# Patient Record
Sex: Female | Born: 1935
Health system: Southern US, Community
[De-identification: ages and names within clinical notes are randomized; demographics above are authoritative.]

## PROBLEM LIST (undated history)

## (undated) DIAGNOSIS — I5181 Takotsubo syndrome: Secondary | ICD-10-CM

## (undated) DIAGNOSIS — J189 Pneumonia, unspecified organism: Secondary | ICD-10-CM

## (undated) DIAGNOSIS — I2589 Other forms of chronic ischemic heart disease: Secondary | ICD-10-CM

## (undated) DIAGNOSIS — I484 Atypical atrial flutter: Secondary | ICD-10-CM

## (undated) DIAGNOSIS — I639 Cerebral infarction, unspecified: Secondary | ICD-10-CM

## (undated) DIAGNOSIS — I4891 Unspecified atrial fibrillation: Secondary | ICD-10-CM

## (undated) DIAGNOSIS — D638 Anemia in other chronic diseases classified elsewhere: Secondary | ICD-10-CM

## (undated) DIAGNOSIS — J90 Pleural effusion, not elsewhere classified: Secondary | ICD-10-CM

## (undated) DIAGNOSIS — I429 Cardiomyopathy, unspecified: Secondary | ICD-10-CM

## (undated) DIAGNOSIS — I2510 Atherosclerotic heart disease of native coronary artery without angina pectoris: Secondary | ICD-10-CM

## (undated) DIAGNOSIS — G894 Chronic pain syndrome: Secondary | ICD-10-CM

## (undated) DIAGNOSIS — Z9289 Personal history of other medical treatment: Secondary | ICD-10-CM

## (undated) DIAGNOSIS — E119 Type 2 diabetes mellitus without complications: Secondary | ICD-10-CM

## (undated) DIAGNOSIS — I5032 Chronic diastolic (congestive) heart failure: Secondary | ICD-10-CM

## (undated) DIAGNOSIS — I509 Heart failure, unspecified: Secondary | ICD-10-CM

## (undated) DIAGNOSIS — R0902 Hypoxemia: Secondary | ICD-10-CM

## (undated) DIAGNOSIS — N183 Chronic kidney disease, stage 3 unspecified: Secondary | ICD-10-CM

## (undated) DIAGNOSIS — I5021 Acute systolic (congestive) heart failure: Secondary | ICD-10-CM

## (undated) DIAGNOSIS — I2789 Other specified pulmonary heart diseases: Secondary | ICD-10-CM

## (undated) DIAGNOSIS — I129 Hypertensive chronic kidney disease with stage 1 through stage 4 chronic kidney disease, or unspecified chronic kidney disease: Secondary | ICD-10-CM

## (undated) DIAGNOSIS — I428 Other cardiomyopathies: Secondary | ICD-10-CM

## (undated) DIAGNOSIS — N189 Chronic kidney disease, unspecified: Secondary | ICD-10-CM

## (undated) HISTORY — DX: Acute systolic (congestive) heart failure: I50.21

## (undated) HISTORY — DX: Atypical atrial flutter: I48.4

## (undated) HISTORY — DX: Anemia in other chronic diseases classified elsewhere: D63.8

## (undated) HISTORY — PX: CHOLECYSTECTOMY: SHX55

## (undated) HISTORY — DX: Other forms of chronic ischemic heart disease: I25.89

## (undated) HISTORY — PX: ABDOMINAL HYSTERECTOMY: SHX81

## (undated) HISTORY — DX: Hypertensive chronic kidney disease with stage 1 through stage 4 chronic kidney disease, or unspecified chronic kidney disease: I12.9

## (undated) HISTORY — PX: PATELLA RECONSTRUCTION: SHX736

## (undated) HISTORY — DX: Atherosclerotic heart disease of native coronary artery without angina pectoris: I25.10

## (undated) HISTORY — PX: TONSILLECTOMY: SUR1361

## (undated) HISTORY — DX: Other cardiomyopathies: I42.8

## (undated) HISTORY — DX: Cardiomyopathy, unspecified: I42.9

## (undated) HISTORY — DX: Hypoxemia: R09.02

## (undated) HISTORY — DX: Pleural effusion, not elsewhere classified: J90

## (undated) HISTORY — DX: Cerebral infarction, unspecified: I63.9

## (undated) HISTORY — PX: APPENDECTOMY: SHX54

## (undated) HISTORY — PX: DILATION AND CURETTAGE OF UTERUS: SHX78

## (undated) HISTORY — DX: Chronic pain syndrome: G89.4

## (undated) HISTORY — PX: CATARACT EXTRACTION, BILATERAL: SHX1313

## (undated) HISTORY — DX: Heart failure, unspecified: I50.9

## (undated) HISTORY — DX: Other specified pulmonary heart diseases: I27.89

## (undated) HISTORY — DX: Chronic kidney disease, unspecified: N18.9

---
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
## 1959-05-17 DIAGNOSIS — Z9289 Personal history of other medical treatment: Secondary | ICD-10-CM

## 1959-05-17 HISTORY — DX: Personal history of other medical treatment: Z92.89

## 1998-07-31 ENCOUNTER — Ambulatory Visit (HOSPITAL_COMMUNITY): Admission: RE | Admit: 1998-07-31 | Discharge: 1998-07-31 | Payer: Self-pay | Admitting: *Deleted

## 2000-03-05 ENCOUNTER — Ambulatory Visit (HOSPITAL_COMMUNITY): Admission: RE | Admit: 2000-03-05 | Discharge: 2000-03-05 | Payer: Self-pay | Admitting: Gastroenterology

## 2000-12-30 ENCOUNTER — Emergency Department (HOSPITAL_COMMUNITY): Admission: EM | Admit: 2000-12-30 | Discharge: 2000-12-31 | Payer: Self-pay | Admitting: *Deleted

## 2000-12-30 ENCOUNTER — Encounter: Payer: Self-pay | Admitting: *Deleted

## 2001-01-07 ENCOUNTER — Encounter: Payer: Self-pay | Admitting: Family Medicine

## 2001-01-07 ENCOUNTER — Ambulatory Visit (HOSPITAL_COMMUNITY): Admission: RE | Admit: 2001-01-07 | Discharge: 2001-01-07 | Payer: Self-pay | Admitting: Family Medicine

## 2001-06-18 ENCOUNTER — Ambulatory Visit (HOSPITAL_COMMUNITY): Admission: RE | Admit: 2001-06-18 | Discharge: 2001-06-18 | Payer: Self-pay | Admitting: Family Medicine

## 2001-06-18 ENCOUNTER — Encounter: Payer: Self-pay | Admitting: Family Medicine

## 2002-01-31 ENCOUNTER — Ambulatory Visit (HOSPITAL_COMMUNITY): Admission: RE | Admit: 2002-01-31 | Discharge: 2002-01-31 | Payer: Self-pay | Admitting: Cardiology

## 2003-03-15 ENCOUNTER — Inpatient Hospital Stay (HOSPITAL_COMMUNITY): Admission: EM | Admit: 2003-03-15 | Discharge: 2003-03-17 | Payer: Self-pay | Admitting: Cardiology

## 2003-07-17 ENCOUNTER — Inpatient Hospital Stay (HOSPITAL_COMMUNITY): Admission: RE | Admit: 2003-07-17 | Discharge: 2003-07-18 | Payer: Self-pay | Admitting: Neurosurgery

## 2005-02-05 ENCOUNTER — Ambulatory Visit: Payer: Self-pay | Admitting: Cardiology

## 2005-02-06 ENCOUNTER — Inpatient Hospital Stay (HOSPITAL_COMMUNITY): Admission: AD | Admit: 2005-02-06 | Discharge: 2005-02-07 | Payer: Self-pay | Admitting: Cardiology

## 2005-02-13 ENCOUNTER — Ambulatory Visit: Payer: Self-pay | Admitting: *Deleted

## 2005-02-21 ENCOUNTER — Ambulatory Visit: Payer: Self-pay | Admitting: Cardiology

## 2005-03-06 ENCOUNTER — Ambulatory Visit: Payer: Self-pay | Admitting: Cardiology

## 2005-04-04 ENCOUNTER — Ambulatory Visit: Payer: Self-pay | Admitting: Cardiology

## 2006-05-04 ENCOUNTER — Ambulatory Visit: Payer: Self-pay | Admitting: Cardiology

## 2006-06-08 ENCOUNTER — Inpatient Hospital Stay (HOSPITAL_COMMUNITY): Admission: RE | Admit: 2006-06-08 | Discharge: 2006-06-15 | Payer: Self-pay | Admitting: Orthopedic Surgery

## 2006-06-11 ENCOUNTER — Ambulatory Visit: Payer: Self-pay | Admitting: Physical Medicine & Rehabilitation

## 2006-12-08 ENCOUNTER — Encounter: Admission: RE | Admit: 2006-12-08 | Discharge: 2006-12-08 | Payer: Self-pay | Admitting: Orthopedic Surgery

## 2007-03-04 ENCOUNTER — Ambulatory Visit: Payer: Self-pay | Admitting: Physician Assistant

## 2007-03-05 ENCOUNTER — Ambulatory Visit: Payer: Self-pay | Admitting: Cardiology

## 2007-03-05 ENCOUNTER — Inpatient Hospital Stay (HOSPITAL_BASED_OUTPATIENT_CLINIC_OR_DEPARTMENT_OTHER): Admission: RE | Admit: 2007-03-05 | Discharge: 2007-03-05 | Payer: Self-pay | Admitting: Cardiology

## 2008-04-27 ENCOUNTER — Observation Stay (HOSPITAL_COMMUNITY): Admission: RE | Admit: 2008-04-27 | Discharge: 2008-04-29 | Payer: Self-pay | Admitting: Orthopedic Surgery

## 2009-07-09 ENCOUNTER — Ambulatory Visit (HOSPITAL_COMMUNITY): Admission: RE | Admit: 2009-07-09 | Discharge: 2009-07-09 | Payer: Self-pay | Admitting: Pediatrics

## 2009-07-31 ENCOUNTER — Ambulatory Visit (HOSPITAL_COMMUNITY): Admission: RE | Admit: 2009-07-31 | Discharge: 2009-07-31 | Payer: Self-pay | Admitting: Pediatrics

## 2009-08-25 ENCOUNTER — Ambulatory Visit: Payer: Self-pay | Admitting: Cardiology

## 2009-10-05 ENCOUNTER — Ambulatory Visit (HOSPITAL_COMMUNITY): Admission: RE | Admit: 2009-10-05 | Discharge: 2009-10-05 | Payer: Self-pay | Admitting: Pediatrics

## 2009-10-05 ENCOUNTER — Other Ambulatory Visit: Admission: RE | Admit: 2009-10-05 | Discharge: 2009-10-05 | Payer: Self-pay | Admitting: Pediatrics

## 2010-11-18 ENCOUNTER — Ambulatory Visit: Payer: Self-pay | Admitting: Urgent Care

## 2010-11-22 DIAGNOSIS — I5023 Acute on chronic systolic (congestive) heart failure: Secondary | ICD-10-CM

## 2010-11-24 DIAGNOSIS — J90 Pleural effusion, not elsewhere classified: Secondary | ICD-10-CM

## 2010-11-25 DIAGNOSIS — I428 Other cardiomyopathies: Secondary | ICD-10-CM

## 2010-11-27 DIAGNOSIS — I509 Heart failure, unspecified: Secondary | ICD-10-CM

## 2010-11-28 DIAGNOSIS — I5032 Chronic diastolic (congestive) heart failure: Secondary | ICD-10-CM

## 2010-12-02 ENCOUNTER — Ambulatory Visit: Payer: Self-pay | Admitting: Gastroenterology

## 2010-12-02 ENCOUNTER — Encounter: Payer: Self-pay | Admitting: Cardiology

## 2010-12-04 ENCOUNTER — Encounter: Payer: Self-pay | Admitting: *Deleted

## 2010-12-04 ENCOUNTER — Encounter: Payer: Self-pay | Admitting: Cardiology

## 2010-12-04 ENCOUNTER — Ambulatory Visit (INDEPENDENT_AMBULATORY_CARE_PROVIDER_SITE_OTHER): Payer: Medicare Other | Admitting: *Deleted

## 2010-12-04 DIAGNOSIS — I420 Dilated cardiomyopathy: Secondary | ICD-10-CM | POA: Insufficient documentation

## 2010-12-04 DIAGNOSIS — I2589 Other forms of chronic ischemic heart disease: Secondary | ICD-10-CM

## 2010-12-04 DIAGNOSIS — I255 Ischemic cardiomyopathy: Secondary | ICD-10-CM

## 2010-12-04 DIAGNOSIS — I959 Hypotension, unspecified: Secondary | ICD-10-CM | POA: Insufficient documentation

## 2010-12-04 DIAGNOSIS — I272 Pulmonary hypertension, unspecified: Secondary | ICD-10-CM | POA: Insufficient documentation

## 2010-12-04 DIAGNOSIS — I2510 Atherosclerotic heart disease of native coronary artery without angina pectoris: Secondary | ICD-10-CM | POA: Insufficient documentation

## 2010-12-04 DIAGNOSIS — I509 Heart failure, unspecified: Secondary | ICD-10-CM

## 2010-12-04 DIAGNOSIS — I2789 Other specified pulmonary heart diseases: Secondary | ICD-10-CM

## 2010-12-04 DIAGNOSIS — R9431 Abnormal electrocardiogram [ECG] [EKG]: Secondary | ICD-10-CM

## 2010-12-04 MED ORDER — FUROSEMIDE 40 MG PO TABS: 40.0000 mg | ORAL_TABLET | Freq: Every day | ORAL | Status: DC

## 2010-12-04 MED ORDER — DIGOXIN 125 MCG PO TABS: 125.0000 ug | ORAL_TABLET | Freq: Every day | ORAL | Status: DC

## 2010-12-04 MED ORDER — CARVEDILOL 3.125 MG PO TABS: 3.1250 mg | ORAL_TABLET | Freq: Two times a day (BID) | ORAL | Status: DC

## 2010-12-04 NOTE — Assessment & Plan Note (Addendum)
Patient is stable.  She is doing much better after intravenous dobutamine.  However she has an end-stage cardiomyopathy and at this point in time we will continue to follow her optimize her medications.  I do not think that she is a particular good candidate for ICD given the fact that she had a recent heart failure exacerbation in his NYHA class 4.  Her EKG Also does not qualify her for CRT P.

## 2010-12-04 NOTE — Assessment & Plan Note (Addendum)
Will check orthostatics.  Although the patient is not orthostatic she is complaining of dizziness and I have decreased her dose of lisinopril to once a day.

## 2010-12-04 NOTE — Assessment & Plan Note (Addendum)
Volume status stable.  Currently no change in medications.  Continue current dose of Lasix.  The patient will also be set up for home health in case changes in her diuretic regimen need to be obtained.  She also needs daily weights at home.

## 2010-12-04 NOTE — Progress Notes (Signed)
HPI The patient is a 77 year old female who was admitted to [facility] with nausea and vomiting secondary to low output state.  The patient received intravenous dobutamine.  She had a very good response to this.  She presented with acute on chronic renal insufficiency.  Her creatinine improved from 2.54 to 1.54. The patient has a mixed ischemic/nonischemic cardiomyopathy by catheterization in 2008.  Her current ejection fraction is 20 to 25%.  Her Cardiolite study done showed moderately severe decreased activity in the inferior wall with no reversibility consistent with an old inferior wall myocardial infarction.  There was also decreased activity in the mid anterior wall and entire apex with no reversibility.  Overall the study shows old scar affecting the inferior and anterior apical wall with no significant ischemia. The patient has a significant abnormal baseline electrocardiogram T-wave inversions in the inferior and anterolateral leads. Of note is that the patient also had a gastric emptying study done for nausea which was negative.  Ventilation-perfusion scan was low probability of pulmonary embolism. Echocardiogram also showed significant pulmonary hypertension with PA systolic pressure of 70 to 75 mm of mercury. The patient presents now for follow-up.  Plan was for home health to draw a electrolyte panel but this was never done.  Also a number of her medications on discharge were incorrect and she was not taking some cardiac medications be recommended.  Although the patient is still weak she has improved significantly.  Her blood pressure however is the low side likely due to the fact that she is a very high dose of lisinopril which is ultimately recommended that.  The patient will be checked for orthostatics today.  No Known Allergies  No current outpatient prescriptions on file prior to visit.    No past medical history on file.  No past surgical history on file.  No family history  on file.  History   Social History  . Marital Status: Married    Spouse Name: N/A    Number of Children: N/A  . Years of Education: N/A   Occupational History  . Not on file.   Social History Main Topics  . Smoking status: Never Smoker   . Smokeless tobacco: Not on file  . Alcohol Use: No  . Drug Use: No  . Sexually Active: Not on file   Other Topics Concern  . Not on file   Social History Narrative  . No narrative on file   Review of systems The patient still reports some weakness and fatigue.She is less short of breath rashes or gaining her strength.  She has no palpitations or syncope orthopnea or PND.  The remainder of the aching point review of systems is otherwise within normal limits.  PHYSICAL EXAM BP 99/68  Pulse 69  Ht 5\' 2"  (1.575 m)  Wt 137 lb (62.143 kg)  BMI 25.06 kg/m2  SpO2 98% General: Well-developed, well-nourished in no distress Head: Normocephalic and atraumatic Eyes:PERRLA/EOMI intact, conjunctiva and lids normal Ears: No deformity or lesions Mouth:normal dentition, normal posterior pharynx Neck: Supple, no JVD.  No masses, thyromegaly or abnormal cervical nodes Lungs: Normal breath sounds bilaterally without wheezing.  Normal percussion Cardiac: regular rate and rhythm with normal S1 and S2, no S3 or S4.  PMI is normal.  No pathological murmurs Abdomen: Normal bowel sounds, abdomen is soft and nontender without masses, organomegaly or hernias noted.  No hepatosplenomegaly MSK: Back normal, normal gait muscle strength and tone normal Vascular: Pulse is normal in all 4 extremities  Extremities: No peripheral pitting edema Neurologic: Alert and oriented x 3 Skin: Intact without lesions or rashes Lymphatics: No significant adenopathyPsychologic: Normal affect  [id number] sinus rhythm, marked ST-T wave changes in inferior and anterior leads which are old finding.  ASSESSMENT AND PLAN

## 2010-12-04 NOTE — Patient Instructions (Signed)
Home Health - deferred to primary MD Labs:  BMET today Cardiac Medications:     Digoxin 0.125mg  - every other day   Lisinopril 2.5mg  daily   Lasix 40mg  daily   Carvedilol 3.125mg  - twice a day  Follow up in  3-4 weeks.

## 2010-12-04 NOTE — Assessment & Plan Note (Signed)
Will check electrolyte panel.

## 2010-12-06 ENCOUNTER — Other Ambulatory Visit: Payer: Self-pay | Admitting: Cardiology

## 2010-12-06 ENCOUNTER — Other Ambulatory Visit: Payer: Self-pay | Admitting: *Deleted

## 2010-12-06 DIAGNOSIS — E875 Hyperkalemia: Secondary | ICD-10-CM

## 2010-12-12 ENCOUNTER — Telehealth: Payer: Self-pay | Admitting: *Deleted

## 2010-12-12 NOTE — Telephone Encounter (Signed)
Left message for daughter to call back regarding test results.

## 2010-12-12 NOTE — Consult Note (Signed)
Summary: [facility] PROGRESS NOTE  [facility]IOLOGY[facility] PROGRESS NOTE   Imported By: Delfino Lovett 12/02/2010 15:05:23  _____________________________________________________________________  External Attachment:    Type:   Image     Comment:   External Document

## 2010-12-12 NOTE — Telephone Encounter (Signed)
Message copied by Lovina Reach on Thu Dec 12, 2010  3:01 PM ------      Message from: Terald Sleeper      Created: Wed Dec 11, 2010  1:44 PM       Continue to hold lisinopril

## 2010-12-13 ENCOUNTER — Encounter: Payer: Self-pay | Admitting: *Deleted

## 2010-12-13 NOTE — Telephone Encounter (Signed)
Patients daughter notified of results on BMET.  She is aware to continue to hold her Lisinopril.

## 2010-12-17 ENCOUNTER — Ambulatory Visit (INDEPENDENT_AMBULATORY_CARE_PROVIDER_SITE_OTHER): Payer: Self-pay | Admitting: Internal Medicine

## 2011-01-03 ENCOUNTER — Ambulatory Visit (INDEPENDENT_AMBULATORY_CARE_PROVIDER_SITE_OTHER): Payer: BC Managed Care – PPO | Admitting: Cardiology

## 2011-01-03 ENCOUNTER — Encounter: Payer: Self-pay | Admitting: Cardiology

## 2011-01-03 VITALS — BP 89/56 | HR 61

## 2011-01-03 DIAGNOSIS — I420 Dilated cardiomyopathy: Secondary | ICD-10-CM

## 2011-01-03 DIAGNOSIS — I959 Hypotension, unspecified: Secondary | ICD-10-CM

## 2011-01-03 DIAGNOSIS — I272 Pulmonary hypertension, unspecified: Secondary | ICD-10-CM

## 2011-01-03 DIAGNOSIS — I2510 Atherosclerotic heart disease of native coronary artery without angina pectoris: Secondary | ICD-10-CM

## 2011-01-03 DIAGNOSIS — R112 Nausea with vomiting, unspecified: Secondary | ICD-10-CM | POA: Insufficient documentation

## 2011-01-03 DIAGNOSIS — I428 Other cardiomyopathies: Secondary | ICD-10-CM

## 2011-01-03 DIAGNOSIS — I2789 Other specified pulmonary heart diseases: Secondary | ICD-10-CM

## 2011-01-03 NOTE — Patient Instructions (Signed)
Continue all current medications. Your physician wants you to follow up in:  3 months.  You will receive a reminder letter in the mail one-two months in advance.  If you don't receive a letter, please call our office to schedule the follow up appointment

## 2011-01-03 NOTE — Assessment & Plan Note (Signed)
The patient also in the past that had abnormal Cardiolite studies consistent with false positive studies. I think for the most part she does not have ischemic heart disease. She currently denies any chest pain and no further workup is required.

## 2011-01-03 NOTE — Assessment & Plan Note (Signed)
Creatinine has remained stable around 1.6. However we did not restart lisinopril due to the fact that her blood pressure is relatively low.

## 2011-01-03 NOTE — Assessment & Plan Note (Signed)
Lisinopril and Aldactone have been put on hold. Although blood pressures relatively low the patient is asymptomatic. Aldactone should not be restarted due to associated hyperkalemia.

## 2011-01-03 NOTE — Progress Notes (Signed)
HPI The patient is a 77 year old female was recently admitted to [facility] with nausea and vomiting. She was ruled out for gastroparesis. She had a negative GERD. It was felt that the right lower lobe infiltrate consistent with pneumonia. The patient has had multiple cardiac catheterizations in the past her most recent one in 2008 which showed no significant coronary artery disease. In the past she does have normal LV function. However during his hospitalization on admission her heart function was found to be severely depressed. She had new onset LV dysfunction with an ejection fraction of 20-25%. She was also found to have severe pulmonary hypertension with PA systolic pressures of 123XX123 mm of mercury. In addition she had developed acute on chronic renal insufficiency with a creatinine that went as high as 2.54. However upon discharge her creatinine was 1.5. The patient's was transferred to the ICU for intravenous dobutamine. I should mention also that the patient had a recent Cardiolite study done which showed inferior scar but no ischemia. She was ruled out for pulmonary embolism and had a negative ventilation perfusion scan. She also had a pleural effusion during this admission. The patient improved with intravenous dobutamine with improvement in her creatinine and symptoms of low output. It was eventually felt that her nausea and vomiting was secondary to low output symptoms. The patient's BNP level improved from 1597 to 364. Multiple medications changes were made to digoxin was added at 0.125 mg a day, lisinopril 2.5 g by mouth twice a day, Lasix 40 mg a day, spironolactone 25 mg a day and carvedilol 3.125 g by mouth twice a day. Upon discharge we ordered followup labs in particular to followup with her creatinine. Of note is also the patient had an anemia and had fecal occult blood screen that was positive. However iron parameters were more consistent with anemia of chronic disease/inflammatory  anemia. Followup blood work however showed that the patient had a creatinine of 1.64 and a potassium of 5.4 and therefore we made a decision to hold the lisinopril and spironolactone. Followup laboratory work showed a potassium of 4.6 and stable. I did a bedside echocardiogram today in the office and the patient's ejection fraction has dramatically improved to 40/45%. This is a significant change from her recent hospitalization. I'm not sure if the patient could have had aTacko-tsubo syndrome although there is no history to support any risk factors for this condition.  No Known Allergies  Current Outpatient Prescriptions on File Prior to Visit  Medication Sig Dispense Refill  . carvedilol (COREG) 3.125 MG tablet Take 1 tablet (3.125 mg total) by mouth 2 (two) times daily with a meal.  60 tablet  6  . digoxin (LANOXIN) 0.125 MG tablet Take 1 tablet (125 mcg total) by mouth daily.  30 tablet  11  . FLUoxetine (PROZAC) 20 MG capsule Take one by mouth three times daily       . furosemide (LASIX) 40 MG tablet Take 1 tablet (40 mg total) by mouth daily.  30 tablet  11  . glipiZIDE (GLUCOTROL) 5 MG tablet Take one by mouth daily       . insulin detemir (LEVEMIR) 100 UNIT/ML injection Inject 14 Units into the skin at bedtime.       Marland Kitchen oxybutynin (DITROPAN) 5 MG tablet Take one by mouth twice daily       . oxyCODONE (OXYCONTIN) 10 MG 12 hr tablet Take 1-2 by mouth four times daily as needed for severe pain        .  PLAVIX 75 MG tablet Take one by mouth daily       . prochlorperazine (COMPAZINE) 5 MG tablet Take 5 mg by mouth every 6 (six) hours as needed.       . simvastatin (ZOCOR) 20 MG tablet Take one by mouth daily       . DISCONTD: lisinopril (PRINIVIL,ZESTRIL) 2.5 MG tablet Take one by mouth daily        . DISCONTD: spironolactone (ALDACTONE) 25 MG tablet Take one by mouth daily         Past Medical History  Diagnosis Date  . Unspecified pleural effusion   . Acute systolic heart failure   .  Other primary cardiomyopathies   . Congestive heart failure, unspecified   . Other specified forms of chronic ischemic heart disease   . Coronary atherosclerosis of native coronary artery   . Unspecified hypertensive kidney disease with chronic kidney disease stage I through stage IV, or unspecified   . Type II or unspecified type diabetes mellitus with neurological manifestations, uncontrolled   . Chronic kidney disease, unspecified   . Anemia of other chronic disease   . Other chronic pulmonary heart diseases   . Hypoxemia   . Chronic pain syndrome     Past Surgical History  Procedure Date  . Cholecystectomy   . Appendectomy   . Abdominal hysterectomy   . Bilateral knee surgery   . Cataract extraction, bilateral   . Tonsillectomy     Family History  Problem Relation Age of Onset  . Heart attack Father 72    MI    History   Social History  . Marital Status: Married    Spouse Name: N/A    Number of Children: N/A  . Years of Education: N/A   Occupational History  . RETIRED    Social History Main Topics  . Smoking status: Never Smoker   . Smokeless tobacco: Not on file  . Alcohol Use: No  . Drug Use: No  . Sexually Active: Not on file   Other Topics Concern  . Not on file   Social History Narrative  . No narrative on file    review of systems:Pertinent positives as outlined above. The remainder of the 18  point review of systems is negative   PHYSICAL EXAM BP 89/56  Pulse 61  General: Well-developed, well-nourished in no distress Head: Normocephalic and atraumatic Eyes:PERRLA/EOMI intact, conjunctiva and lids normal Ears: No deformity or lesions Mouth:normal dentition, normal posterior pharynx Neck: Supple, no JVD.  No masses, thyromegaly or abnormal cervical nodes Lungs: Normal breath sounds bilaterally without wheezing.  Normal percussion Cardiac: regular rate and rhythm with normal S1 and S2, no S3 or S4.  PMI is normal.  No pathological  murmurs Abdomen: Normal bowel sounds, abdomen is soft and nontender without masses, organomegaly or hernias noted.  No hepatosplenomegaly MSK: Back normal, normal gait muscle strength and tone normal Vascular: Pulse is normal in all 4 extremities Extremities: No peripheral pitting edema Neurologic: Alert and oriented x 3, generalized weakness Skin: Intact without lesions or rashes Lymphatics: No significant adenopathy Psychologic: Normal affect   ECG: Not available  ASSESSMENT AND PLAN

## 2011-01-03 NOTE — Assessment & Plan Note (Signed)
Possibly Tacko-tsubo cardiomyopathy. During her last admission the patient was given intravenous dobutamine. I did a bedside echocardiogram today and it demonstrates that the patient's LV function has dramatically improved to 40-45%. She can continue on her current medical regimen.

## 2011-01-28 NOTE — Assessment & Plan Note (Signed)
[place] OFFICE NOTE   NAME:[name], Norma MALONY                      MRN:          [id number]  DATE:03/04/2007                            DOB:          08-10-1934    CARDIOLOGIST:  Dr. Dannielle Burn.   PRIMARY CARE PHYSICIAN:  Dr. Jerene Bears.   HISTORY OF PRESENT ILLNESS:  Norma [name] is a 77 year old female patient  with a history of non obstructive coronary artery disease by cardiac  catheterization. She has actually had multiple cardiac catheterization  dating back to 1999. She has had chronic chest pain since that time as  well. In total she has had 4 cardiac catheterizations all revealing non  obstructive coronary artery disease. She had been treated with Imdur in  the past but that was discontinued in 2006. She last saw Norma Serpe, PA-  C in the office on May 04, 2006. She noted chronic stable angina  pectoris at that time and she was cleared for upcoming knee surgery. The  knee surgery apparently went well. She was scheduled for lumbar spine  surgery recently, however she noted increasing chest pain and her  surgery was postponed. She presents to the office today for cardiac  clearance. She notes that she gets left-sided chest pressure underneath  her breast, mainly with exertion. This happens with any type of activity  she does. She sometimes has to lie down for the entire day and not do  anything to remain pain free. She has had on occasion chest pain at  rest. However, as noted previously this has mainly been with exertion.  She notes associated shortness of breath. She also notes associated  nausea. There is no diaphoresis or syncope. She denies any radiation to  her neck or arm, but does feel some radiation to her back from time to  time. She denies orthopnea, paroxysmal nocturnal dyspnea, pedal edema,  or palpitations.   MEDICATIONS:  1. Toprol XL 50 mg daily.  2. Aspirin 81 mg daily.  3. NovoLog  insulin 70/30 as directed.  4. Metformin 1 gram b.i.d.  5. Xanax 0.5 mg at bedtime.  6. Hydrocodone p.r.n.   ALLERGIES:  No known drug allergies.   SOCIAL HISTORY:  She denies any tobacco abuse.   FAMILY HISTORY:  Significant for coronary artery disease. Her father  died of myocardial infarction at age 28.   REVIEW OF SYSTEMS:  Please see HPI. Denies any fevers, chills, cough. No  melena, hematochezia, hematuria, dysuria. The rest of review of systems  is negative.   PHYSICAL EXAMINATION:  She is a well-nourished, well-developed female in  no acute distress. Blood pressure 154/84, pulse 80, weight 195.6 pounds.  HEENT: Normal.  NECK:  Without JVD.  ENDOCRINE: Without thyromegaly. Carotids without bruits bilaterally.  CARDIAC: Normal S1, S2. Regular rate and rhythm without murmurs.  LUNGS: Clear to auscultation bilaterally without wheezing, rhonchi, or  rales.  ABDOMEN: Soft, nontender, with normal bowel sounds. No organomegaly.  EXTREMITIES: Without edema. Calves soft, nontender.  SKIN: Warm and dry.  NEUROLOGIC: She is alert and oriented x3. Cranial nerves II-XII  grossly  intact. Femoral artery pulses are 2+ bilaterally without bruits.   Electrocardiogram reveals sinus rhythm with a heart rate of 61. Normal  axis. No acute changes.   IMPRESSION:  1. Chest discomfort, consistent with exertional angina pectoris.  2. Non obstructive coronary artery disease by cardiac catheterization.      a.     Multiple cardiac catheterizations in the past.      b.     Last cardiac catheterization May of 2006: Mid LAD 40% to       50%, first diagonal 30% to 40% discrete lesions,circumflex 20%,       multiple discrete lesions proximally, AV grooves circumflex 60%       ostial stenosis, RCA 20% to 30% multiple discrete lesions of the       proximal mid vessel, PDA 30% multiple discrete lesions.  3. Preserved left ventricular function EF 60% at cardiac      catheterization 2006.  4. Diabetes  mellitus.  5. Hypertension.  6. Hyperlipidemia, untreated.  7. Lumbar spine disease.      a.     Needs surgery.  8. Prior history of TIA.  9. Prior history of abnormal chest CT.      a.     Right base air cavity with 2 small nodular densities by CT       scan July 2004.  10.Osteoarthritis.      a.     Status post bilateral knee replacements in the past.   PLAN:  Patient presents to the office today for preoperative clearance  for upcoming back surgery. She actually describes symptoms of her  exertional angina pectoris. This seems to have gotten worse over the  last several weeks. She has no changes on her EKG today. She has had non  obstructive coronary disease in the past. There has been a concern in  the past that she may have microvascular disease contributing to some of  her symptoms. Unfortunately, she is not on a calcium channel blocker or  nitrate at this point in time. I have recommended we go ahead get her on  Norvasc 5 mg a day.  I have also given her a prescription for p.r.n.  nitroglycerine. She is to remain on her aspirin. We will get her set up  for cardiac catheterization in the outpatient lab in [place].  Hopefully this can be done tomorrow or Monday of next week. She knows to  go to the emergency room should she have any worsening of symptoms, or  change in her symptoms. I discussed the above assessment and plan today  with Dr. Dannielle Burn  who agreed. We will see her back after her cardiac catheterization and  make final recommendations regarding surgery.      Richardson Dopp, PA-C  Electronically Signed      Ernestine Mcmurray, MD,FACC  Electronically Signed   SW/MedQ  DD: 03/04/2007  DT: 03/04/2007  Job #: [id number]   cc:   Jerene Bears

## 2011-01-28 NOTE — Cardiovascular Report (Signed)
NAMELAENA, [name] NO.:  [id number]   MEDICAL RECORD NO.:  [id number]          PATIENT TYPE:  OIB   LOCATION:  1963                         FACILITY:  [facility]   PHYSICIAN:  Ethelle Lyon, MD  DATE OF BIRTH:  [birth year]   DATE OF PROCEDURE:  03/05/2007  DATE OF DISCHARGE:  03/05/2007                            CARDIAC CATHETERIZATION   PROCEDURES:  1. Left heart catheterization.  2. Left ventriculography.  3. Coronary angiography.  4. Abdominal aortography.   INDICATIONS:  [name] is a 77 year old woman who has a long history  of noncardiac chest pain.  She has had multiple cardiac catheterizations  dating back to 1999.  A question has been raised of coronary spasm in  the past.  She is now contemplating lumbar spinal surgery and has had  more chest pain than usual.  Based on this, she was referred for a  diagnostic angiography.   PROCEDURAL TECHNIQUE:  Informed consent was obtained.  Under 1%  lidocaine local anesthesia, a 4-French sheath was placed in the right  common femoral artery using the modified Seldinger technique.  Diagnostic angiography and ventriculography were performed using JL-4,  JR-4, and pigtail catheters.  The pigtail catheter was then pulled back  to the suprarenal abdominal aorta.  Abdominal aortography was performed  by power injection.  The sheath was then removed and the patient  transferred to the holding room in stable condition, having tolerated  the procedure well.   COMPLICATIONS:  None.   FINDINGS:  1. LV:  183/8/15.  EF 65% without regional wall motion abnormality.  2. No aortic stenosis or mitral regurgitation.  3. Renal arteries:  Single vessels bilaterally, both of which are      normal.  4. Left main:  Angiographically normal.  5. LAD:  Moderate-sized vessel giving rise to a small first and      moderate-sized second diagonal.  There are minor luminal      irregularities along the course of the vessel.  6.  Circumflex:  Moderate size nondominant vessel.  There is a 30%      stenosis proximally.  The distal circumflex has a 70% stenosis in a      segment that is well under 2-mm in diameter.  7. RCA:  Moderate-sized dominant vessel.  The proximal vessel is      calcified.  There are only minor luminal irregularities.   IMPRESSION/RECOMMENDATIONS:  The patient has moderate nonobstructive  coronary disease with the most severe lesion being a 70% stenosis of the  distal circumflex.  The vessel in this region is well under 2-mm in  diameter.   I suggest medical therapy for this.  Based on these findings, she should  be a low risk of major cardiac complication with her upcoming surgery.  She will resume her metformin in 2 days' time.  All other medications  were resumed today.      Ethelle Lyon, MD  Electronically Signed     WED/MEDQ  D:  03/05/2007  T:  03/05/2007  Job:  [id number]

## 2011-01-28 NOTE — Op Note (Signed)
Norma [name], Norma [name]               ACCOUNT NO.:  [id number]   MEDICAL RECORD NO.:  [id number]          PATIENT TYPE:  AMB   LOCATION:  DAY                          FACILITY:  [facility]   PHYSICIAN:  Kipp Brood. Gioffre, M.D.DATE OF BIRTH:  [birth year]   DATE OF PROCEDURE:  04/27/2008  DATE OF DISCHARGE:                               OPERATIVE REPORT   SURGEON:  Kipp Brood. Gladstone Lighter, M.D.   ASSISTANT:  Susa Day, M.D.   PREOPERATIVE DIAGNOSES:  1. Moderate spinal stenosis at L3-4.  2. Severe spinal stenosis at L4-5.  3. Moderate spinal stenosis at L5-S1.  4. Questionable herniated disc versus severe recess overgrowth at L4-5      on the left.   POSTOPERATIVE DIAGNOSES:  1. Moderate spinal stenosis at L3-4.  2. Severe spinal stenosis at L4-5.  3. Moderate spinal stenosis at L5-S1.  4. Recess stenosis on the left at L4-5, and there was no herniated      disc.   PROCEDURE:  Under general anesthesia, routine orthopedic prep and  draping of the back was carried out.  She had 2 g of IV Ancef preop.  At  this time, two needles placed in the back for localization purposes.  X-  ray was taken.  An incision then was made over the L3-4, L4-5, and L5-S1  space.  Bleeders were identified and cauterized.  Muscle was stripped  from the lamina and spinous process bilaterally.  Self-retaining  retractors were inserted.  Another x-ray was taken.  We took several x-  rays to verify the exact position of the L4-5 space.  Once we localized  the appropriate spaces, we removed the spinous process totally of L4.  We removed a portion of L4-3 and another portion of L5.  We started out  at L4-5, where the most severe stenosis was and did a complete central  decompression.  We went far out laterally, decompressed the lateral  recesses, and looked that the disc.  An instrument was placed at the  disc space level.  There was no soft herniated disc.  This was more of  an overgrowth of the that caused the  indentation.  We did decompress  this laterally and went out and did foraminotomies at that level as  well.  Then, we proceeded proximally until we had a good solid opening  in the canal, and that took [place] up to about L3-4.  We then went down  distally, did a hemilaminectomy of L5, and went down distally until we  had good freedom of the dura distally.  We were able to easily pass a  hockey-stick out now distally and proximally without any compression of  the back, and were able to go out into the foramina as well.  So,  basically this covered like a total of two levels.  We thoroughly  irrigated out the area, then inserted 10 mL of FloSeal, and then loosely  applied some thrombin-soaked Gelfoam laterally, and then  closed the wound in layers in the usual fashion, except I left the deep  distal portion part of that wound open  for drainage purposes.  The  remaining part of the wound was closed in the usual fashion, the skin  with metal staples, and a sterile Neosporin dressing applied.           ______________________________  Kipp Brood Gladstone Lighter, M.D.     RAG/MEDQ  D:  04/27/2008  T:  04/27/2008  Job:  [id number]   cc:   Cora Daniels, FNP

## 2011-01-28 NOTE — H&P (Signed)
Norma [name], Norma [name]               ACCOUNT NO.:  [id number]   MEDICAL RECORD NO.:  [id number]          PATIENT TYPE:   LOCATION:                                 FACILITY:   PHYSICIAN:  Kipp Brood. Gioffre, M.D.DATE OF BIRTH:  [birth year]   DATE OF ADMISSION:  02/18/2007  DATE OF DISCHARGE:                              HISTORY & PHYSICAL   CHIEF COMPLAINT:  Lower back and bilateral leg pain.   HISTORY OF PRESENT ILLNESS:  The patient is a 77 year old female here  today for preadmission history and physical.  She has been having some  significant lower back pain that goes down into both legs with activity,  limiting her activities, it is quite painful and she would like to  proceed with further correction of this issue.  Evaluation found that  she had significant spinal stenosis at L4-L5 and L5-S1 and will require  a decompressive lumbar laminectomy L4-L5 and L5-S1 with foraminotomies  bilaterally and evaluation of the disk at L4-5 on the left.   ALLERGIES:  NO KNOWN DRUG ALLERGIES.   CURRENT MEDICATIONS:  1. Insulin 70/30.  2. Xanax.  3. Vicodin.  4. Glucophage.  5. Toprol.   PAST MEDICAL HISTORY:  1. Hypertension.  2. Diabetes.  3. History of hiatal hernia.  4. History of urinary incontinence with a recent urinary tract      infection.  5. History of a TIA 15 years previous completely resolved.   PAST SURGICAL HISTORY:  1. Cholecystectomy.  2. Hysterectomy.  3. Bladder tack.  4. Kidney procedure.  5. Cervical surgery.  6. Patellar repairs bilaterally.  The patient denies any complications above-mentioned surgical  procedures.   FAMILY MEDICAL HISTORY:  Mother is deceased from old age.  Father is  deceased from complications of MI.   SOCIAL HISTORY:  The patient is married, retired, lives with her husband  in a two-story house.   PRIMARY CARE PHYSICIAN:  Dr. Laurena Spies in [place], [place].   PHYSICAL EXAM:  GENERAL:  The patient is a healthy-appearing short-  statured, obese female conscious, alert and appropriate.  She does  appear to be uncomfortable.  She does require some assistance getting  out of the chair standing up.  VITAL SIGNS: Height is 5 feet 2 inches, weight is 190, blood pressure  initially was 180/100, rechecked 5 minutes was 170/90, pulse of 72 and  regular, respirations 12, patient is afebrile.  HEENT: Head was normocephalic.  Pupils equal, reactive.  Extraocular  motions intact.  Gross hearing is intact.  NECK:  Supple.  No palpable lymphadenopathy.  Good range of motion.  CHEST:  Lung sounds were clear and equal bilaterally.  No wheezes,  rales, rhonchi.  HEART: Regular rate and rhythm murmurs, rubs or gallops.  ABDOMEN:  Round, obese, soft, nontender.  Bowel sounds present.  EXTREMITIES:  Upper extremities.  The patient had fairly good range of  motion of shoulders, elbows and wrists.  Motor strength is 5/5.  LOWER EXTREMITIES:  Right left hip had full extension, flexion up to 120  with 20 degrees internal-external station without any difficulty.  She  was able straight leg raise right and left, flex it back to 110 degrees.  Ankles were symmetrical with good dorsi plantar flexion.  LUMBAR EXAMINATION:  She does have significant painful motion of her  lower back, no spasms across back. Straight leg raises right left is  negative.  She had intact light touch sensation of both extremities. She  has equal motor strength in lower extremities right and left.  BREASTS:  RECTAL:  GENITOURINARY:  Deferred at this time.   IMPRESSION:  1. Severe spinal stenosis L4-5 and L5-S1 with small disk at L4-5 on      the left.  2. Obesity.  3. Hypertension.  4. Diabetes.  5. History of hiatal hernia.  6. History of transient ischemic attack 15 years previous fully      resolved.  7. History of urinary incontinence with recent urinary tract      infection.   PLAN:  Patient has been to her primary care physician and was advised  that  she had an increased risk due to her multiple medical issues.  The  patient would like to proceed with a surgical decompressive lumbar  laminectomy as dictated by Dr. Gladstone Lighter.  The patient will undergo all  routine labs and tests prior to this surgical procedure.      Evert Kohl, P.A.    ______________________________  Kipp Brood Gladstone Lighter, M.D.    RWK/MEDQ  D:  02/02/2007  T:  02/02/2007  Job:  [id number]

## 2011-01-31 NOTE — Cardiovascular Report (Signed)
NAME:  Norma [name], Norma [name]                         ACCOUNT NO.:  [id number]   MEDICAL RECORD NO.:  [id number]                   PATIENT TYPE:  INP   LOCATION:  6525                                 FACILITY:  [facility]   PHYSICIAN:  Loretha Brasil. Lia Foyer, M.D.             DATE OF BIRTH:  [birth year]   DATE OF PROCEDURE:  DATE OF DISCHARGE:                              CARDIAC CATHETERIZATION   INDICATIONS:  The patient is a delightful 77 year old lady who has had  recurrent episodes of chest pain in the past.  She has been studied multiple  times previously.  She actually presented with some chest pain.  Her  electrocardiograms revealed no EKG changes and the enzymes were negative.  However, she underwent catheterization by Scarlett Presto, M.D.  This  revealed some haziness in the mid portion of the left anterior descending  artery.  As a result, she was brought back for possible intravascular  ultrasound and possible percutaneous stenting.  She had been on the ACUITY  protocol but we took her off because of need to access the left groin which  is difficult for her to lie still very long, some bleeding into the right  groin with a moderate hematoma, and also a nose bleed earlier in the day.  She was brought back to the laboratory for reevaluation for possible  intervention.   PROCEDURE:  1. Selective coronary arteriography.   DESCRIPTION OF PROCEDURE:  The patient was brought to the catheterization  laboratory and prepped and draped in the usual fashion.  Through an anterior  puncture the left femoral artery was entered.  The 6-French sheath was then  placed.  6-French guiding catheter 3.5 JL was then taken to the central  aorta and placed in the left main.  Views of the left coronary artery then  obtained in multiple angiographic projections.  In review of the films,  there was a residual 50% stenosis in the mid LAD just after the takeoff of  the major diagonal.  This appeared to be less hazy  and really quite smooth  compared to the previous study.  As a result, we elected not to do  ultrasound nor percutaneous intervention at the present time.  The patient  will be treated with continuous Plavix.   HEMODYNAMIC DATA:  Central aorta 130/67, mean 93.   ANGIOGRAPHIC DATA:  1. The left main coronary artery was free of critical disease.  2. The left anterior descending coursed to the apex.  There is about a 50%     area of segmental plaquing just beyond the major diagonal.  The major     diagonal itself has about 40% narrowing.  In the area of 50% narrowing     this appears to be relatively smooth and not high grade.  As a result,     there is much less haziness compared to the previous study.  I reviewed  these studies with the patient's family.  The distal LAD wrapped the     apex.  3. The circumflex had a 30-40% area of segmental plaquing in the proximal     vessel.  More distally beyond the takeoff of the major marginal was an     80% area of narrowing and a 50-60% area of narrowing.   IMPRESSION:  1. Moderate stenosis of the mid left anterior descending artery with a     substantial improvement in appearance from study one day earlier.  2. Abnormal chest CT.  Please see chart.   PLAN:  1. Continue Plavix 75 mg daily with other medications.  2. Consider pulmonary consultation.  3. Probable Cardiolite on Saturday to assess anterior ischemia.                                               Loretha Brasil. Lia Foyer, M.D.    TDS/MEDQ  D:  03/16/2003  T:  03/17/2003  Job:  [place]  [place] [id number]  Fax: [phone]   Ernestine Mcmurray, M.D.  [address]  [place] [address]   CV Lab   Scarlett Presto, M.D.  Fax: [phone]   cc:   Jerene Bears  [address]  [place]  [place] [address]  Fax: [phone]   Ernestine Mcmurray, M.D.  [phone]  [place] [address]   CV Lab   Scarlett Presto, M.D.  Fax: [phone]

## 2011-01-31 NOTE — Discharge Summary (Signed)
Norma [name], Norma [name]               ACCOUNT NO.:  [id number]   MEDICAL RECORD NO.:  [id number]          PATIENT TYPE:  INP   LOCATION:  [id number]                         FACILITY:  [facility]   PHYSICIAN:  Satira Sark, M.D. LHCDATE OF BIRTH:  21-Jul-1934   DATE OF ADMISSION:  02/06/2005  DATE OF DISCHARGE:                                 DISCHARGE SUMMARY   PROCEDURES:  1.  Cardiac catheterization.  2.  Coronary arteriogram.  3.  Left ventriculogram.   DISCHARGE DIAGNOSES:  1.  Chest pain, status post catheterization this admission with      nonobstructive disease including a 60% circumflex in the AV groove, 40-      50% mid-LAD, and 20-30% RCA.  2.  Preserved left ventricular function with an EF of 60% no wall motion      abnormality to __________ .  3.  Diabetes.  4.  Hypertension.  5.  Hyperlipidemia.  6.  Family history of coronary artery disease.  7.  Obesity.  8.  Chronic low back pain.  9.  History of cervical disk disease.  10. Status post total abdominal hysterectomy.  11. Previous remote transient ischemic attack.   HOSPITAL COURSE:  Norma [name] is a 77 year old female with a history of  cardiac catheterization in 2003 showing less than 30%  nonobstructive  disease. She was having exertional chest tightness and evaluated at [facility] by Dr. Ron Parker. It was felt that she needed catheterization. She was  transferred to [facility]   The cardiac catheterization showed nonobstructive disease, as described  above, and medical therapy was recommended. Dr. Johnsie Cancel recommended  consideration of starting an ACE inhibitor and a statin, but deferred this  to outpatient management.   Post catheterization Norma [name] is without chest pain or shortness of  breath. Her Glucophage as on hold and she is to continue her other  medications as prescribed. If her groin remains stable with ambulation, she  is tentatively considered stable for discharge on 02/08/2004.   DISCHARGE INSTRUCTIONS:  1.  Activity level is to include no driving or strenuous activity for 2      days.  2.  She is to stick to a low-fat diabetic diet.  3.  She is to call our office for problems with catheterization site. She is      to see __________ , PA-C on June 9 at 1:45.  4.  She is to follow up with her family physician, as needed, or as      scheduled.   DISCHARGE MEDICATIONS:  1.  Toprol XL 50 milligrams q.h.s.  2.  Lortab 5/500 as prior to admission.  3.  Xanax as prior to admission.  4.  Glucophage 1000 mg b.i.d., restart 02/10/2005.  5.  Novolin insulin 70/30, 28 units b.i.d.  6.  Aspirin 81 mg every day.      RB/MEDQ  D:  02/07/2005  T:  02/07/2005  Job:  [id number]   cc:   [facility] [place]  [address]  [place]  [place] [address]  Fax: [phone]

## 2011-01-31 NOTE — Discharge Summary (Signed)
NAME:  Norma [name], Norma [name] NO.:  [id number]   MEDICAL RECORD NO.:  [id number]                   PATIENT TYPE:  INP   LOCATION:  6525                                 FACILITY:  [facility]   PHYSICIAN:  Sueanne Margarita, P.A.              DATE OF BIRTH:  [birth year]   DATE OF ADMISSION:  DATE OF DISCHARGE:  03/17/2003                           DISCHARGE SUMMARY - REFERRING   DISCHARGE DIAGNOSES:  Unstable angina with the finding of 3-vessel coronary  artery disease, enrolled in the ACUITY study on admission. Received  Integrilin/Lovenox.   PROCEDURES:  1. Left heart catheterization March 15, 2003. The left main had luminal     irregularities. The left anterior descending had a 50% stenosis after the     2nd diagonal which appeared hazy with TIMI 2 flow, worrisome for     occlusive lesion. In the left circumflex the posterolateral branch had     serial 99% stenoses. The right coronary artery had a 25% proximal     stenosis, 25% midpoint stenosis, 25% stenosis in a large posterior     descending artery. The ejection fraction was 65%, no wall motion     abnormalities, no mitral regurgitation.  2. March 16, 2003, relook left heart catheterization. Left anterior descending     coronary artery improved aspect without evident thrombus. The patient is     to continue on Plavix. Cardiolite study scheduled in [place] with follow up     with Dr. Dannielle Burn.  3. Air cavities in the right lung base. Consulted Dr. Joya Gaskins, pulmonology,     most probably chronic pneumatocele. Dr. Joya Gaskins will follow up with the     patient.   SECONDARY DIAGNOSES:  1. History of lacunar infarct by old CT scan.  2. Type 2 diabetes mellitus.  3. Hypertension.  4. Cholesterol status unknown, fasting lipid profile drawn March 17, 2003,     pending.  5. History of knee pain secondary to arthritis.  6. History of chest wall pain in the past.  7. Admission to the hospital  on Jan 16, 2002, for chest pain,  possibly     angina. Cardiolite study negative.  8. The patient has had left heart catheterization in the past, notably     July 31, 1998, by Dr. Vicenta Aly. The study showed a normal left main     with a 25% proximal  LAD, 25% and 50% stenosis in the circumflex, right     coronary artery had a 25% stenosis, normal wall function with the finding     of nonobstructive coronary artery disease.  9. Admission to the hospital in February 2004 with angina secondary to     coronary artery disease. A stress test showed a small anteroseptal apical     defect with a normal ejection fraction of 53%.  10.      Status post cholecystectomy.  11.  Status post  appendectomy.  12.      Status post  tonsillectomy.  13.      Status post  hysterectomy.  14.      Status post bladder tacking surgery.  15.      The patient ruled out for myocardial infarction at [facility]. Electrocardiogram done at [facility] showed normal     sinus rhythm, normal QRS transitions in the anterior  leads, no ST     depression or elevation.   BRIEF HISTORY:  Norma [name] is a 77 year old female with multiple medical  problems. She presented to [facility] on March 14, 2003, with an  episode of chest pain. The patient states that she has had recurrent chest  pain. She was admitted to the hospital  earlier this year with a stress test  which was negative. The patient has had chest pain off and on since the last  1 to 2 weeks, as she feels a heaviness and tightness in the chest about mid  portion in the retrosternal area. She is having chest tightness lasting a  few minutes. There is no radiation to the neck or jaw. The pain is about a  5/10 on admission.   She has no fevers or chills, no cough, cold or congestion. She denies  any  ear, nose or throat symptoms, no gastrointestinal or gastrourinary symptoms.  The patient is currently getting epidural steroid injection which cannot be  given because  of high serum glucose.   The patient will be admitted to the [facility], given IV fluids with  serial cardiac enzymes, also a spiral CT scan of the chest with pleural  effusion protocol. A D-dimer will also be added to the blood studies.   On March 15, 2003, she was seen in consultation by Dr. Dannielle Burn. She was  transferred to [facility] and enrolled in the ACUITY study,  receiving Integrilin and Lovenox and scheduled for left heart  catheterization the same day, March 15, 2003, for ongoing discomfort  in the  chest.   DISPOSITION:  On the day of discharge, March 17, 2003, she had just been seen  by Dr. Joya Gaskins in consultation after his review of radiology studies, it was  his feeling that she had a chronic  pneumatocele relating to past pneumonia.  Once again he would review the CT studies  and get in contact with Ms.  [name]. At the time of discharge she is not having chest pain, not even  with exertion. She is alert and oriented. She is ambulating independently.  Her catheterization site in the right groin has no evidence of swelling or  ecchymosis, no draining. She is not having any pain there.   DISCHARGE MEDICATIONS:  She goes home on the following  medications.  1. Enteric coated aspirin 325 mg daily.  2. Plavix 75 mg daily.  3. Isosorbide mononitrate 60 mg daily.  4. Lopressor 50 mg 1/2 tablet q. a.m., 1/2 tablet q. p.m.  These are all new medications.  1. Glyburide 5 mg 2 tablets q. a.m., 2 tablets q. p.m.  2. Prilosec 20 mg daily.  3. Valium 5 mg 1/2 tablet  b.i.d. p.r.n.  4. Insulin 70/30, 18 units q. a.m., 14 units q. p.m.  5. Novolog flex pen p.r.n.  6. Take Mobic only if you need it.  7. Phenergan 25 mg p.r.n.  8. Vicodin  p.r.n.  9. The patient is to hold Glucophage until March 20, 2003, and then start     taking it again. She takes 500 mg 2 pills b.i.d. 10.      She is also at this time to hold  on her Maxzide, Cardura 4 mg 1/2     tablet daily, her  Toprol XL 100 mg daily, Avapro 300 mg daily until she     sees Dr. Dannielle Burn in the office.  11.      She will also be given a prescription for nitroglycerin 0.4 mg     tablets 1 sublingually every 5 minutes p.r.n. chest pain.   DISCHARGE INSTRUCTIONS:  Activity, she is to walk daily to keep up her  strength. She was asked not to lift any heavy weights, nor to drive, nor to  engage in any sexual activity for 2 days. Discharge diet, low sodium, low  cholesterol, ADA diet. She may shower. She is to call [facility] in  [place], [phone] if she experiences swelling or increased  pain at the  catheterization site.  She will also be scheduled for a Cardiolite study at [facility] and  after the Cardiolite study, she [facility]. Once again Dr. Joya Gaskins will call when he reviews the CT  scan.   LABORATORY DATA:  March 17, 2003, CBC:  Hemoglobin 10.8, hematocrit 31.5,  white cells 7.1, platelets 299. The BMET on March 17, 2003, sodium 140,  potassium 3.6, chloride 108, bicarbonate 26, BUN 6, creatinine 0.4, glucose  101. A lipid profile  was taken March 17, 2003, and is pending at the time of  discharge. No cardiac enzymes or D-dimer were taken here; these were all  taken at [facility] I will dictate an addendum when the visits for  Cardiolite study and [place] have been finalized.                                                 Sueanne Margarita, P.A.    GM/MEDQ  D:  03/17/2003  T:  03/17/2003  Job:  [id number]   cc:   Ernestine Mcmurray, M.D.  [address]  [address] [place] [address]   Dhruv Vyas  [address]  [place] [address]  Fax: [phone]    cc:   Ernestine Mcmurray, M.D.  [address]  [address] [place] [address]   Dhruv Vyas  [address]  [place]  [place] [address]  Fax: [phone]

## 2011-01-31 NOTE — Op Note (Signed)
NAME:  Norma [name], Norma [name] NO.:  [id number]   MEDICAL RECORD NO.:  [id number]                   PATIENT TYPE:  INP   LOCATION:  2873                                 FACILITY:  [facility]   PHYSICIAN:  Otilio Connors, M.D.               DATE OF BIRTH:  Dec 20, 1933   DATE OF PROCEDURE:  07/17/2003  DATE OF DISCHARGE:                                 OPERATIVE REPORT   PREOPERATIVE DIAGNOSIS:  Herniated nucleus pulposus, spondylosis,  myelopathy, C5-6, C6-7.   POSTOPERATIVE DIAGNOSIS:  Herniated nucleus pulposus, spondylosis,  myelopathy, C5-6, C6-7.   OPERATION PERFORMED:  Anterior cervical decompression, diskectomy and  fusion, C5-6 and C6-7 with LifeNet allograft bone and tether anterior  cervical plate.   SURGEON:  Otilio Connors, M.D.   ASSISTANT:  Elizabeth Sauer, M.D.   ANESTHESIA:  General endotracheal.   ESTIMATED BLOOD LOSS:  213mL.   BLOOD REPLACED:  None.   DRAINS:  None.   COMPLICATIONS:  None.   INDICATIONS FOR PROCEDURE:  The patient is a 77 year old woman who has had  bilateral arm pain, worse on the left arm, weakness, and intermittent  numbness,  and has had some chronic walking problems due to knee problems,  but there could be some mild myelopathic symptoms.  MRI was done showing  severe spondylosis at the cervical spine, especially at C5-6 and C6-7 with  canal stenosis and biforaminal narrowing worse on the left at 6-7.  Patient  brought for two-level decompression at C5-6 and C6-7 with fusion.   DESCRIPTION OF PROCEDURE:  The patient was brought to the operating room and  general anesthesia induced.  The patient was placed in halter traction with  10 pounds, prepped and draped in sterile fashion.  Site of incision was  injected with 10 mL 1% lidocaine with epinephrine.  Incision was then made  from the midline to the anterior border of the sternocleidomastoid muscle on  the left side of the neck.  Incision taken down to the  platysma.  Hemostasis  obtained with Bovie cauterization.  Platysma was incised with a Bovie and  blunt dissection was taken to the anterior cervical fascia.  In the anterior  cervical spine, a needle was placed in the interspace.  There were large  osteophytes.  Utilized x-ray, for confirming we were at the C6 vertebral  body.  We placed two needles, one at the 5-6 space, one at the 6-7 and took  another x-ray confirming our positioning, then removed the needles.  The  osteophytes were excised from the disk space.  We did partial diskectomy  with  pituitary rongeurs.  We then mobilized the longus colli muscle on each  side using the Bovie and self-retaining retractor system.  Put distraction  pins in C5 and C7, distracted the interspaces, performed diskectomy with  pituitary rongeurs, curets and the high speed drill.  Removed the  cartilaginous  end plates.  Performed bilateral foraminotomies drilling down  to the intervertebral joints bilaterally.  The first joint 6-7 then at 5-6.  1 and 2 mm Kerrison punches were then used to continue diskectomy removing  the posterior longitudinal ligament, posterior osteophytes and perform  bilateral foraminotomies.  When we were finished, we had good decompression  of the central canal, the cord and bilateral nerve roots at both 5-6 and 6-  7.  Hemostasis obtained with Gelfoam soaked in thrombin.  Measured the  interspace height using the LifeNet trials; they measured 5 mm each.  Roughed up the end plates using the 5 mm broach and then tapped 5 mm LifeNet  allograft bone into 5-6 and another one at 6-7 countersinking a 1 mm shelf  in each plane between the allograft and the bone posteriorly when we checked  with a probe.  Wound was irrigated with antibiotic solution.  Distraction  was removed along with the distraction pins and weight on the traction was  also removed.  Hemostasis of the bone was obtained with Gelfoam soaked in  thrombin.  A tether  anterior cervical plate was placed over the anterior  cervical spine, two screws were placed at C5, one at C6, two in C7.  They  were final tightened.  X-ray was obtained showing good position of plate and  screws, interbody bone plugs at C5-6 and C6-7.  Retractors were removed and  hemostasis was obtained with bipolar cauterization and Gelfoam soaked in  thrombin.  Good hemostasis was obtained.  The platysma was closed with 3-0  Vicryl interrupted sutures.  The subcutaneous tissue was closed with the  same.  Skin was closed with benzoin and Steri-Strips. A dressing was placed,  patient was placed in a cervical collar, awakened from anesthesia and  transferred to recovery in stable condition.                                               Otilio Connors, M.D.    JRH/MEDQ  D:  07/17/2003  T:  07/17/2003  Job:  [id number]

## 2011-01-31 NOTE — Assessment & Plan Note (Signed)
Norma [name] OFFICE NOTE   NAME:Norma [name], Norma [name]                      MRN:          [id number]  DATE:05/04/2006                            DOB:          April 14, 1934    PRIMARY CARDIOLOGIST:  Ernestine Mcmurray, MD.   REASON FOR CONSULTATION:  Norma [name] is a 77 year old female with history  of nonobstructive coronary artery disease by multiple prior cardiac  catheterizations, now referred for preoperative cardiac clearance.  Patient  is awaiting clearance to undergo left knee replacement by Dr. Wynelle Link, in  approximately one month.   Since last seen in July of last year, patient reports no significant change  from her pattern of mild, exertional chest tightness relieved with rest.  She denies any symptoms at rest.  She has known nonobstructive coronary  artery disease with her most recent catheterization in May 2006.  Patient  denies any symptoms that would suggest any significant change, nor  exacerbation in this pattern which she has had for many years.  Of note, she  has undergone multiple prior cardiac catheterizations beginning in November  1999, then in May 2003, July 2004 and, most recently, in May of last year.   Electrocardiogram today reveals normal sinus rhythm at 72 beats per minute  with normal axis; LVH by voltage criteria; nonspecific ST abnormalities.   ALLERGIES:  NO KNOWN DRUG ALLERGIES.   HOME MEDICATIONS:  1. Glucophage 1000 mg b.i.d.  2. Toprol XL 50 mg daily.  3. Aspirin 81 mg daily.  4. NovoLog 70/30 units as directed.  5. Hydrocodone as directed.   PAST MEDICAL HISTORY:  1. Nonobstructive coronary artery disease.      a.     Status post multiple prior cardiac catheterizations (first       November 1999; most recently May 2006).  2. Normal left ventricular function.  3. Insulin-requiring diabetes mellitus.  4. History of bradycardia.  5. Hypertension.  6. Hyperlipidemia.  7.  Chronic back pain.  8. History of TIA.  9. Abnormal chest CT.      a.     Right base air cavity with two small nodular densities by CT       scan July 2004.  10.Status post right knee replacement.  11.Status post total abdominal hysterectomy.   REVIEW OF SYSTEMS:  As noted per HPI.  Denies orthopnea, PND, or lower  extremity edema.  She has limited mobility secondary to severe left knee  pain.  Nevertheless, is able to climb a flight of stairs with some  associated dyspnea, but no associated chest discomfort.  Remaining systems  negative.   SOCIAL HISTORY:  Denies alcohol or tobacco use.   FAMILY HISTORY:  Mother deceased age 4, history of coronary disease.  Father deceased age 70, history of myocardial infarction.  Patient does have  a brother who has history of MI/subsequent bypass surgery at age 76.   PHYSICAL EXAMINATION:  GENERAL APPEARANCE:  A 76 year old female, sitting  upright in no apparent distress.  VITAL SIGNS:  Blood pressure 144/80, pulse 72, regular.  HEENT:  Normocephalic and atraumatic.  NECK:  Comparable bilateral carotid pulses with no bruits.  LUNGS:  Clear to auscultation in all fields.  CARDIOVASCULAR:  Regular rate and rhythm (S1 and S2), no significant  murmurs.  ABDOMEN:  Soft, nontender with intact bowel sounds.  EXTREMITIES:  Palpable distal pulses with no significant pedal edema.  NEUROLOGIC:  No focal deficit.   IMPRESSION:  Norma [name] is a 77 year old female, with longstanding history  of exertional angina pectoris, but with known nonobstructive coronary artery  disease by multiple prior cardiac catheterizations, the last in May 2006.  Patient presents with no signs/symptoms suggestive of crescendo angina  pectoris.   Patient is now referred for a preoperative cardiac catheterization, and is  scheduled to undergo total replacement of the left knee.   PLAN:  Patient has a stable, mild chronic angina pectoris which may be  secondary to  microvascular disease.  Given a recent coronary angiogram,  however, no further work-up is indicated at this time.  She is therefore  allowed to proceed with left knee surgery, as planned, and will be at a low  risk for perioperative events from a cardiovascular standpoint.  Recommendation is to continue beta-blocker throughout the perioperative  period, and to resume low dose aspirin when cleared from a surgical  standpoint.   We will otherwise plan on having patient return to the clinic to resume  follow-up with Dr. Terald Sleeper in one year.   Of note, does need to be on a Statin for aggressive lipid management, for  treatment of nonobstructive coronary artery disease in the context of  diabetes mellitus.  However, we will defer this to Dr. Woody Seller regarding  initiation of a Statin.  Additionally, recommendation is for patient to have  a repeat CT scan of the chest for follow-up of a previously noted right base  air cavity as  well as two small nodular densities of the right lung, we will also defer  scheduling of this to Dr. Woody Seller, as well.                                   Gene Serpe, PA-C                                Satira Sark, MD   GS/MedQ  DD:  05/04/2006  DT:  05/04/2006  Job #:  [id number]   cc:   Gaynelle Arabian, MD

## 2011-01-31 NOTE — H&P (Signed)
Norma [name], Norma [name]               ACCOUNT NO.:  [id number]   MEDICAL RECORD NO.:  [id number]          PATIENT TYPE:  INP   LOCATION:  [place]                         FACILITY:  [facility]   PHYSICIAN:  Kipp Brood. Gioffre, M.D.DATE OF BIRTH:  1934-09-10   DATE OF ADMISSION:  04/27/2008  DATE OF DISCHARGE:  04/29/2008                              HISTORY & PHYSICAL   CHIEF COMPLAINT ON ADMISSION:  Lower back and bilateral leg pain.   HISTORY OF PRESENT ILLNESS:  The patient was a 77 year old female who is  a well-known patient to Dr. Gladstone Lighter, who has issues related to lower  back pain and bilateral leg pain.  Back in 2008 the patient was supposed  to have a decompressive lumbar laminectomy at L4-5, L5-S1 but due to  medical reasons did not have the procedure done and failed to follow up  with Dr. Gladstone Lighter.  The patient represented to Dr. Gladstone Lighter in July of  this year with new pains in her lower back.  Further evaluation with  __________ indicates she had significant spinal stenosis at L4-5, L5-S1  in the lumbar spine.  The patient elected to proceed with her surgical  procedure and she had __________.  Once again, this history and physical  was done from admission notes, previous history and physicals due to the  fact that I did not see her on this admission.   ALLERGIES ON ADMISSION:  No known drug allergies.   MEDICATIONS ON ADMISSION:  1. Xanax 1 mg twice a day as needed.  2. Naproxen 375 twice a day.  3. Novolin insulin 70/30 34 units in the morning, 28 units in the      evening.  4. Toprol XL 100 mg.  5. Vesicare 5 mg in the morning.  6. Lisinopril 20 mg a day.  7. Hydrocodone p.r.n.  8. Percocet 5 mg p.r.n.  9. Glyburide/metformin 5/500 two tablets twice a day   PAST MEDICAL HISTORY:  1. Hypertension.  2. Diabetes.  3. History of hiatal hernia.  4. Urinary continence.  5. History of TIAs.   PAST SURGICAL HISTORY:  1. Cholecystectomy.  2. Hysterectomy.  3. Bladder tack.  4. Kidney surgery.   No complications.   FAMILY MEDICAL HISTORY:  Mother is deceased from old age.  Father is  deceased from complications of MI.   SOCIAL HISTORY:  The patient was married, retired, lives with her  husband.   PHYSICAL EXAMINATION:  The patient was a healthy-appearing female,  obese.  HEENT:  Head was normocephalic.  Pupils equal, round and reactive.  Extraocular motions intact.  Oropharynx intact.  NECK:  Supple.  Good range of motion.  CHEST:  Lung sounds were clear and equal bilaterally.  HEART:  Regular rate and rhythm.  ABDOMEN:  Round, obese, soft, nontender.  Bowel sounds present.  EXTREMITIES:  Upper extremities had good range of motion without any  difficulty.  Lower extremities:  She had good range of motion of both  hips in all directions without any difficulty.  Straight leg raise right  and left without any discomfort.  She was able to  fully extend both  knees and flex them back to 110 degrees.  Good motion at the ankles.  LUMBAR EXAM:  She had significant pain with motion of her lower back.  No spasms across the back.  She was intact light touch sensation of both lower extremities.  BREAST, RECTAL AND GU EXAMS:  Deferred.   IMPRESSION:  1. Severe spinal stenosis at L4-5, L5-S1, with a small disk at L4-5 on      the left.  2. Obesity.  3. Hypertension.  4. Diabetes.  5. Hiatal hernia.  6. History of transient ischemic attacks.  7. History urinary incontinence.   PLAN:  The patient was admitted to [facility] under the care  of Dr. Latanya Maudlin for a decompressive lumbar laminectomy at L4-5, L5-  S1, and evaluation of a small disk at L4-5 on the left.      Evert Kohl, P.A.    ______________________________  Kipp Brood Gladstone Lighter, M.D.    RWK/MEDQ  D:  05/24/2008  T:  05/24/2008  Job:  [id number]

## 2011-01-31 NOTE — Consult Note (Signed)
NAME:  Norma [name], Norma [name] NO.:  [id number]   MEDICAL RECORD NO.:  [id number]                   PATIENT TYPE:  INP   LOCATION:  6525                                 FACILITY:  [facility]   PHYSICIAN:  Asencion Noble, M.D. [facility]            DATE OF BIRTH:  Jun 20, 1934   DATE OF CONSULTATION:  DATE OF DISCHARGE:  03/17/2003                                   CONSULTATION   CHIEF COMPLAINT:  Abnormal cyst in right lower lobe.   HISTORY OF PRESENT ILLNESS:  This is a 77 year old white female with  multiple problems admitted to [facility] on March 14, 2003, with chest  pain, transferred to [facility] where she underwent cardiac catheterization with  angiography of her coronaries, and found to have stable lesions to be  treated medically.  Found also to have cysts in the right lower lobe.  We  were asked to see the patient for this.   PAST MEDICAL HISTORY:  1. Pneumonia as a child that was severe.  The patient is a nonsmoker.  2. History of lacunar infarct on old CT scan.  3. History of type 2 diabetes.  4. Hypertension.  5. History of degenerative joint disease of the knees.  6. History of chest wall pain in the past.  7. History of coronary artery disease in the past.   PAST SURGICAL HISTORY:  1. Cholecystectomy.  2. Appendectomy.  3. Tonsillectomy.  4. Hysterectomy.  5. Bladder tuck.   CURRENT MEDICATIONS:  1. Premarin 1.25 mg daily.  2. Insulin 70/30 18 units daily a.c.  3. Protonix 40 mg daily.  4. Imdur 60 mg daily.  5. Lopressor 25 mg b.i.d.  6. Plavix 75 mg daily.  7. Aspirin 325 mg daily.  8. 70/30 14 units a.c. h.s.  9. Potassium 40 mEq x1 dose.  10.      Multiple p.r.n. medications listed.   SOCIAL HISTORY:  Nonsmoker, does not drink.  Used to work for [employer], now retired.   FAMILY HISTORY:  Positive for coronary artery disease and diabetes.   REVIEW OF SYSTEMS:  Noncontributory.   PHYSICAL EXAMINATION:  GENERAL:  This is a  well-developed, well-nourished  white female in no acute distress.  VITAL SIGNS:  Temperature 98, blood pressure 140/65, pulse 85, respirations  20, saturation 96% on room air.  CHEST:  Clear with distant breath sounds.  No evidence of wheeze or rhonchi.  CARDIAC:  Regular rate and rhythm, S3, normal S1 and S2.  ABDOMEN:  Soft, nontender.  EXTREMITIES:  No edema or clubbing.  NEUROLOGIC:  Intact.  SKIN:  Clear.   LABORATORY DATA:  CT scan report is reviewed and shows cystic areas in the  right lower lobe, multiple air cavities seen, likely compatible with  previous infection with evolving pneumatoceles, this would be consistent  with the patient's previous history of severe pneumonia as a child.  No  other nodules or abnormalities are noted on the report.  Sodium 141,  potassium 2.1, chloride 107, CO2 29, BUN 7, creatinine 0.5, blood sugar 176,  hemoglobin 10, white count 6.6.  INR 1.   IMPRESSION:  Likely postpneumonic pneumatoceles, right lower lobe.  The  patient had severe pneumonia as a child, likely had damage to the lung from  this.  No recent x-rays for comparison.  Current CT scans are not directly  available for review, but need to be reviewed personally.   RECOMMENDATIONS:  I will have the CT scans sent to my office for personal  review as an outpatient.  I will telephone the patient as an outpatient for  followup, but doubt that further pulmonary workup is needed.  From a  pulmonary standpoint, the patient can be discharged home at any time.                                                Asencion Noble, M.D. [facility]    PW/MEDQ  D:  03/17/2003  T:  03/18/2003  Job:  [id number]   cc:   Loretha Brasil. Lia Foyer, M.D.   Ernestine Mcmurray, M.D.  [address]  [place] [address]   Dhruv Vyas  [address]  [place]  [place] [address]  Fax: [phone]

## 2011-01-31 NOTE — Op Note (Signed)
NAMESHEBA, SEFTON               ACCOUNT NO.:  [id number]   MEDICAL RECORD NO.:  [id number]          PATIENT TYPE:  INP   LOCATION:  0005                         FACILITY:  [facility]   PHYSICIAN:  Gaynelle Arabian, M.D.    DATE OF BIRTH:  1933-10-20   DATE OF PROCEDURE:  06/08/2006  DATE OF DISCHARGE:                                 OPERATIVE REPORT   PREOPERATIVE DIAGNOSIS:  Osteoarthritis left knee.   POSTOPERATIVE DIAGNOSIS:  Osteoarthritis left knee.   PROCEDURE:  Left total knee arthroplasty.   SURGEON:  Gaynelle Arabian, M.D.   ASSISTANT:  Alexzandrew L. Perkins, P.A.-C.   ANESTHESIA:  General, with postop Marcaine pain pump.   ESTIMATED BLOOD LOSS:  Minimal.   DRAIN:  Hemovac x1.   TOURNIQUET TIME:  39 minutes at 300 mmHg.   COMPLICATIONS:  None.   CONDITION:  Stable to recovery.   BRIEF CLINICAL NOTE:  Ms. Wiley is a 77 year old female with end-stage  arthritis of her left knee.  She has had a previous right total knee  arthroplasty and presents now for left total knee arthroplasty.   PROCEDURE IN DETAIL:  After successful administration of general anesthetic,  a tourniquet was placed high on the left thigh, and the left lower extremity  prepped and draped in the usual sterile fashion.  Extremity was wrapped in  an Esmarch, knee flexed, tourniquet inflated to 300 mmHg.  Midline incision  was made with a 10 blade through subcutaneous tissue to the level of the  extensor mechanism.  A fresh blade was used make a medial parapatellar  arthrotomy.  Soft tissue of the proximal medial tibia is subperiosteally  elevated to the joint line with the knife and into the semimembranosus bursa  with a Cobb elevator.  Soft tissue laterally is elevated, with attention  being paid to avoiding  the patellar tendon on tibial tubercle.  Patella was  everted, knee flexed to 90 degrees, ACL and PCL removed.  Drill was used to  create a starting hole in the distal femur, and the canal was  thoroughly  irrigated.  A 5-degree left valgus alignment guide was placed referencing  off the posterior condyles.  Rotation was marked, and the block pins were  moved 11 mm off the distal femur to allow for correction of her flexion  contracture.  Distal femoral resection was made with an oscillating saw.  Sizing blocks were placed.  A size 3 was most appropriate.  Rotation was  marked at the epicondylar axis.  A size 3 cutting blocks placed. Anterior,  posterior, and chamfer cuts were made.   Tibia subluxed forward and the menisci removed.  Extramedullary tibial  alignment guide was placed, referencing proximally at the medial aspect of  the tibial tubercle and distally along the second metatarsal axis and tibial  crest.  Blocks pinned to remove 10 mm off the non-deficient lateral side.  Tibial resection was made with an oscillating saw.  Size 3 was the most  appropriate tibial component, and the proximal tibia was prepared with the  modular drill and keel punch for a size  3.  Femoral preparation was  completed with the intercondylar cut.   Size 3 mobile bearing tibial trial, size 3 posterior stabilized femoral  trial, 10 mm posterior stabilized rotating platform insert trial were  placed.  With the 10, full extension was achieved, with excellent varus and  valgus balance throughout full range of motion.  Patella was then everted.  Thickness measured to be 22 mm.  Freehand resection was taken to 13 mm, a 38  template was placed, lug holes were drilled, trial patella was placed and  tracked normally.  Osteophytes were then removed off the posterior femur  with the trials in place.  All trials were removed and the cut bone surfaces  prepared with pulsatile lavage.  Cement was mixed and, once ready for  implantation, the size 3 mobile bearing tibial tray, size 3 posterior  stabilized femur, and 38 patella were cemented into place.  A trial 10 mm  inserts was placed with the knee held in  full extension, and all extruded  cement removed.  Once the cement was fully hardened, a permanent 10 mm  posterior stabilized rotating platform insert was placed into the tibial  tray.  Wound was copiously irrigated with saline solution, and the extensor  mechanism closed over Hemovac drain with interrupted #1 PDS.  Flexion  against gravity was 135 degrees.  Tourniquet was released, for a total time  of 39 minutes.  Subcutaneous was closed with interrupted 2-0 Vicryl,  subcuticular with a running 4-0 Monocryl.  Drains hooked to suction.  Catheter for Marcaine pain pump was placed. and the pump was initiated.  Steri-Strips and bulky sterile dressing were applied, and she was then  placed into a knee immobilizer, awakened, and transported to recovery in  stable condition.      Gaynelle Arabian, M.D.  Electronically Signed     FA/MEDQ  D:  06/08/2006  T:  06/10/2006  Job:  [id number]

## 2011-01-31 NOTE — Cardiovascular Report (Signed)
[name], [name]               ACCOUNT NO.:  [id number]   MEDICAL RECORD NO.:  [id number]          PATIENT TYPE:  INP   LOCATION:  6533                         FACILITY:  [facility]   PHYSICIAN:  Jenkins Rouge, M.D.     DATE OF BIRTH:  [birth year]   DATE OF PROCEDURE:  DATE OF DISCHARGE:                              CARDIAC CATHETERIZATION   INDICATIONS FOR PROCEDURE:  Diabetes mellitus with coronary risk factors,  chest pain.   A catheterization is done with 6 French catheters from the right femoral  artery.   Left main coronary artery had a 20% stenosis.   Left anterior descending coronary artery was calcified in the proximal  section, there was a 40 to 50% lesion in the mid vessel.  Distal vessel was  normal and reached the apex.  First diagonal branch had 30 to 40% multiple  discrete lesions.   The circumflex coronary artery was nondominant.  There were 20% multiple  discrete lesions proximally.  The AV groove branch took off in an acute  angle and had a 60% ostial stenosis which was unchanged from previous  catheterization.   Right coronary artery was dominant.  There was 20 to 30% multiple discrete  lesions in the proximal and mid vessel.  The PDA had 30% multiple discrete  lesions.   RAO ventriculography was normal.  Ejection fraction was in excess of 60%.  There was no gradient across the aortic valve and no MR.  Aortic pressure is  151/70, LV pressure is 151/15.   IMPRESSION:  The patient does not have critical coronary disease.  Medical  therapy is indicated.  I noted on her chart that even though she is an  elderly diabetic, she is not on a Statin or an ACE inhibitor.  These can be  started as an outpatient.   Although she does not have luminal stenosis, she does have significant  calcification of her coronary arteries and I think that a Statin drug in  combination with an ACE inhibitor would be good for her.   She did have to receive 1.25 mg of enalapril for her  blood pressure in the  room.   She will be discharged later today as long as her groin heals well.      PN/MEDQ  D:  02/07/2005  T:  02/07/2005  Job:  [id number]

## 2011-01-31 NOTE — Discharge Summary (Signed)
Norma [name], Norma [name]               ACCOUNT NO.:  [id number]   MEDICAL RECORD NO.:  [id number]          PATIENT TYPE:  INP   LOCATION:  [place]                         FACILITY:  [facility]   PHYSICIAN:  Kipp Brood. Gioffre, M.D.DATE OF BIRTH:  04/16/1934   DATE OF ADMISSION:  04/27/2008  DATE OF DISCHARGE:  04/29/2008                               DISCHARGE SUMMARY   ADMISSION DIAGNOSES:  1. Severe spinal stenosis at L4-L5, L5-S1 with a small herniated disk      at L4-L5 on the left.  2. Obesity.  3. Hypertension.  4. Diabetes.  5. History of hiatal hernia.  6. History of transient ischemic attacks.  7. History of urinary incontinence.   DISCHARGE DIAGNOSES:  1. Decompressive lumbar laminectomy at L3-L4, L4-L5, L5-S1 due to      severe spinal stenosis.  2. Obesity.  3. Hypertension.  4. Diabetes.  5. Hiatal hernia.  6. History of transient ischemic attacks.  7. History of urinary incontinence.   HISTORY OF PRESENT ILLNESS:  The patient is 77 year old female who was  planned to have a decompressive lumbar laminectomy at L4-L5, L5-S1 with  Dr. Gladstone Lighter 1 year previous.  The patient had medical issues.  She had  to cancel the procedure.  She was lost to followup.  The patient  presented back to Dr. Gladstone Lighter back in July of this year with continued  lower back pain  and then pain into both legs.  The patient's evaluation  found she had significant spinal stenosis of the lumbar spine.  The  patient has elected to proceed with the surgical procedure.  The patient  was readmitted.   SURGICAL PROCEDURES:  On April 27, 2008, the patient was taken to the  OR by Dr. Lindwood Qua assisted by Dr. Susa Day.  Under general  anesthesia, the patient underwent a decompressive lumbar laminectomy at  L3-L4, L4-L5, L5-S1 without any complications.  Estimated blood loss was  250 mL.  The patient was transferred to the recovery room and then to  the orthopedic floor in good condition.   CONSULTANT:   Following routine consults were requested:  1. Physical therapy.  2. Case management.   HOSPITAL COURSE:  On April 27, 2008, the patient was admitted to [facility] under the care of Dr. Lindwood Qua.  The patient was taken  to the OR where under general anesthesia, the patient underwent a  complete lumbar decompressive lumbar laminectomy at L3-L4, L4-L5, L5-S1  without any complications.  The patient was transferred to the recovery  room and then to the orthopedic floor in good condition where the  patient then incurred a 2 day postoperative course.  The patient was  able to transition off IV medications well to p.o. meds well.  The  patient's wound remained benign for any signs of infection.  It remained  clean.  She did have improvement in the amount of pain that she had in  her lower extremity.  She felt much better postoperatively.  She  remained neurologically intact in the lower extremity.  When she was  completed with the  24 hour physical therapy and a full extra day postop,  she was felt to be orthopedically and medically stable ready for  discharge home.  Arrangements were made.  She was discharged home in  good condition with follow up in 2 weeks per routine protocol.   LABORATORY DATA:  CBC on admission found WBCs 8.5, hemoglobin 12.5,  hematocrit 38.1, platelets 302.  On discharge, her H&H was 10.3 and  31.2.  Routine chemistries on admission within normal limits except for  a glucose of 236.  Her estimated GFR was greater than 60.  Her  routine  urinalysis on admission found that she had small leukocyte esterase,  many bacteria.  She was treated with perioperative antibiotics with the  culture of greater than 100,000 E. coli resistant to Bactrim and  ampicillin.   DISCHARGE INSTRUCTIONS:  1. Diet:  The patient is to maintain an 1800 calorie ADA diet.  2. Activity:  The patient is to slowly increase activity with use of a      walker.  3. Wound care:  The  patient is to change her dressing on a daily      basis.   FOLLOW UP:  The patient needs a followup appointment with Dr. Gladstone Lighter in  his office 2 weeks from date of discharge.  She is to call [phone] for  the appointment.   MEDICATIONS:  1. Percocet 10/650 1 tablet every 4-6 hours for pain if needed.  2. Robaxin 500 mg once every 6 hours for muscle spasms if needed.  3. The patient is to check her blood sugars at bedtime tomorrow and      the day following before breakfast.  She is to hold her diabetic      medications.  If her  blood sugars are less than 80, she is to call      her primary care physician  4. Xanax 1 mg twice a day as needed.  5. Naproxen 375 mg 1 tablet twice a day.  6. Novolin 70/30 insulin 34 units in the morning and 28 units in the      evening except for instructions noted above.  If her sugar the      following morning is less than 80, she needs to contact her primary      care physician for further instructions.  7. Toprol XL 100 mg in the morning.  8. Vesicare 5 mg in the morning.  9. Lisinopril 20 mg in the morning.  10.Vicodin on hold.  11.Percocet 5 mg on hold.  12.Glyburide/metformin 5/500 two tablets twice a day.   CONDITION ON DISCHARGE:  Improved and good.      Evert Kohl, P.A.    ______________________________  Kipp Brood Gladstone Lighter, M.D.    RWK/MEDQ  D:  05/24/2008  T:  05/24/2008  Job:  [id number]   cc:   Jori Moll A. Gladstone Lighter, M.D.  Fax: [phone]

## 2011-01-31 NOTE — Cardiovascular Report (Signed)
NAME:  Norma [name], Norma [name] NO.:  [id number]   MEDICAL RECORD NO.:  [id number]                   PATIENT TYPE:  INP   LOCATION:  4739                                 FACILITY:  [facility]   PHYSICIAN:  Scarlett Presto, M.D.                DATE OF BIRTH:  03/20/1934   DATE OF PROCEDURE:  03/15/2003  DATE OF DISCHARGE:                              CARDIAC CATHETERIZATION   REFERRING PHYSICIAN:  Dr. Jerene Bears, [place]:  Ernestine Mcmurray, M.D.   PROCEDURES:  1. Left heart catheterization.  2. Selective coronary angiography.  3. Left ventriculography in a biplane view, all from the groin.   CARDIOLOGIST:  Scarlett Presto, M.D.   HISTORY OF PRESENT ILLNESS:  Norma [name] is a 77 year old white female with  hypertension, hyperlipidemia, and diabetes mellitus, who has known  nonobstructive coronary artery disease and catheterization done in 1999 who  presents with unstable angina to [facility]. She as admitted, ruled  out for myocardial infarction, and transferred down to [facility], enrolled in the ACUITY study where she received Integrilin and  Lovenox and was directed to the catheterization lab for ongoing discomfort  in her chest.   DETAILS OF THE PROCEDURE:  After obtaining informed consent, the patient was  brought to the cardiology catheterization laboratory emergently.  There she  was prepped and draped in the usual sterile manner.  Local anesthetic was  obtained over the right groin using 1% lidocaine without epinephrine.  The  right femoral artery was cannulated without difficulty using the modified  Seldinger technique with a 6-French 10 cm sheath.  Left heart  catheterization was performed using a 6-French Judkins left #4, a 6-French  Judkins right #4, and a 6-French angled pigtail catheter.  The pigtail  catheter was utilized for the left ventriculography which was imaged in both  the 30 degree  RAO and 60 degree LAO views.  At the conclusion of the  procedure, the catheters were removed, the sheath was sewn in place, and the  patient was taken back to the cardiac catheterization holding area.   RESULTS:  1. Aortic pressure was 117/61 with a mean arterial pressure of 84 mmHg.  2. Left ventricular pressure was 129/3 with an end-diastolic pressure of 9     mmHg.  3. There was no pullback gradient.   SELECTIVE CORONARY ANGIOGRAPHY:  1. Left main coronary artery  is a large artery with luminal irregularities.  2. The left anterior descending coronary artery is a large transapical     vessel with two moderate size diagonal branches. There is a 50% lesion     just after the second diagonal which is hazy, and there is TIMI-2 flow     distal to the lesion in the left anterior descending coronary artery.  3. The left circumflex coronary artery is moderate caliber vessel which has  two small obtuse marginals proximal.  There is a third obtuse marginal     and a posterolateral branch.  The posterolateral branch is a moderate     caliber vessel which has serial 99% stenoses in it.  4. The right coronary artery is a large dominant vessel with 25% stenosis in     its proximal portion and 25% stenosis in its mid portion.  There is a 25%     stenosis in a large dominant posterior descending coronary artery. The     posterolateral branches are moderate caliber vessels without disease.   LEFT VENTRICULOGRAM:  Left ventriculogram reveals an ejection fraction of  65% with no significant wall motion abnormalities and no  mitral  regurgitation.   ASSESSMENT:  This is a woman with three-vessel coronary disease of which the  circumflex appears to be significantly obstructive.  There is, however, a  lesion in the left anterior descending artery which is concerning due to the  TIMI-2 flow through the artery.  It is difficult to know whether this is  just endothelial dysfunction or whether the 50%  lesion in the mid portion of  the artery is actually more significant.  The haziness of the vessel along  with the calcium seen along the left anterior descending coronary artery is  concerning for this being an occlusive lesion that has subsequently lysed  with the medical therapy the patient received.   I am going to review the films with my interventional colleagues and  consider angioplasty and potentially IVUS of the left anterior descending  coronary artery tomorrow.   PLAN:                                               Scarlett Presto, M.D.    JH/MEDQ  D:  03/15/2003  T:  03/15/2003  Job:  [id number]  Dr. Dionicia Abler, M.D.  [address]  [place] [address]   cc:   Dr. Dionicia Abler, M.D.  [address] [place]  [place] [address]

## 2011-01-31 NOTE — H&P (Signed)
Norma [name], Norma [name]               ACCOUNT NO.:  [id number]   MEDICAL RECORD NO.:  [id number]          PATIENT TYPE:  INP   LOCATION:  1615                         FACILITY:  [facility]   PHYSICIAN:  Norma [name], M.D.    DATE OF BIRTH:  02/18/1934   DATE OF ADMISSION:  06/08/2006  DATE OF DISCHARGE:                                HISTORY & PHYSICAL   CHIEF COMPLAINT:  Left knee pain.   HISTORY OF PRESENT ILLNESS:  Norma [name] is a 77 year old female with a long-  standing history of left knee pain that has been progressively getting worse  over several years now and is at the point where it is hurting her all the  time and intolerable.  X-rays show end stage arthritis.  She has had a  previous right total knee, has done well, and she is ready to undergo her  left side.  She is felt to be a good candidate.  She has been seen  preoperatively by Norma [name] and also at [facility] and felt to be at  a low cardiac risk.   ALLERGIES:  No known drug allergies.   CURRENT MEDICATIONS:  Hydrocodone, Novolin 70/30, Toprol XL, Metformin,  Alprazolam, and aspirin.   PAST MEDICAL HISTORY:  Nonobstructive coronary artery disease, normal left  ventricular function, lumbar degenerative disc disease and cervical  degenerative disc disease, insulin dependent diabetes mellitus, history of  bradycardia, hypertension, hyperlipidemia, chronic back pain, history of  TIA, abnormal chest CT with two small nodular densities seen on scan in July  2004, hiatal hernia, reflex disease, urinary incontinence, post menopausal.   PAST SURGICAL HISTORY:  She has undergone multiple cardiac catheterization  with the most recent being in June 2004 and May 2006, cholecystectomy, neck  surgery, right total knee, total abdominal hysterectomy.   SOCIAL HISTORY:  Married, housewife, nonsmoker, no alcohol, four children.   FAMILY HISTORY:  Significant for heart disease, diabetes, and arthritis.   REVIEW OF  SYMPTOMS:  GENERAL:  No fevers, chills, or night sweats.  NEUROLOGICAL:  No seizures,  syncope, or paralysis, although she does have a history of TIA.  CARDIOVASCULAR:  Coronary artery disease, no chest pain, angina, or  orthopnea.  GI:  No nausea, vomiting, diarrhea, or constipation.  GU:  A  little bit of urinary incontinence, no dysuria or hematuria.  MUSCULOSKELETAL:  Left knee.   PHYSICAL EXAMINATION:  VITAL SIGNS:  Pulse 58, respirations 12, blood pressure 124/74.  GENERAL:  77 year old short stature, overweight, with hip and thigh obesity,  white female.  She is alert, oriented, and cooperative, very pleasant,  accompanied by her husband.  HEENT:  Normocephalic, atraumatic, pupils round and reactive, oropharynx  clear, EOMs intact, she does have an upper denture plate, no lower  dentition.  CHEST:  Clear.  NECK:  Supple.  HEART:  Bradycardic rhythm, although regular rhythm, S1, S2, no rubs,  thrills, palpitations, or murmurs are appreciated.  ABDOMEN:  Soft, round, protuberant.  RECTAL/BREASTS/GENITALIA:  Not done, not pertinent to the present illness.  EXTREMITIES:  Significant to the left knee which shows a varus  malalignment  deformity, range of motion 5 to 115, on passive range of motion, marked  crepitus is noted, tender more medial than lateral.   IMPRESSION:  1. Osteoarthritis, left knee.  2. Nonobstructive coronary arterial disease.  3. Normal left ventricular function.  4. Lumbar degenerative disc disease.  5. Cervical degenerative disc disease.  6. Chronic back pain.  7. Insulin dependent diabetes mellitus.  8. History of bradycardia.  9. Hypertension.  10.Hyperlipidemia.  11.History of transient ischemic attack.  12.Abnormal chest CT with two small nodular densities seen on scan July      2004.  13.Hiatal hernia.  14.Reflux disease.  15.Urinary incontinence.  16.Post menopausal.   PLAN:  The patient will be admitted to [facility] to undergo a   left total knee arthroplasty.  Surgery will be performed by Dr. Gaynelle [name].  Her cardiologist, Dr. Rozann [name], [facility], will be  consulted and notified if needed for any medical assistance with the patient  throughout the hospital course.      Norma [name], P.A.      Norma [name], M.D.  Electronically Signed    ALP/MEDQ  D:  06/07/2006  T:  06/09/2006  Job:  [id number]   cc:   Norma Sark, MD  [address], [place] [address]   Norma [name]

## 2011-01-31 NOTE — Discharge Summary (Signed)
Norma [name], Norma [name]               ACCOUNT NO.:  [id number]   MEDICAL RECORD NO.:  [id number]          PATIENT TYPE:  INP   LOCATION:  [place]                         FACILITY:  [facility]   PHYSICIAN:  Gaynelle Arabian, M.D.    DATE OF BIRTH:  06/08/1934   DATE OF ADMISSION:  06/08/2006  DATE OF DISCHARGE:  06/15/2006                                 DISCHARGE SUMMARY   ADMITTING DIAGNOSES:  1. Osteoarthritis, left knee.  2. Non-obstructive coronary arterial disease.  3. Normal left ventricular function.  4. Lumbar degenerative disk disease.  5. Cervical degenerative disk disease.  6. Chronic back pain.  7. Insulin-dependent diabetes mellitus.  8. History of bradycardia.  9. Hypertension.  10.Hyperlipidemia.  11.History of transient ischemic attack.  12.Abnormal CT with two small nodular disk disease seen on scan July 2004.  13.Hiatal hernia.  14.Reflux disease.  15.Urinary incontinence.  16.Postmenopausal.   DISCHARGE DIAGNOSES:  1. Osteoarthritis left knee, status post left total knee arthroplasty.  2. Acute blood loss anemia, did not require transfusion.  3. Hyponatremia, improved.  4. Hypokalemia, improved.  5. Non-obstructive coronary arterial disease.  6. Normal left ventricular function.  7. Lumbar degenerative disk disease.  8. Cervical degenerative disk disease.  9. Chronic back pain.  10.Insulin-dependent diabetes mellitus.  11.History of bradycardia.  12.Hypertension.  13.Hyperlipidemia.  14.History of transient ischemic attack.  15.Abnormal CT with two small nodular disk disease seen on scan July 2004.  16.Hiatal hernia.  17.Reflux disease.  18.Urinary incontinence.  19.Postmenopausal.   PROCEDURE:  June 08, 2006:  Left total knee surgery, Dr. Wynelle Link.   ASSISTANT:  Arlee Muslim, PA-C, return time 39 minutes.   CONSULTATION:  Rehabilitation Services.   BRIEF HISTORY:  Norma [name] is a 77 year old female with endstage arthritis  of the left knee, previous  right total knee, now presents for left total  knee.   LABORATORY DATA:  Pre-op CBC:  Hemoglobin 12.1, hematocrit of 35.4, normal  white count 8.5, differential within normal limits.  Post-op hemoglobin down  at 10.4, drifted down to 10.2.  Last noted H&H was 9.4 and 27.5.  PT/PTT pre-  op 13.2 and 33 respectively, INR 1.0.  Serial pro-times followed.  Last  PT/INR 23.0 and 1.9.  Chem panel on admission:  Slightly elevated CO2 of 33,  remaining chem panel within normal limits.  Serial BMETS were followed.  Sodium did drop from 139 to 133, back up to 139.  Potassium dropped 3.7 down  to 3.4, back up to 3.6.  Pre-op UA was positive for a UTI, 11-20 white  cells, moderate leukocyte esterase, positive nitrite, many bacteria.  Followup UA:  Trace hemoglobin, positive ketones, otherwise negative.  Urine  culture:  No growth.  EKG May 04, 2006:  Normal sinus rhythm, minimal  voltage criteria for LVH, borderline EKG.  Follow-up EKG June 08, 2006:  Normal sinus rhythm and sinus arrhythmia.  A 2-view chest June 03, 2006, scarring in right lower lobe is stable.  No active cardiopulmonary  disease.  Follow-up chest June 10, 2006.  Mild basilar atelectasis, no  active lung disease.  Two-view chest June 12, 2006, no active disease.   HOSPITAL COURSE:  Patient admitted to [facility], tolerated the  procedure well, later transferred from recovery room to orthopedic floor.  Started on PC and p.o. analgesic pain control following surgery.  Given 24  hours post-op IV antibiotics, had a fairly decent night through surgery,  denied any chest pain.  Stayed in recovery through the night and then went  up to the floor the next morning.  Seen in rounds.  Hemovac drain placed at  the time of surgery, __________ .  A little bit of electrolyte imbalance  with a low sodium and low potassium.  Was given potassium supplements and  fluids were switched.  Resumed her Metformin.  BUN and  creatinine were doing  okay.  Starting to take in p.o. as well.  Put on sliding coverage.  Started  getting up out of bed that afternoon.  By day 2, she was doing much better.  She was a little drowsy.  Had some nausea on the evening of day 1, but that  was better by day 2.  Dressing was changed.  Incision looked good.  Pain  pump was removed.  Hemoglobin was 10.  Electrolyte imbalance had started to  improved.  Sodium was coming up, and her potassium was stable.  Blood  pressure was a little elevated, but she had not had her blood pressure  medications.  Started getting up with physical therapy and initially started  out slow requiring max assist.  Rehab services were consulted.  Patient was  seen by rehab, felt to be appropriate for inpatient rehab stay.  She was  running some temps on the evening of day 2 and the morning of day 3.  Chest  x-ray was ordered.  Chest x-rays did show some mild basilar atelectasis but  no lung disease.  Urine cultures were checked.  UA was essentially negative,  except for ketones, and the urine culture was also sent off.  Encouraged  pulmonary toilette and mobility.  By the following day of day 4 on June 12, 2006, patient was doing a little bit better, felt to be more alert.  Still in some low grade temps that were cycling up at night but coming back  down during the day.  UA did not show any obvious urinary tract infection.  Had a little bit of yeast, so we gave her 1 dose of Diflucan for coverage.  Dressing changed continued daily.  Likely post-op fevers were probably from  her lungs and continued to encourage pulmonary toilet.  They were working on  her disposition to find out if she was going to be going to rehab versus a  short-term skilled facility.  Though she did start to progress with her  therapy and started getting up and ambulating approximately 45 feet, and then continued to progress through the weekend and got to a point where she  was  even ambulating 120 feet.  Temperatures improved.  She  continued to  receive therapy, and by June 15, 2006, patient had progressed with her  physical therapy doing well, tolerating her meds.  Temperatures have  resolved, and at that point she was improved and wanted to go home.   DISCHARGE PLANNING:  The patient was discharged home on June 15, 2006.   DISCHARGE DIAGNOSES:  Please see above.   DISCHARGE MEDICATIONS:  Coumadin, Percocet, Robaxin.   DISCHARGE DIET:  Resume previous home diet.   ACTIVITY:  Total knee protocol.  Weightbearing as tolerated.  Home health  PT, home health nursing.   FOLLOW UP:  Two weeks.   DISPOSITION:  Home.   CONDITION ON DISCHARGE:  Improving.      Norma [name], P.A.      Gaynelle Arabian, M.D.  Electronically Signed    ALP/MEDQ  D:  07/08/2006  T:  07/09/2006  Job:  [id number]   cc:   Gaynelle Arabian, M.D.  Fax: [phone]   Satira Sark, MD  [phone]  [place], [place] [address]   Odette Fraction, NP   Charlett Blake, M.D.  Fax: [phone]

## 2011-04-15 ENCOUNTER — Ambulatory Visit (INDEPENDENT_AMBULATORY_CARE_PROVIDER_SITE_OTHER): Payer: BC Managed Care – PPO | Admitting: Cardiology

## 2011-04-15 ENCOUNTER — Encounter: Payer: Self-pay | Admitting: Cardiology

## 2011-04-15 VITALS — BP 109/67 | HR 85 | Resp 18 | Ht 62.0 in | Wt 127.8 lb

## 2011-04-15 DIAGNOSIS — N183 Chronic kidney disease, stage 3 unspecified: Secondary | ICD-10-CM

## 2011-04-15 DIAGNOSIS — I420 Dilated cardiomyopathy: Secondary | ICD-10-CM

## 2011-04-15 DIAGNOSIS — I428 Other cardiomyopathies: Secondary | ICD-10-CM

## 2011-04-15 DIAGNOSIS — I959 Hypotension, unspecified: Secondary | ICD-10-CM

## 2011-04-15 DIAGNOSIS — I509 Heart failure, unspecified: Secondary | ICD-10-CM

## 2011-04-15 DIAGNOSIS — I2789 Other specified pulmonary heart diseases: Secondary | ICD-10-CM

## 2011-04-15 DIAGNOSIS — I272 Pulmonary hypertension, unspecified: Secondary | ICD-10-CM

## 2011-04-15 DIAGNOSIS — R0602 Shortness of breath: Secondary | ICD-10-CM

## 2011-04-15 DIAGNOSIS — I2510 Atherosclerotic heart disease of native coronary artery without angina pectoris: Secondary | ICD-10-CM

## 2011-04-15 NOTE — Assessment & Plan Note (Signed)
Status post Tako-tsubo cardiomyopathy with improvement in ejection fraction from 20-25% now to 50-55%. Digoxin has been discontinued because of bradycardia and given the fact that the patient's ejection fraction has recovered I agree that there is no indication for this medication.

## 2011-04-15 NOTE — Assessment & Plan Note (Signed)
Followup electrolyte panel and BNP panel has been scheduled.

## 2011-04-15 NOTE — Progress Notes (Signed)
HPI The patient is a 77 year old female with a prior history of presumed Tako-tsubo cardiomyopathy, evaluated with a Cardiolite study without any definite inferior ischemia. Previously she had an ejection fraction of 20-25% with pulmonary hypertension and renal insufficiency. She had a catheterization in 2008 with no significant coronary artery disease. She required admission for acute heart failure as well as intravenous dobutamine or low output symptoms associated with nausea and vomiting. The patient has improved significantly in the interim. During her last office visit bedside echocardiogram revealed an ejection fraction of 40-45%. Today her ejection fraction appears to have further improved to about 50-55%. She reports no symptoms of heart failure. She reports no chest pain orthopnea or PND. During the last office visit we noted that her blood pressure was relatively low and her potassium level was elevated. Lisinopril and Aldactone were discontinued. Followup potassium was 4.6. Also in the interim digoxin has been held by primary care physician due to bradycardia. The patient kept meticulous records of her blood pressure and heart rates and heart rate and blood pressure well with normal limits. The patient reports no new cardiac related symptoms. She reports no orthopnea PND observations or syncope.  No Known Allergies  Current Outpatient Prescriptions on File Prior to Visit  Medication Sig Dispense Refill  . carvedilol (COREG) 3.125 MG tablet Take 1 tablet (3.125 mg total) by mouth 2 (two) times daily with a meal.  60 tablet  6  . FLUoxetine (PROZAC) 20 MG capsule Take one by mouth three times daily       . furosemide (LASIX) 40 MG tablet Take 1 tablet (40 mg total) by mouth daily.  30 tablet  11  . glipiZIDE (GLUCOTROL) 5 MG tablet Take one by mouth daily       . insulin detemir (LEVEMIR) 100 UNIT/ML injection Inject 14 Units into the skin at bedtime.       Marland Kitchen oxybutynin (DITROPAN) 5 MG tablet  Take one by mouth twice daily       . oxyCODONE (OXYCONTIN) 10 MG 12 hr tablet Take 1-2 by mouth four times daily as needed for severe pain        . PLAVIX 75 MG tablet Take one by mouth daily       . prochlorperazine (COMPAZINE) 5 MG tablet Take 5 mg by mouth every 6 (six) hours as needed.       . simvastatin (ZOCOR) 20 MG tablet Take one by mouth daily       . digoxin (LANOXIN) 0.125 MG tablet Take 1 tablet (125 mcg total) by mouth daily.  30 tablet  11    Past Medical History  Diagnosis Date  . Unspecified pleural effusion   . Acute systolic heart failure   . Other primary cardiomyopathies   . Congestive heart failure, unspecified   . Other specified forms of chronic ischemic heart disease   . Coronary atherosclerosis of native coronary artery   . Unspecified hypertensive kidney disease with chronic kidney disease stage I through stage IV, or unspecified   . Type II or unspecified type diabetes mellitus with neurological manifestations, uncontrolled   . Chronic kidney disease, unspecified   . Anemia of other chronic disease   . Other chronic pulmonary heart diseases   . Hypoxemia   . Chronic pain syndrome     Past Surgical History  Procedure Date  . Cholecystectomy   . Appendectomy   . Abdominal hysterectomy   . Bilateral knee surgery   . Cataract extraction,  bilateral   . Tonsillectomy     Family History  Problem Relation Age of Onset  . Heart attack Father 11    MI    History   Social History  . Marital Status: Married    Spouse Name: N/A    Number of Children: N/A  . Years of Education: N/A   Occupational History  . RETIRED    Social History Main Topics  . Smoking status: Never Smoker   . Smokeless tobacco: Not on file  . Alcohol Use: No  . Drug Use: No  . Sexually Active: Not on file   Other Topics Concern  . Not on file   Social History Narrative  . No narrative on file   [id number] positives as outlined above. The remainder of the 18   point review of systems is negative  PHYSICAL EXAM BP 109/67  Pulse 85  Resp 18  Ht 5\' 2"  (1.575 m)  Wt 127 lb 12.8 oz (57.97 kg)  BMI 23.38 kg/m2  SpO2 96%  General: Well-developed, well-nourished in no distress Head: Normocephalic and atraumatic Eyes:PERRLA/EOMI intact, conjunctiva and lids normal Ears: No deformity or lesions Mouth:normal dentition, normal posterior pharynx Neck: Supple, no JVD.  No masses, thyromegaly or abnormal cervical nodes Lungs: Normal breath sounds bilaterally without wheezing.  Normal percussion Cardiac: regular rate and rhythm with normal S1 and S2, no S3 or S4.  PMI is normal.  No pathological murmurs Abdomen: Normal bowel sounds, abdomen is soft and nontender without masses, organomegaly or hernias noted.  No hepatosplenomegaly MSK: Back normal, normal gait muscle strength and tone normal Vascular: Pulse is normal in all 4 extremities Extremities: No peripheral pitting edema Neurologic: Alert and oriented x 3 Skin: Intact without lesions or rashes Lymphatics: No significant adenopathy Psychologic: Normal affect   ECG:not available  Limited bedside echocardiogram: Ejection fraction 50-55%, no gross wall motion abnormalities.  ASSESSMENT AND PLAN

## 2011-04-15 NOTE — Assessment & Plan Note (Signed)
No evidence of coronary artery disease by catheterization in 2008 as well as a more recent negative Cardiolite stress study.

## 2011-04-15 NOTE — Assessment & Plan Note (Signed)
Resolved

## 2011-04-15 NOTE — Assessment & Plan Note (Signed)
No shortness of breath. No prior history of pulmonary embolism and negative ventilation perfusion scan. Continue medical therapy.

## 2011-04-15 NOTE — Assessment & Plan Note (Signed)
Blood pressure improved after discontinuation of lisinopril and Aldactone

## 2011-04-15 NOTE — Patient Instructions (Signed)
Your physician wants you to follow-up in: 6 months. You will receive a reminder letter in the mail one-two months in advance. If you don't receive a letter, please call our office to schedule the follow-up appointment. Your physician recommends that you continue on your current medications as directed. Please refer to the Current Medication list given to you today. Your physician recommends that you go to the [facility] for lab work: BMET/BNP. If the results of your test are normal or stable, you will receive a letter. If they are abnormal, the nurse will contact you by phone.

## 2011-06-13 LAB — URINALYSIS, ROUTINE W REFLEX MICROSCOPIC
Bilirubin Urine: NEGATIVE
Hgb urine dipstick: NEGATIVE
Specific Gravity, Urine: 1.029
pH: 6

## 2011-06-13 LAB — URINE MICROSCOPIC-ADD ON

## 2011-06-13 LAB — COMPREHENSIVE METABOLIC PANEL
AST: 16
CO2: 31
Calcium: 9.3
Chloride: 102
Creatinine, Ser: 0.56
GFR calc Af Amer: >60
GFR calc non Af Amer: >60
Glucose, Bld: 236 — ABNORMAL HIGH
Total Bilirubin: 1

## 2011-06-13 LAB — GLUCOSE, CAPILLARY
Glucose-Capillary: 113 — ABNORMAL HIGH
Glucose-Capillary: 117 — ABNORMAL HIGH
Glucose-Capillary: 205 — ABNORMAL HIGH
Glucose-Capillary: 220 — ABNORMAL HIGH
Glucose-Capillary: 83

## 2011-06-13 LAB — TYPE AND SCREEN: ABO/RH(D): A POS

## 2011-06-13 LAB — PROTIME-INR
INR: 1
Prothrombin Time: 13.1

## 2011-06-13 LAB — DIFFERENTIAL
Basophils Absolute: 0.1
Eosinophils Absolute: 0.1
Eosinophils Relative: 1
Lymphocytes Relative: 34
Lymphs Abs: 2.9
Neutrophils Relative %: 59

## 2011-06-13 LAB — URINE CULTURE
Colony Count: >=100000
Special Requests: NEGATIVE

## 2011-06-13 LAB — CBC
Hemoglobin: 12.5
RBC: 4.58
RDW: 14.4
WBC: 8.5

## 2011-06-18 ENCOUNTER — Ambulatory Visit: Payer: BC Managed Care – PPO | Admitting: Cardiology

## 2011-06-23 ENCOUNTER — Other Ambulatory Visit: Payer: Self-pay | Admitting: Cardiology

## 2011-12-22 ENCOUNTER — Other Ambulatory Visit: Payer: Self-pay | Admitting: Cardiology

## 2012-01-04 ENCOUNTER — Encounter: Payer: Self-pay | Admitting: Physician Assistant

## 2012-01-05 ENCOUNTER — Other Ambulatory Visit: Payer: Self-pay | Admitting: Physician Assistant

## 2012-01-05 ENCOUNTER — Encounter: Payer: Self-pay | Admitting: Physician Assistant

## 2012-01-05 DIAGNOSIS — R079 Chest pain, unspecified: Secondary | ICD-10-CM

## 2012-02-05 ENCOUNTER — Encounter: Payer: Self-pay | Admitting: Physician Assistant

## 2012-02-05 ENCOUNTER — Ambulatory Visit (INDEPENDENT_AMBULATORY_CARE_PROVIDER_SITE_OTHER): Payer: Medicare Other | Admitting: Physician Assistant

## 2012-02-05 VITALS — BP 109/70 | HR 62 | Ht 62.0 in | Wt 158.0 lb

## 2012-02-05 DIAGNOSIS — R0989 Other specified symptoms and signs involving the circulatory and respiratory systems: Secondary | ICD-10-CM

## 2012-02-05 DIAGNOSIS — I420 Dilated cardiomyopathy: Secondary | ICD-10-CM

## 2012-02-05 DIAGNOSIS — R911 Solitary pulmonary nodule: Secondary | ICD-10-CM

## 2012-02-05 DIAGNOSIS — I428 Other cardiomyopathies: Secondary | ICD-10-CM

## 2012-02-05 DIAGNOSIS — R002 Palpitations: Secondary | ICD-10-CM

## 2012-02-05 NOTE — Progress Notes (Signed)
HPI: Patient presents for post hospital followup from [facility], following recent consultation for evaluation of atypical CP. Serial cardiac markers were negative. 2-D echo yielded EF 0000000 with diastolic dysfunction, moderate MR. Of note, admission CXR notable for 1.9 cm RLL nodular opacity; followup nonemergent chest CT was recommended.  Lab data notable for normal troponins, stable CKD with creatinine 1.5, potassium 3.0 with normal magnesium. TSH low normal, with elevated T4 1.64. LDL 79.  Patient also presented with complaint of tachycardia palpitations, and we recommended an outpatient 21 day event monitor to rule out dysrhythmia or significant bradycardia. Admission EKG yielded sinus bradycardia 56 bpm with no acute changes. No dysrhythmia was noted during her hospitalization.  Clinically, she remains quite weak and easily fatigued. She denies any frank exertional CP, but does have DOE. However, she does not suggest symptoms consistent with CHF.  Patient had recent blood work drawn at Dr. Antonietta Barcelona office. No medication adjustments were made.    No Known Allergies  Current Outpatient Prescriptions  Medication Sig Dispense Refill  . carvedilol (COREG) 3.125 MG tablet Take 1 tablet (3.125 mg total) by mouth 2 (two) times daily with a meal.  60 each  0  . clonazePAM (KLONOPIN) 0.5 MG tablet Take 0.5 mg by mouth at bedtime.      . ferrous sulfate 325 (65 FE) MG tablet Take 325 mg by mouth daily with breakfast.      . FLUoxetine (PROZAC) 20 MG capsule Take one by mouth three times daily       . furosemide (LASIX) 40 MG tablet Take 40 mg by mouth daily.      Marland Kitchen glipiZIDE (GLUCOTROL) 5 MG tablet Take one by mouth daily       . insulin detemir (LEVEMIR) 100 UNIT/ML injection Inject 14 Units into the skin at bedtime.       Marland Kitchen oxybutynin (DITROPAN) 5 MG tablet Take 5 mg by mouth 3 (three) times daily. Take one by mouth twice daily       . PLAVIX 75 MG tablet Take one by mouth daily       . potassium  chloride SA (K-DUR,KLOR-CON) 20 MEQ tablet Take 20 mEq by mouth daily.      . simvastatin (ZOCOR) 20 MG tablet Take one by mouth daily       . DISCONTD: furosemide (LASIX) 40 MG tablet Take 1 tablet (40 mg total) by mouth daily.  30 tablet  11    Past Medical History  Diagnosis Date  . Unspecified pleural effusion   . Acute systolic heart failure   . Other primary cardiomyopathies   . Congestive heart failure, unspecified   . Other specified forms of chronic ischemic heart disease   . Coronary atherosclerosis of native coronary artery   . Unspecified hypertensive kidney disease with chronic kidney disease stage I through stage IV, or unspecified   . Type II or unspecified type diabetes mellitus with neurological manifestations, uncontrolled   . Chronic kidney disease, unspecified   . Anemia of other chronic disease   . Other chronic pulmonary heart diseases   . Hypoxemia   . Chronic pain syndrome     Past Surgical History  Procedure Date  . Cholecystectomy   . Appendectomy   . Abdominal hysterectomy   . Bilateral knee surgery   . Cataract extraction, bilateral   . Tonsillectomy     History   Social History  . Marital Status: Married    Spouse Name: N/A    Number  of Children: N/A  . Years of Education: N/A   Occupational History  . RETIRED    Social History Main Topics  . Smoking status: Never Smoker   . Smokeless tobacco: Never Used  . Alcohol Use: No  . Drug Use: No  . Sexually Active: Not on file   Other Topics Concern  . Not on file   Social History Narrative  . No narrative on file    Family History  Problem Relation Age of Onset  . Heart attack Father 42    MI    ROS: no nausea, vomiting; no fever, chills; no melena, hematochezia; no claudication  PHYSICAL EXAM: BP 109/70  Pulse 62  Ht 5\' 2"  (1.575 m)  Wt 158 lb (71.668 kg)  BMI 28.90 kg/m2 GENERAL: 78 year old female, frail appearing; NAD HEENT: NCAT, PERRLA, EOMI; sclera clear; no  xanthelasma NECK: palpable bilateral carotid pulses, no bruits; no JVD; no TM LUNGS: CTA bilaterally CARDIAC: RRR (S1, S2); no significant murmurs; no rubs or gallops ABDOMEN: soft, non-tender; intact BS EXTREMETIES: intact distal pulses; no significant peripheral edema SKIN: warm/dry; no obvious rash/lesions MUSCULOSKELETAL: no joint deformity NEURO: no focal deficit; NL affect   EKG:   ASSESSMENT & PLAN:  CAD (coronary artery disease) No further cardiac workup indicated at this time. Patient's symptoms are essentially that of generalized fatigue and weakness. She recently ruled out for MI, and had a 2-D echo indicating preserved LVF, with diastolic dysfunction, inferior wall HK, and moderate MR. Her last ischemic evaluation was a [employer], 3/12, which indicated prior infarct with no definite ischemia; EF 33%. Her last echo, at that same time, yielded EF 20-25% with global HK. For now, we'll continue conservative management with early clinic followup with myself/Dr. Degent.   Palpitations Will proceed with our recent recommendation to further evaluate with a 21 day event monitor, to rule out dysrhythmia. Patient still has occasional palpitations, as recently reported. We'll also request recent labs from Dr. Antonietta Barcelona office. Recent TSH level in hospital was low normal at 0.35, with an elevated T4 1.64. If no followup labs have been done, we'll order a free T3 level.  Dilated cardiomyopathy Patient has history of Tako tsubo CM. Recent 2-D echo indicated preserved LVF (EF 0000000), with diastolic dysfunction. Patient currently taking Coreg only on a daily basis, which we will titrate to twice a day dosing. She will need close monitoring of BP/HR.  Pulmonary nodule Will refer patient to Dr. Linde Gillis for evaluation of recently documented 1.9 cm right lung nodule. Of note, patient reports no significant history of tobacco smoking.     Gene Kellin Bartling, PAC

## 2012-02-05 NOTE — Patient Instructions (Signed)
Your physician recommends that you schedule a follow-up appointment in: 1 month. You will be given this appointment at the check out desk.  Your physician has recommended you make the following change in your medication: INCREASE CARVEDILOL 3.125 MG TO TWICE DAILY.  Your physician has recommended that you wear an event monitor. Event monitors are medical devices that record the heart's electrical activity. Doctors most often [place] these monitors to diagnose arrhythmias. Arrhythmias are problems with the speed or rhythm of the heartbeat. The monitor is a small, portable device. You can wear one while you do your normal daily activities. This is usually used to diagnose what is causing palpitations/syncope (passing out). You will be contacted by [employer].  You have been referred to Dr. Hans Eden office.  We will be requesting your lab work from Dr. Antonietta Barcelona office.

## 2012-02-05 NOTE — Assessment & Plan Note (Signed)
Will refer patient to Dr. Linde Gillis for evaluation of recently documented 1.9 cm right lung nodule. Of note, patient reports no significant history of tobacco smoking.

## 2012-02-05 NOTE — Assessment & Plan Note (Addendum)
Patient has history of Tako tsubo CM. Recent 2-D echo indicated preserved LVF (EF 0000000), with diastolic dysfunction. Patient currently taking Coreg only on a daily basis, which we will titrate to twice a day dosing. She will need close monitoring of BP/HR.

## 2012-02-05 NOTE — Assessment & Plan Note (Signed)
No further cardiac workup indicated at this time. Patient's symptoms are essentially that of generalized fatigue and weakness. She recently ruled out for MI, and had a 2-D echo indicating preserved LVF, with diastolic dysfunction, inferior wall HK, and moderate MR. Her last ischemic evaluation was a [employer], 3/12, which indicated prior infarct with no definite ischemia; EF 33%. Her last echo, at that same time, yielded EF 20-25% with global HK. For now, we'll continue conservative management with early clinic followup with myself/Dr. Degent.

## 2012-02-05 NOTE — Assessment & Plan Note (Signed)
Will proceed with our recent recommendation to further evaluate with a 21 day event monitor, to rule out dysrhythmia. Patient still has occasional palpitations, as recently reported. We'll also request recent labs from Dr. Antonietta Barcelona office. Recent TSH level in hospital was low normal at 0.35, with an elevated T4 1.64. If no followup labs have been done, we'll order a free T3 level.

## 2012-02-11 DIAGNOSIS — R002 Palpitations: Secondary | ICD-10-CM

## 2012-03-08 ENCOUNTER — Encounter: Payer: Self-pay | Admitting: Physician Assistant

## 2012-03-08 ENCOUNTER — Other Ambulatory Visit: Payer: Self-pay | Admitting: Cardiology

## 2012-03-08 ENCOUNTER — Encounter: Payer: Self-pay | Admitting: *Deleted

## 2012-03-08 ENCOUNTER — Ambulatory Visit (INDEPENDENT_AMBULATORY_CARE_PROVIDER_SITE_OTHER): Payer: Medicare Other | Admitting: Physician Assistant

## 2012-03-08 VITALS — BP 135/75 | HR 56 | Ht 62.0 in | Wt 157.0 lb

## 2012-03-08 DIAGNOSIS — R911 Solitary pulmonary nodule: Secondary | ICD-10-CM

## 2012-03-08 DIAGNOSIS — I420 Dilated cardiomyopathy: Secondary | ICD-10-CM

## 2012-03-08 DIAGNOSIS — I428 Other cardiomyopathies: Secondary | ICD-10-CM

## 2012-03-08 DIAGNOSIS — R002 Palpitations: Secondary | ICD-10-CM

## 2012-03-08 DIAGNOSIS — R0602 Shortness of breath: Secondary | ICD-10-CM

## 2012-03-08 DIAGNOSIS — R0609 Other forms of dyspnea: Secondary | ICD-10-CM

## 2012-03-08 MED ORDER — CARVEDILOL 3.125 MG PO TABS: 3.1250 mg | ORAL_TABLET | Freq: Two times a day (BID) | ORAL | Status: DC

## 2012-03-08 NOTE — Progress Notes (Signed)
HPI: Patient presents for scheduled followup.  When last seen, we ordered a 21 day event monitor for evaluation of palpitations, as well as rule out of of dysrhythmia or significant bradycardia. EKG at last OV yielded SB at 56 bpm. This current study yielded sinus bradycardia at 50 bpm range, with no definite dysrhythmia.  Clinically, she continues to report no CP, since most recent hospitalization here at [facility]. Again, she ruled out for MI, and had a 2-D echo (EF 0000000), with diastolic dysfunction, and moderate MR. What she continues to complain of, however, is easy fatigability and DOE. She denies symptoms suggestive of CHF. She suggests some exacerbation in recent months. Again, she denies exertional CP, tachycardia palpitations. She essentially states that she "gives out".   No Known Allergies  Current Outpatient Prescriptions  Medication Sig Dispense Refill  . carvedilol (COREG) 3.125 MG tablet Take 1 tablet (3.125 mg total) by mouth 2 (two) times daily.  60 tablet  6  . clonazePAM (KLONOPIN) 0.5 MG tablet Take 0.5 mg by mouth 3 (three) times daily.       . ferrous sulfate 325 (65 FE) MG tablet Take 325 mg by mouth daily with breakfast.      . FLUoxetine (PROZAC) 20 MG capsule Take one by mouth three times daily       . furosemide (LASIX) 40 MG tablet Take 40 mg by mouth 2 (two) times daily.       Marland Kitchen glipiZIDE (GLUCOTROL) 5 MG tablet Take one by mouth daily       . insulin detemir (LEVEMIR) 100 UNIT/ML injection Inject 14 Units into the skin at bedtime.       Marland Kitchen lisinopril (PRINIVIL,ZESTRIL) 2.5 MG tablet Take 2.5 mg by mouth daily.      . ondansetron (ZOFRAN) 8 MG tablet Take 8 mg by mouth every 8 (eight) hours as needed.      Marland Kitchen oxybutynin (DITROPAN) 5 MG tablet Take 5 mg by mouth daily.       Marland Kitchen oxycodone (ROXICODONE) 30 MG immediate release tablet Take 30 mg by mouth every 4 (four) hours as needed.      Marland Kitchen PLAVIX 75 MG tablet Take one by mouth daily       . potassium chloride SA  (K-DUR,KLOR-CON) 20 MEQ tablet Take 20 mEq by mouth daily.      . simvastatin (ZOCOR) 20 MG tablet Take one by mouth daily       . DISCONTD: carvedilol (COREG) 3.125 MG tablet Take 1 tablet (3.125 mg total) by mouth 2 (two) times daily with a meal.  60 each  0    Past Medical History  Diagnosis Date  . Unspecified pleural effusion   . Acute systolic heart failure   . Other primary cardiomyopathies   . Congestive heart failure, unspecified   . Other specified forms of chronic ischemic heart disease   . Coronary atherosclerosis of native coronary artery   . Unspecified hypertensive kidney disease with chronic kidney disease stage I through stage IV, or unspecified   . Type II or unspecified type diabetes mellitus with neurological manifestations, uncontrolled   . Chronic kidney disease, unspecified   . Anemia of other chronic disease   . Other chronic pulmonary heart diseases   . Hypoxemia   . Chronic pain syndrome     Past Surgical History  Procedure Date  . Cholecystectomy   . Appendectomy   . Abdominal hysterectomy   . Bilateral knee surgery   . Cataract extraction,  bilateral   . Tonsillectomy     History   Social History  . Marital Status: Married    Spouse Name: N/A    Number of Children: N/A  . Years of Education: N/A   Occupational History  . RETIRED    Social History Main Topics  . Smoking status: Never Smoker   . Smokeless tobacco: Never Used  . Alcohol Use: No  . Drug Use: No  . Sexually Active: Not on file   Other Topics Concern  . Not on file   Social History Narrative  . No narrative on file    Family History  Problem Relation Age of Onset  . Heart attack Father 50    MI    ROS: no nausea, vomiting; no fever, chills; no melena, hematochezia; no claudication  PHYSICAL EXAM: BP 135/75  Pulse 56  Ht 5\' 2"  (1.575 m)  Wt 157 lb (71.215 kg)  BMI 28.72 kg/m2  SpO2 96% GENERAL: 78 year old female, frail appearing; NAD  HEENT: NCAT, PERRLA,  EOMI; sclera clear; no xanthelasma  NECK: palpable bilateral carotid pulses, no bruits; no JVD; no TM  LUNGS: CTA bilaterally  CARDIAC: RRR (S1, S2); no significant murmurs; no rubs or gallops  ABDOMEN: soft, non-tender; intact BS  EXTREMETIES: intact distal pulses; no significant peripheral edema  SKIN: warm/dry; no obvious rash/lesions  MUSCULOSKELETAL: no joint deformity  NEURO: no focal deficit; NL affect       EKG:    ASSESSMENT & PLAN:  Dyspnea on exertion We'll order a Lexiscan stress Myoview for risk stratification, and rule out of occult ischemia. Patient has history of nonobstructive CAD by prior catheterizations, and prior diagnosis of Tako Tsubo cardiomyopathy. However, I am now concerned that her symptoms may represent an anginal equivalent. Her last ischemic evaluation was a negative Lexiscan, 3/12; EF 33%. Again, most recent echo yielded EF 0000000, with diastolic dysfunction, and moderate MR.  Pulmonary nodule Patient has been scheduled to follow up with Dr. Linde Gillis for further evaluation, by the end of this month.  Palpitations Recent 21 day event monitor yielded no dysrhythmia, only SB in the 50 bpm range. Patient denies any palpitations. No further workup currently indicated.  Dilated cardiomyopathy Continue current dose of Coreg, which was just recently uptitrated. Unable to further increase, secondary to resting SB.    Gene Elner Seifert, PAC

## 2012-03-08 NOTE — Assessment & Plan Note (Signed)
We'll order a Lexiscan stress Myoview for risk stratification, and rule out of occult ischemia. Patient has history of nonobstructive CAD by prior catheterizations, and prior diagnosis of Tako Tsubo cardiomyopathy. However, I am now concerned that her symptoms may represent an anginal equivalent. Her last ischemic evaluation was a negative Lexiscan, 3/12; EF 33%. Again, most recent echo yielded EF 0000000, with diastolic dysfunction, and moderate MR.

## 2012-03-08 NOTE — Assessment & Plan Note (Signed)
Patient has been scheduled to follow up with Dr. Linde Gillis for further evaluation, by the end of this month.

## 2012-03-08 NOTE — Patient Instructions (Addendum)
   Lexiscan Myoview (stress test)  Office will contact with results Follow up in  1 month

## 2012-03-08 NOTE — Assessment & Plan Note (Signed)
Continue current dose of Coreg, which was just recently uptitrated. Unable to further increase, secondary to resting SB.

## 2012-03-08 NOTE — Assessment & Plan Note (Signed)
Recent 21 day event monitor yielded no dysrhythmia, only SB in the 50 bpm range. Patient denies any palpitations. No further workup currently indicated.

## 2012-03-11 ENCOUNTER — Other Ambulatory Visit: Payer: Self-pay | Admitting: Physician Assistant

## 2012-03-11 ENCOUNTER — Telehealth: Payer: Self-pay | Admitting: Physician Assistant

## 2012-03-11 DIAGNOSIS — R0602 Shortness of breath: Secondary | ICD-10-CM

## 2012-03-11 NOTE — Telephone Encounter (Signed)
Rest Lexiscan Myoview SOB Scheduled 7/1 @ [facility]

## 2012-03-11 NOTE — Telephone Encounter (Signed)
AUTH # [id number] EXP 04/09/12

## 2012-03-15 DIAGNOSIS — R079 Chest pain, unspecified: Secondary | ICD-10-CM

## 2012-04-07 ENCOUNTER — Encounter: Payer: Self-pay | Admitting: *Deleted

## 2012-04-07 NOTE — Progress Notes (Signed)
Patient ID: Norma [name], female   DOB: November 07, 1933, 78 y.o.   MRN: [id number]  Myoview reviewed. Negative for ischemia, NL LVF. No further workup. Please inform pt. Gene -----    Message ----- From: Laurine Blazer, LPN Sent: [id number] PM To: Aurora Mask, PA Does not look like test routed back to nurse for patient notification.    =================================================  Patient notified of results by letter.

## 2012-04-08 ENCOUNTER — Encounter: Payer: Self-pay | Admitting: *Deleted

## 2012-04-08 ENCOUNTER — Encounter: Payer: Self-pay | Admitting: Physician Assistant

## 2012-04-08 ENCOUNTER — Other Ambulatory Visit: Payer: Self-pay | Admitting: *Deleted

## 2012-04-08 ENCOUNTER — Other Ambulatory Visit: Payer: Self-pay | Admitting: Physician Assistant

## 2012-04-08 ENCOUNTER — Telehealth: Payer: Self-pay

## 2012-04-08 ENCOUNTER — Ambulatory Visit (INDEPENDENT_AMBULATORY_CARE_PROVIDER_SITE_OTHER): Payer: Medicare Other | Admitting: Physician Assistant

## 2012-04-08 VITALS — BP 105/67 | HR 56 | Ht 62.0 in | Wt 164.0 lb

## 2012-04-08 DIAGNOSIS — R079 Chest pain, unspecified: Secondary | ICD-10-CM

## 2012-04-08 DIAGNOSIS — I2510 Atherosclerotic heart disease of native coronary artery without angina pectoris: Secondary | ICD-10-CM

## 2012-04-08 DIAGNOSIS — Z01810 Encounter for preprocedural cardiovascular examination: Secondary | ICD-10-CM

## 2012-04-08 DIAGNOSIS — R072 Precordial pain: Secondary | ICD-10-CM

## 2012-04-08 NOTE — Telephone Encounter (Signed)
No precert required.  [place]

## 2012-04-08 NOTE — Patient Instructions (Signed)
   Left JV heart cath - see info sheet given Continue all current medications.  Follow up will be given after above

## 2012-04-08 NOTE — Progress Notes (Signed)
Primary Cardiologist: Terald Sleeper, MD   HPI: Patient presents for early scheduled followup and review of recent [place], which I ordered to rule out occult ischemia. This was performed on July 1, reviewed by Dr. Dannielle Burn, and yielded no definite evidence of ischemia; EF 58% with normal wall motion.  The results of recent stress test were reviewed with the patient. Clinically, however, she is reporting to me today that she does have occasional exertional CP, with most recent episode occurring last week, after having swept the floors. She developed her most significant episode of CP to date (9/10), but chose not to seek medical attention. She described it as dull, mid sternal, with no associated radiation, dyspnea, nausea, or diaphoresis. She suggests that it is only with such moderately strenuous activity that she develops CP. She denies any rest angina. She continues to experience significant DOE and easy fatigability.  No Known Allergies  Current Outpatient Prescriptions  Medication Sig Dispense Refill  . carvedilol (COREG) 3.125 MG tablet Take 1 tablet (3.125 mg total) by mouth 2 (two) times daily.  60 tablet  6  . clonazePAM (KLONOPIN) 0.5 MG tablet Take 0.5 mg by mouth 3 (three) times daily.       . ferrous sulfate 325 (65 FE) MG tablet Take 325 mg by mouth daily with breakfast.      . FLUoxetine (PROZAC) 20 MG capsule Take one by mouth three times daily       . furosemide (LASIX) 40 MG tablet Take 40 mg by mouth 2 (two) times daily.       Marland Kitchen glipiZIDE (GLUCOTROL) 5 MG tablet Take one by mouth daily       . insulin detemir (LEVEMIR) 100 UNIT/ML injection Inject 14 Units into the skin at bedtime.       Marland Kitchen lisinopril (PRINIVIL,ZESTRIL) 2.5 MG tablet Take 2.5 mg by mouth daily.      . ondansetron (ZOFRAN) 8 MG tablet Take 8 mg by mouth every 8 (eight) hours as needed.      Marland Kitchen oxybutynin (DITROPAN) 5 MG tablet Take 5 mg by mouth daily.       Marland Kitchen oxycodone (ROXICODONE) 30 MG immediate  release tablet Take 30 mg by mouth every 4 (four) hours as needed.      Marland Kitchen PLAVIX 75 MG tablet Take one by mouth daily       . potassium chloride SA (K-DUR,KLOR-CON) 20 MEQ tablet Take 20 mEq by mouth daily.      . simvastatin (ZOCOR) 20 MG tablet Take one by mouth daily         Past Medical History  Diagnosis Date  . Unspecified pleural effusion   . Acute systolic heart failure   . Other primary cardiomyopathies   . Congestive heart failure, unspecified   . Other specified forms of chronic ischemic heart disease   . Coronary atherosclerosis of native coronary artery   . Unspecified hypertensive kidney disease with chronic kidney disease stage I through stage IV, or unspecified   . Type II or unspecified type diabetes mellitus with neurological manifestations, uncontrolled   . Chronic kidney disease, unspecified   . Anemia of other chronic disease   . Other chronic pulmonary heart diseases   . Hypoxemia   . Chronic pain syndrome     Past Surgical History  Procedure Date  . Cholecystectomy   . Appendectomy   . Abdominal hysterectomy   . Bilateral knee surgery   . Cataract extraction, bilateral   .  Tonsillectomy     History   Social History  . Marital Status: Married    Spouse Name: N/A    Number of Children: N/A  . Years of Education: N/A   Occupational History  . RETIRED    Social History Main Topics  . Smoking status: Never Smoker   . Smokeless tobacco: Never Used  . Alcohol Use: No  . Drug Use: No  . Sexually Active: Not on file   Other Topics Concern  . Not on file   Social History Narrative  . No narrative on file   Social History Narrative  . No narrative on file    Problem Relation Age of Onset  . Heart attack Father 11    MI    ROS: no nausea, vomiting; no fever, chills; no melena, hematochezia; no claudication  PHYSICAL EXAM: BP 105/67  Pulse 56  Ht 5\' 2"  (1.575 m)  Wt 164 lb (74.39 kg)  BMI 30.00 kg/m2  SpO2 95% GENERAL: 78 year old  female, frail appearing; NAD  HEENT: NCAT, PERRLA, EOMI; sclera clear; no xanthelasma  NECK: palpable bilateral carotid pulses, no bruits; no JVD; no TM  LUNGS: CTA bilaterally  CARDIAC: RRR (S1, S2); no significant murmurs; no rubs or gallops  ABDOMEN: soft, non-tender; intact BS  EXTREMETIES: intact distal pulses; no significant peripheral edema  SKIN: warm/dry; no obvious rash/lesions  MUSCULOSKELETAL: no joint deformity  NEURO: no focal deficit; NL affect     EKG:    ASSESSMENT & PLAN:  CAD (coronary artery disease) Despite recent negative Lexiscan Myoview, patient's symptoms are worrisome for significant CAD progression. Her last catheterization was in 2008, notable for 70% distal CFX stenosis, with otherwise moderate nonobstructive disease; EF 65%, no focal WMAs. Most recent echo here indicated EF 0000000, with diastolic dysfunction, and moderate MR. Clinically, the patient has significant DOE and easy fatigability, in addition to recent episodes of exertional CP. This apparently has been ongoing for quite sometime. Therefore, I recommended proceeding with diagnostic coronary angiography, with limited dye load (secondary to CKD), and without LV gram. The patient has agreed to proceed, and the risks/benefits were discussed, and plan improved today by Dr. Fletcher Anon.    Gene Angelise Petrich, PAC

## 2012-04-08 NOTE — Assessment & Plan Note (Addendum)
Despite recent negative Lexiscan Myoview, patient's symptoms are worrisome for significant CAD progression. Her last catheterization was in 2008, notable for 70% distal CFX stenosis, with otherwise moderate nonobstructive disease; EF 65%, no focal WMAs. Most recent echo here indicated EF 0000000, with diastolic dysfunction, and moderate MR. Clinically, the patient has significant DOE and easy fatigability, in addition to recent episodes of exertional CP. This apparently has been ongoing for quite sometime. Therefore, I recommended proceeding with diagnostic coronary angiography, with limited dye load (secondary to CKD), and without LV gram. The patient has agreed to proceed, and the risks/benefits were discussed, and plan improved today by Dr. Fletcher Anon.

## 2012-04-08 NOTE — Telephone Encounter (Signed)
Left JV cath scheduled for 7/31 - Arida - 11:30

## 2012-04-13 ENCOUNTER — Telehealth: Payer: Self-pay | Admitting: *Deleted

## 2012-04-13 NOTE — Telephone Encounter (Signed)
Patient cancelled cath for tomorrow, decided she did not want to have done.

## 2012-04-14 ENCOUNTER — Encounter (HOSPITAL_BASED_OUTPATIENT_CLINIC_OR_DEPARTMENT_OTHER): Admission: RE | Payer: Self-pay | Source: Ambulatory Visit

## 2012-04-14 ENCOUNTER — Inpatient Hospital Stay (HOSPITAL_BASED_OUTPATIENT_CLINIC_OR_DEPARTMENT_OTHER): Admission: RE | Admit: 2012-04-14 | Payer: Medicare Other | Source: Ambulatory Visit | Admitting: Cardiovascular Disease

## 2012-04-14 SURGERY — JV LEFT HEART CATHETERIZATION WITH CORONARY ANGIOGRAM
Anesthesia: Moderate Sedation

## 2012-04-14 NOTE — Telephone Encounter (Signed)
Noted  

## 2012-04-14 NOTE — Telephone Encounter (Signed)
Ok. Will let Gene know.

## 2012-11-25 ENCOUNTER — Other Ambulatory Visit (HOSPITAL_COMMUNITY): Payer: Self-pay | Admitting: Internal Medicine

## 2012-11-26 ENCOUNTER — Ambulatory Visit (HOSPITAL_COMMUNITY)
Admission: RE | Admit: 2012-11-26 | Discharge: 2012-11-26 | Disposition: A | Discharge status: Home / Self Care | Payer: Medicare Other | Source: Ambulatory Visit | Attending: Internal Medicine | Admitting: Internal Medicine

## 2012-11-26 DIAGNOSIS — R918 Other nonspecific abnormal finding of lung field: Secondary | ICD-10-CM

## 2012-11-26 DIAGNOSIS — R937 Abnormal findings on diagnostic imaging of other parts of musculoskeletal system: Secondary | ICD-10-CM | POA: Insufficient documentation

## 2012-11-26 DIAGNOSIS — R911 Solitary pulmonary nodule: Secondary | ICD-10-CM | POA: Insufficient documentation

## 2012-11-26 MED ORDER — IOHEXOL 300 MG/ML  SOLN
64.0000 mL | Freq: Once | INTRAMUSCULAR | Status: AC | PRN
  Administered 2012-11-26: 64 mL via INTRAVENOUS

## 2012-11-29 LAB — POCT I-STAT, CHEM 8
BUN: 25 mg/dL — ABNORMAL HIGH (ref 6–23)
Calcium, Ion: 1.07 mmol/L — ABNORMAL LOW (ref 1.13–1.30)
Creatinine, Ser: 1.6 mg/dL — ABNORMAL HIGH (ref 0.50–1.10)
Glucose, Bld: 242 mg/dL — ABNORMAL HIGH (ref 70–99)
TCO2: 29 mmol/L (ref 0–100)

## 2012-12-06 ENCOUNTER — Other Ambulatory Visit: Payer: Self-pay

## 2012-12-06 ENCOUNTER — Encounter: Payer: Self-pay | Admitting: Surgery

## 2012-12-06 ENCOUNTER — Institutional Professional Consult (permissible substitution) (INDEPENDENT_AMBULATORY_CARE_PROVIDER_SITE_OTHER): Payer: Medicare Other | Admitting: Surgery

## 2012-12-06 VITALS — BP 166/72 | HR 55 | Resp 16 | Ht 61.0 in | Wt 180.0 lb

## 2012-12-06 DIAGNOSIS — R51 Headache: Secondary | ICD-10-CM

## 2012-12-06 DIAGNOSIS — D381 Neoplasm of uncertain behavior of trachea, bronchus and lung: Secondary | ICD-10-CM

## 2012-12-06 DIAGNOSIS — R918 Other nonspecific abnormal finding of lung field: Secondary | ICD-10-CM

## 2012-12-06 DIAGNOSIS — R222 Localized swelling, mass and lump, trunk: Secondary | ICD-10-CM

## 2012-12-06 NOTE — Progress Notes (Signed)
[address]       [place] [address]             [phone]      PCP is Rosita Fire, MD Referring Provider is Rosita Fire, MD  Chief Complaint  Patient presents with  . Lung Mass    CT CHEST/[facility]    HPI:  The patient is a 79 year old woman with minimal smoking history in the distant past who is significantly disabled from prior spine surgery and a stroke about 5 years ago with left hemiparesis. She reports developing hemoptysis about 3 weeks ago which lasted for several days. She is no longer having the hemoptysis. She presented to her primary physician and a chest x-ray showed a right lower lobe lung mass. CT scan of the chest shows a 2.7 x 2.0 x 1.7 cm mass in the right lower lobe. This had been noted on chest x-ray dated 01/04/2012 but according to the patient's family she ignored it. The current CT scan shows a 2.1 cm mass in the posterior medial aspect of the dome of the right lobe of the liver and also an enhancing 13 mm mass in the anterior aspect of the left lobe of liver. In addition there are multiple small lucent areas in the thoracic spine suspicious for metastatic disease.  Past Medical History  Diagnosis Date  . Unspecified pleural effusion   . Acute systolic heart failure   . Other primary cardiomyopathies   . Congestive heart failure, unspecified   . Other specified forms of chronic ischemic heart disease   . Coronary atherosclerosis of native coronary artery   . Unspecified hypertensive kidney disease with chronic kidney disease stage I through stage IV, or unspecified(403.90)   . Type II or unspecified type diabetes mellitus with neurological manifestations, uncontrolled(250.62)   . Chronic kidney disease, unspecified   . Anemia of other chronic disease   . Other chronic pulmonary heart diseases   . Hypoxemia   . Chronic pain syndrome   . Stroke     Past Surgical History  Procedure Laterality Date  . Cholecystectomy    .  Appendectomy    . Abdominal hysterectomy    . Bilateral knee surgery    . Cataract extraction, bilateral    . Tonsillectomy      Family History  Problem Relation Age of Onset  . Heart attack Father 53    MI  . Cancer Father     leukemia  . Cancer Brother   . Heart disease Daughter     Social History History  Substance Use Topics  . Smoking status: Never Smoker   . Smokeless tobacco: Never Used  . Alcohol Use: No    Current Outpatient Prescriptions  Medication Sig Dispense Refill  . carvedilol (COREG) 3.125 MG tablet Take 1 tablet (3.125 mg total) by mouth 2 (two) times daily.  60 tablet  6  . clonazePAM (KLONOPIN) 0.5 MG tablet Take 0.5 mg by mouth 3 (three) times daily.       . ferrous sulfate 325 (65 FE) MG tablet Take 325 mg by mouth daily with breakfast.      . FLUoxetine (PROZAC) 20 MG capsule Take one by mouth three times daily       . furosemide (LASIX) 40 MG tablet Take 40 mg by mouth 2 (two) times daily.       Marland Kitchen glipiZIDE (GLUCOTROL) 5 MG tablet Take one by mouth daily       .  HYDROcodone-acetaminophen (NORCO/VICODIN) 5-325 MG per tablet Take 1 tablet by mouth every 6 (six) hours as needed for pain.      Marland Kitchen insulin detemir (LEVEMIR) 100 UNIT/ML injection Inject 14 Units into the skin at bedtime.       Marland Kitchen lisinopril (PRINIVIL,ZESTRIL) 2.5 MG tablet Take 2.5 mg by mouth daily.      . ondansetron (ZOFRAN) 8 MG tablet Take 8 mg by mouth every 8 (eight) hours as needed.      Marland Kitchen oxybutynin (DITROPAN) 5 MG tablet Take 5 mg by mouth daily.       Marland Kitchen PLAVIX 75 MG tablet Take one by mouth daily       . potassium chloride SA (K-DUR,KLOR-CON) 20 MEQ tablet Take 20 mEq by mouth daily.      . simvastatin (ZOCOR) 20 MG tablet Take one by mouth daily        No current facility-administered medications for this visit.    No Known Allergies  Review of Systems  Constitutional: Positive for activity change, fatigue and unexpected weight change. Negative for appetite change.        Weight gain  HENT: Positive for neck pain and neck stiffness. Negative for nosebleeds and facial swelling.        Wears dentures  Eyes: Negative.   Respiratory: Positive for cough. Negative for shortness of breath, wheezing and stridor.        Hemoptysis   Cardiovascular: Positive for chest pain and leg swelling.  Gastrointestinal: Positive for diarrhea.       Frequent heartburn, reflux into throat. dysphagia  Endocrine: Negative.   Genitourinary: Negative.   Musculoskeletal: Positive for back pain, joint swelling, arthralgias and gait problem.  Skin: Negative.   Allergic/Immunologic: Negative.   Neurological: Positive for dizziness, weakness and headaches.       Prior stroke 5 years ago with left hemiparesis. Uses a wheelchair most of the time.  Chronic back pain related to prior spine surgery.  Memory disturbance  Hematological: Bruises/bleeds easily.  Psychiatric/Behavioral: Positive for dysphoric mood.    BP 166/72  Pulse 55  Resp 16  Ht 5\' 1"  (1.549 m)  Wt 180 lb (81.647 kg)  BMI 34.03 kg/m2  SpO2 98% Physical Exam  Constitutional: She is oriented to person, place, and time.  Chronically ill-appearing white female who appears depressed.  HENT:  Head: Normocephalic and atraumatic.  Mouth/Throat: Oropharynx is clear and moist.  Eyes: Conjunctivae and EOM are normal.  Neck: No JVD present. No tracheal deviation present. Thyromegaly present.  Cardiovascular: Normal rate, regular rhythm, normal heart sounds and intact distal pulses.  Exam reveals no gallop and no friction rub.   No murmur heard. Pulmonary/Chest: Effort normal and breath sounds normal. No respiratory distress. She has no wheezes. She has no rales.  Abdominal: Soft. Bowel sounds are normal. She exhibits no distension and no mass. There is no tenderness.  Musculoskeletal: She exhibits edema.  Lymphadenopathy:    She has no cervical adenopathy.  Neurological: She is alert and oriented to person, place, and  time. No cranial nerve deficit or sensory deficit.  3/5 strength in left upper and lower extremity.  Skin: Skin is warm and dry.  Psychiatric: She exhibits a depressed mood.     Diagnostic Tests:   *RADIOLOGY REPORT*   Clinical Data: Right lower lobe pulmonary mass.  Hemoptysis.   CT CHEST WITH CONTRAST   Technique:  Multidetector CT imaging of the chest was performed following the standard protocol during bolus administration  of intravenous contrast.   Contrast: 70mL OMNIPAQUE IOHEXOL 300 MG/ML  SOLN   Comparison: Chest x-ray dated 11/25/2012   Findings: There is a 2.7 x 2.0 x 1.7 cm smoothly marginated mass in the right lower lobe.  This has increased substantially in size since the prior chest x-ray of 01/04/2012.   There is no appreciable hilar or mediastinal adenopathy.   However, there is a 2.1 cm mass in the posterior medial aspect of the dome of the right lobe of the liver and there is also an enhancing 13 mm mass in the anterior aspect of the left lobe of the liver.  In addition, there are multiple tiny lucent areas in the thoracic spine which could represent metastatic disease.   There are no other discrete pulmonary nodules.  There is pleural thickening along the posterior aspect of the right major fissure. There is a tiny area of peripheral atelectasis in the left lower lobe.   No effusions.   Cardiomegaly, mild.   Multiple nodules in the thyroid gland, the largest being approximately 1.4 cm in the left lobe.   IMPRESSION:   1.  Enlarging mass in the right lower lobe worrisome for carcinoma. 2.  Two lesions in the liver worrisome for metastatic disease. 3.  Multiple small areas of lucency in the thoracic spine worrisome for metastases.     Original Report Authenticated By: Lorriane Shire, M.D.     Impression:  She has an enlarging mass in the right lower lobe of the lung that was noted one year ago and ignored by the patient and is suspicious  for bronchogenic carcinoma. There are also 2 lesions in the liver concerning for metastatic disease and multiple small lucent areas in the thoracic spine that may be metastasis. She is 79 years old and generally debilitated related to her previous spine surgery and stroke. She will require a PET scan and MRI or CT of the brain for further workup as well as a CT guided needle biopsy of the right lower lobe lung mass for tissue diagnosis.  Plan: 1. PET scan. 2. MRI or CT of the brain. 3. CT guided needle biopsy of the right lower lobe lung mass. 4. Return to [facility] clinic afterwards to discuss the results. I think she most likely has an advanced stage lung cancer and will need to be seen by medical oncology and radiation oncology.

## 2012-12-08 ENCOUNTER — Ambulatory Visit
Admission: RE | Admit: 2012-12-08 | Discharge: 2012-12-08 | Disposition: A | Discharge status: Home / Self Care | Payer: Medicare Other | Source: Ambulatory Visit | Attending: Surgery | Admitting: Surgery

## 2012-12-08 ENCOUNTER — Encounter (HOSPITAL_COMMUNITY)
Admission: RE | Admit: 2012-12-08 | Discharge: 2012-12-08 | Disposition: A | Discharge status: Home / Self Care | Payer: Medicare Other | Source: Ambulatory Visit | Attending: Surgery | Admitting: Surgery

## 2012-12-08 DIAGNOSIS — R51 Headache: Secondary | ICD-10-CM

## 2012-12-08 DIAGNOSIS — R911 Solitary pulmonary nodule: Secondary | ICD-10-CM | POA: Insufficient documentation

## 2012-12-08 DIAGNOSIS — E041 Nontoxic single thyroid nodule: Secondary | ICD-10-CM | POA: Insufficient documentation

## 2012-12-08 DIAGNOSIS — K7689 Other specified diseases of liver: Secondary | ICD-10-CM | POA: Insufficient documentation

## 2012-12-08 DIAGNOSIS — D381 Neoplasm of uncertain behavior of trachea, bronchus and lung: Secondary | ICD-10-CM

## 2012-12-08 MED ORDER — GADOBENATE DIMEGLUMINE 529 MG/ML IV SOLN
8.0000 mL | Freq: Once | INTRAVENOUS | Status: AC | PRN
  Administered 2012-12-08: 8 mL via INTRAVENOUS

## 2012-12-08 MED ORDER — FLUDEOXYGLUCOSE F - 18 (FDG) INJECTION
18.8000 | Freq: Once | INTRAVENOUS | Status: AC | PRN
  Administered 2012-12-08: 18.8 via INTRAVENOUS

## 2012-12-15 ENCOUNTER — Other Ambulatory Visit: Payer: Self-pay

## 2012-12-15 DIAGNOSIS — D381 Neoplasm of uncertain behavior of trachea, bronchus and lung: Secondary | ICD-10-CM

## 2012-12-16 ENCOUNTER — Encounter (HOSPITAL_COMMUNITY): Payer: Self-pay | Admitting: Pharmacy Technician

## 2012-12-16 ENCOUNTER — Other Ambulatory Visit: Payer: Self-pay

## 2012-12-16 DIAGNOSIS — E041 Nontoxic single thyroid nodule: Secondary | ICD-10-CM

## 2012-12-17 ENCOUNTER — Other Ambulatory Visit: Payer: Self-pay | Admitting: Radiology

## 2012-12-23 ENCOUNTER — Encounter (HOSPITAL_COMMUNITY): Payer: Self-pay

## 2012-12-23 ENCOUNTER — Inpatient Hospital Stay (HOSPITAL_COMMUNITY): Payer: Medicare Other

## 2012-12-23 ENCOUNTER — Observation Stay (HOSPITAL_COMMUNITY)
Admission: RE | Admit: 2012-12-23 | Discharge: 2012-12-24 | DRG: 920 | Disposition: A | Discharge status: Home / Self Care | Payer: Medicare Other | Source: Ambulatory Visit | Attending: Emergency Medicine | Admitting: Emergency Medicine

## 2012-12-23 ENCOUNTER — Ambulatory Visit (HOSPITAL_COMMUNITY)
Admission: RE | Admit: 2012-12-23 | Discharge: 2012-12-23 | Disposition: A | Discharge status: Home / Self Care | Payer: Medicare Other | Source: Ambulatory Visit | Attending: Surgery | Admitting: Surgery

## 2012-12-23 VITALS — BP 121/54 | HR 63 | Temp 98.4°F | Resp 18 | Ht 61.0 in | Wt 180.0 lb

## 2012-12-23 DIAGNOSIS — I129 Hypertensive chronic kidney disease with stage 1 through stage 4 chronic kidney disease, or unspecified chronic kidney disease: Secondary | ICD-10-CM | POA: Insufficient documentation

## 2012-12-23 DIAGNOSIS — Y849 Medical procedure, unspecified as the cause of abnormal reaction of the patient, or of later complication, without mention of misadventure at the time of the procedure: Secondary | ICD-10-CM | POA: Insufficient documentation

## 2012-12-23 DIAGNOSIS — I5032 Chronic diastolic (congestive) heart failure: Secondary | ICD-10-CM | POA: Insufficient documentation

## 2012-12-23 DIAGNOSIS — I509 Heart failure, unspecified: Secondary | ICD-10-CM | POA: Insufficient documentation

## 2012-12-23 DIAGNOSIS — Z79899 Other long term (current) drug therapy: Secondary | ICD-10-CM | POA: Insufficient documentation

## 2012-12-23 DIAGNOSIS — Z8673 Personal history of transient ischemic attack (TIA), and cerebral infarction without residual deficits: Secondary | ICD-10-CM | POA: Insufficient documentation

## 2012-12-23 DIAGNOSIS — IMO0002 Reserved for concepts with insufficient information to code with codable children: Principal | ICD-10-CM | POA: Insufficient documentation

## 2012-12-23 DIAGNOSIS — N183 Chronic kidney disease, stage 3 unspecified: Secondary | ICD-10-CM | POA: Insufficient documentation

## 2012-12-23 DIAGNOSIS — D381 Neoplasm of uncertain behavior of trachea, bronchus and lung: Secondary | ICD-10-CM

## 2012-12-23 DIAGNOSIS — E119 Type 2 diabetes mellitus without complications: Secondary | ICD-10-CM | POA: Insufficient documentation

## 2012-12-23 DIAGNOSIS — I420 Dilated cardiomyopathy: Secondary | ICD-10-CM | POA: Diagnosis present

## 2012-12-23 DIAGNOSIS — R042 Hemoptysis: Secondary | ICD-10-CM | POA: Insufficient documentation

## 2012-12-23 DIAGNOSIS — E041 Nontoxic single thyroid nodule: Secondary | ICD-10-CM | POA: Insufficient documentation

## 2012-12-23 DIAGNOSIS — I272 Pulmonary hypertension, unspecified: Secondary | ICD-10-CM | POA: Diagnosis present

## 2012-12-23 DIAGNOSIS — R911 Solitary pulmonary nodule: Secondary | ICD-10-CM | POA: Insufficient documentation

## 2012-12-23 HISTORY — DX: Type 2 diabetes mellitus without complications: E11.9

## 2012-12-23 HISTORY — DX: Pneumonia, unspecified organism: J18.9

## 2012-12-23 HISTORY — DX: Personal history of other medical treatment: Z92.89

## 2012-12-23 LAB — CBC
HCT: 32.5 % — ABNORMAL LOW (ref 36.0–46.0)
Hemoglobin: 11.1 g/dL — ABNORMAL LOW (ref 12.0–15.0)
Hemoglobin: 10.7 g/dL — ABNORMAL LOW (ref 12.0–15.0)
MCH: 29.4 pg (ref 26.0–34.0)
MCH: 28.8 pg (ref 26.0–34.0)
MCHC: 34.2 g/dL (ref 30.0–36.0)
MCHC: 33.6 g/dL (ref 30.0–36.0)
MCV: 86 fL (ref 78.0–100.0)
MCV: 85.5 fL (ref 78.0–100.0)
Platelets: 248 10*3/uL (ref 150–400)
RBC: 3.72 MIL/uL — ABNORMAL LOW (ref 3.87–5.11)

## 2012-12-23 LAB — PROTIME-INR
INR: 0.96 (ref 0.00–1.49)
Prothrombin Time: 13.2 seconds (ref 11.6–15.2)

## 2012-12-23 LAB — BASIC METABOLIC PANEL
BUN: 17 mg/dL (ref 6–23)
Chloride: 100 mEq/L (ref 96–112)
GFR calc Af Amer: 45 mL/min — ABNORMAL LOW (ref >90)
Potassium: 3.9 mEq/L (ref 3.5–5.1)

## 2012-12-23 LAB — APTT: aPTT: 32 seconds (ref 24–37)

## 2012-12-23 MED ORDER — CHLORHEXIDINE GLUCONATE CLOTH 2 % EX PADS
6.0000 | MEDICATED_PAD | Freq: Every day | CUTANEOUS | Status: DC
  Administered 2012-12-23 – 2012-12-24 (×2): 6 via TOPICAL

## 2012-12-23 MED ORDER — MUPIROCIN 2 % EX OINT
1.0000 "application " | TOPICAL_OINTMENT | Freq: Two times a day (BID) | CUTANEOUS | Status: DC
  Administered 2012-12-23 – 2012-12-24 (×2): 1 via NASAL
  Filled 2012-12-23: qty 22

## 2012-12-23 MED ORDER — FUROSEMIDE 40 MG PO TABS
40.0000 mg | ORAL_TABLET | Freq: Two times a day (BID) | ORAL | Status: DC
  Administered 2012-12-23 – 2012-12-24 (×2): 40 mg via ORAL
  Filled 2012-12-23 (×4): qty 1

## 2012-12-23 MED ORDER — CARVEDILOL 3.125 MG PO TABS
3.1250 mg | ORAL_TABLET | Freq: Two times a day (BID) | ORAL | Status: DC
  Administered 2012-12-23 – 2012-12-24 (×2): 3.125 mg via ORAL
  Filled 2012-12-23 (×4): qty 1

## 2012-12-23 MED ORDER — GLIPIZIDE 10 MG PO TABS
10.0000 mg | ORAL_TABLET | Freq: Every day | ORAL | Status: DC
  Administered 2012-12-24: 10 mg via ORAL
  Filled 2012-12-23 (×2): qty 1

## 2012-12-23 MED ORDER — FENTANYL CITRATE 0.05 MG/ML IJ SOLN
INTRAMUSCULAR | Status: AC
  Filled 2012-12-23: qty 4

## 2012-12-23 MED ORDER — FENTANYL CITRATE 0.05 MG/ML IJ SOLN
INTRAMUSCULAR | Status: AC | PRN
  Administered 2012-12-23 (×2): 25 ug via INTRAVENOUS

## 2012-12-23 MED ORDER — HYDROCODONE-ACETAMINOPHEN 5-325 MG PO TABS
1.0000 | ORAL_TABLET | Freq: Four times a day (QID) | ORAL | Status: DC | PRN
  Administered 2012-12-23 – 2012-12-24 (×2): 1 via ORAL
  Filled 2012-12-23 (×2): qty 1

## 2012-12-23 MED ORDER — FLUOXETINE HCL 20 MG PO CAPS
20.0000 mg | ORAL_CAPSULE | Freq: Three times a day (TID) | ORAL | Status: DC
  Administered 2012-12-23 – 2012-12-24 (×3): 20 mg via ORAL
  Filled 2012-12-23 (×5): qty 1

## 2012-12-23 MED ORDER — CLONAZEPAM 0.5 MG PO TABS
0.5000 mg | ORAL_TABLET | Freq: Three times a day (TID) | ORAL | Status: DC
  Administered 2012-12-23 – 2012-12-24 (×3): 0.5 mg via ORAL
  Filled 2012-12-23 (×3): qty 1

## 2012-12-23 MED ORDER — MIDAZOLAM HCL 2 MG/2ML IJ SOLN
INTRAMUSCULAR | Status: AC
  Filled 2012-12-23: qty 4

## 2012-12-23 MED ORDER — SODIUM CHLORIDE 0.9 % IV SOLN
Freq: Once | INTRAVENOUS | Status: AC
  Administered 2012-12-23: 09:00:00 via INTRAVENOUS

## 2012-12-23 MED ORDER — MIDAZOLAM HCL 2 MG/2ML IJ SOLN
INTRAMUSCULAR | Status: AC | PRN
  Administered 2012-12-23 (×2): 0.5 mg via INTRAVENOUS

## 2012-12-23 NOTE — Procedures (Signed)
CT lung core bx 18g x3 Alveolar hemorrhage and self limited hemoptysis post procedure. No ptx.     See complete dictation in [facility]

## 2012-12-23 NOTE — ED Notes (Signed)
Thyroid biospy complete

## 2012-12-23 NOTE — H&P (Signed)
Chief Complaint: "I'm here for biopsies" Referring Physician:Bartle HPI: Norma [name] is an 79 y.o. female who is being worked up for a new lung mass. She is also found to have a thyroid nodule. She is referred to IR for biopsy of both the thyroid lesion and the right lung mass. She has held her Plavix for the past 6 days. PMHx and meds reviewed.  Past Medical History:  Past Medical History  Diagnosis Date  . Unspecified pleural effusion   . Acute systolic heart failure   . Other primary cardiomyopathies   . Congestive heart failure, unspecified   . Other specified forms of chronic ischemic heart disease   . Coronary atherosclerosis of native coronary artery   . Unspecified hypertensive kidney disease with chronic kidney disease stage I through stage IV, or unspecified(403.90)   . Type II or unspecified type diabetes mellitus with neurological manifestations, uncontrolled(250.62)   . Chronic kidney disease, unspecified   . Anemia of other chronic disease   . Other chronic pulmonary heart diseases   . Hypoxemia   . Chronic pain syndrome   . Stroke     Past Surgical History:  Past Surgical History  Procedure Laterality Date  . Cholecystectomy    . Appendectomy    . Abdominal hysterectomy    . Bilateral knee surgery    . Cataract extraction, bilateral    . Tonsillectomy      Family History:  Family History  Problem Relation Age of Onset  . Heart attack Father 43    MI  . Cancer Father     leukemia  . Cancer Brother   . Heart disease Daughter     Social History:  reports that she has never smoked. She has never used smokeless tobacco. She reports that she does not drink alcohol or use illicit drugs.  Allergies: No Known Allergies  Medications: carvedilol (COREG) 3.125 MG tablet (Taking) 60 tablet 6 03/08/2012 Sig - Route: Take 1 tablet (3.125 mg total) by mouth 2 (two) times daily. - Oral Number of times this order has been changed since signing: 2 Order [address]  clonazePAM (KLONOPIN) 0.5 MG tablet (Taking) Sig - Route: Take 0.5 mg by mouth 3 (three) times daily. - Oral Class: Historical Med Number of times this order has been changed since signing: 4 Order [address] clopidogrel (PLAVIX) 75 MG tablet (Taking) Sig - Route: Take 75 mg by mouth daily. - Oral Class: Historical Med Number of times this order has been changed since signing: 1 Order [address] ferrous sulfate 325 (65 FE) MG tablet (Taking) Sig - Route: Take 325 mg by mouth daily with breakfast. - Oral Class: Historical Med Number of times this order has been changed since signing: 2 Order [address] FLUoxetine (PROZAC) 20 MG capsule (Taking) 10/21/2010 Sig - Route: Take 20 mg by mouth 3 (three) times daily. - Oral Class: Historical Med Number of times this order has been changed since signing: 8 Order [address] furosemide (LASIX) 40 MG tablet (Taking) 12/04/2010 Sig - Route: Take 40 mg by mouth 2 (two) times daily. - Oral Class: Historical Med Number of times this order has been changed since signing: 4 Order [address] glipiZIDE (GLUCOTROL) 5 MG tablet (Taking) Sig - Route: Take 10 mg by mouth daily. - Oral Class: Historical Med Number of times this order has been changed since signing: 8 Order [address] HYDROcodone-acetaminophen (NORCO/VICODIN) 5-325 MG per tablet (Taking) Sig - Route: Take 1 tablet by mouth every  6 (six) hours as needed for pain. - Oral Class: Historical Med Number of times this order has been changed since signing: 2 Order [address] insulin detemir (LEVEMIR) 100 UNIT/ML injection (Taking) Sig - Route: Inject 14-25 Units into the skin at bedtime. Based on sugar levels - Subcutaneous Class: Historical Med Number of times this order has been changed since signing: 4 Order [address] ondansetron (ZOFRAN) 8 MG tablet (Taking) Sig - Route: Take 8 mg by mouth every 8 (eight) hours as needed for nausea. - Oral Class: Historical Med Number of times this order has been changed since signing: 2  Order [address] oxybutynin (DITROPAN) 5 MG tablet (Taking) 11/18/2010 Sig - Route: Take 5 mg by mouth 2 (two) times daily. - Oral Class: Historical Med Number of times this order has been changed since signing: 8 Order [address] simvastatin (ZOCOR) 20 MG tablet (Taking)   Please HPI for pertinent positives, otherwise complete 10 system ROS negative.  Physical Exam: Blood pressure 160/76, pulse 68, temperature 98.5 F (36.9 C), temperature source Oral, resp. rate 18, height 5\' 1"  (1.549 m), weight 180 lb (81.647 kg), SpO2 98.00%. Body mass index is 34.03 kg/(m^2).   General Appearance:  Alert, cooperative, no distress, appears stated age  Head:  Normocephalic, without obvious abnormality, atraumatic  ENT: Unremarkable  Neck: Supple, symmetrical, trachea midline, no adenopathy, thyroid: not enlarged,   Lungs:   Clear to auscultation bilaterally, no w/r/r, respirations unlabored without use of accessory muscles.  Chest Wall:  No tenderness or deformity  Heart:  Regular rate and rhythm, S1, S2 normal, no murmur, rub or gallop.   Neurologic: Normal affect, no gross deficits.   Results for orders placed during the hospital encounter of 12/23/12 (from the past 48 hour(s))  APTT     Status: None   Collection Time    12/23/12  8:40 AM      Result Value Range   aPTT 32  24 - 37 seconds  CBC     Status: Abnormal   Collection Time    12/23/12  8:40 AM      Result Value Range   WBC 10.0  4.0 - 10.5 K/uL   RBC 3.78 (*) 3.87 - 5.11 MIL/uL   Hemoglobin 11.1 (*) 12.0 - 15.0 g/dL   HCT 32.5 (*) 36.0 - 46.0 %   MCV 86.0  78.0 - 100.0 fL   MCH 29.4  26.0 - 34.0 pg   MCHC 34.2  30.0 - 36.0 g/dL   RDW 12.6  11.5 - 15.5 %   Platelets 230  150 - 400 K/uL  PROTIME-INR     Status: None   Collection Time    12/23/12  8:40 AM      Result Value Range   Prothrombin Time 12.7  11.6 - 15.2 seconds   INR 0.96  0.00 - 1.49  GLUCOSE, CAPILLARY     Status: Abnormal   Collection Time    12/23/12  8:48 AM       Result Value Range   Glucose-Capillary 121 (*) 70 - 99 mg/dL   No results found.  Assessment/Plan Rt lung mass Thyroid nodule Discussed both US guided thyroid FNA as well as CT guided lung mass biopsy. Explained procedures, risks, complications including bleeding, PTX, need for chest tube/admission. Labs reviewed. Consent for both procedures signed in chart  Ascencion Dike PA-C 12/23/2012, 10:21 AM

## 2012-12-23 NOTE — Procedures (Signed)
[place] FNA dominant R mid thyroid nodule 25g x4 to cyto No complication No blood loss. See complete dictation in [facility].

## 2012-12-23 NOTE — ED Notes (Addendum)
[place] replaced with face mask. Pt tolerating well.  Aprox total of 75 cc bright red blood collected in suction canister.  Critical care MD on site

## 2012-12-23 NOTE — ED Notes (Signed)
Pt having large amt of  bright red bleeding without noted clots expelled via mouth.  Pt a & o x 3.  Rapid response paged per policy. Anesthesia called per MD orders

## 2012-12-23 NOTE — ED Notes (Signed)
Starting with US guided thyroid bx to CT guided lung bx

## 2012-12-23 NOTE — H&P (Signed)
PULMONARY  / CRITICAL CARE MEDICINE  Name: Norma [name] MRN: [id number] DOB: September 08, 1934    ADMISSION DATE:  12/23/2012 CONSULTATION DATE:  12/23/12  REFERRING MD :  Dr. Cyndia Bent PRIMARY SERVICE: PCCM  CHIEF COMPLAINT:  Hemoptysis   BRIEF PATIENT DESCRIPTION: Norma [name] is 79 yo F pmh of diastolic HF (last echo 123456 EF 55-60%, hypokinetic inferolateral/inferiror walls, MR, diastolic filling defects Grade 2) on plavix (held since 4/4), HTN, CKD, and previous CVA presented for lung and thyroid nodule biopsy. After procedure pt developed significant hemoptysis (~75cc) and CCM asked to evaluate and admit.   SIGNIFICANT EVENTS / STUDIES:  4/10- hemoptysis s/p procedure  LINES / TUBES: 4/10 PIV  CULTURES:   ANTIBIOTICS:   HISTORY OF PRESENT ILLNESS:  Pt post IR procedure concern for alveolar hemorrhage and hemoptysis. At presentation ~75cc hemopytsis and HTN SBP 190s.   PAST MEDICAL HISTORY :  Past Medical History  Diagnosis Date  . Unspecified pleural effusion   . Acute systolic heart failure   . Other primary cardiomyopathies   . Congestive heart failure, unspecified   . Other specified forms of chronic ischemic heart disease   . Coronary atherosclerosis of native coronary artery   . Unspecified hypertensive kidney disease with chronic kidney disease stage I through stage IV, or unspecified(403.90)   . Chronic kidney disease, unspecified   . Anemia of other chronic disease   . Other chronic pulmonary heart diseases   . Hypoxemia   . Chronic pain syndrome   . Pneumonia     "when I was little" (12/23/2012)  . Type II diabetes mellitus   . History of blood transfusion 1960's    "when my last child was born" (12/23/2012)  . Stroke ~ 2011    "slowed my walking" (12/23/2012)   Past Surgical History  Procedure Laterality Date  . Cholecystectomy    . Appendectomy    . Abdominal hysterectomy    . Patella reconstruction Bilateral     "had my knee caps replaced"  (12/23/2012)  . Cataract extraction, bilateral    . Tonsillectomy    . Dilation and curettage of uterus     Prior to Admission medications   Medication Sig Start Date End Date Taking? Authorizing Provider  carvedilol (COREG) 3.125 MG tablet Take 1 tablet (3.125 mg total) by mouth 2 (two) times daily. 03/08/12  Yes Aurora Mask, PA-C  clonazePAM (KLONOPIN) 0.5 MG tablet Take 0.5 mg by mouth 3 (three) times daily.    Yes Historical Provider, MD  clopidogrel (PLAVIX) 75 MG tablet Take 75 mg by mouth daily.   Yes Historical Provider, MD  ferrous sulfate 325 (65 FE) MG tablet Take 325 mg by mouth daily with breakfast.   Yes Historical Provider, MD  FLUoxetine (PROZAC) 20 MG capsule Take 20 mg by mouth 3 (three) times daily.  10/21/10  Yes Historical Provider, MD  furosemide (LASIX) 40 MG tablet Take 40 mg by mouth 2 (two) times daily.  12/04/10  Yes Ezra Sites, MD  glipiZIDE (GLUCOTROL) 5 MG tablet Take 10 mg by mouth daily.    Yes Historical Provider, MD  HYDROcodone-acetaminophen (NORCO/VICODIN) 5-325 MG per tablet Take 1 tablet by mouth every 6 (six) hours as needed for pain.   Yes Historical Provider, MD  insulin detemir (LEVEMIR) 100 UNIT/ML injection Inject 14-25 Units into the skin at bedtime. Based on sugar levels   Yes Historical Provider, MD  ondansetron (ZOFRAN) 8 MG tablet Take 8 mg by mouth every  8 (eight) hours as needed for nausea.    Yes Historical Provider, MD  oxybutynin (DITROPAN) 5 MG tablet Take 5 mg by mouth 2 (two) times daily.  11/18/10  Yes Historical Provider, MD  simvastatin (ZOCOR) 20 MG tablet Take 20 mg by mouth every evening.    Yes Historical Provider, MD   No Known Allergies  FAMILY HISTORY:  Family History  Problem Relation Age of Onset  . Heart attack Father 56    MI  . Cancer Father     leukemia  . Cancer Brother   . Heart disease Daughter    SOCIAL HISTORY:  reports that she has never smoked. She has never used smokeless tobacco. She reports that she does  not drink alcohol or use illicit drugs.  REVIEW OF SYSTEMS:   Review of Systems - Negative except hemoptysis, denied CP, SOB, HA, fever/chills History obtained from the patient   SUBJECTIVE:   VITAL SIGNS: Temp:  [98.5 F (36.9 C)] 98.5 F (36.9 C) (04/10 1529) Pulse Rate:  [62-97] 67 (04/10 1545) Resp:  [14-35] 21 (04/10 1545) BP: (132-190)/(46-93) 167/71 mmHg (04/10 1545) SpO2:  [84 %-100 %] 98 % (04/10 1545) Weight:  [180 lb (81.647 kg)] 180 lb (81.647 kg) (04/10 0845) HEMODYNAMICS:   VENTILATOR SETTINGS:   INTAKE / OUTPUT: Intake/Output   None     PHYSICAL EXAMINATION: General:  Anxious, alert and oriented x3 Neuro:  Interacting appropriately HEENT:  O2 mask in place, fresh blood w/o clot in oropharynx, no bloody PND, over sternal notch fresh incision with minimal bleeding Cardiovascular:  Slight systolic murmur, RRR Lungs:  CTAB Abdomen:  Soft, nt, nd, BS+ Musculoskeletal:  intact Skin:  Small incision over sternal notch  LABS:  Recent Labs Lab 12/23/12 0840  HGB 11.1*  WBC 10.0  PLT 230  APTT 32  INR 0.96    Recent Labs Lab 12/23/12 0848 12/23/12 1528  GLUCAP 121* 191*    4/10 CXR: no ptx, slight increase in right basilar atelectasis  ASSESSMENT / PLAN:  PULMONARY A: S/p lung/thyroid bx on 4/10 developed hemoptysis ~75cc, Saturating well on 6L O2 P:   Wean O2 needs for sats>93% Repeat pcxr 3hrs post procedure coags  CARDIOVASCULAR A: initially HTN SBPs 190s P:  Restart coreg, lasix  RENAL A:  stable P:   bmet monitor  GASTROINTESTINAL A:  No acute issues P:   NPO until status improves then advance diet   HEMATOLOGIC A:  Developed hemoptysis P:  CBC monitor  INFECTIOUS A:  No active issues P:   monitor   NEUROLOGIC A:  mentating well  P:   Cont to monitor   TODAY'S SUMMARY:  -developed hemoptysis post IR bx of lung nodule -monitor CBC -monitor respiratory status  Clinton Gallant, MD PGY-1 Pgr:  [phone] 12/23/2012, 4:00 PM   Baltazar Apo, MD, PhD 12/24/2012, 10:15 AM [facility] [phone] or if no answer [phone]

## 2012-12-23 NOTE — ED Notes (Signed)
Dr Lamonte Sakai paged with room assignment

## 2012-12-23 NOTE — ED Notes (Signed)
SAO2 down to 80%.  O2 via [place] increased to 6L.

## 2012-12-23 NOTE — ED Notes (Signed)
Coughing up frothy bloody sputum

## 2012-12-23 NOTE — ED Notes (Signed)
Repositioned pt to R side

## 2012-12-24 DIAGNOSIS — R042 Hemoptysis: Secondary | ICD-10-CM | POA: Diagnosis not present

## 2012-12-24 NOTE — Progress Notes (Signed)
Inpatient Diabetes Program Recommendations  AACE/ADA: New Consensus Statement on Inpatient Glycemic Control  Target Ranges:  Prepandial:   less than 140 mg/dL      Peak postprandial:   less than 180 mg/dL (1-2 hours)      Critically ill patients:  140 - 180 mg/dL  Pager:  [phone] Hours:  8 am-10pm   Reason for Visit:Elevated glucose: 219 mg/dL and now taking only Glucotrol in hospital  Inpatient Diabetes Program Recommendations Insulin - Basal: Add home Levemir: Consider Levemir 15 units qhs Correction (SSI): Add Novolog Correction HgbA1C: Check HgbA1C  Courtney Heys PhD, RN, BC-ADM Diabetes Coordinator  Office:  [phone] Team Pager:  [phone]

## 2012-12-24 NOTE — Discharge Summary (Signed)
[facility] Discharge Note  Name: Norma [name] MRN: [id number] DOB: 12-08-1933 79 y.o.  Date of Admission: 12/23/2012  2:01 PM Date of Discharge: 12/24/2012 Attending Physician: Collene Gobble, MD  Final Discharge Diagnosis Hemoptysis  Discharge Diagnosis: Hemoptysis  Diastolic CHF CKD stage III Pulmonary nodule  Discharge Medications:   Medication List    TAKE these medications       carvedilol 3.125 MG tablet  Commonly known as:  COREG  Take 1 tablet (3.125 mg total) by mouth 2 (two) times daily.     clonazePAM 0.5 MG tablet  Commonly known as:  KLONOPIN  Take 0.5 mg by mouth 3 (three) times daily.     clopidogrel 75 MG tablet  Commonly known as:  PLAVIX  Take 75 mg by mouth daily.     ferrous sulfate 325 (65 FE) MG tablet  Take 325 mg by mouth daily with breakfast.     FLUoxetine 20 MG capsule  Commonly known as:  PROZAC  Take 20 mg by mouth 3 (three) times daily.     furosemide 40 MG tablet  Commonly known as:  LASIX  Take 40 mg by mouth 2 (two) times daily.     glipiZIDE 5 MG tablet  Commonly known as:  GLUCOTROL  Take 10 mg by mouth daily.     HYDROcodone-acetaminophen 5-325 MG per tablet  Commonly known as:  NORCO/VICODIN  Take 1 tablet by mouth every 6 (six) hours as needed for pain.     insulin detemir 100 UNIT/ML injection  Commonly known as:  LEVEMIR  Inject 14-25 Units into the skin at bedtime. Based on sugar levels     ondansetron 8 MG tablet  Commonly known as:  ZOFRAN  Take 8 mg by mouth every 8 (eight) hours as needed for nausea.     oxybutynin 5 MG tablet  Commonly known as:  DITROPAN  Take 5 mg by mouth 2 (two) times daily.     simvastatin 20 MG tablet  Commonly known as:  ZOCOR  Take 20 mg by mouth every evening.        Disposition and follow-up:   Ms.Norma [name] was discharged from [facility] in Stable condition.  At the hospital follow up visit please address  anticoagulation and respiratory status.   Follow-up Appointments: PCP in 1 wk  Discharge Orders   Future Orders Complete By Expires     Discharge instructions  As directed     Comments:      Please make a follow up appointment with your primary care doctor to see how you are feeling after your procedure. Start back your blood thinner medication on Monday.    Increase activity slowly  As directed      Ct Biopsy  12/23/2012  *RADIOLOGY REPORT*  Clinical data:  Enlarging right lower lobe nodule with low-level activity on PET-CT.  CT-GUIDED RIGHT LUNG NODULE CORE BIOPSY  Comparison:  PET CT 12/08/2012  Technique findings: The procedure, risks (including but not limited to bleeding, infection, organ damage, pneumothorax, chest tube), benefits, and alternatives were explained to the patient. Questions regarding the procedure were encouraged and answered. The patient understands and consents to the procedure.The patient placed and right lateral decubitus position and limited axial scans through the lower chest obtained.  The lesion was localized and an appropriate skin entry site determined.  Intravenous Fentanyl and Versed were administered as conscious sedation during continuous cardiorespiratory monitoring by the radiology RN,  with a total moderate sedation time of 55 minutes.  Operator donned sterile gloves and mask.   Site was marked, prepped with Betadine, draped in usual sterile fashion, infiltrated locally with 1% lidocaine. Under CT fluoroscopic guidance, a 17 gauge trocar needle was advanced to the margin of the lesion.  Once needle tip position was confirmed, coaxial 18-gauge core biopsy samples were obtained, submitted in formalin to  surgical pathology.  The guide needle was removed.  The patient experienced self-limited hemoptysis immediately post procedure.  Follow up scans show moderate regional alveolar hemorrhage.  No pneumothorax or hemothorax.  The patient was assessed by the critical care  service and admitted overnight for observation.  IMPRESSION: 1. Technically successful CT-guided core biopsy of right lower lobe lung nodule. 2.  Regional alveolar hemorrhage and self-limited hemoptysis postprocedure.  The patient will be admitted overnight for observation. I discussed the results over the telephone with Dr. Cyndia Bent at the time of the procedure.   Original Report Authenticated By: D. Wallace Going, MD    Dg Chest Port 1 View  12/23/2012  *RADIOLOGY REPORT*  Clinical Data: Evaluate for pneumothorax post right lung biopsy.  PORTABLE CHEST - 1 VIEW  Comparison: CT guided right lower lobe pulmonary nodule biopsy - 12/23/2012; chest radiograph - 11/25/2012  Findings:  Grossly unchanged borderline enlarged cardiac silhouette and mediastinal contours with mild tortuosity of the thoracic aorta. Minimal increase in right basilar heterogeneous opacities.  No definite pneumothorax.  Minimal left basilar opacities favored to represent atelectasis.  Unchanged bones.  IMPRESSION:  1.  No pneumothorax. 2.  Minimal increase in right basilar heterogeneous opacities favored to represent a minimal amount of hemorrhage following CT guided biopsy.   Original Report Authenticated By: Jake Seats, MD     Admission HPI: [name] is 79 yo F pmh of diastolic HF (last echo 123456 EF 55-60%, hypokinetic inferolateral/inferiror walls, MR, diastolic filling defects Grade 2) on plavix (held since 4/4), HTN, CKD, and previous CVA presented for lung and thyroid nodule biopsy. After procedure pt developed significant hemoptysis (~75cc) and CCM asked to evaluate and admit.   Hospital Course by problem list: 1. Hemoptysis: presented for IR bx of lung and thyroid nodule then post procedure developed ~75cc hemoptysis. Pt was not in respiratory distress and able to maintain airway. Successfully weaned from 6L oxygen mask to RA with good saturations. Pt never required intubation. Hgb remained stable. Repeat CXR didn't show  pneumothorax or significant infiltrations. Pt was not restarted on Plavix during this admission.   2. HTN: pt with baseline HTN and had not taken medications morning of procedure. Anxiety during hemoptysis SBP 190s but this immediately resolved and after patient was stable and able to tolerate fluids. Pt was restarted on coreg, and lasix.   Discharge Vitals:  BP 121/54  Pulse 63  Temp(Src) 98.4 F (36.9 C) (Oral)  Resp 18  Ht 5\' 1"  (1.549 m)  Wt 180 lb (81.647 kg)  BMI 34.03 kg/m2  SpO2 91% General: alert and oriented x3  Neuro: Interacting appropriately  HEENT: O2 mask in place, fresh blood w/o clot in oropharynx, no bloody PND, over sternal notch fresh incision with minimal bleeding  Cardiovascular: Slight systolic murmur, RRR  Lungs: CTAB  Abdomen: Soft, nt, nd, BS+  Musculoskeletal: intact  Skin: Small incision over sternal notch  Discharge Labs:  Results for orders placed during the hospital encounter of 12/23/12 (from the past 24 hour(s))  MRSA PCR SCREENING     Status: Abnormal  Collection Time    12/23/12  2:15 PM      Result Value Range   MRSA by PCR POSITIVE (*) NEGATIVE  GLUCOSE, CAPILLARY     Status: Abnormal   Collection Time    12/23/12  3:28 PM      Result Value Range   Glucose-Capillary 191 (*) 70 - 99 mg/dL  CBC     Status: Abnormal   Collection Time    12/23/12  4:22 PM      Result Value Range   WBC 12.7 (*) 4.0 - 10.5 K/uL   RBC 3.72 (*) 3.87 - 5.11 MIL/uL   Hemoglobin 10.7 (*) 12.0 - 15.0 g/dL   HCT 31.8 (*) 36.0 - 46.0 %   MCV 85.5  78.0 - 100.0 fL   MCH 28.8  26.0 - 34.0 pg   MCHC 33.6  30.0 - 36.0 g/dL   RDW 12.7  11.5 - 15.5 %   Platelets 248  150 - 400 K/uL  PROTIME-INR     Status: None   Collection Time    12/23/12  4:22 PM      Result Value Range   Prothrombin Time 13.2  11.6 - 15.2 seconds   INR 1.01  0.00 - 99991111  BASIC METABOLIC PANEL     Status: Abnormal   Collection Time    12/23/12  4:22 PM      Result Value Range   Sodium 137   135 - 145 mEq/L   Potassium 3.9  3.5 - 5.1 mEq/L   Chloride 100  96 - 112 mEq/L   CO2 28  19 - 32 mEq/L   Glucose, Bld 219 (*) 70 - 99 mg/dL   BUN 17  6 - 23 mg/dL   Creatinine, Ser 1.29 (*) 0.50 - 1.10 mg/dL   Calcium 9.2  8.4 - 10.5 mg/dL   GFR calc non Af Amer 39 (*) >90 mL/min   GFR calc Af Amer 45 (*) >90 mL/min    Signed: Clinton Gallant, MD 12/24/2012, 9:44 AM   Time Spent on Discharge: 30 min Services Ordered on Discharge: none Equipment Ordered on Discharge: none

## 2012-12-24 NOTE — Progress Notes (Signed)
DC instructions given to pt at this time re: f/u appts, diet, activity, wound care, meds to continue, s/s of problems, community resources, and MyChart.  Pt verbalized understanding of all instructions.  No s/s of any acute distress noted at dc.  IV removed.  Telemetry removed.

## 2012-12-24 NOTE — Progress Notes (Signed)
PULMONARY  / CRITICAL CARE MEDICINE  Name: Norma [name] MRN: [id number] DOB: [birth year]    ADMISSION DATE:  12/23/2012 CONSULTATION DATE:  12/23/12  REFERRING MD :  Dr. Cyndia Bent PRIMARY SERVICE: PCCM  CHIEF COMPLAINT:  Hemoptysis   BRIEF PATIENT DESCRIPTION: [name] is 79 yo F pmh of diastolic HF (last echo 123456 EF 55-60%, hypokinetic inferolateral/inferiror walls, MR, diastolic filling defects Grade 2) on plavix (held since 4/4), HTN, CKD, and previous CVA presented for lung and thyroid nodule biopsy. After procedure pt developed significant hemoptysis (~75cc) and CCM asked to evaluate and admit.   SIGNIFICANT EVENTS / STUDIES:  4/10- hemoptysis s/p procedure  LINES / TUBES: 4/10 PIV  CULTURES:  ANTIBIOTICS:  Imaging:  4/10 pcxr: no pneumothorax, slight basilar infiltrate on RLL  SUBJECTIVE: No acute events overnight, no repeat hemoptysis. Ambulating and weaned off O2.   VITAL SIGNS: Temp:  [97.8 F (36.6 C)-98.5 F (36.9 C)] 98.3 F (36.8 C) (04/11 0413) Pulse Rate:  [60-97] 63 (04/11 0700) Resp:  [14-35] 18 (04/11 0700) BP: (121-190)/(46-108) 121/54 mmHg (04/11 0700) SpO2:  [84 %-100 %] 91 % (04/11 0700) Weight:  [180 lb (81.647 kg)] 180 lb (81.647 kg) (04/10 0845) HEMODYNAMICS:   VENTILATOR SETTINGS:   INTAKE / OUTPUT: Intake/Output     04/10 0701 - 04/11 0700 04/11 0701 - 04/12 0700   P.O. 50    Total Intake(mL/kg) 50 (0.6)    Urine (mL/kg/hr) 70    Total Output 70     Net -20            PHYSICAL EXAMINATION: General: alert and oriented x3 Neuro:  Interacting appropriately HEENT:  O2 mask in place, fresh blood w/o clot in oropharynx, no bloody PND, over sternal notch fresh incision with minimal bleeding Cardiovascular:  Slight systolic murmur, RRR Lungs:  CTAB Abdomen:  Soft, nt, nd, BS+ Musculoskeletal:  intact Skin:  Small incision over sternal notch  LABS:  Recent Labs Lab 12/23/12 0840 12/23/12 1622  HGB 11.1* 10.7*  WBC 10.0  12.7*  PLT 230 248  NA  --  137  K  --  3.9  CL  --  100  CO2  --  28  GLUCOSE  --  219*  BUN  --  17  CREATININE  --  1.29*  CALCIUM  --  9.2  APTT 32  --   INR 0.96 1.01    Recent Labs Lab 12/23/12 0848 12/23/12 1528  GLUCAP 121* 191*    4/10 CXR: no ptx, slight increase in right basilar atelectasis  ASSESSMENT / PLAN:  PULMONARY A: S/p lung/thyroid bx on 4/10 developed hemoptysis ~75cc, Saturating well on RA, repeat pcxr no pneumothroax P:   Wean O2 needs for sats>93%  CARDIOVASCULAR A: initially HTN resolved P:  Cont coreg, lasix  RENAL A:  stable P:   monitor  GASTROINTESTINAL A:  No acute issues P:   Regular diet  HEMATOLOGIC A:  Developed hemoptysis post procedure. Hgb stable.  P:  monitor  INFECTIOUS A:  No active issues P:   monitor   NEUROLOGIC A:  mentating well  P:   Cont to monitor   TODAY'S SUMMARY:  -developed hemoptysis post IR bx of lung nodule -monitor CBC -monitor respiratory status  Clinton Gallant, MD PGY-1 Pgr: [phone] 12/24/2012, 7:22 AM

## 2013-01-11 ENCOUNTER — Encounter: Payer: Self-pay | Admitting: Surgery

## 2013-01-11 ENCOUNTER — Ambulatory Visit (INDEPENDENT_AMBULATORY_CARE_PROVIDER_SITE_OTHER): Payer: Medicare Other | Admitting: Surgery

## 2013-01-11 VITALS — BP 157/79 | HR 57 | Resp 18 | Ht 61.0 in | Wt 180.0 lb

## 2013-01-11 DIAGNOSIS — Z9889 Other specified postprocedural states: Secondary | ICD-10-CM

## 2013-01-11 DIAGNOSIS — R222 Localized swelling, mass and lump, trunk: Secondary | ICD-10-CM

## 2013-01-11 DIAGNOSIS — R918 Other nonspecific abnormal finding of lung field: Secondary | ICD-10-CM

## 2013-01-11 NOTE — Progress Notes (Signed)
[address]       [place], [id number]             [phone]      HPI:  The patient returns to the office today to discuss the results of her CT guided lung biopsy on 12/23/2012. She is a 79 year old woman with minimal smoking history in the distant past who is significantly disabled from prior spine surgery and a stroke about 5 years ago with left hemiparesis. She reports developing hemoptysis in early March which lasted for several days and stopped. She presented to her primary physician and a chest x-ray showed a right lower lobe lung mass. CT scan of the chest shows a 2.7 x 2.0 x 1.7 cm mass in the right lower lobe of the lung. This had been noted on chest x-ray dated 01/04/2012 but according to the patient's family she ignored it. The  CT scan showed a 2.1 cm mass in the posterior medial aspect of the dome of the right lobe of the liver and also an enhancing 13 mm mass in the anterior aspect of the left lobe of liver. In addition there were multiple small lucent areas in the thoracic spine suspicious for metastatic disease. There was also nodular thyroid disease. Review of a previous abdominal CT done in August of 2012 showed that the right lower lobe lung nodule was present at that time but somewhat smaller. After review of these studies it was felt that she probably had a right lower lobe lung cancer with skeletal and liver metastasis. She underwent a PET scan that showed low level metabolic activity within the right lower lobe lung mass with an SUV max of 2.1-2.4. The liver lesions were not hypermetabolic suggesting that they were benign. There were no hypermetabolic areas within the skeleton suggesting that the skeletal lesions seen on CT scan were benign. There was a hypermetabolic nodule in the inferior aspect of the right lobe of the thyroid gland with an SUV max of 6.8. An MRI of the brain showed no evidence of metastatic disease. Since the right lower lobe lung nodule had  increased in size compared to the CT scan in August of 2012 and had low level hypermetabolic activity, I felt that CT guided needle biopsy was indicated. This was performed by interventional radiology on 12/23/2012. It was complicated by some intrapulmonary hemorrhage associated with hemoptysis and the patient required admission to the hospital overnight for observation. The final pathology showed benign lung tissue with no evidence of granulomas or malignancy. She subsequently had an ultrasound-guided needle biopsy of the right thyroid nodule which showed benign follicular tissue.  She currently denies any further hemoptysis. Her only complaint is feeling that something is stuck in her throat when she tries to swallow. This sensation has been present for a couple months.   Current Outpatient Prescriptions  Medication Sig Dispense Refill  . carvedilol (COREG) 3.125 MG tablet Take 1 tablet (3.125 mg total) by mouth 2 (two) times daily.  60 tablet  6  . clonazePAM (KLONOPIN) 0.5 MG tablet Take 0.5 mg by mouth 3 (three) times daily.       . clopidogrel (PLAVIX) 75 MG tablet Take 75 mg by mouth daily.      . ferrous sulfate 325 (65 FE) MG tablet Take 325 mg by mouth daily with breakfast.      . FLUoxetine (PROZAC) 20 MG capsule Take 20 mg by mouth 3 (three) times daily.       Marland Kitchen  furosemide (LASIX) 40 MG tablet Take 40 mg by mouth 2 (two) times daily.       Marland Kitchen glipiZIDE (GLUCOTROL) 5 MG tablet Take 10 mg by mouth daily.       Marland Kitchen HYDROcodone-acetaminophen (NORCO/VICODIN) 5-325 MG per tablet Take 1 tablet by mouth every 6 (six) hours as needed for pain.      Marland Kitchen insulin detemir (LEVEMIR) 100 UNIT/ML injection Inject 14-25 Units into the skin at bedtime. Based on sugar levels      . ondansetron (ZOFRAN) 8 MG tablet Take 8 mg by mouth every 8 (eight) hours as needed for nausea.       Marland Kitchen oxybutynin (DITROPAN) 5 MG tablet Take 5 mg by mouth 2 (two) times daily.       . simvastatin (ZOCOR) 20 MG tablet Take 20 mg by  mouth every evening.        No current facility-administered medications for this visit.     Physical Exam: BP 157/79  Pulse 57  Resp 18  Ht 5\' 1"  (1.549 m)  Wt 180 lb (81.647 kg)  BMI 34.03 kg/m2  SpO2 98% She appears chronically ill and very slow moving, probably due to her prior stroke and debilitation. There is no cervical or supraclavicular adenopathy. Her lung exam is clear. Cardiac exam shows a regular rate and rhythm with normal heart sounds.    Impression:  The biopsy of the right lower lobe lung nodule shows benign lung tissue. I remain somewhat concerned about this lesion since it has increased in size over the past few years. It did have low level hypermetabolic activity on PET scan and could conceivably be a slow-growing adenocarcinoma. The lesion seemed large enough to get an accurate CT guided needle biopsy so hopefully this is not a false negative result. I think we should do a followup CT scan in about 4 months to reevaluate this lesion. Fortunately for her, the liver and skeletal lesions appear to be benign and the thyroid nodule is also benign. I reviewed all of the scans with the patient and her family. I discussed the plans for a repeat CT scan in 4 months. They're in agreement with that plan.  Plan:  She will return to see me in 4 months with a CT scan the chest.

## 2013-01-31 ENCOUNTER — Other Ambulatory Visit: Payer: Self-pay | Admitting: Physician Assistant

## 2013-04-18 ENCOUNTER — Other Ambulatory Visit: Payer: Self-pay | Admitting: *Deleted

## 2013-04-18 DIAGNOSIS — R911 Solitary pulmonary nodule: Secondary | ICD-10-CM

## 2013-05-11 ENCOUNTER — Ambulatory Visit
Admission: RE | Admit: 2013-05-11 | Discharge: 2013-05-11 | Disposition: A | Discharge status: Home / Self Care | Payer: Medicare Other | Source: Ambulatory Visit | Attending: Surgery | Admitting: Surgery

## 2013-05-11 ENCOUNTER — Encounter: Payer: Self-pay | Admitting: Surgery

## 2013-05-11 ENCOUNTER — Other Ambulatory Visit: Payer: Medicare Other

## 2013-05-11 ENCOUNTER — Ambulatory Visit (INDEPENDENT_AMBULATORY_CARE_PROVIDER_SITE_OTHER): Payer: Medicare Other | Admitting: Surgery

## 2013-05-11 VITALS — BP 152/65 | HR 52 | Resp 16 | Ht 61.0 in | Wt 180.0 lb

## 2013-05-11 DIAGNOSIS — R911 Solitary pulmonary nodule: Secondary | ICD-10-CM

## 2013-05-11 DIAGNOSIS — Z7189 Other specified counseling: Secondary | ICD-10-CM

## 2013-05-11 DIAGNOSIS — Z712 Person consulting for explanation of examination or test findings: Secondary | ICD-10-CM

## 2013-05-11 NOTE — Progress Notes (Signed)
[address]       [place] [address]             [phone]        HPI:  The patient returned office today for followup of a right lower lobe lung nodule which was biopsied under CT guidance on 12/23/2012. The biopsy of the right lower lobe lung nodule showed benign lung tissue. I remained somewhat concerned about this lesion since it had increased in size over the past few years. It did have low level hypermetabolic activity on PET scan and could conceivably be a slow-growing adenocarcinoma. The lesion seemed large enough to get an accurate CT guided needle biopsy so hopefully this was not a false negative result. I thought we should do a followup CT scan in about 4 months to reevaluate this lesion. Fortunately for her, the liver and skeletal lesions appear to be benign since they did not have hypermetabolic activity on PET scan and the thyroid nodule is also benign. Since I last saw her she said that she has been feeling about the same. Her main complaint is of pain which her and her husband feel are due to arthritis. She denies any cough or sputum production. She has had no hemoptysis. Her appetite has been good and her weight has been stable.   Current Outpatient Prescriptions  Medication Sig Dispense Refill  . carvedilol (COREG) 3.125 MG tablet TAKE (1) TABLET TWICE DAILY.  60 tablet  0  . clonazePAM (KLONOPIN) 0.5 MG tablet Take 0.5 mg by mouth 3 (three) times daily.       . clopidogrel (PLAVIX) 75 MG tablet Take 75 mg by mouth daily.      . ferrous sulfate 325 (65 FE) MG tablet Take 325 mg by mouth daily with breakfast.      . FLUoxetine (PROZAC) 20 MG capsule Take 20 mg by mouth 3 (three) times daily.       . furosemide (LASIX) 40 MG tablet Take 40 mg by mouth 2 (two) times daily.       Marland Kitchen glipiZIDE (GLUCOTROL) 5 MG tablet Take 10 mg by mouth daily.       Marland Kitchen HYDROcodone-acetaminophen (NORCO/VICODIN) 5-325 MG per tablet Take 1 tablet by mouth every 6 (six)  hours as needed for pain.      Marland Kitchen insulin detemir (LEVEMIR) 100 UNIT/ML injection Inject 14-25 Units into the skin at bedtime. Based on sugar levels      . ondansetron (ZOFRAN) 8 MG tablet Take 8 mg by mouth every 8 (eight) hours as needed for nausea.       Marland Kitchen oxybutynin (DITROPAN) 5 MG tablet Take 5 mg by mouth 2 (two) times daily.       . simvastatin (ZOCOR) 20 MG tablet Take 20 mg by mouth every evening.        No current facility-administered medications for this visit.     Physical Exam:  BP 152/65  Pulse 52  Resp 16  Ht 5\' 1"  (1.549 m)  Wt 180 lb (81.647 kg)  BMI 34.03 kg/m2  SpO2 98% She is a chronically ill-appearing woman in no distress There is no cervical or supraclavicular adenopathy. Lung exam is clear. Cardiac exam shows a regular rate and rhythm with normal heart sounds.  Diagnostic Tests:  *RADIOLOGY REPORT*   Clinical Data: Follow-up lung nodule.  Cough.   CT CHEST LOW DOSE PILOT WITHOUT CONTRAST   Technique: Multidetector CT imaging of the  chest using the standard low-dose protocol without administration of intravenous contrast.   Comparison: PET 12/08/2012 and CT chest 11/26/2012.   Findings: Low attenuation nodules in the thyroid.  Measure up to approximately 1.7 cm on the left.  No pathologically enlarged mediastinal or axillary lymph nodes.  Hilar regions are difficult to definitively evaluate without IV contrast.  Atherosclerotic calcification of the arterial vasculature including extensive involvement of the coronary arteries.  Heart is at the upper limits of normal in size to mildly enlarged.  No pericardial effusion.   Mild biapical pleural parenchymal scarring with a slightly nodular component on the right, unchanged.  Right lower lobe nodule has decreased in size, measuring 1.8 x 1.8 cm (previously 2.0 x 2.8 cm).  There is some associated mucoid impaction in the adjacent right lower lobe.  A 5 mm subpleural left lower lobe nodule (image 133)  is unchanged.  There is patchy ground-glass in the mid and lower lung zones, possibly new or increased from the prior exam. Scattered pulmonary parenchymal scarring.  No pleural fluid. Airway is unremarkable.   Incidental imaging of the upper abdomen shows a 2.1 cm slightly exophytic lesion off the medial right hepatic lobe, unchanged. Adrenal glands are unremarkable.  No worrisome lytic or sclerotic lesions.  Degenerative changes are seen in the spine.   IMPRESSION:   1.  Interval decrease in size of a right lower lobe nodule. 2.  Increase in mid and lower lung zone patchy ground-glass may be due in part to expiratory phase imaging.  An atypical or viral infectious process cannot be excluded. 3.  Low attenuation lesions in the thyroid are grossly stable. Consider further evaluation with thyroid ultrasound.  If patient is clinically hyperthyroid, consider nuclear medicine thyroid uptake and scan.     Original Report Authenticated By: Lorin Picket, M.D.     Impression:  The right upper lobe lung nodule has decreased in size which suggests it is a benign lesion. The thyroid gland has already been worked up. I recommended that we repeat a low-dose CT scan the chest in 6 months to followup on this right lower lobe lung nodule.  Plan:  She will return to see me in 6 months with a low-dose CT scan the chest.

## 2013-06-06 ENCOUNTER — Other Ambulatory Visit: Payer: Self-pay | Admitting: Physician Assistant

## 2013-08-08 ENCOUNTER — Other Ambulatory Visit: Payer: Self-pay | Admitting: Cardiology

## 2013-08-22 ENCOUNTER — Telehealth: Payer: Self-pay | Admitting: *Deleted

## 2013-11-23 ENCOUNTER — Telehealth: Payer: Self-pay | Admitting: *Deleted

## 2014-08-30 ENCOUNTER — Other Ambulatory Visit: Payer: Self-pay | Admitting: *Deleted

## 2014-08-30 DIAGNOSIS — R911 Solitary pulmonary nodule: Secondary | ICD-10-CM

## 2014-09-13 ENCOUNTER — Other Ambulatory Visit: Payer: Medicare Other

## 2014-09-13 ENCOUNTER — Ambulatory Visit: Payer: Medicare Other | Admitting: Surgery

## 2014-09-27 ENCOUNTER — Ambulatory Visit (INDEPENDENT_AMBULATORY_CARE_PROVIDER_SITE_OTHER): Payer: Commercial Managed Care - HMO | Admitting: Surgery

## 2014-09-27 ENCOUNTER — Ambulatory Visit
Admission: RE | Admit: 2014-09-27 | Discharge: 2014-09-27 | Disposition: A | Discharge status: Home / Self Care | Payer: Commercial Managed Care - HMO | Source: Ambulatory Visit | Attending: Surgery | Admitting: Surgery

## 2014-09-27 ENCOUNTER — Encounter: Payer: Self-pay | Admitting: Surgery

## 2014-09-27 VITALS — BP 142/76 | HR 54 | Resp 16 | Ht 61.0 in | Wt 180.0 lb

## 2014-09-27 DIAGNOSIS — R911 Solitary pulmonary nodule: Secondary | ICD-10-CM

## 2014-09-27 DIAGNOSIS — E041 Nontoxic single thyroid nodule: Secondary | ICD-10-CM | POA: Diagnosis not present

## 2014-09-27 NOTE — Progress Notes (Signed)
HPI:  The patient returned office today for followup of a right lower lobe lung nodule which was biopsied under CT guidance on 12/23/2012. The biopsy of the right lower lobe lung nodule showed benign lung tissue. I last saw her on 05/11/2013 and a CT at that time showed that the RUL lung nodule had decreased in size suggesting a benign lesion. She also had a thyroid nodule that was biopsied and showed a benign follicular nodule. I was planning to see her back in 6 months for a repeat CT but she had a stroke and was lost to follow up.   Current Outpatient Prescriptions  Medication Sig Dispense Refill  . carvedilol (COREG) 3.125 MG tablet TAKE 1 TABLET BY MOUTH TWICE DAILY. PATIENT NEEDS TO BE SEEN. 60 tablet 3  . clonazePAM (KLONOPIN) 0.5 MG tablet Take 0.5 mg by mouth 3 (three) times daily.     . clopidogrel (PLAVIX) 75 MG tablet Take 75 mg by mouth daily.    . ferrous sulfate 325 (65 FE) MG tablet Take 325 mg by mouth daily with breakfast.    . FLUoxetine (PROZAC) 20 MG capsule Take 20 mg by mouth 3 (three) times daily.     . furosemide (LASIX) 40 MG tablet Take 40 mg by mouth 2 (two) times daily.     Marland Kitchen glipiZIDE (GLUCOTROL) 5 MG tablet Take 10 mg by mouth daily.     Marland Kitchen HYDROcodone-acetaminophen (NORCO/VICODIN) 5-325 MG per tablet Take 1 tablet by mouth every 6 (six) hours as needed for pain.    Marland Kitchen insulin detemir (LEVEMIR) 100 UNIT/ML injection Inject 14-25 Units into the skin at bedtime. Based on sugar levels    . ondansetron (ZOFRAN) 8 MG tablet Take 8 mg by mouth every 8 (eight) hours as needed for nausea.     Marland Kitchen oxybutynin (DITROPAN) 5 MG tablet Take 5 mg by mouth 2 (two) times daily.     . simvastatin (ZOCOR) 20 MG tablet Take 20 mg by mouth every evening.      No current facility-administered medications for this visit.     Physical Exam: BP 142/76 mmHg  Pulse 54  Resp 16  Ht 5\' 1"  (1.549 m)  Wt 180 lb (81.647 kg)  BMI 34.03 kg/m2  SpO2 96% She is in a wheel chair and  looks weak There is no cervical or supraclavicular adenopathy. Lung exam is clear. Cardiac exam shows a regular rate and rhythm with normal heart sounds.   Diagnostic Tests:   CLINICAL DATA: Follow up pulmonary nodule. Nonsmoker without history of malignancy. Subsequent encounter.  EXAM: CT CHEST WITHOUT CONTRAST  TECHNIQUE: Multidetector CT imaging of the chest was performed following the standard protocol without IV contrast.  COMPARISON: Chest CT 05/11/2013 and 11/26/2012. PET-CT 12/08/2012.  FINDINGS: Mediastinum: There are no enlarged mediastinal or hilar lymph nodes. There is a grossly stable nodule in the left thyroid lobe measuring up to 1.9 cm on image 11. The trachea and esophagus demonstrate no significant findings. The heart size is normal. There is no pericardial effusion.There are diffuse coronary artery calcifications with relatively mild atherosclerosis of the aorta and great vessels.  Lungs/Pleura: There is no pleural effusion.There is central central cavitation of the right lower lobe nodule which is unchanged in overall size, measuring up to 1.8 cm in diameter on image number 36. Adjacent central airway thickening appears unchanged. 5 mm subpleural nodule in the left lower lobe on image 34 is unchanged. There are no new or  enlarging pulmonary nodules.  Upper abdomen: The upper abdomen has a stable appearance without suspicious findings.  Musculoskeletal/Chest wall: There is no axillary adenopathy or chest wall mass. There are no worrisome osseous findings.  IMPRESSION: 1. Interval cavitation of previously demonstrated right lower lobe nodule which is otherwise unchanged in size. Since this is decreased in size from the baseline study, this most likely reflects a resolving inflammatory/infectious nodule. Correlate clinically. 2. Grossly stable thyroid nodularity. 3. Diffuse atherosclerosis.   Electronically Signed  By: Camie Patience  M.D.  On: 09/27/2014 13:46      Impression:  This RLL lung nodule is about the same size as it was in 04/2013 but it is now cavitated and less dense and is probably a resolving inflammatory or infectious lesion with residual scarring. I don't think further follow up of this is needed in this 81 year old frail patient.  Plan:  She will continue to follow up with her primary physician.

## 2014-10-04 DIAGNOSIS — M47816 Spondylosis without myelopathy or radiculopathy, lumbar region: Secondary | ICD-10-CM | POA: Diagnosis not present

## 2014-10-04 DIAGNOSIS — M5136 Other intervertebral disc degeneration, lumbar region: Secondary | ICD-10-CM | POA: Diagnosis not present

## 2014-10-04 DIAGNOSIS — M545 Low back pain: Secondary | ICD-10-CM | POA: Diagnosis not present

## 2014-10-04 DIAGNOSIS — M4806 Spinal stenosis, lumbar region: Secondary | ICD-10-CM | POA: Diagnosis not present

## 2014-10-11 DIAGNOSIS — E782 Mixed hyperlipidemia: Secondary | ICD-10-CM | POA: Diagnosis not present

## 2014-10-11 DIAGNOSIS — E1122 Type 2 diabetes mellitus with diabetic chronic kidney disease: Secondary | ICD-10-CM | POA: Diagnosis not present

## 2014-10-11 DIAGNOSIS — F331 Major depressive disorder, recurrent, moderate: Secondary | ICD-10-CM | POA: Diagnosis not present

## 2014-10-11 DIAGNOSIS — G301 Alzheimer's disease with late onset: Secondary | ICD-10-CM | POA: Diagnosis not present

## 2014-10-11 DIAGNOSIS — I10 Essential (primary) hypertension: Secondary | ICD-10-CM | POA: Diagnosis not present

## 2014-10-18 DIAGNOSIS — F331 Major depressive disorder, recurrent, moderate: Secondary | ICD-10-CM | POA: Diagnosis not present

## 2014-10-18 DIAGNOSIS — E782 Mixed hyperlipidemia: Secondary | ICD-10-CM | POA: Diagnosis not present

## 2014-10-18 DIAGNOSIS — G301 Alzheimer's disease with late onset: Secondary | ICD-10-CM | POA: Diagnosis not present

## 2014-10-18 DIAGNOSIS — E1122 Type 2 diabetes mellitus with diabetic chronic kidney disease: Secondary | ICD-10-CM | POA: Diagnosis not present

## 2014-10-18 DIAGNOSIS — I10 Essential (primary) hypertension: Secondary | ICD-10-CM | POA: Diagnosis not present

## 2014-10-18 DIAGNOSIS — N183 Chronic kidney disease, stage 3 (moderate): Secondary | ICD-10-CM | POA: Diagnosis not present

## 2014-10-18 DIAGNOSIS — Z9189 Other specified personal risk factors, not elsewhere classified: Secondary | ICD-10-CM | POA: Diagnosis not present

## 2014-10-18 DIAGNOSIS — Z1389 Encounter for screening for other disorder: Secondary | ICD-10-CM | POA: Diagnosis not present

## 2014-12-12 ENCOUNTER — Other Ambulatory Visit: Payer: Self-pay | Admitting: *Deleted

## 2014-12-12 NOTE — Patient Outreach (Addendum)
[facility]) Care Management  12/12/2014  Norma [name] 1934-04-21 [id number]  Previous notes in [employer].  Referral per [employer].  Telephone call to patient; left message on voice mail requesting call back.  Will send outreach letter. Follow up in 10 days.    Sherrin Daisy, RN BSN [place] Management Coordinator [facility] Care Management  [phone]

## 2014-12-28 ENCOUNTER — Encounter: Payer: Self-pay | Admitting: *Deleted

## 2015-01-02 DIAGNOSIS — M47816 Spondylosis without myelopathy or radiculopathy, lumbar region: Secondary | ICD-10-CM | POA: Diagnosis not present

## 2015-01-02 DIAGNOSIS — M25562 Pain in left knee: Secondary | ICD-10-CM | POA: Diagnosis not present

## 2015-01-02 DIAGNOSIS — M4806 Spinal stenosis, lumbar region: Secondary | ICD-10-CM | POA: Diagnosis not present

## 2015-01-02 DIAGNOSIS — Z79891 Long term (current) use of opiate analgesic: Secondary | ICD-10-CM | POA: Diagnosis not present

## 2015-01-02 DIAGNOSIS — M25561 Pain in right knee: Secondary | ICD-10-CM | POA: Diagnosis not present

## 2015-01-09 ENCOUNTER — Encounter: Payer: Self-pay | Admitting: *Deleted

## 2015-01-09 NOTE — Patient Outreach (Signed)
[facility]) Care Management  01/09/2015  Norma [name] 26-Mar-1934 [id number]   No response to outreach letter or 3 telephone call attempts previously.  Plan: close out case and send closure letter to MD  Sherrin Daisy, RN BSN [place] Management Coordinator [facility] Care Management  [phone].

## 2015-01-10 NOTE — Patient Outreach (Signed)
[facility]) Care Management  01/10/2015  JASREET ACE [birth year] [id number]   Received notification from Sherrin Daisy, RN to close case due to unable to contact patient.  Ronnell Freshwater. [place] CM Assistant Phone: [phone] Fax: [phone]

## 2015-02-21 DIAGNOSIS — E876 Hypokalemia: Secondary | ICD-10-CM | POA: Diagnosis not present

## 2015-02-21 DIAGNOSIS — N183 Chronic kidney disease, stage 3 (moderate): Secondary | ICD-10-CM | POA: Diagnosis not present

## 2015-02-21 DIAGNOSIS — I10 Essential (primary) hypertension: Secondary | ICD-10-CM | POA: Diagnosis not present

## 2015-02-21 DIAGNOSIS — E782 Mixed hyperlipidemia: Secondary | ICD-10-CM | POA: Diagnosis not present

## 2015-02-21 DIAGNOSIS — E1122 Type 2 diabetes mellitus with diabetic chronic kidney disease: Secondary | ICD-10-CM | POA: Diagnosis not present

## 2015-02-26 DIAGNOSIS — N390 Urinary tract infection, site not specified: Secondary | ICD-10-CM | POA: Diagnosis not present

## 2015-03-01 DIAGNOSIS — E782 Mixed hyperlipidemia: Secondary | ICD-10-CM | POA: Diagnosis not present

## 2015-03-01 DIAGNOSIS — J301 Allergic rhinitis due to pollen: Secondary | ICD-10-CM | POA: Diagnosis not present

## 2015-03-01 DIAGNOSIS — I69351 Hemiplegia and hemiparesis following cerebral infarction affecting right dominant side: Secondary | ICD-10-CM | POA: Diagnosis not present

## 2015-03-01 DIAGNOSIS — F331 Major depressive disorder, recurrent, moderate: Secondary | ICD-10-CM | POA: Diagnosis not present

## 2015-03-01 DIAGNOSIS — I10 Essential (primary) hypertension: Secondary | ICD-10-CM | POA: Diagnosis not present

## 2015-03-01 DIAGNOSIS — N183 Chronic kidney disease, stage 3 (moderate): Secondary | ICD-10-CM | POA: Diagnosis not present

## 2015-03-01 DIAGNOSIS — E1122 Type 2 diabetes mellitus with diabetic chronic kidney disease: Secondary | ICD-10-CM | POA: Diagnosis not present

## 2015-03-01 DIAGNOSIS — G301 Alzheimer's disease with late onset: Secondary | ICD-10-CM | POA: Diagnosis not present

## 2015-03-09 DIAGNOSIS — M25561 Pain in right knee: Secondary | ICD-10-CM | POA: Diagnosis not present

## 2015-03-09 DIAGNOSIS — M2242 Chondromalacia patellae, left knee: Secondary | ICD-10-CM | POA: Diagnosis not present

## 2015-03-09 DIAGNOSIS — G8929 Other chronic pain: Secondary | ICD-10-CM | POA: Diagnosis not present

## 2015-03-09 DIAGNOSIS — M25562 Pain in left knee: Secondary | ICD-10-CM | POA: Diagnosis not present

## 2015-03-09 DIAGNOSIS — Z8673 Personal history of transient ischemic attack (TIA), and cerebral infarction without residual deficits: Secondary | ICD-10-CM | POA: Diagnosis not present

## 2015-03-09 DIAGNOSIS — M170 Bilateral primary osteoarthritis of knee: Secondary | ICD-10-CM | POA: Diagnosis not present

## 2015-03-09 DIAGNOSIS — E119 Type 2 diabetes mellitus without complications: Secondary | ICD-10-CM | POA: Diagnosis not present

## 2015-03-09 DIAGNOSIS — M2241 Chondromalacia patellae, right knee: Secondary | ICD-10-CM | POA: Diagnosis not present

## 2015-03-09 DIAGNOSIS — I10 Essential (primary) hypertension: Secondary | ICD-10-CM | POA: Diagnosis not present

## 2015-04-09 DIAGNOSIS — N390 Urinary tract infection, site not specified: Secondary | ICD-10-CM | POA: Diagnosis not present

## 2015-04-23 DIAGNOSIS — G8929 Other chronic pain: Secondary | ICD-10-CM | POA: Diagnosis not present

## 2015-04-23 DIAGNOSIS — M25562 Pain in left knee: Secondary | ICD-10-CM | POA: Diagnosis not present

## 2015-04-23 DIAGNOSIS — M4806 Spinal stenosis, lumbar region: Secondary | ICD-10-CM | POA: Diagnosis not present

## 2015-04-23 DIAGNOSIS — M1990 Unspecified osteoarthritis, unspecified site: Secondary | ICD-10-CM | POA: Diagnosis not present

## 2015-04-23 DIAGNOSIS — Z8673 Personal history of transient ischemic attack (TIA), and cerebral infarction without residual deficits: Secondary | ICD-10-CM | POA: Diagnosis not present

## 2015-04-23 DIAGNOSIS — M25561 Pain in right knee: Secondary | ICD-10-CM | POA: Diagnosis not present

## 2015-04-23 DIAGNOSIS — Z79899 Other long term (current) drug therapy: Secondary | ICD-10-CM | POA: Diagnosis not present

## 2015-04-23 DIAGNOSIS — M47816 Spondylosis without myelopathy or radiculopathy, lumbar region: Secondary | ICD-10-CM | POA: Diagnosis not present

## 2015-04-23 DIAGNOSIS — E119 Type 2 diabetes mellitus without complications: Secondary | ICD-10-CM | POA: Diagnosis not present

## 2015-05-16 DIAGNOSIS — M545 Low back pain: Secondary | ICD-10-CM | POA: Diagnosis not present

## 2015-05-16 DIAGNOSIS — M4806 Spinal stenosis, lumbar region: Secondary | ICD-10-CM | POA: Diagnosis not present

## 2015-05-16 DIAGNOSIS — M5136 Other intervertebral disc degeneration, lumbar region: Secondary | ICD-10-CM | POA: Diagnosis not present

## 2015-05-16 DIAGNOSIS — M47816 Spondylosis without myelopathy or radiculopathy, lumbar region: Secondary | ICD-10-CM | POA: Diagnosis not present

## 2015-08-16 DIAGNOSIS — H1132 Conjunctival hemorrhage, left eye: Secondary | ICD-10-CM | POA: Diagnosis not present

## 2015-08-20 DIAGNOSIS — M25511 Pain in right shoulder: Secondary | ICD-10-CM | POA: Diagnosis not present

## 2015-08-20 DIAGNOSIS — K5909 Other constipation: Secondary | ICD-10-CM | POA: Diagnosis not present

## 2015-08-20 DIAGNOSIS — M5136 Other intervertebral disc degeneration, lumbar region: Secondary | ICD-10-CM | POA: Diagnosis not present

## 2015-08-20 DIAGNOSIS — Z79891 Long term (current) use of opiate analgesic: Secondary | ICD-10-CM | POA: Diagnosis not present

## 2015-09-25 DIAGNOSIS — R319 Hematuria, unspecified: Secondary | ICD-10-CM | POA: Diagnosis not present

## 2015-09-25 DIAGNOSIS — R5383 Other fatigue: Secondary | ICD-10-CM | POA: Diagnosis not present

## 2015-10-17 DIAGNOSIS — R5383 Other fatigue: Secondary | ICD-10-CM | POA: Diagnosis not present

## 2015-10-17 DIAGNOSIS — R1013 Epigastric pain: Secondary | ICD-10-CM | POA: Diagnosis not present

## 2015-12-20 DIAGNOSIS — Z79891 Long term (current) use of opiate analgesic: Secondary | ICD-10-CM | POA: Diagnosis not present

## 2016-02-12 DIAGNOSIS — R5383 Other fatigue: Secondary | ICD-10-CM | POA: Diagnosis not present

## 2016-02-12 DIAGNOSIS — E1122 Type 2 diabetes mellitus with diabetic chronic kidney disease: Secondary | ICD-10-CM | POA: Diagnosis not present

## 2016-02-12 DIAGNOSIS — E876 Hypokalemia: Secondary | ICD-10-CM | POA: Diagnosis not present

## 2016-02-12 DIAGNOSIS — I10 Essential (primary) hypertension: Secondary | ICD-10-CM | POA: Diagnosis not present

## 2016-02-12 DIAGNOSIS — K219 Gastro-esophageal reflux disease without esophagitis: Secondary | ICD-10-CM | POA: Diagnosis not present

## 2016-02-12 DIAGNOSIS — E782 Mixed hyperlipidemia: Secondary | ICD-10-CM | POA: Diagnosis not present

## 2016-02-12 DIAGNOSIS — N183 Chronic kidney disease, stage 3 (moderate): Secondary | ICD-10-CM | POA: Diagnosis not present

## 2016-02-15 DIAGNOSIS — E782 Mixed hyperlipidemia: Secondary | ICD-10-CM | POA: Diagnosis not present

## 2016-02-15 DIAGNOSIS — I10 Essential (primary) hypertension: Secondary | ICD-10-CM | POA: Diagnosis not present

## 2016-02-15 DIAGNOSIS — J301 Allergic rhinitis due to pollen: Secondary | ICD-10-CM | POA: Diagnosis not present

## 2016-02-15 DIAGNOSIS — N183 Chronic kidney disease, stage 3 (moderate): Secondary | ICD-10-CM | POA: Diagnosis not present

## 2016-02-15 DIAGNOSIS — F331 Major depressive disorder, recurrent, moderate: Secondary | ICD-10-CM | POA: Diagnosis not present

## 2016-02-15 DIAGNOSIS — I69351 Hemiplegia and hemiparesis following cerebral infarction affecting right dominant side: Secondary | ICD-10-CM | POA: Diagnosis not present

## 2016-02-15 DIAGNOSIS — E1122 Type 2 diabetes mellitus with diabetic chronic kidney disease: Secondary | ICD-10-CM | POA: Diagnosis not present

## 2016-02-15 DIAGNOSIS — G301 Alzheimer's disease with late onset: Secondary | ICD-10-CM | POA: Diagnosis not present

## 2016-02-26 ENCOUNTER — Observation Stay (HOSPITAL_COMMUNITY)
Admission: EM | Admit: 2016-02-26 | Discharge: 2016-02-27 | Disposition: A | Discharge status: Home / Self Care | Payer: Commercial Managed Care - HMO | Attending: Internal Medicine | Admitting: Internal Medicine

## 2016-02-26 ENCOUNTER — Encounter (HOSPITAL_COMMUNITY): Payer: Self-pay | Admitting: Emergency Medicine

## 2016-02-26 ENCOUNTER — Emergency Department (HOSPITAL_COMMUNITY): Payer: Commercial Managed Care - HMO

## 2016-02-26 DIAGNOSIS — I2510 Atherosclerotic heart disease of native coronary artery without angina pectoris: Secondary | ICD-10-CM | POA: Diagnosis not present

## 2016-02-26 DIAGNOSIS — I130 Hypertensive heart and chronic kidney disease with heart failure and stage 1 through stage 4 chronic kidney disease, or unspecified chronic kidney disease: Secondary | ICD-10-CM | POA: Diagnosis not present

## 2016-02-26 DIAGNOSIS — I5021 Acute systolic (congestive) heart failure: Secondary | ICD-10-CM | POA: Diagnosis not present

## 2016-02-26 DIAGNOSIS — R531 Weakness: Secondary | ICD-10-CM

## 2016-02-26 DIAGNOSIS — N390 Urinary tract infection, site not specified: Secondary | ICD-10-CM | POA: Diagnosis not present

## 2016-02-26 DIAGNOSIS — N189 Chronic kidney disease, unspecified: Secondary | ICD-10-CM | POA: Insufficient documentation

## 2016-02-26 DIAGNOSIS — I420 Dilated cardiomyopathy: Secondary | ICD-10-CM

## 2016-02-26 DIAGNOSIS — Z794 Long term (current) use of insulin: Secondary | ICD-10-CM | POA: Insufficient documentation

## 2016-02-26 DIAGNOSIS — N183 Chronic kidney disease, stage 3 unspecified: Secondary | ICD-10-CM | POA: Diagnosis present

## 2016-02-26 DIAGNOSIS — Z8673 Personal history of transient ischemic attack (TIA), and cerebral infarction without residual deficits: Secondary | ICD-10-CM | POA: Diagnosis not present

## 2016-02-26 DIAGNOSIS — R509 Fever, unspecified: Secondary | ICD-10-CM | POA: Diagnosis not present

## 2016-02-26 DIAGNOSIS — E119 Type 2 diabetes mellitus without complications: Secondary | ICD-10-CM | POA: Diagnosis not present

## 2016-02-26 DIAGNOSIS — Z79899 Other long term (current) drug therapy: Secondary | ICD-10-CM | POA: Diagnosis not present

## 2016-02-26 LAB — URINALYSIS, ROUTINE W REFLEX MICROSCOPIC
BILIRUBIN URINE: NEGATIVE
Glucose, UA: 500 mg/dL — AB
KETONES UR: NEGATIVE mg/dL
NITRITE: POSITIVE — AB
PH: 5.5 (ref 5.0–8.0)
PROTEIN: 30 mg/dL — AB
Specific Gravity, Urine: 1.02 (ref 1.005–1.030)

## 2016-02-26 LAB — CBC
HEMATOCRIT: 35.4 % — AB (ref 36.0–46.0)
Hemoglobin: 11.5 g/dL — ABNORMAL LOW (ref 12.0–15.0)
MCH: 27.7 pg (ref 26.0–34.0)
MCHC: 32.5 g/dL (ref 30.0–36.0)
MCV: 85.3 fL (ref 78.0–100.0)
PLATELETS: 253 10*3/uL (ref 150–400)
RBC: 4.15 MIL/uL (ref 3.87–5.11)
RDW: 13.8 % (ref 11.5–15.5)
WBC: 9.8 10*3/uL (ref 4.0–10.5)

## 2016-02-26 LAB — COMPREHENSIVE METABOLIC PANEL
ALBUMIN: 3.4 g/dL — AB (ref 3.5–5.0)
ALK PHOS: 99 U/L (ref 38–126)
ALT: 11 U/L — AB (ref 14–54)
ANION GAP: 7 (ref 5–15)
AST: 13 U/L — AB (ref 15–41)
BILIRUBIN TOTAL: 0.9 mg/dL (ref 0.3–1.2)
BUN: 16 mg/dL (ref 6–20)
CALCIUM: 8.5 mg/dL — AB (ref 8.9–10.3)
CO2: 26 mmol/L (ref 22–32)
Chloride: 100 mmol/L — ABNORMAL LOW (ref 101–111)
Creatinine, Ser: 1.26 mg/dL — ABNORMAL HIGH (ref 0.44–1.00)
GFR calc Af Amer: 46 mL/min — ABNORMAL LOW (ref >60)
GFR, EST NON AFRICAN AMERICAN: 39 mL/min — AB (ref >60)
Glucose, Bld: 252 mg/dL — ABNORMAL HIGH (ref 65–99)
POTASSIUM: 3.6 mmol/L (ref 3.5–5.1)
Sodium: 133 mmol/L — ABNORMAL LOW (ref 135–145)
TOTAL PROTEIN: 6.7 g/dL (ref 6.5–8.1)

## 2016-02-26 LAB — URINE MICROSCOPIC-ADD ON

## 2016-02-26 LAB — TROPONIN I: TROPONIN I: 0.03 ng/mL (ref <0.031)

## 2016-02-26 LAB — CBG MONITORING, ED: GLUCOSE-CAPILLARY: 245 mg/dL — AB (ref 65–99)

## 2016-02-26 LAB — MRSA PCR SCREENING: MRSA BY PCR: POSITIVE — AB

## 2016-02-26 LAB — GLUCOSE, CAPILLARY
GLUCOSE-CAPILLARY: 349 mg/dL — AB (ref 65–99)
GLUCOSE-CAPILLARY: 186 mg/dL — AB (ref 65–99)

## 2016-02-26 LAB — TSH: TSH: 0.73 u[IU]/mL (ref 0.350–4.500)

## 2016-02-26 LAB — I-STAT CG4 LACTIC ACID, ED: Lactic Acid, Venous: 1 mmol/L (ref 0.5–2.0)

## 2016-02-26 MED ORDER — FLUOXETINE HCL 20 MG PO CAPS
60.0000 mg | ORAL_CAPSULE | Freq: Every day | ORAL | Status: DC
  Administered 2016-02-26 – 2016-02-27 (×2): 60 mg via ORAL
  Filled 2016-02-26 (×2): qty 3

## 2016-02-26 MED ORDER — CLONAZEPAM 0.5 MG PO TABS: 0.5000 mg | ORAL_TABLET | Freq: Three times a day (TID) | ORAL | Status: DC | PRN

## 2016-02-26 MED ORDER — SIMVASTATIN 20 MG PO TABS
20.0000 mg | ORAL_TABLET | Freq: Every evening | ORAL | Status: DC
  Administered 2016-02-26: 20 mg via ORAL
  Filled 2016-02-26: qty 1

## 2016-02-26 MED ORDER — PANTOPRAZOLE SODIUM 40 MG PO TBEC
40.0000 mg | DELAYED_RELEASE_TABLET | Freq: Every day | ORAL | Status: DC
  Administered 2016-02-26 – 2016-02-27 (×2): 40 mg via ORAL
  Filled 2016-02-26 (×2): qty 1

## 2016-02-26 MED ORDER — INSULIN DETEMIR 100 UNIT/ML ~~LOC~~ SOLN
10.0000 [IU] | Freq: Every day | SUBCUTANEOUS | Status: DC
  Administered 2016-02-26: 10 [IU] via SUBCUTANEOUS
  Filled 2016-02-26 (×2): qty 0.1

## 2016-02-26 MED ORDER — MUPIROCIN 2 % EX OINT
1.0000 "application " | TOPICAL_OINTMENT | Freq: Two times a day (BID) | CUTANEOUS | Status: DC
  Administered 2016-02-26 – 2016-02-27 (×2): 1 via NASAL
  Filled 2016-02-26: qty 22

## 2016-02-26 MED ORDER — CLOPIDOGREL BISULFATE 75 MG PO TABS
75.0000 mg | ORAL_TABLET | Freq: Every day | ORAL | Status: DC
  Administered 2016-02-26 – 2016-02-27 (×2): 75 mg via ORAL
  Filled 2016-02-26 (×2): qty 1

## 2016-02-26 MED ORDER — ONDANSETRON HCL 4 MG/2ML IJ SOLN: 4.0000 mg | Freq: Once | INTRAMUSCULAR | Status: AC | PRN

## 2016-02-26 MED ORDER — ACETAMINOPHEN 650 MG RE SUPP: 650.0000 mg | Freq: Four times a day (QID) | RECTAL | Status: DC | PRN

## 2016-02-26 MED ORDER — CHLORHEXIDINE GLUCONATE CLOTH 2 % EX PADS
6.0000 | MEDICATED_PAD | Freq: Every day | CUTANEOUS | Status: DC
  Administered 2016-02-27: 6 via TOPICAL

## 2016-02-26 MED ORDER — CARVEDILOL 3.125 MG PO TABS
3.1250 mg | ORAL_TABLET | Freq: Two times a day (BID) | ORAL | Status: DC
  Administered 2016-02-26 – 2016-02-27 (×2): 3.125 mg via ORAL
  Filled 2016-02-26 (×2): qty 1

## 2016-02-26 MED ORDER — DEXTROSE 5 % IV SOLN
1.0000 g | INTRAVENOUS | Status: DC
  Administered 2016-02-26 – 2016-02-27 (×2): 1 g via INTRAVENOUS
  Filled 2016-02-26 (×3): qty 10

## 2016-02-26 MED ORDER — INSULIN ASPART 100 UNIT/ML ~~LOC~~ SOLN: 0.0000 [IU] | Freq: Every day | SUBCUTANEOUS | Status: DC

## 2016-02-26 MED ORDER — ONDANSETRON HCL 4 MG/2ML IJ SOLN: 4.0000 mg | Freq: Four times a day (QID) | INTRAMUSCULAR | Status: DC | PRN

## 2016-02-26 MED ORDER — HEPARIN SODIUM (PORCINE) 5000 UNIT/ML IJ SOLN
5000.0000 [IU] | Freq: Three times a day (TID) | INTRAMUSCULAR | Status: DC
  Administered 2016-02-26 – 2016-02-27 (×3): 5000 [IU] via SUBCUTANEOUS
  Filled 2016-02-26 (×3): qty 1

## 2016-02-26 MED ORDER — HYDROCODONE-ACETAMINOPHEN 5-325 MG PO TABS
1.0000 | ORAL_TABLET | Freq: Once | ORAL | Status: AC
  Administered 2016-02-26: 1 via ORAL
  Filled 2016-02-26: qty 1

## 2016-02-26 MED ORDER — LISINOPRIL 10 MG PO TABS
20.0000 mg | ORAL_TABLET | Freq: Every day | ORAL | Status: DC
  Administered 2016-02-26 – 2016-02-27 (×2): 20 mg via ORAL
  Filled 2016-02-26 (×2): qty 2

## 2016-02-26 MED ORDER — OXYCODONE HCL 5 MG PO TABS
15.0000 mg | ORAL_TABLET | ORAL | Status: DC | PRN
  Administered 2016-02-26 – 2016-02-27 (×4): 15 mg via ORAL
  Filled 2016-02-26 (×4): qty 3

## 2016-02-26 MED ORDER — ONDANSETRON HCL 4 MG/2ML IJ SOLN
INTRAMUSCULAR | Status: AC
  Administered 2016-02-26: 4 mg via INTRAVENOUS
  Filled 2016-02-26: qty 2

## 2016-02-26 MED ORDER — ACETAMINOPHEN 325 MG PO TABS
650.0000 mg | ORAL_TABLET | Freq: Four times a day (QID) | ORAL | Status: DC | PRN
  Administered 2016-02-26: 650 mg via ORAL
  Filled 2016-02-26: qty 2

## 2016-02-26 MED ORDER — SODIUM CHLORIDE 0.9 % IV SOLN
1000.0000 mL | INTRAVENOUS | Status: DC
  Administered 2016-02-26 – 2016-02-27 (×2): 1000 mL via INTRAVENOUS

## 2016-02-26 MED ORDER — ONDANSETRON HCL 4 MG PO TABS: 4.0000 mg | ORAL_TABLET | Freq: Four times a day (QID) | ORAL | Status: DC | PRN

## 2016-02-26 MED ORDER — DEXTROSE 5 % IV SOLN: 1.0000 g | INTRAVENOUS | Status: DC

## 2016-02-26 MED ORDER — INSULIN ASPART 100 UNIT/ML ~~LOC~~ SOLN
0.0000 [IU] | Freq: Three times a day (TID) | SUBCUTANEOUS | Status: DC
  Administered 2016-02-26: 11 [IU] via SUBCUTANEOUS
  Administered 2016-02-27: 5 [IU] via SUBCUTANEOUS
  Administered 2016-02-27: 2 [IU] via SUBCUTANEOUS

## 2016-02-26 NOTE — ED Notes (Signed)
Pt c/o n/v upon arrival to ED. Pt had small (50cc) amount of light brown emesis. Zofran given.

## 2016-02-26 NOTE — ED Notes (Addendum)
Pt c/o generalized weakness and fever this am. Pt received 1000mg  tylenol by ems.

## 2016-02-26 NOTE — ED Notes (Signed)
Unable to ambulate. Stood pt up with assist x 1, but unable to keep balance

## 2016-02-26 NOTE — H&P (Addendum)
[facility] History and Physical  Norma [name] [id number] DOB: 1934-07-17    PCP:   [place]   Chief Complaint: Weakness.   HPI: Norma [name] is an 82 y.o. female with hx of CHF, CKD III, anemia of chronic disease, DM, prior CVA, came from home with complain of feeling weak, and unable to ambulate.  She was evaluated with CXR which showed no active disease, UA with TNTC WBCs and negative troponin, with Cr of 1.26.  She was given IV Rocephin, and as she was to be discharged, she felt to weak to ambulate, so hospitalist was asked to admit her for weakness and UTI.  She has no neurological deficit, no focal weakness, and no back pain.   Rewiew of Systems:  Constitutional: Negative for malaise, fever and chills. No significant weight loss or weight gain Eyes: Negative for eye pain, redness and discharge, diplopia, visual changes, or flashes of light. ENMT: Negative for ear pain, hoarseness, nasal congestion, sinus pressure and sore throat. No headaches; tinnitus, drooling, or problem swallowing. Cardiovascular: Negative for chest pain, palpitations, diaphoresis, dyspnea and peripheral edema. ; No orthopnea, PND Respiratory: Negative for cough, hemoptysis, wheezing and stridor. No pleuritic chestpain. Gastrointestinal: Negative for nausea, vomiting, diarrhea, constipation, abdominal pain, melena, blood in stool, hematemesis, jaundice and rectal bleeding.    Genitourinary: Negative for frequency, dysuria, incontinence,flank pain and hematuria; Musculoskeletal: Negative for back pain and neck pain. Negative for swelling and trauma.;  Skin: . Negative for pruritus, rash, abrasions, bruising and skin lesion.; ulcerations Neuro: Negative for headache, lightheadedness and neck stiffness. Negative for weakness, altered level of consciousness , altered mental status, extremity weakness, burning feet, involuntary movement, seizure and syncope.  Psych: negative for anxiety,  depression, insomnia, tearfulness, panic attacks, hallucinations, paranoia, suicidal or homicidal ideation   Past Medical History  Diagnosis Date  . Unspecified pleural effusion   . Acute systolic heart failure ([place])   . Other primary cardiomyopathies   . Congestive heart failure, unspecified   . Other specified forms of chronic ischemic heart disease   . Coronary atherosclerosis of native coronary artery   . Unspecified hypertensive kidney disease with chronic kidney disease stage I through stage IV, or unspecified   . Chronic kidney disease, unspecified ([place])   . Anemia of other chronic disease   . Other chronic pulmonary heart diseases   . Hypoxemia   . Chronic pain syndrome   . Pneumonia     "when I was little" (12/23/2012)  . Type II diabetes mellitus ([place])   . History of blood transfusion 1960's    "when my last child was born" (12/23/2012)  . Stroke [facility]) ~ 2011    "slowed my walking" (12/23/2012)    Past Surgical History  Procedure Laterality Date  . Cholecystectomy    . Appendectomy    . Abdominal hysterectomy    . Patella reconstruction Bilateral     "had my knee caps replaced" (12/23/2012)  . Cataract extraction, bilateral    . Tonsillectomy    . Dilation and curettage of uterus      Medications:  HOME MEDS: Prior to Admission medications   Medication Sig Start Date End Date Taking? Authorizing Provider  carvedilol (COREG) 3.125 MG tablet TAKE 1 TABLET BY MOUTH TWICE DAILY. PATIENT NEEDS TO BE SEEN. 08/08/13  Yes Satira Sark, MD  clonazePAM (KLONOPIN) 0.5 MG tablet Take 0.5 mg by mouth 3 (three) times daily as needed for anxiety.    Yes Historical Provider,  MD  clopidogrel (PLAVIX) 75 MG tablet Take 75 mg by mouth daily.   Yes Historical Provider, MD  FLUoxetine HCl 60 MG TABS Take 1 tablet by mouth daily. 02/18/16  Yes Historical Provider, MD  furosemide (LASIX) 40 MG tablet Take 40 mg by mouth daily as needed for fluid.  12/04/10  Yes Ezra Sites, MD   insulin detemir (LEVEMIR) 100 UNIT/ML injection Inject 14-25 Units into the skin at bedtime. Based on sugar levels   Yes Historical Provider, MD  lisinopril (PRINIVIL,ZESTRIL) 20 MG tablet Take 1 tablet by mouth daily. 02/06/16  Yes Historical Provider, MD  ondansetron (ZOFRAN) 8 MG tablet Take 8 mg by mouth every 8 (eight) hours as needed for nausea.    Yes Historical Provider, MD  oxyCODONE (ROXICODONE) 15 MG immediate release tablet Take 1 tablet by mouth every 4 (four) hours as needed for pain.  02/21/16  Yes Historical Provider, MD  pantoprazole (PROTONIX) 40 MG tablet Take 1 tablet by mouth daily. 02/11/16  Yes Historical Provider, MD  potassium chloride (K-DUR) 10 MEQ tablet Take 1 tablet by mouth daily.  02/18/16  Yes Historical Provider, MD  promethazine (PHENERGAN) 25 MG tablet Take 1 tablet by mouth every 4 (four) hours as needed. 02/19/16  Yes Historical Provider, MD  simvastatin (ZOCOR) 20 MG tablet Take 20 mg by mouth every evening.    Yes Historical Provider, MD     Allergies:  No Known Allergies  Social History:   reports that she has never smoked. She has never used smokeless tobacco. She reports that she does not drink alcohol or use illicit drugs.  Family History: Family History  Problem Relation Age of Onset  . Heart attack Father 49    MI  . Cancer Father     leukemia  . Cancer Brother   . Heart disease Daughter      Physical Exam: Filed Vitals:   02/26/16 1400 02/26/16 1430 02/26/16 1500 02/26/16 1530  BP: 150/74 131/62 142/76 153/71  Pulse: 67 69 71 72  Temp:      TempSrc:      Resp: 20 17 17    Height:      Weight:      SpO2: 93% 95% 95% 98%   Blood pressure 153/71, pulse 72, temperature 99.2 F (37.3 C), temperature source Rectal, resp. rate 17, height 5\' 2"  (1.575 m), weight 76.204 kg (168 lb), SpO2 98 %.  GEN:  Pleasant  patient lying in the stretcher in no acute distress; cooperative with exam. PSYCH:  alert and oriented x4; does not appear anxious or  depressed; affect is appropriate. HEENT: Mucous membranes pink and anicteric; PERRLA; EOM intact; no cervical lymphadenopathy nor thyromegaly or carotid bruit; no JVD; There were no stridor. Neck is very supple. Breasts:: Not examined CHEST WALL: No tenderness CHEST: Normal respiration, clear to auscultation bilaterally.  HEART: Regular rate and rhythm.  There are no murmur, rub, or gallops.   BACK: No kyphosis or scoliosis; no CVA tenderness ABDOMEN: soft and non-tender; no masses, no organomegaly, normal abdominal bowel sounds; no pannus; no intertriginous candida. There is no rebound and no distention. Rectal Exam: Not done EXTREMITIES: No bone or joint deformity; age-appropriate arthropathy of the hands and knees; no edema; no ulcerations.  There is no calf tenderness. Genitalia: not examined PULSES: 2+ and symmetric SKIN: Normal hydration no rash or ulceration CNS: Cranial nerves 2-12 grossly intact no focal lateralizing neurologic deficit.  Speech is fluent; uvula elevated with phonation, facial  symmetry and tongue midline. DTR are normal bilaterally, cerebella exam is intact, barbinski is negative and strengths are equaled bilaterally.  No sensory loss.   Labs on Admission:  Basic Metabolic Panel:  Recent Labs Lab 02/26/16 0940  NA 133*  K 3.6  CL 100*  CO2 26  GLUCOSE 252*  BUN 16  CREATININE 1.26*  CALCIUM 8.5*   Liver Function Tests:  Recent Labs Lab 02/26/16 0940  AST 13*  ALT 11*  ALKPHOS 99  BILITOT 0.9  PROT 6.7  ALBUMIN 3.4*   CBC:  Recent Labs Lab 02/26/16 0940  WBC 9.8  HGB 11.5*  HCT 35.4*  MCV 85.3  PLT 253   Cardiac Enzymes:  Recent Labs Lab 02/26/16 0945  TROPONINI 0.03    CBG:  Recent Labs Lab 02/26/16 0902  GLUCAP 245*     Radiological Exams on Admission: Dg Chest Port 1 View  02/26/2016  CLINICAL DATA:  Fever and weakness for 1 day EXAM: PORTABLE CHEST 1 VIEW COMPARISON:  09/27/2014 FINDINGS: The heart size and  mediastinal contours are within normal limits. Both lungs are clear. The visualized skeletal structures are unremarkable. IMPRESSION: No active disease. Electronically Signed   By: Inez Catalina M.D.   On: 02/26/2016 09:46   Assessment/Plan Present on Admission:  . UTI (lower urinary tract infection) . Chronic kidney disease (CKD), stage III (moderate) . Dilated cardiomyopathy ([place])  PLAN:  Will admit her for obs.  Continue with her meds.  Continue with IV Rocephin for her UTI.    If she feel better tomorrow, she would like to go home. If she cannot, please consult physical therapy.   DM:  Continue with insulin.  Implement SSI.  CKD:  Cr stable.  Will follow.    Other plans as per orders. Code Status:FULL CODE.    Orvan Falconer, MD. FACP [facility] Pager [phone] 7pm to 7am.  02/26/2016, 4:31 PM

## 2016-02-26 NOTE — ED Provider Notes (Signed)
CSN: [id number]     Arrival date & time 02/26/16  0848 History  By signing my name below, I, Gwenlyn Fudge, attest that this documentation has been prepared under the direction and in the presence of Elnora Morrison, MD. Electronically Signed: Gwenlyn Fudge, ED Scribe. 02/26/2016. 10:11 AM.   Chief Complaint  Patient presents with  . Weakness    Patient is a 82 y.o. female presenting with weakness. The history is provided by the patient. No language interpreter was used.  Weakness This is a new problem. The current episode started 3 to 5 hours ago. Associated symptoms include headaches. Pertinent negatives include no chest pain, no abdominal pain and no shortness of breath.    HPI Comments: Norma [name] is a 82 y.o. female with PMHx of HTN, DM, and stroke who presents to the Emergency Department complaining of sudden onset, constant, generalized weakness onset of this morning. Pt reports  associated vomiting, headache and nausea. Husband reports associated diaphoresis this morning.Pt denies sick contact or recent hospital stays. No recent changes in medication. Pt denies fever,cough, shortness of breath, vision changes, sore throat, abdominal pain, chills, frequency, diarrhea, chest pain.    Past Medical History  Diagnosis Date  . Unspecified pleural effusion   . Acute systolic heart failure ([place])   . Other primary cardiomyopathies   . Congestive heart failure, unspecified   . Other specified forms of chronic ischemic heart disease   . Coronary atherosclerosis of native coronary artery   . Unspecified hypertensive kidney disease with chronic kidney disease stage I through stage IV, or unspecified   . Chronic kidney disease, unspecified ([place])   . Anemia of other chronic disease   . Other chronic pulmonary heart diseases   . Hypoxemia   . Chronic pain syndrome   . Pneumonia     "when I was little" (12/23/2012)  . Type II diabetes mellitus ([place])   . History of blood transfusion 1960's     "when my last child was born" (12/23/2012)  . Stroke [facility]) ~ 2011    "slowed my walking" (12/23/2012)   Past Surgical History  Procedure Laterality Date  . Cholecystectomy    . Appendectomy    . Abdominal hysterectomy    . Patella reconstruction Bilateral     "had my knee caps replaced" (12/23/2012)  . Cataract extraction, bilateral    . Tonsillectomy    . Dilation and curettage of uterus     Family History  Problem Relation Age of Onset  . Heart attack Father 88    MI  . Cancer Father     leukemia  . Cancer Brother   . Heart disease Daughter    Social History  Substance Use Topics  . Smoking status: Never Smoker   . Smokeless tobacco: Never Used  . Alcohol Use: No   OB History    No data available     Review of Systems  Constitutional: Negative for fever and chills.  HENT: Negative for sore throat.   Eyes: Negative for visual disturbance.  Respiratory: Negative for cough and shortness of breath.   Cardiovascular: Negative for chest pain.  Gastrointestinal: Positive for nausea and vomiting. Negative for abdominal pain and diarrhea.  Genitourinary: Negative for frequency.  Neurological: Positive for weakness and headaches.  All other systems reviewed and are negative.   Allergies  Review of patient's allergies indicates no known allergies.  Home Medications   Prior to Admission medications   Medication Sig Start Date End Date  Taking? Authorizing Provider  carvedilol (COREG) 3.125 MG tablet TAKE 1 TABLET BY MOUTH TWICE DAILY. PATIENT NEEDS TO BE SEEN. 08/08/13  Yes Satira Sark, MD  clonazePAM (KLONOPIN) 0.5 MG tablet Take 0.5 mg by mouth 3 (three) times daily as needed for anxiety.    Yes Historical Provider, MD  clopidogrel (PLAVIX) 75 MG tablet Take 75 mg by mouth daily.   Yes Historical Provider, MD  FLUoxetine HCl 60 MG TABS Take 1 tablet by mouth daily. 02/18/16  Yes Historical Provider, MD  furosemide (LASIX) 40 MG tablet Take 40 mg by mouth daily as  needed for fluid.  12/04/10  Yes Ezra Sites, MD  insulin detemir (LEVEMIR) 100 UNIT/ML injection Inject 14-25 Units into the skin at bedtime. Based on sugar levels   Yes Historical Provider, MD  lisinopril (PRINIVIL,ZESTRIL) 20 MG tablet Take 1 tablet by mouth daily. 02/06/16  Yes Historical Provider, MD  ondansetron (ZOFRAN) 8 MG tablet Take 8 mg by mouth every 8 (eight) hours as needed for nausea.    Yes Historical Provider, MD  oxyCODONE (ROXICODONE) 15 MG immediate release tablet Take 1 tablet by mouth every 4 (four) hours as needed for pain.  02/21/16  Yes Historical Provider, MD  pantoprazole (PROTONIX) 40 MG tablet Take 1 tablet by mouth daily. 02/11/16  Yes Historical Provider, MD  potassium chloride (K-DUR) 10 MEQ tablet Take 1 tablet by mouth daily.  02/18/16  Yes Historical Provider, MD  promethazine (PHENERGAN) 25 MG tablet Take 1 tablet by mouth every 4 (four) hours as needed. 02/19/16  Yes Historical Provider, MD  simvastatin (ZOCOR) 20 MG tablet Take 20 mg by mouth every evening.    Yes Historical Provider, MD   BP 139/69 mmHg  Pulse 66  Temp(Src) 99.2 F (37.3 C) (Rectal)  Resp 18  Ht 5\' 2"  (1.575 m)  Wt 168 lb (76.204 kg)  BMI 30.72 kg/m2  SpO2 92% Physical Exam  Constitutional: She appears well-developed and well-nourished.  HENT:  Head: Normocephalic.  DMM  Eyes: Conjunctivae are normal. Pupils are equal, round, and reactive to light.  Neck: Neck supple.  Cardiovascular: Normal rate, regular rhythm and normal heart sounds.   Pulmonary/Chest: Effort normal and breath sounds normal. No respiratory distress. She has no wheezes. She has no rales.  Abdominal: Soft. She exhibits no distension. There is no tenderness.  Musculoskeletal: Normal range of motion.  Neurological: She is alert.  No arm drift Equal strength in extremitites Generally weak on exam Gross sensation intact to palpation in all extremities  Skin: Skin is warm and dry.  Psychiatric: She has a normal mood  and affect. Her behavior is normal.  Nursing note and vitals reviewed.   ED Course  Procedures (including critical care time) DIAGNOSTIC STUDIES: Oxygen Saturation is 94% on RA, adequate by my interpretation.    COORDINATION OF CARE: 9:36 AM Discussed treatment plan with pt at bedside which includes DG Chest, Troponin, CBC, and Urine culture and pt agreed to plan.  Labs Review Labs Reviewed  COMPREHENSIVE METABOLIC PANEL - Abnormal; Notable for the following:    Sodium 133 (*)    Chloride 100 (*)    Glucose, Bld 252 (*)    Creatinine, Ser 1.26 (*)    Calcium 8.5 (*)    Albumin 3.4 (*)    AST 13 (*)    ALT 11 (*)    GFR calc non Af Amer 39 (*)    GFR calc Af Amer 46 (*)  All other components within normal limits  CBC - Abnormal; Notable for the following:    Hemoglobin 11.5 (*)    HCT 35.4 (*)    All other components within normal limits  URINALYSIS, ROUTINE W REFLEX MICROSCOPIC (NOT AT [facility]) - Abnormal; Notable for the following:    APPearance CLOUDY (*)    Glucose, UA 500 (*)    Hgb urine dipstick LARGE (*)    Protein, ur 30 (*)    Nitrite POSITIVE (*)    Leukocytes, UA MODERATE (*)    All other components within normal limits  URINE MICROSCOPIC-ADD ON - Abnormal; Notable for the following:    Squamous Epithelial / LPF 0-5 (*)    Bacteria, UA MANY (*)    All other components within normal limits  CBG MONITORING, ED - Abnormal; Notable for the following:    Glucose-Capillary 245 (*)    All other components within normal limits  CULTURE, BLOOD (ROUTINE X 2)  CULTURE, BLOOD (ROUTINE X 2)  URINE CULTURE  TROPONIN I  I-STAT CG4 LACTIC ACID, ED  I-STAT CG4 LACTIC ACID, ED    Imaging Review Dg Chest Port 1 View  02/26/2016  CLINICAL DATA:  Fever and weakness for 1 day EXAM: PORTABLE CHEST 1 VIEW COMPARISON:  09/27/2014 FINDINGS: The heart size and mediastinal contours are within normal limits. Both lungs are clear. The visualized skeletal structures are  unremarkable. IMPRESSION: No active disease. Electronically Signed   By: Inez Catalina M.D.   On: 02/26/2016 09:46   I have personally reviewed and evaluated these images and lab results as part of my medical decision-making.   EKG Interpretation None     EKG reviewed heart rate 90, sinus, long PR, normal QT, no acute ST elevation MDM   Final diagnoses:  UTI (lower urinary tract infection)  General weakness   Patient presents with general weakness and vomiting. Patient has had mild urinary frequency. Screening blood work reviewed, hyperglycemia. Urinalysis consistent with infection. Patient has nonfocal neuro exam. With general weakness, age and difficulty ambulating due to general weakness plan for observation in the hospital.  Pt too weak to ambulate.   The patients results and plan were reviewed and discussed.   Any x-rays performed were independently reviewed by myself.   Differential diagnosis were considered with the presenting HPI.  Medications  0.9 %  sodium chloride infusion (1,000 mLs Intravenous New Bag/Given 02/26/16 0942)  cefTRIAXone (ROCEPHIN) 1 g in dextrose 5 % 50 mL IVPB (1 g Intravenous New Bag/Given 02/26/16 1305)  ondansetron (ZOFRAN) injection 4 mg (4 mg Intravenous Given 02/26/16 0904)  HYDROcodone-acetaminophen (NORCO/VICODIN) 5-325 MG per tablet 1 tablet (1 tablet Oral Given 02/26/16 1052)    Filed Vitals:   02/26/16 1130 02/26/16 1200 02/26/16 1230 02/26/16 1300  BP: 142/71 134/66 147/81 139/69  Pulse: 72 67 85 66  Temp:      TempSrc:      Resp: 18 16 19 18   Height:      Weight:      SpO2: 92% 89% 93% 92%    Final diagnoses:  UTI (lower urinary tract infection)  General weakness    Admission/ observation were discussed with the admitting physician, patient and/or family and they are comfortable with the plan.     Elnora Morrison, MD 02/26/16 1501

## 2016-02-27 DIAGNOSIS — N183 Chronic kidney disease, stage 3 (moderate): Secondary | ICD-10-CM | POA: Diagnosis not present

## 2016-02-27 DIAGNOSIS — N390 Urinary tract infection, site not specified: Secondary | ICD-10-CM | POA: Diagnosis not present

## 2016-02-27 LAB — BASIC METABOLIC PANEL
ANION GAP: 4 — AB (ref 5–15)
BUN: 15 mg/dL (ref 6–20)
CALCIUM: 8.3 mg/dL — AB (ref 8.9–10.3)
CO2: 25 mmol/L (ref 22–32)
CREATININE: 1.13 mg/dL — AB (ref 0.44–1.00)
Chloride: 107 mmol/L (ref 101–111)
GFR calc Af Amer: 52 mL/min — ABNORMAL LOW (ref >60)
GFR, EST NON AFRICAN AMERICAN: 45 mL/min — AB (ref >60)
GLUCOSE: 181 mg/dL — AB (ref 65–99)
Potassium: 3.5 mmol/L (ref 3.5–5.1)
Sodium: 136 mmol/L (ref 135–145)

## 2016-02-27 LAB — CBC
HCT: 31.1 % — ABNORMAL LOW (ref 36.0–46.0)
HEMOGLOBIN: 10.2 g/dL — AB (ref 12.0–15.0)
MCH: 27.9 pg (ref 26.0–34.0)
MCHC: 32.8 g/dL (ref 30.0–36.0)
MCV: 85 fL (ref 78.0–100.0)
PLATELETS: 216 10*3/uL (ref 150–400)
RBC: 3.66 MIL/uL — ABNORMAL LOW (ref 3.87–5.11)
RDW: 14.1 % (ref 11.5–15.5)
WBC: 5.8 10*3/uL (ref 4.0–10.5)

## 2016-02-27 LAB — GLUCOSE, CAPILLARY
GLUCOSE-CAPILLARY: 150 mg/dL — AB (ref 65–99)
GLUCOSE-CAPILLARY: 179 mg/dL — AB (ref 65–99)
Glucose-Capillary: 202 mg/dL — ABNORMAL HIGH (ref 65–99)

## 2016-02-27 MED ORDER — CIPROFLOXACIN HCL 250 MG PO TABS: 250.0000 mg | ORAL_TABLET | Freq: Two times a day (BID) | ORAL | Status: DC

## 2016-02-27 NOTE — Care Management Note (Signed)
Case Management Note  Patient Details  Name: Norma [name] MRN: [id number] Date of Birth: 09/30/1933  Subjective/Objective:                  Pt admitted with weakness r/t UTI. Pt is from home, lives with husband and grandson. Pt is ind with ADL's. Pt has cane and walker to use as needed. Pt has used [facility] in the past and would like them again if needed. Pt planning to DC home today with self care.   Action/Plan: No CM needs anticipated.   Expected Discharge Date:  02/28/16               Expected Discharge Plan:  Home/Self Care  In-House Referral:  NA  Discharge planning Services  CM Consult  Post Acute Care Choice:  NA Choice offered to:  NA  DME Arranged:    DME Agency:     HH Arranged:    HH Agency:     Status of Service:  Completed, signed off  Medicare Important Message Given:    Date Medicare IM Given:    Medicare IM give by:    Date Additional Medicare IM Given:    Additional Medicare Important Message give by:     If discussed at [place] of Stay Meetings, dates discussed:    Additional Comments:  Sherald Barge, RN 02/27/2016, 3:51 PM

## 2016-02-27 NOTE — Care Management Obs Status (Signed)
[place] NOTIFICATION   Patient Details  Name: [name] MRN: [id number] Date of Birth: March 22, 1934   Medicare Observation Status Notification Given:  Yes    Sherald Barge, RN 02/27/2016, 3:51 PM

## 2016-02-27 NOTE — Discharge Summary (Signed)
Physician Discharge Summary  Norma [name] [id number] DOB: 05-Jan-1934 DOA: 02/26/2016  PCP: [place] date: 02/26/2016 Discharge date: 02/27/2016  Time spent: 45 minutes  Recommendations for Outpatient Follow-up:  -Will be discharged home today. -Advised to follow-up with primary care provider in 2 weeks.   Discharge Diagnoses:  Principal Problem:   UTI (lower urinary tract infection) Active Problems:   Dilated cardiomyopathy (HCC)   Chronic kidney disease (CKD), stage III (moderate)   Weakness generalized   Weakness   DM2 (diabetes mellitus, type 2) ([place])   Discharge Condition: Stable and improved  Filed Weights   02/26/16 0854 02/26/16 1634  Weight: 76.204 kg (168 lb) 78.2 kg (172 lb 6.4 oz)    History of present illness:  As per Dr. Marin Comment on 6/13: Norma [name] is an 82 y.o. female with hx of CHF, CKD III, anemia of chronic disease, DM, prior CVA, came from home with complain of feeling weak, and unable to ambulate. She was evaluated with CXR which showed no active disease, UA with TNTC WBCs and negative troponin, with Cr of 1.26. She was given IV Rocephin, and as she was to be discharged, she felt to weak to ambulate, so hospitalist was asked to admit her for weakness and UTI. She has no neurological deficit, no focal weakness, and no back pain.   Hospital Course:   UTI -Culture pending at time of discharge. Will send home on 7 days of Cipro.  Insulin-dependent diabetes -Fair control, continue outpatient regimen.  Stage III chronic kidney disease -Creatinine has been at baseline.   Procedures:  None   Consultations:  None  Discharge Instructions  Discharge Instructions    Diet - low sodium heart healthy    Complete by:  As directed      Increase activity slowly    Complete by:  As directed             Medication List    TAKE these medications        carvedilol 3.125 MG tablet  Commonly known as:  COREG    TAKE 1 TABLET BY MOUTH TWICE DAILY. PATIENT NEEDS TO BE SEEN.     ciprofloxacin 250 MG tablet  Commonly known as:  CIPRO  Take 1 tablet (250 mg total) by mouth 2 (two) times daily.     clonazePAM 0.5 MG tablet  Commonly known as:  KLONOPIN  Take 0.5 mg by mouth 3 (three) times daily as needed for anxiety.     clopidogrel 75 MG tablet  Commonly known as:  PLAVIX  Take 75 mg by mouth daily.     FLUoxetine HCl 60 MG Tabs  Take 1 tablet by mouth daily.     furosemide 40 MG tablet  Commonly known as:  LASIX  Take 40 mg by mouth daily as needed for fluid.     insulin detemir 100 UNIT/ML injection  Commonly known as:  LEVEMIR  Inject 14-25 Units into the skin at bedtime. Based on sugar levels     lisinopril 20 MG tablet  Commonly known as:  PRINIVIL,ZESTRIL  Take 1 tablet by mouth daily.     ondansetron 8 MG tablet  Commonly known as:  ZOFRAN  Take 8 mg by mouth every 8 (eight) hours as needed for nausea.     oxyCODONE 15 MG immediate release tablet  Commonly known as:  ROXICODONE  Take 1 tablet by mouth every 4 (four) hours as needed for pain.  pantoprazole 40 MG tablet  Commonly known as:  PROTONIX  Take 1 tablet by mouth daily.     potassium chloride 10 MEQ tablet  Commonly known as:  K-DUR  Take 1 tablet by mouth daily.     promethazine 25 MG tablet  Commonly known as:  PHENERGAN  Take 1 tablet by mouth every 4 (four) hours as needed.     simvastatin 20 MG tablet  Commonly known as:  ZOCOR  Take 20 mg by mouth every evening.       No Known Allergies     Follow-up Information    Follow up with [facility]. Schedule an appointment as soon as possible for a visit in 2 weeks.   Contact information:   [place] [id number] [phone]        The results of significant diagnostics from this hospitalization (including imaging, microbiology, ancillary and laboratory) are listed below for reference.    Significant Diagnostic  Studies: Dg Chest Port 1 View  02/26/2016  CLINICAL DATA:  Fever and weakness for 1 day EXAM: PORTABLE CHEST 1 VIEW COMPARISON:  09/27/2014 FINDINGS: The heart size and mediastinal contours are within normal limits. Both lungs are clear. The visualized skeletal structures are unremarkable. IMPRESSION: No active disease. Electronically Signed   By: Inez Catalina M.D.   On: 02/26/2016 09:46    Microbiology: Recent Results (from the past 240 hour(s))  Blood Culture (routine x 2)     Status: None (Preliminary result)   Collection Time: 02/26/16  9:45 AM  Result Value Ref Range Status   Specimen Description BLOOD RIGHT ANTECUBITAL  Final   Special Requests BOTTLES DRAWN AEROBIC AND ANAEROBIC 6CC EACH  Final   Culture NO GROWTH 1 DAY  Final   Report Status PENDING  Incomplete  Blood Culture (routine x 2)     Status: None (Preliminary result)   Collection Time: 02/26/16  9:45 AM  Result Value Ref Range Status   Specimen Description BLOOD RIGHT HAND  Final   Special Requests BOTTLES DRAWN AEROBIC AND ANAEROBIC 6CC EACH  Final   Culture NO GROWTH 1 DAY  Final   Report Status PENDING  Incomplete  MRSA PCR Screening     Status: Abnormal   Collection Time: 02/26/16  6:30 PM  Result Value Ref Range Status   MRSA by PCR POSITIVE (A) NEGATIVE Final    Comment:        The GeneXpert MRSA Assay (FDA approved for NASAL specimens only), is one component of a comprehensive MRSA colonization surveillance program. It is not intended to diagnose MRSA infection nor to guide or monitor treatment for MRSA infections. RESULT CALLED TO, READ BACK BY AND VERIFIED WITH: THOMAS,C ON 02/26/16 AT 2155 BY LOY,C      Labs: Basic Metabolic Panel:  Recent Labs Lab 02/26/16 0940 02/27/16 0438  NA 133* 136  K 3.6 3.5  CL 100* 107  CO2 26 25  GLUCOSE 252* 181*  BUN 16 15  CREATININE 1.26* 1.13*  CALCIUM 8.5* 8.3*   Liver Function Tests:  Recent Labs Lab 02/26/16 0940  AST 13*  ALT 11*  ALKPHOS 99   BILITOT 0.9  PROT 6.7  ALBUMIN 3.4*   No results for input(s): LIPASE, AMYLASE in the last 168 hours. No results for input(s): AMMONIA in the last 168 hours. CBC:  Recent Labs Lab 02/26/16 0940 02/27/16 0438  WBC 9.8 5.8  HGB 11.5* 10.2*  HCT 35.4* 31.1*  MCV  85.3 85.0  PLT 253 216   Cardiac Enzymes:  Recent Labs Lab 02/26/16 0945  TROPONINI 0.03   BNP: BNP (last 3 results) No results for input(s): BNP in the last 8760 hours.  ProBNP (last 3 results) No results for input(s): PROBNP in the last 8760 hours.  CBG:  Recent Labs Lab 02/26/16 0902 02/26/16 1649 02/26/16 2123 02/27/16 0739  GLUCAP 245* 349* 186* 150*       Signed:  HERNANDEZ ACOSTA,Raife Lizer  [facility] Pager: [phone] 02/27/2016, 3:47 PM

## 2016-02-29 LAB — URINE CULTURE

## 2016-03-02 LAB — CULTURE, BLOOD (ROUTINE X 2)
CULTURE: NO GROWTH
Culture: NO GROWTH

## 2016-03-28 DIAGNOSIS — N3000 Acute cystitis without hematuria: Secondary | ICD-10-CM | POA: Diagnosis not present

## 2016-03-28 DIAGNOSIS — M19011 Primary osteoarthritis, right shoulder: Secondary | ICD-10-CM | POA: Diagnosis not present

## 2016-05-03 DIAGNOSIS — R51 Headache: Secondary | ICD-10-CM | POA: Diagnosis not present

## 2016-05-07 DIAGNOSIS — G319 Degenerative disease of nervous system, unspecified: Secondary | ICD-10-CM | POA: Diagnosis not present

## 2016-05-07 DIAGNOSIS — Z8673 Personal history of transient ischemic attack (TIA), and cerebral infarction without residual deficits: Secondary | ICD-10-CM | POA: Diagnosis not present

## 2016-05-07 DIAGNOSIS — R42 Dizziness and giddiness: Secondary | ICD-10-CM | POA: Diagnosis not present

## 2016-05-07 DIAGNOSIS — R51 Headache: Secondary | ICD-10-CM | POA: Diagnosis not present

## 2016-05-14 DIAGNOSIS — K219 Gastro-esophageal reflux disease without esophagitis: Secondary | ICD-10-CM | POA: Diagnosis not present

## 2016-05-14 DIAGNOSIS — F339 Major depressive disorder, recurrent, unspecified: Secondary | ICD-10-CM | POA: Diagnosis not present

## 2016-05-14 DIAGNOSIS — E119 Type 2 diabetes mellitus without complications: Secondary | ICD-10-CM | POA: Diagnosis not present

## 2016-05-14 DIAGNOSIS — I10 Essential (primary) hypertension: Secondary | ICD-10-CM | POA: Diagnosis not present

## 2016-05-14 DIAGNOSIS — G459 Transient cerebral ischemic attack, unspecified: Secondary | ICD-10-CM | POA: Diagnosis not present

## 2016-05-14 DIAGNOSIS — Z794 Long term (current) use of insulin: Secondary | ICD-10-CM | POA: Diagnosis not present

## 2016-05-14 DIAGNOSIS — M545 Low back pain: Secondary | ICD-10-CM | POA: Diagnosis not present

## 2016-05-14 DIAGNOSIS — M47897 Other spondylosis, lumbosacral region: Secondary | ICD-10-CM | POA: Diagnosis not present

## 2016-05-14 DIAGNOSIS — K117 Disturbances of salivary secretion: Secondary | ICD-10-CM | POA: Diagnosis not present

## 2016-05-14 DIAGNOSIS — E785 Hyperlipidemia, unspecified: Secondary | ICD-10-CM | POA: Diagnosis not present

## 2016-05-15 DIAGNOSIS — G451 Carotid artery syndrome (hemispheric): Secondary | ICD-10-CM | POA: Diagnosis not present

## 2016-05-16 DIAGNOSIS — S52532D Colles' fracture of left radius, subsequent encounter for closed fracture with routine healing: Secondary | ICD-10-CM | POA: Diagnosis not present

## 2016-05-16 DIAGNOSIS — I69351 Hemiplegia and hemiparesis following cerebral infarction affecting right dominant side: Secondary | ICD-10-CM | POA: Diagnosis not present

## 2016-05-16 DIAGNOSIS — G8929 Other chronic pain: Secondary | ICD-10-CM | POA: Diagnosis not present

## 2016-05-16 DIAGNOSIS — M545 Low back pain: Secondary | ICD-10-CM | POA: Diagnosis not present

## 2016-05-16 DIAGNOSIS — N183 Chronic kidney disease, stage 3 (moderate): Secondary | ICD-10-CM | POA: Diagnosis not present

## 2016-05-16 DIAGNOSIS — E1122 Type 2 diabetes mellitus with diabetic chronic kidney disease: Secondary | ICD-10-CM | POA: Diagnosis not present

## 2016-05-16 DIAGNOSIS — I351 Nonrheumatic aortic (valve) insufficiency: Secondary | ICD-10-CM | POA: Diagnosis not present

## 2016-05-16 DIAGNOSIS — I129 Hypertensive chronic kidney disease with stage 1 through stage 4 chronic kidney disease, or unspecified chronic kidney disease: Secondary | ICD-10-CM | POA: Diagnosis not present

## 2016-05-16 DIAGNOSIS — R1310 Dysphagia, unspecified: Secondary | ICD-10-CM | POA: Diagnosis not present

## 2016-05-19 DIAGNOSIS — G8929 Other chronic pain: Secondary | ICD-10-CM | POA: Diagnosis not present

## 2016-05-19 DIAGNOSIS — E1122 Type 2 diabetes mellitus with diabetic chronic kidney disease: Secondary | ICD-10-CM | POA: Diagnosis not present

## 2016-05-19 DIAGNOSIS — M545 Low back pain: Secondary | ICD-10-CM | POA: Diagnosis not present

## 2016-05-19 DIAGNOSIS — N183 Chronic kidney disease, stage 3 (moderate): Secondary | ICD-10-CM | POA: Diagnosis not present

## 2016-05-19 DIAGNOSIS — I129 Hypertensive chronic kidney disease with stage 1 through stage 4 chronic kidney disease, or unspecified chronic kidney disease: Secondary | ICD-10-CM | POA: Diagnosis not present

## 2016-05-19 DIAGNOSIS — R1310 Dysphagia, unspecified: Secondary | ICD-10-CM | POA: Diagnosis not present

## 2016-05-19 DIAGNOSIS — I351 Nonrheumatic aortic (valve) insufficiency: Secondary | ICD-10-CM | POA: Diagnosis not present

## 2016-05-19 DIAGNOSIS — I69351 Hemiplegia and hemiparesis following cerebral infarction affecting right dominant side: Secondary | ICD-10-CM | POA: Diagnosis not present

## 2016-05-19 DIAGNOSIS — S52532D Colles' fracture of left radius, subsequent encounter for closed fracture with routine healing: Secondary | ICD-10-CM | POA: Diagnosis not present

## 2016-05-20 DIAGNOSIS — M545 Low back pain: Secondary | ICD-10-CM | POA: Diagnosis not present

## 2016-05-20 DIAGNOSIS — R1310 Dysphagia, unspecified: Secondary | ICD-10-CM | POA: Diagnosis not present

## 2016-05-20 DIAGNOSIS — Z23 Encounter for immunization: Secondary | ICD-10-CM | POA: Diagnosis not present

## 2016-05-20 DIAGNOSIS — E1122 Type 2 diabetes mellitus with diabetic chronic kidney disease: Secondary | ICD-10-CM | POA: Diagnosis not present

## 2016-05-20 DIAGNOSIS — I69351 Hemiplegia and hemiparesis following cerebral infarction affecting right dominant side: Secondary | ICD-10-CM | POA: Diagnosis not present

## 2016-05-20 DIAGNOSIS — N183 Chronic kidney disease, stage 3 (moderate): Secondary | ICD-10-CM | POA: Diagnosis not present

## 2016-05-20 DIAGNOSIS — G8929 Other chronic pain: Secondary | ICD-10-CM | POA: Diagnosis not present

## 2016-05-20 DIAGNOSIS — I351 Nonrheumatic aortic (valve) insufficiency: Secondary | ICD-10-CM | POA: Diagnosis not present

## 2016-05-20 DIAGNOSIS — S52532D Colles' fracture of left radius, subsequent encounter for closed fracture with routine healing: Secondary | ICD-10-CM | POA: Diagnosis not present

## 2016-05-20 DIAGNOSIS — I129 Hypertensive chronic kidney disease with stage 1 through stage 4 chronic kidney disease, or unspecified chronic kidney disease: Secondary | ICD-10-CM | POA: Diagnosis not present

## 2016-05-22 DIAGNOSIS — M545 Low back pain: Secondary | ICD-10-CM | POA: Diagnosis not present

## 2016-05-22 DIAGNOSIS — I129 Hypertensive chronic kidney disease with stage 1 through stage 4 chronic kidney disease, or unspecified chronic kidney disease: Secondary | ICD-10-CM | POA: Diagnosis not present

## 2016-05-22 DIAGNOSIS — S52532D Colles' fracture of left radius, subsequent encounter for closed fracture with routine healing: Secondary | ICD-10-CM | POA: Diagnosis not present

## 2016-05-22 DIAGNOSIS — G8929 Other chronic pain: Secondary | ICD-10-CM | POA: Diagnosis not present

## 2016-05-22 DIAGNOSIS — I69351 Hemiplegia and hemiparesis following cerebral infarction affecting right dominant side: Secondary | ICD-10-CM | POA: Diagnosis not present

## 2016-05-22 DIAGNOSIS — N183 Chronic kidney disease, stage 3 (moderate): Secondary | ICD-10-CM | POA: Diagnosis not present

## 2016-05-22 DIAGNOSIS — E1122 Type 2 diabetes mellitus with diabetic chronic kidney disease: Secondary | ICD-10-CM | POA: Diagnosis not present

## 2016-05-22 DIAGNOSIS — R1310 Dysphagia, unspecified: Secondary | ICD-10-CM | POA: Diagnosis not present

## 2016-05-22 DIAGNOSIS — I351 Nonrheumatic aortic (valve) insufficiency: Secondary | ICD-10-CM | POA: Diagnosis not present

## 2016-05-23 DIAGNOSIS — S52532D Colles' fracture of left radius, subsequent encounter for closed fracture with routine healing: Secondary | ICD-10-CM | POA: Diagnosis not present

## 2016-05-23 DIAGNOSIS — I69351 Hemiplegia and hemiparesis following cerebral infarction affecting right dominant side: Secondary | ICD-10-CM | POA: Diagnosis not present

## 2016-05-23 DIAGNOSIS — M545 Low back pain: Secondary | ICD-10-CM | POA: Diagnosis not present

## 2016-05-23 DIAGNOSIS — E1122 Type 2 diabetes mellitus with diabetic chronic kidney disease: Secondary | ICD-10-CM | POA: Diagnosis not present

## 2016-05-23 DIAGNOSIS — R1310 Dysphagia, unspecified: Secondary | ICD-10-CM | POA: Diagnosis not present

## 2016-05-23 DIAGNOSIS — I351 Nonrheumatic aortic (valve) insufficiency: Secondary | ICD-10-CM | POA: Diagnosis not present

## 2016-05-23 DIAGNOSIS — G8929 Other chronic pain: Secondary | ICD-10-CM | POA: Diagnosis not present

## 2016-05-23 DIAGNOSIS — I129 Hypertensive chronic kidney disease with stage 1 through stage 4 chronic kidney disease, or unspecified chronic kidney disease: Secondary | ICD-10-CM | POA: Diagnosis not present

## 2016-05-23 DIAGNOSIS — N183 Chronic kidney disease, stage 3 (moderate): Secondary | ICD-10-CM | POA: Diagnosis not present

## 2016-05-26 DIAGNOSIS — M545 Low back pain: Secondary | ICD-10-CM | POA: Diagnosis not present

## 2016-05-26 DIAGNOSIS — I129 Hypertensive chronic kidney disease with stage 1 through stage 4 chronic kidney disease, or unspecified chronic kidney disease: Secondary | ICD-10-CM | POA: Diagnosis not present

## 2016-05-26 DIAGNOSIS — I69351 Hemiplegia and hemiparesis following cerebral infarction affecting right dominant side: Secondary | ICD-10-CM | POA: Diagnosis not present

## 2016-05-26 DIAGNOSIS — G8929 Other chronic pain: Secondary | ICD-10-CM | POA: Diagnosis not present

## 2016-05-26 DIAGNOSIS — S52532D Colles' fracture of left radius, subsequent encounter for closed fracture with routine healing: Secondary | ICD-10-CM | POA: Diagnosis not present

## 2016-05-26 DIAGNOSIS — R1310 Dysphagia, unspecified: Secondary | ICD-10-CM | POA: Diagnosis not present

## 2016-05-26 DIAGNOSIS — I351 Nonrheumatic aortic (valve) insufficiency: Secondary | ICD-10-CM | POA: Diagnosis not present

## 2016-05-26 DIAGNOSIS — E1122 Type 2 diabetes mellitus with diabetic chronic kidney disease: Secondary | ICD-10-CM | POA: Diagnosis not present

## 2016-05-26 DIAGNOSIS — N183 Chronic kidney disease, stage 3 (moderate): Secondary | ICD-10-CM | POA: Diagnosis not present

## 2016-05-27 DIAGNOSIS — I129 Hypertensive chronic kidney disease with stage 1 through stage 4 chronic kidney disease, or unspecified chronic kidney disease: Secondary | ICD-10-CM | POA: Diagnosis not present

## 2016-05-27 DIAGNOSIS — I69351 Hemiplegia and hemiparesis following cerebral infarction affecting right dominant side: Secondary | ICD-10-CM | POA: Diagnosis not present

## 2016-05-27 DIAGNOSIS — I351 Nonrheumatic aortic (valve) insufficiency: Secondary | ICD-10-CM | POA: Diagnosis not present

## 2016-05-27 DIAGNOSIS — N183 Chronic kidney disease, stage 3 (moderate): Secondary | ICD-10-CM | POA: Diagnosis not present

## 2016-05-27 DIAGNOSIS — R1310 Dysphagia, unspecified: Secondary | ICD-10-CM | POA: Diagnosis not present

## 2016-05-27 DIAGNOSIS — G8929 Other chronic pain: Secondary | ICD-10-CM | POA: Diagnosis not present

## 2016-05-27 DIAGNOSIS — E1122 Type 2 diabetes mellitus with diabetic chronic kidney disease: Secondary | ICD-10-CM | POA: Diagnosis not present

## 2016-05-27 DIAGNOSIS — S52532D Colles' fracture of left radius, subsequent encounter for closed fracture with routine healing: Secondary | ICD-10-CM | POA: Diagnosis not present

## 2016-05-27 DIAGNOSIS — M545 Low back pain: Secondary | ICD-10-CM | POA: Diagnosis not present

## 2016-05-30 DIAGNOSIS — R1310 Dysphagia, unspecified: Secondary | ICD-10-CM | POA: Diagnosis not present

## 2016-05-30 DIAGNOSIS — I351 Nonrheumatic aortic (valve) insufficiency: Secondary | ICD-10-CM | POA: Diagnosis not present

## 2016-05-30 DIAGNOSIS — N183 Chronic kidney disease, stage 3 (moderate): Secondary | ICD-10-CM | POA: Diagnosis not present

## 2016-05-30 DIAGNOSIS — G8929 Other chronic pain: Secondary | ICD-10-CM | POA: Diagnosis not present

## 2016-05-30 DIAGNOSIS — E1122 Type 2 diabetes mellitus with diabetic chronic kidney disease: Secondary | ICD-10-CM | POA: Diagnosis not present

## 2016-05-30 DIAGNOSIS — I129 Hypertensive chronic kidney disease with stage 1 through stage 4 chronic kidney disease, or unspecified chronic kidney disease: Secondary | ICD-10-CM | POA: Diagnosis not present

## 2016-05-30 DIAGNOSIS — S52532D Colles' fracture of left radius, subsequent encounter for closed fracture with routine healing: Secondary | ICD-10-CM | POA: Diagnosis not present

## 2016-05-30 DIAGNOSIS — M545 Low back pain: Secondary | ICD-10-CM | POA: Diagnosis not present

## 2016-05-30 DIAGNOSIS — I69351 Hemiplegia and hemiparesis following cerebral infarction affecting right dominant side: Secondary | ICD-10-CM | POA: Diagnosis not present

## 2016-06-02 DIAGNOSIS — S52532D Colles' fracture of left radius, subsequent encounter for closed fracture with routine healing: Secondary | ICD-10-CM | POA: Diagnosis not present

## 2016-06-02 DIAGNOSIS — I129 Hypertensive chronic kidney disease with stage 1 through stage 4 chronic kidney disease, or unspecified chronic kidney disease: Secondary | ICD-10-CM | POA: Diagnosis not present

## 2016-06-02 DIAGNOSIS — E1122 Type 2 diabetes mellitus with diabetic chronic kidney disease: Secondary | ICD-10-CM | POA: Diagnosis not present

## 2016-06-02 DIAGNOSIS — G8929 Other chronic pain: Secondary | ICD-10-CM | POA: Diagnosis not present

## 2016-06-02 DIAGNOSIS — N183 Chronic kidney disease, stage 3 (moderate): Secondary | ICD-10-CM | POA: Diagnosis not present

## 2016-06-02 DIAGNOSIS — R1310 Dysphagia, unspecified: Secondary | ICD-10-CM | POA: Diagnosis not present

## 2016-06-02 DIAGNOSIS — I351 Nonrheumatic aortic (valve) insufficiency: Secondary | ICD-10-CM | POA: Diagnosis not present

## 2016-06-02 DIAGNOSIS — I69351 Hemiplegia and hemiparesis following cerebral infarction affecting right dominant side: Secondary | ICD-10-CM | POA: Diagnosis not present

## 2016-06-02 DIAGNOSIS — M545 Low back pain: Secondary | ICD-10-CM | POA: Diagnosis not present

## 2016-06-03 DIAGNOSIS — E1122 Type 2 diabetes mellitus with diabetic chronic kidney disease: Secondary | ICD-10-CM | POA: Diagnosis not present

## 2016-06-03 DIAGNOSIS — S52532D Colles' fracture of left radius, subsequent encounter for closed fracture with routine healing: Secondary | ICD-10-CM | POA: Diagnosis not present

## 2016-06-03 DIAGNOSIS — N183 Chronic kidney disease, stage 3 (moderate): Secondary | ICD-10-CM | POA: Diagnosis not present

## 2016-06-03 DIAGNOSIS — R1310 Dysphagia, unspecified: Secondary | ICD-10-CM | POA: Diagnosis not present

## 2016-06-03 DIAGNOSIS — I351 Nonrheumatic aortic (valve) insufficiency: Secondary | ICD-10-CM | POA: Diagnosis not present

## 2016-06-03 DIAGNOSIS — M545 Low back pain: Secondary | ICD-10-CM | POA: Diagnosis not present

## 2016-06-03 DIAGNOSIS — I129 Hypertensive chronic kidney disease with stage 1 through stage 4 chronic kidney disease, or unspecified chronic kidney disease: Secondary | ICD-10-CM | POA: Diagnosis not present

## 2016-06-03 DIAGNOSIS — G8929 Other chronic pain: Secondary | ICD-10-CM | POA: Diagnosis not present

## 2016-06-03 DIAGNOSIS — I69351 Hemiplegia and hemiparesis following cerebral infarction affecting right dominant side: Secondary | ICD-10-CM | POA: Diagnosis not present

## 2016-06-06 DIAGNOSIS — I69351 Hemiplegia and hemiparesis following cerebral infarction affecting right dominant side: Secondary | ICD-10-CM | POA: Diagnosis not present

## 2016-06-06 DIAGNOSIS — N183 Chronic kidney disease, stage 3 (moderate): Secondary | ICD-10-CM | POA: Diagnosis not present

## 2016-06-06 DIAGNOSIS — I129 Hypertensive chronic kidney disease with stage 1 through stage 4 chronic kidney disease, or unspecified chronic kidney disease: Secondary | ICD-10-CM | POA: Diagnosis not present

## 2016-06-06 DIAGNOSIS — M545 Low back pain: Secondary | ICD-10-CM | POA: Diagnosis not present

## 2016-06-06 DIAGNOSIS — E1122 Type 2 diabetes mellitus with diabetic chronic kidney disease: Secondary | ICD-10-CM | POA: Diagnosis not present

## 2016-06-06 DIAGNOSIS — G8929 Other chronic pain: Secondary | ICD-10-CM | POA: Diagnosis not present

## 2016-06-06 DIAGNOSIS — S52532D Colles' fracture of left radius, subsequent encounter for closed fracture with routine healing: Secondary | ICD-10-CM | POA: Diagnosis not present

## 2016-06-06 DIAGNOSIS — I351 Nonrheumatic aortic (valve) insufficiency: Secondary | ICD-10-CM | POA: Diagnosis not present

## 2016-06-06 DIAGNOSIS — R1310 Dysphagia, unspecified: Secondary | ICD-10-CM | POA: Diagnosis not present

## 2016-06-09 DIAGNOSIS — Z79899 Other long term (current) drug therapy: Secondary | ICD-10-CM | POA: Diagnosis not present

## 2016-06-09 DIAGNOSIS — M25511 Pain in right shoulder: Secondary | ICD-10-CM | POA: Diagnosis not present

## 2016-06-09 DIAGNOSIS — M47816 Spondylosis without myelopathy or radiculopathy, lumbar region: Secondary | ICD-10-CM | POA: Diagnosis not present

## 2016-06-09 DIAGNOSIS — G8929 Other chronic pain: Secondary | ICD-10-CM | POA: Diagnosis not present

## 2016-06-10 DIAGNOSIS — N183 Chronic kidney disease, stage 3 (moderate): Secondary | ICD-10-CM | POA: Diagnosis not present

## 2016-06-10 DIAGNOSIS — E1122 Type 2 diabetes mellitus with diabetic chronic kidney disease: Secondary | ICD-10-CM | POA: Diagnosis not present

## 2016-06-10 DIAGNOSIS — M545 Low back pain: Secondary | ICD-10-CM | POA: Diagnosis not present

## 2016-06-10 DIAGNOSIS — I129 Hypertensive chronic kidney disease with stage 1 through stage 4 chronic kidney disease, or unspecified chronic kidney disease: Secondary | ICD-10-CM | POA: Diagnosis not present

## 2016-06-10 DIAGNOSIS — G8929 Other chronic pain: Secondary | ICD-10-CM | POA: Diagnosis not present

## 2016-06-10 DIAGNOSIS — S52532D Colles' fracture of left radius, subsequent encounter for closed fracture with routine healing: Secondary | ICD-10-CM | POA: Diagnosis not present

## 2016-06-10 DIAGNOSIS — R1310 Dysphagia, unspecified: Secondary | ICD-10-CM | POA: Diagnosis not present

## 2016-06-10 DIAGNOSIS — I351 Nonrheumatic aortic (valve) insufficiency: Secondary | ICD-10-CM | POA: Diagnosis not present

## 2016-06-10 DIAGNOSIS — I69351 Hemiplegia and hemiparesis following cerebral infarction affecting right dominant side: Secondary | ICD-10-CM | POA: Diagnosis not present

## 2016-06-11 DIAGNOSIS — I351 Nonrheumatic aortic (valve) insufficiency: Secondary | ICD-10-CM | POA: Diagnosis not present

## 2016-06-11 DIAGNOSIS — G8929 Other chronic pain: Secondary | ICD-10-CM | POA: Diagnosis not present

## 2016-06-11 DIAGNOSIS — I129 Hypertensive chronic kidney disease with stage 1 through stage 4 chronic kidney disease, or unspecified chronic kidney disease: Secondary | ICD-10-CM | POA: Diagnosis not present

## 2016-06-11 DIAGNOSIS — M545 Low back pain: Secondary | ICD-10-CM | POA: Diagnosis not present

## 2016-06-11 DIAGNOSIS — N183 Chronic kidney disease, stage 3 (moderate): Secondary | ICD-10-CM | POA: Diagnosis not present

## 2016-06-11 DIAGNOSIS — I69351 Hemiplegia and hemiparesis following cerebral infarction affecting right dominant side: Secondary | ICD-10-CM | POA: Diagnosis not present

## 2016-06-11 DIAGNOSIS — R1310 Dysphagia, unspecified: Secondary | ICD-10-CM | POA: Diagnosis not present

## 2016-06-11 DIAGNOSIS — E1122 Type 2 diabetes mellitus with diabetic chronic kidney disease: Secondary | ICD-10-CM | POA: Diagnosis not present

## 2016-06-11 DIAGNOSIS — S52532D Colles' fracture of left radius, subsequent encounter for closed fracture with routine healing: Secondary | ICD-10-CM | POA: Diagnosis not present

## 2016-06-13 DIAGNOSIS — M545 Low back pain: Secondary | ICD-10-CM | POA: Diagnosis not present

## 2016-06-13 DIAGNOSIS — G8929 Other chronic pain: Secondary | ICD-10-CM | POA: Diagnosis not present

## 2016-06-13 DIAGNOSIS — N183 Chronic kidney disease, stage 3 (moderate): Secondary | ICD-10-CM | POA: Diagnosis not present

## 2016-06-13 DIAGNOSIS — I351 Nonrheumatic aortic (valve) insufficiency: Secondary | ICD-10-CM | POA: Diagnosis not present

## 2016-06-13 DIAGNOSIS — I129 Hypertensive chronic kidney disease with stage 1 through stage 4 chronic kidney disease, or unspecified chronic kidney disease: Secondary | ICD-10-CM | POA: Diagnosis not present

## 2016-06-13 DIAGNOSIS — R1310 Dysphagia, unspecified: Secondary | ICD-10-CM | POA: Diagnosis not present

## 2016-06-13 DIAGNOSIS — E1122 Type 2 diabetes mellitus with diabetic chronic kidney disease: Secondary | ICD-10-CM | POA: Diagnosis not present

## 2016-06-13 DIAGNOSIS — S52532D Colles' fracture of left radius, subsequent encounter for closed fracture with routine healing: Secondary | ICD-10-CM | POA: Diagnosis not present

## 2016-06-13 DIAGNOSIS — I69351 Hemiplegia and hemiparesis following cerebral infarction affecting right dominant side: Secondary | ICD-10-CM | POA: Diagnosis not present

## 2016-06-14 DIAGNOSIS — Z6831 Body mass index (BMI) 31.0-31.9, adult: Secondary | ICD-10-CM | POA: Diagnosis not present

## 2016-06-14 DIAGNOSIS — I69351 Hemiplegia and hemiparesis following cerebral infarction affecting right dominant side: Secondary | ICD-10-CM | POA: Diagnosis not present

## 2016-06-18 DIAGNOSIS — I351 Nonrheumatic aortic (valve) insufficiency: Secondary | ICD-10-CM | POA: Diagnosis not present

## 2016-06-18 DIAGNOSIS — G8929 Other chronic pain: Secondary | ICD-10-CM | POA: Diagnosis not present

## 2016-06-18 DIAGNOSIS — S52532D Colles' fracture of left radius, subsequent encounter for closed fracture with routine healing: Secondary | ICD-10-CM | POA: Diagnosis not present

## 2016-06-18 DIAGNOSIS — E1122 Type 2 diabetes mellitus with diabetic chronic kidney disease: Secondary | ICD-10-CM | POA: Diagnosis not present

## 2016-06-18 DIAGNOSIS — M545 Low back pain: Secondary | ICD-10-CM | POA: Diagnosis not present

## 2016-06-18 DIAGNOSIS — I129 Hypertensive chronic kidney disease with stage 1 through stage 4 chronic kidney disease, or unspecified chronic kidney disease: Secondary | ICD-10-CM | POA: Diagnosis not present

## 2016-06-18 DIAGNOSIS — N183 Chronic kidney disease, stage 3 (moderate): Secondary | ICD-10-CM | POA: Diagnosis not present

## 2016-06-18 DIAGNOSIS — I69351 Hemiplegia and hemiparesis following cerebral infarction affecting right dominant side: Secondary | ICD-10-CM | POA: Diagnosis not present

## 2016-06-18 DIAGNOSIS — R1310 Dysphagia, unspecified: Secondary | ICD-10-CM | POA: Diagnosis not present

## 2016-06-19 DIAGNOSIS — G301 Alzheimer's disease with late onset: Secondary | ICD-10-CM | POA: Diagnosis not present

## 2016-06-19 DIAGNOSIS — E1122 Type 2 diabetes mellitus with diabetic chronic kidney disease: Secondary | ICD-10-CM | POA: Diagnosis not present

## 2016-06-19 DIAGNOSIS — E876 Hypokalemia: Secondary | ICD-10-CM | POA: Diagnosis not present

## 2016-06-19 DIAGNOSIS — E782 Mixed hyperlipidemia: Secondary | ICD-10-CM | POA: Diagnosis not present

## 2016-06-19 DIAGNOSIS — K210 Gastro-esophageal reflux disease with esophagitis: Secondary | ICD-10-CM | POA: Diagnosis not present

## 2016-06-19 DIAGNOSIS — I10 Essential (primary) hypertension: Secondary | ICD-10-CM | POA: Diagnosis not present

## 2016-06-20 DIAGNOSIS — G301 Alzheimer's disease with late onset: Secondary | ICD-10-CM | POA: Diagnosis not present

## 2016-06-20 DIAGNOSIS — K210 Gastro-esophageal reflux disease with esophagitis: Secondary | ICD-10-CM | POA: Diagnosis not present

## 2016-06-20 DIAGNOSIS — E876 Hypokalemia: Secondary | ICD-10-CM | POA: Diagnosis not present

## 2016-06-20 DIAGNOSIS — E1122 Type 2 diabetes mellitus with diabetic chronic kidney disease: Secondary | ICD-10-CM | POA: Diagnosis not present

## 2016-06-20 DIAGNOSIS — I10 Essential (primary) hypertension: Secondary | ICD-10-CM | POA: Diagnosis not present

## 2016-06-20 DIAGNOSIS — E782 Mixed hyperlipidemia: Secondary | ICD-10-CM | POA: Diagnosis not present

## 2016-06-23 DIAGNOSIS — I69351 Hemiplegia and hemiparesis following cerebral infarction affecting right dominant side: Secondary | ICD-10-CM | POA: Diagnosis not present

## 2016-06-23 DIAGNOSIS — E782 Mixed hyperlipidemia: Secondary | ICD-10-CM | POA: Diagnosis not present

## 2016-06-23 DIAGNOSIS — F331 Major depressive disorder, recurrent, moderate: Secondary | ICD-10-CM | POA: Diagnosis not present

## 2016-06-23 DIAGNOSIS — M25511 Pain in right shoulder: Secondary | ICD-10-CM | POA: Diagnosis not present

## 2016-06-23 DIAGNOSIS — N183 Chronic kidney disease, stage 3 (moderate): Secondary | ICD-10-CM | POA: Diagnosis not present

## 2016-06-23 DIAGNOSIS — E1122 Type 2 diabetes mellitus with diabetic chronic kidney disease: Secondary | ICD-10-CM | POA: Diagnosis not present

## 2016-06-23 DIAGNOSIS — G301 Alzheimer's disease with late onset: Secondary | ICD-10-CM | POA: Diagnosis not present

## 2016-06-23 DIAGNOSIS — M19011 Primary osteoarthritis, right shoulder: Secondary | ICD-10-CM | POA: Diagnosis not present

## 2016-06-23 DIAGNOSIS — Z6832 Body mass index (BMI) 32.0-32.9, adult: Secondary | ICD-10-CM | POA: Diagnosis not present

## 2016-06-23 DIAGNOSIS — J301 Allergic rhinitis due to pollen: Secondary | ICD-10-CM | POA: Diagnosis not present

## 2016-07-02 DIAGNOSIS — M47816 Spondylosis without myelopathy or radiculopathy, lumbar region: Secondary | ICD-10-CM | POA: Diagnosis not present

## 2016-07-02 DIAGNOSIS — M25511 Pain in right shoulder: Secondary | ICD-10-CM | POA: Diagnosis not present

## 2016-07-02 DIAGNOSIS — F329 Major depressive disorder, single episode, unspecified: Secondary | ICD-10-CM | POA: Diagnosis not present

## 2016-07-02 DIAGNOSIS — G8929 Other chronic pain: Secondary | ICD-10-CM | POA: Diagnosis not present

## 2016-07-10 DIAGNOSIS — I6789 Other cerebrovascular disease: Secondary | ICD-10-CM | POA: Diagnosis not present

## 2016-07-10 DIAGNOSIS — I69351 Hemiplegia and hemiparesis following cerebral infarction affecting right dominant side: Secondary | ICD-10-CM | POA: Diagnosis not present

## 2016-07-21 DIAGNOSIS — M19011 Primary osteoarthritis, right shoulder: Secondary | ICD-10-CM | POA: Diagnosis not present

## 2016-07-21 DIAGNOSIS — G8929 Other chronic pain: Secondary | ICD-10-CM | POA: Diagnosis not present

## 2016-07-21 DIAGNOSIS — M25511 Pain in right shoulder: Secondary | ICD-10-CM | POA: Diagnosis not present

## 2016-07-31 DIAGNOSIS — M25511 Pain in right shoulder: Secondary | ICD-10-CM | POA: Diagnosis not present

## 2016-08-10 DIAGNOSIS — I6789 Other cerebrovascular disease: Secondary | ICD-10-CM | POA: Diagnosis not present

## 2016-08-10 DIAGNOSIS — I69351 Hemiplegia and hemiparesis following cerebral infarction affecting right dominant side: Secondary | ICD-10-CM | POA: Diagnosis not present

## 2016-08-13 DIAGNOSIS — J0100 Acute maxillary sinusitis, unspecified: Secondary | ICD-10-CM | POA: Diagnosis not present

## 2016-08-13 DIAGNOSIS — Z6831 Body mass index (BMI) 31.0-31.9, adult: Secondary | ICD-10-CM | POA: Diagnosis not present

## 2016-09-09 DIAGNOSIS — I69351 Hemiplegia and hemiparesis following cerebral infarction affecting right dominant side: Secondary | ICD-10-CM | POA: Diagnosis not present

## 2016-09-09 DIAGNOSIS — I6789 Other cerebrovascular disease: Secondary | ICD-10-CM | POA: Diagnosis not present

## 2017-01-20 ENCOUNTER — Other Ambulatory Visit: Payer: Self-pay | Admitting: *Deleted

## 2017-01-20 NOTE — Patient Outreach (Signed)
[facility]) Care Management  01/20/2017  LIZBET CIRRINCIONE September 10, 1934 [id number]  Telephone Screen  Referral Date: 01/20/17 Referral Source: [facility] Referral Reason: Disease management of DM and medications Insurance:[employer]  Outreach attempt #1 to patient. No answer. RN CM left HIPAA compliant message along with contact info.    Plan: RN CM will make outreach attempt to patient within three business days.  Lake Bells, RN, BSN, MHA/MSL, [place] Telephonic Care Manager Coordinator [facility] Direct Phone: [phone] Toll Free: [phone] Fax: [phone]

## 2017-01-23 ENCOUNTER — Encounter: Payer: Self-pay | Admitting: *Deleted

## 2017-01-23 ENCOUNTER — Other Ambulatory Visit: Payer: Self-pay | Admitting: *Deleted

## 2017-01-23 ENCOUNTER — Other Ambulatory Visit: Payer: Self-pay | Admitting: Licensed Clinical Social Worker

## 2017-01-23 NOTE — Patient Outreach (Addendum)
Norma [name]) Care Management  01/23/2017  Norma [name] 1933-11-23 [id number]  Telephone Screen  Referral Date: 01/20/17 Referral Source: [facility] Referral Reason: Disease management of DM and medications Insurance:[employer]   Outreach attempt # 2 spoke with patient and her daughter [name]) about the referral from [place]. HIPAA verified with daughter.  Social:  Patient lives with husband and grandson. She is dependent with her ADLs. Her daughter stated, she is the caregiver for her mother. Patient's daughter transports her to all medical appointments. Patient uses a walker and a wheelchair to assist with mobility, per daughter.   Conditions: Past Medical Hx: AFib, HTN, CHF, DM, CVA, Depression, HLD Daughter reported, patient is having complications controlling her blood glucose. She stated, the mother BG ranges 350 to 600's during the night. On 01/19/17, the MD made changes to patient's insulin medication, Levemir 48 units in the morning. The daughter reported on 01/22/17, the BG results were 400. The BG range is within normal limits; when checked in the mornings. The daughter reported, the patient has night sweats, frequently. Per the daughter, the patient "snacks a lot throughout the day". Patient's depression score is 18. Patient's depression is related to limited mobility, dependent upon others, and being housebound. Daughter wants to prevent complications of elevated BG levels.  Medications:  Daughter reported, patient takes 10 medications per day. Daughter verbalized, patient being able to afford her medications and taking them as prescribed. Her daughter manages her mother's medications.   Appointments: Patient's last appointment with her PCP was on 01/19/17. Her next appointment is scheduled on 04/23/17.  Advanced Directives: [name] reported, patient doesn't have an AD. Daughter declined to receive any information regarding  AD.  Consent: [facility] services reviewed and discussed with patient. Verbal consent for services given.   Plan: RN CM will send referral to [facility] RN for further in home eval/assessment of care needs and management of chronic conditions. RN CM advised patient that [facility] community RN CM would follow up and contact patient within the next 10 days. RN CM will send [facility] SW referral for possible assistance with depression (score 18). RN CM will send [facility] pharmacy referral for polypharmacy. RN CM advised patient to contact RN CM for any needs or concerns. RN CM advised patient to alert MD for any changes in conditions.  RN CM provided patient with [facility] 24hr Nurse Line contact info.   Lake Bells, RN, BSN, MHA/MSL, [place] Telephonic Care Manager Coordinator [facility] Direct Phone: [phone] Toll Free: [phone] Fax: [phone]

## 2017-01-23 NOTE — Patient Outreach (Signed)
Assessment:  CSW [employer] transferred Marcheta Grammes to CSW Raynaldo Opitz on 01/23/17.  Norva Riffle.Leobardo Granlund MSW, LCSW Licensed Clinical Social Worker [facility] Care Management [phone]

## 2017-01-23 NOTE — Telephone Encounter (Signed)
This encounter was created in error - please disregard.

## 2017-01-27 ENCOUNTER — Other Ambulatory Visit: Payer: Self-pay | Admitting: *Deleted

## 2017-01-27 NOTE — Patient Outreach (Signed)
Telephone call to schedule initial home visit, spoke with husband Gwyndolyn Saxon who oversees appointments, etc and home visit scheduled for 02/10/17.  Jacqlyn Larsen [facility] [place] Coordinator [phone]

## 2017-02-02 ENCOUNTER — Other Ambulatory Visit: Payer: Self-pay | Admitting: *Deleted

## 2017-02-02 ENCOUNTER — Encounter: Payer: Self-pay | Admitting: *Deleted

## 2017-02-02 NOTE — Patient Outreach (Signed)
[facility]) Care Management  [place]  02/02/2017   STEFFIE WAGGONER [birth year] [id number]   Encounter Medications:  Outpatient Encounter Prescriptions as of 02/02/2017  Medication Sig  . clonazePAM (KLONOPIN) 0.5 MG tablet Take 0.5 mg by mouth 3 (three) times daily as needed for anxiety.   . carvedilol (COREG) 3.125 MG tablet TAKE 1 TABLET BY MOUTH TWICE DAILY. PATIENT NEEDS TO BE SEEN.  . ciprofloxacin (CIPRO) 250 MG tablet Take 1 tablet (250 mg total) by mouth 2 (two) times daily.  . clopidogrel (PLAVIX) 75 MG tablet Take 75 mg by mouth daily.  Marland Kitchen FLUoxetine HCl 60 MG TABS Take 1 tablet by mouth daily.  . furosemide (LASIX) 40 MG tablet Take 40 mg by mouth daily as needed for fluid.   Marland Kitchen insulin detemir (LEVEMIR) 100 UNIT/ML injection Inject 14-25 Units into the skin at bedtime. Based on sugar levels  . lisinopril (PRINIVIL,ZESTRIL) 20 MG tablet Take 1 tablet by mouth daily.  . ondansetron (ZOFRAN) 8 MG tablet Take 8 mg by mouth every 8 (eight) hours as needed for nausea.   Marland Kitchen oxyCODONE (ROXICODONE) 15 MG immediate release tablet Take 1 tablet by mouth every 4 (four) hours as needed for pain.   . pantoprazole (PROTONIX) 40 MG tablet Take 1 tablet by mouth daily.  . potassium chloride (K-DUR) 10 MEQ tablet Take 1 tablet by mouth daily.   . promethazine (PHENERGAN) 25 MG tablet Take 1 tablet by mouth every 4 (four) hours as needed.  . simvastatin (ZOCOR) 20 MG tablet Take 20 mg by mouth every evening.    No facility-administered encounter medications on file as of 02/02/2017.     Functional Status:  In your present state of health, do you have any difficulty performing the following activities: 02/02/2017 02/26/2016  Hearing? N N  Vision? Y N  Difficulty concentrating or making decisions? N N  Walking or climbing stairs? Y Y  Dressing or bathing? Y Y  Doing errands, shopping? Y N  Some recent data might be hidden    Fall/Depression Screening: Fall Risk   02/02/2017 01/23/2017  Falls in the past year? Yes Yes  Number falls in past yr: 1 2 or more  Injury with Fall? No No  Risk Factor Category  High Fall Risk High Fall Risk  Risk for fall due to : Impaired mobility;History of fall(s) History of fall(s);Impaired balance/gait;Impaired mobility  Follow up Falls prevention discussed Falls prevention discussed   PHQ 2/9 Scores 02/02/2017 01/23/2017  PHQ - 2 Score 6 5  PHQ- 9 Score 9 18    Assessment:  CSW met with patient, daughter - [name] & husband Gwyndolyn Saxon at patient's home for initial home visit. CSW had received referral from [place] for assistance with depression. CSW completed initial intake assessment, had patient sign consent and reviewed options for patient that CSW could assist with for depression. Patient lives with her husband, daughter & grandson at her home in [place], patient spends much of her day in her room though states she does go out for doctors appointments and occasionally, the grocery store. Patient reports though that after coming back home from going out that she is "wiped out" and sleeps the rest of the day. Patient's daughter reports that she has been taking Fluoxetine '60mg'$  QD since stroke 8 years ago and at her last PCP appointment on 01/19/17, was started on Duloxetine HCL DR (Cymbalta) '60mg'$ . Her next PCP appointment is in August. Patient and daughter  are hopeful that the addition of Cymbalta will help. Patient attributes her depression to her limited mobility, having to be dependent on others and being essentially housebound. CSW explained to patient & daughter that CSW could arrange a telephonic counselor through her insurance - [employer] though patient declined stating that she doesn't like to talk on the phone. CSW asked patient if she would be willing to go to a psychiatrist/psychologist for counseling and get out of the house but patient declined that option as well stating that it is "too much to get out of the  house". Patient's husband & daughter appear to be very supportive & involved in her care.   CSW also spoke with patient about advance directives though patient declined stating that her daughter & husband would be there to make decisions for her and know what her wishes are.   Plan:   [facility] CM Care Plan Problem One     Most Recent Value  Care Plan Problem One  Patient has depression  Role Documenting the Problem One  Clinical Social Worker  Care Plan for Problem One  Active  [name] (31-90 days)  Patient will have score of lower than 18 on the depression scale within the next 45 days  [name] Start Date  02/02/17  Interventions for Problem One Long Term Goal  CSW encouraged patient to come out of her room daily, if even just for meals. CSW provided information on telephonic counseling, which patient declined at this time.       CSW will follow-up with patient in home within the next 3 weeks to help with resources/options for depression.   Raynaldo Opitz, LCSW [facility]  Clinical Social Worker cell #: [phone]

## 2017-02-05 ENCOUNTER — Other Ambulatory Visit: Payer: Self-pay | Admitting: Pharmacist

## 2017-02-05 NOTE — Patient Outreach (Addendum)
[facility]) Care Management  [place]   02/05/2017  Norma [name] 01-05-1934 [id number]  Late entry for 02/05/17.  Subjective:  Patient was referred to [place] by [place], for medication review due to patient taking >10 medications.    Successful phone outreach to patient's daughter, [name], who is on [facility] Consent form, and patient's HIPAA details were verified.  Patient's daughter reported patient was sleeping at time of call but that daughter manages patient's medications.  Explained purpose of call was to review patient's medications and offered a home visit, daughter felt she could go over medications over the phone instead of home visit.    Patient's daughter reports she checks patient's blood glucose every morning and evening, with morning readings typically 80's-120 and evening readings 400 and up.    Patient's daughter reports patient does not have medication affordability issues.   Objective:   Current Medications: Current Outpatient Prescriptions  Medication Sig Dispense Refill  . carvedilol (COREG) 3.125 MG tablet TAKE 1 TABLET BY MOUTH TWICE DAILY. PATIENT NEEDS TO BE SEEN. 60 tablet 3  . ciprofloxacin (CIPRO) 250 MG tablet Take 1 tablet (250 mg total) by mouth 2 (two) times daily. 14 tablet 0  . clonazePAM (KLONOPIN) 0.5 MG tablet Take 0.5 mg by mouth 3 (three) times daily as needed for anxiety.     . clopidogrel (PLAVIX) 75 MG tablet Take 75 mg by mouth daily.    Marland Kitchen FLUoxetine HCl 60 MG TABS Take 1 tablet by mouth daily.    . furosemide (LASIX) 40 MG tablet Take 40 mg by mouth daily as needed for fluid.     Marland Kitchen insulin detemir (LEVEMIR) 100 UNIT/ML injection Inject 14-25 Units into the skin at bedtime. Based on sugar levels    . lisinopril (PRINIVIL,ZESTRIL) 20 MG tablet Take 1 tablet by mouth daily.    . ondansetron (ZOFRAN) 8 MG tablet Take 8 mg by mouth every 8 (eight) hours as needed for nausea.     Marland Kitchen oxyCODONE  (ROXICODONE) 15 MG immediate release tablet Take 1 tablet by mouth every 4 (four) hours as needed for pain.     . pantoprazole (PROTONIX) 40 MG tablet Take 1 tablet by mouth daily.    . potassium chloride (K-DUR) 10 MEQ tablet Take 1 tablet by mouth daily.     . promethazine (PHENERGAN) 25 MG tablet Take 1 tablet by mouth every 4 (four) hours as needed.    . simvastatin (ZOCOR) 20 MG tablet Take 20 mg by mouth every evening.      No current facility-administered medications for this visit.     Functional Status: In your present state of health, do you have any difficulty performing the following activities: 02/02/2017 02/26/2016  Hearing? N N  Vision? Y N  Difficulty concentrating or making decisions? N N  Walking or climbing stairs? Y Y  Dressing or bathing? Y Y  Doing errands, shopping? Y N  Some recent data might be hidden    Fall/Depression Screening: Fall Risk  02/02/2017 01/23/2017  Falls in the past year? Yes Yes  Number falls in past yr: 1 2 or more  Injury with Fall? No No  Risk Factor Category  High Fall Risk High Fall Risk  Risk for fall due to : Impaired mobility;History of fall(s) History of fall(s);Impaired balance/gait;Impaired mobility  Follow up Falls prevention discussed Falls prevention discussed   PHQ 2/9 Scores 02/02/2017 01/23/2017  PHQ - 2 Score 6 5  PHQ- 9 Score 9 18    Assessment:  Medication review per patient's daughter report:   Drugs sorted by system:  Neurologic/Psychologic: -clonazepam---as needed per daughter, patient may take about once per week -fluoxetine  -duloxetine 60 mg---per daughter---medication not on medication list in this chart   Cardiovascular: -carvedilol---daughter reports dose is 12.5 mg twice daily---medication list in this chart shows 3.125 mg twice daily  -clopidogrel -furosemide 20 mg as needed---medication list in this chart shows 40 mg daily as needed -lisinopril  -potassium chloride -simvastatin    Gastrointestinal: -pantoprazole  -promethazine as needed   Endocrine: -Levemir---per daughter sliding scale---blood glucose 140-240---10 units, blood glucose 240-340---20 units, blood glucose >340---40 units---daughter reports this is morning and evening.   Pain: -oxycodone/acetaminophen 10/325 mg four time daily---medication not on medication list in this chart  Miscellaneous: -meclizine 25 mg three times daily as needed per daughter---medication not on medication list in this chart   Duplication in therapy: ---Patient on SSRI (fluoxetine) and SNRI (duloxetine)   Medications to avoid in the elderly:  ---promethazine---increased risk of dizziness, drowsiness and falls.    Other issues noted:  ---Blood glucose---recommend reviewing patient's blood glucose readings.  Levemir is not typically dosed as sliding scale---would suggest consideration be given to a scheduled daily dose of Levemir with dose titration.    ---Recommend monitoring for patient for constipation given she is on chronic narcotic medication.    ---Increased risk of CNS depression with concomitant clonazepam and oxycodone use.    Plan:  Will route this note to PCP.   Will place follow-up call to patient/her daughter in the next 2 weeks.   Karrie Meres, PharmD, [place] [phone]

## 2017-02-10 ENCOUNTER — Ambulatory Visit: Payer: Self-pay | Admitting: *Deleted

## 2017-02-12 ENCOUNTER — Encounter: Payer: Self-pay | Admitting: *Deleted

## 2017-02-12 ENCOUNTER — Other Ambulatory Visit: Payer: Self-pay | Admitting: *Deleted

## 2017-02-12 NOTE — Patient Outreach (Signed)
[facility]) Care Management   02/12/2017  Norma [name] [birth year] [id number]  Norma [name] is an 83 y.o. female  Subjective: Initial home visit with pt, HIPAA verified, daughter Norma [name] (who is CAP aide for 8 hours daily and lives with pt) present and pt husband Norma [name].  Pt states she has depression and does not feel like doing anything, reports she drinks sugary soft drinks all throughout the day and eats whatever she wants to including cookies and all kinds of sweets and states she is most likely going to continue eating this way.  Pt daughter assists with all aspects of pt care, pt has difficulty ambulating and uses walker. Pt and daughter feel pt does not have any issues related to CHF and agree diabetes is the main problem for pt.  Daughter states she thinks a wheelchair ramp may be beneficial to assist getting her mother out of the house.  Objective:   Vitals:   02/12/17 1236  BP: 128/62  Pulse: 69  Resp: 18  SpO2: 96%  Weight: 167 lb (75.8 kg)  Height: 1.575 m (5\' 2" )  CBG 7 day average 256 30 day average 251 60 day average 251 90 day average 255  ROS  Physical Exam  Constitutional: She is oriented to person, place, and time. She appears well-developed and well-nourished.  HENT:  Right Ear: External ear normal.  Neck: Normal range of motion. Neck supple.  Cardiovascular: Normal rate.   Respiratory: Effort normal and breath sounds normal.  GI: Soft. Bowel sounds are normal.  Musculoskeletal: Normal range of motion. She exhibits no edema.  Neurological: She is alert and oriented to person, place, and time.  Forgetful at times  Skin: Skin is warm and dry.  Psychiatric: She has a normal mood and affect. Her behavior is normal. Thought content normal.  depression    Encounter Medications:   Outpatient Encounter Prescriptions as of 02/12/2017  Medication Sig Note  . carvedilol (COREG) 12.5 MG tablet Take 12.5 mg by mouth 2 (two) times daily with a  meal. 02/11/2017: Per patient's daughter dose was increased to this.    . clonazePAM (KLONOPIN) 0.5 MG tablet Take 0.5 mg by mouth 3 (three) times daily as needed for anxiety.    . clopidogrel (PLAVIX) 75 MG tablet Take 75 mg by mouth daily.   . DULoxetine (CYMBALTA) 60 MG capsule Take 60 mg by mouth daily.   . furosemide (LASIX) 40 MG tablet Take 40 mg by mouth daily as needed for fluid.    Marland Kitchen insulin detemir (LEVEMIR) 100 UNIT/ML injection Inject 10-40 Units into the skin at bedtime. Based on sugar levels   . lisinopril (PRINIVIL,ZESTRIL) 20 MG tablet Take 1 tablet by mouth daily.   . meclizine (ANTIVERT) 25 MG tablet Take 25 mg by mouth 3 (three) times daily as needed for dizziness.   Marland Kitchen oxyCODONE-acetaminophen (PERCOCET) 10-325 MG tablet Take 1 tablet by mouth 4 (four) times daily. 02/11/2017: Per patient's daughter uses medication as scheduled.    . pantoprazole (PROTONIX) 40 MG tablet Take 1 tablet by mouth daily.   . potassium chloride (K-DUR) 10 MEQ tablet Take 1 tablet by mouth daily.    . promethazine (PHENERGAN) 25 MG tablet Take 1 tablet by mouth every 4 (four) hours as needed.   . simvastatin (ZOCOR) 20 MG tablet Take 20 mg by mouth every evening.    . [DISCONTINUED] FLUoxetine HCl 60 MG TABS Take 1 tablet by mouth daily.    No facility-administered  encounter medications on file as of 02/12/2017.     Functional Status:   In your present state of health, do you have any difficulty performing the following activities: 02/12/2017 02/02/2017  Hearing? - N  Vision? - Y  Difficulty concentrating or making decisions? - N  Walking or climbing stairs? - Y  Dressing or bathing? - Y  Doing errands, shopping? - Y  Preparing Food and eating ? Y -  Using the Toilet? N -  In the past six months, have you accidently leaked urine? Y -  Do you have problems with loss of bowel control? N -  Managing your Medications? Y -  Managing your Finances? Y -  Housekeeping or managing your Housekeeping? Y -   Some recent data might be hidden    Fall/Depression Screening:    Fall Risk  02/02/2017 01/23/2017  Falls in the past year? Yes Yes  Number falls in past yr: 1 2 or more  Injury with Fall? No No  Risk Factor Category  High Fall Risk High Fall Risk  Risk for fall due to : Impaired mobility;History of fall(s) History of fall(s);Impaired balance/gait;Impaired mobility  Follow up Falls prevention discussed Falls prevention discussed   PHQ 2/9 Scores 02/02/2017 01/23/2017  PHQ - 2 Score 6 5  PHQ- 9 Score 9 18    Assessment: Pt verbalizes she is not motivated or interested in changing dietary habits, daughter reports pt eats a lot of sweets, drinks sugary beverages daily.  Daughter Norma [name] will obtain some diet sodas for pt to try as pt refuses to drink water or unsweetened tea.  RN CM ask daughter to help pt with limiting concentrated sweets and reviewed alternative food choices.  RN CM ask daughter to make appointment with podiatrist to have toenails trimmed (pt used to see podiatrist).  Daughter prefills med box, RN CM observed medication bottles and reviewed with pt and daughter.  RN CM sent in basket to [place] CSW for resources for wheelchair ramp.  RN CM faxed initial home visit and barrier letter to primary MD Dr. Quillian Quince.  [name]     Most Recent Value  Care Plan Problem One  Knowledge deficit related to diabetes  Role Documenting the Problem One  Care Management Coordinator  Care Plan for Problem One  Active  [name]   pt will have reduction in Hgb AIC by 1-2 points within 90 days  [name] Start Date  02/12/17  Interventions for Problem One Long Term Goal  RN CM provided DM folder, carb counting poster and copy of plate method , EMMI handouts and reviewed with pt and daughter  [name]   pt will verbalize foods high in carbohydrates to limit and will choose diet drinks, water instead of soft drinks with sugar within 30  days.  [name] Start Date  02/12/17  Interventions for Short Term Goal #1  RN CM reviewed carbohydrate counting from poster, reviewed plate method in depth, pt not willing to give up soft drinks, RN CM ask pt to try 2 different types of diet sodas, ask pt to limit consumption of concentrated sweets      Plan: follow up with home visit in 3 weeks  Jacqlyn Larsen [facility], [place] [phone]

## 2017-02-17 ENCOUNTER — Ambulatory Visit: Payer: Self-pay | Admitting: Pharmacist

## 2017-02-17 ENCOUNTER — Other Ambulatory Visit: Payer: Self-pay | Admitting: *Deleted

## 2017-02-17 NOTE — Patient Outreach (Signed)
[facility]) Care Management  02/17/2017  Norma [name] 1933/12/22 [id number]   CSW called & spoke with patient's daughter, [name] [phone]) to follow-up on initial home visit. Daughter reports that she has been making sure that her mother comes out of her room at least once a day and that it would be easier to get her out of the house if she could get a ramp rather than trying to get her down the 4 rickety steps on their porch. CSW called Waymond Cera at [facility] to make sure patient is on their wait list as they won't get funding for ramps until July. CSW made patient's daughter, [name] aware. CSW will follow-up in a month to discuss community resources & see where she is on the list for the ramp.    Raynaldo Opitz, LCSW [facility]  Clinical Social Worker cell #: [phone]

## 2017-02-19 ENCOUNTER — Ambulatory Visit: Payer: Self-pay | Admitting: Pharmacist

## 2017-02-20 ENCOUNTER — Ambulatory Visit: Payer: Self-pay | Admitting: Pharmacist

## 2017-02-23 ENCOUNTER — Other Ambulatory Visit: Payer: Self-pay | Admitting: Pharmacist

## 2017-02-23 NOTE — Patient Outreach (Signed)
[facility]) Care Management  02/23/2017  Norma [name] 11/08/1933 [id number]  Attempted to follow-up with patient's daughter on [facility] Consent form. Patient's daughter who manages patient's medication per her report was not available.  HIPAA compliant message left.   Plan:  Will make another outreach attempt to patient's daughter.  Karrie Meres, PharmD, [place] [phone]

## 2017-02-25 ENCOUNTER — Other Ambulatory Visit: Payer: Self-pay | Admitting: Pharmacist

## 2017-02-25 NOTE — Patient Outreach (Signed)
[facility]) Care Management  02/25/2017  NEVA RAMASWAMY [birth year] [id number]  Follow-up phone call to patient's daughter, on [facility] Consent, who reports she manages patient's medications.   Reviewed [employer] note in chart, and noted patient reports drinking sugary drinks and eating sweets throughout the day.  Daughter confirms this and reports patient most likely won't change her diet much.    Daughter reports she continues to check patient's blood sugar twice daily, morning and evening and that she is using Levemir sliding scale per MD orders, but patient usually doesn't get insulin in the morning due to blood sugar <140 in morning.    Discussed with patient's daughter, it may be difficult to control patient's blood sugars given patient's diet.  Daughter report patient doesn't see MD until 04/2017.  She reports not currently writing down blood sugars but reports she was given [facility] note book by [employer] RN and was encouraged to write down patient's blood sugar readings in it to show MD.   Daughter denies questions/concerns with patient medication regimen at this time.    Plan:   Call placed to PCP office, Dr Dub Amis, to follow-up on diabetes medication management goals for referral, message left for [facility], nurse health advisor.    Will follow-up with patient's daughter in the next  2 weeks and will follow-up with PCP office if no return call by next week.   Karrie Meres, PharmD, [place] [phone]

## 2017-02-26 ENCOUNTER — Other Ambulatory Visit: Payer: Self-pay | Admitting: Pharmacist

## 2017-02-26 NOTE — Patient Outreach (Addendum)
[facility]) Care Management  02/26/2017  Norma [name] 1934-04-28 [id number]  Call received from [place], nurse health advisor with Dr Dub Amis.  Discussed concerns about patient's diabetes management given patient reported to [place] Almyra Free, she eats and drinks a lot of sweets during the day.  Discussed patient's daughter confirmed this.   Discussed patient's daughter reports presently giving patient Levemir as sliding scale, and Norma [name] reports this does not reflect what Dr Arcola Jansky chart indicates.  Courtney reports office did receive faxed note from [facility] Pharmacist medication review with patient's daughter last month, as patient's daughter reports managing patient's medications.   Discussed patient's daughter was encouraged to write down patient's blood sugar readings to show to PCP.   Plan:  Norma [name] plans to discuss insulin with PCP and reports she will call back once it is clarified with patient/patient's daughter and Dr Quillian Quince.   Karrie Meres, PharmD, [place] [phone]  Addendum:  [phone]:   Received a call back from [facility], nurse health advisor with Dr Dub Amis.  She reports Dr Dub Amis had intended for patient to take Levemir 40 units at night and titrate up based on blood sugars.  Norma [name] reports at this time she has contacted patient/patient's daughter for patient to take Levemir 40 units at night and call PCP office if fasting blood sugar >130 in the morning per Dr Quillian Quince.    Courtney reports [facility] Pharmacist does not need to contact patient with this information as PCP office has already contacted patient/daughter.

## 2017-03-05 ENCOUNTER — Other Ambulatory Visit: Payer: Self-pay | Admitting: *Deleted

## 2017-03-05 NOTE — Patient Outreach (Signed)
Telephone call with patient's daughter [name] who reports pt is to see podiatrist today and will not be home for scheduled visit.  RN rescheduled home visit for next week.  Jacqlyn Larsen [facility], [place] Coordinator [phone]

## 2017-03-10 ENCOUNTER — Encounter: Payer: Self-pay | Admitting: Pharmacist

## 2017-03-10 ENCOUNTER — Other Ambulatory Visit: Payer: Self-pay | Admitting: Pharmacist

## 2017-03-10 NOTE — Patient Outreach (Signed)
[place] [facility]) Care Management  03/10/2017  Norma [name] January 03, 1934 [id number]  Successful phone follow-up to patient's daughter, Norma [name], on [facility] Consent form, HIPAA details verified.    Patient's daughter reports MD office contacted them and instructed patient to use Levemir 40 units at night and call MD office if morning blood glucose above 130.  She reports patient's blood glucose has not exceeded 130 in the morning yet.    Patient's daughter also reports MD discontinued fluoxetine.  She reports MD continued duloxetine.     Patient's daughter denies pharmacy related concerns/questions at this time.   She reports patient continues to eat and drink sugary foods/beverages.  Discussed this will make controlling patient's blood sugars much more difficult---daughter reports she doesn't believe patient will change her dietary habits.   Plan:  Will close patient's pharmacy case as daughter denies other pharmacy related questions/concerns.  Will send MD a case closure letter to make MD aware.  Will update [facility] RN Almyra Free.    Karrie Meres, PharmD, [place] [phone]

## 2017-03-12 ENCOUNTER — Encounter: Payer: Self-pay | Admitting: *Deleted

## 2017-03-12 ENCOUNTER — Other Ambulatory Visit: Payer: Self-pay | Admitting: *Deleted

## 2017-03-12 NOTE — Patient Outreach (Addendum)
[place] [facility]) Care Management   03/12/2017  MARENDA ACCARDI 12/25/1933 [id number]  TERITA HEJL is an 83 y.o. female  Subjective: Routine visit with pt, HIPAA verified, daughter [name] (CAP aide) present and reports pt did go to podiatrist and had toenails trimmed, pt is getting wheelchair ramp built through her CAP budget.  Daughter states checking CBG BID but still not recording, reports change in levemir to 40 units daily and to call primary care MD if fasting CBG over 130.  Reports most fasting CBGs have been below 130 and most evening CBG readings 300 or over.  Pt still eating a lot of carbohydrates, sugary soft drinks.  Objective:   Vitals:   03/12/17 1254  BP: 130/60  Pulse: 63  Resp: 16  SpO2: 96%  7 day average 239 90 day average 248  ROS  Physical Exam  Constitutional: She is oriented to person, place, and time. She appears well-developed and well-nourished.  HENT:  Head: Normocephalic.  Neck: Normal range of motion. Neck supple.  Cardiovascular: Normal rate and regular rhythm.   Respiratory: Effort normal and breath sounds normal.  GI: Soft. Bowel sounds are normal.  Musculoskeletal: Normal range of motion. She exhibits no edema.  Neurological: She is alert and oriented to person, place, and time.  Skin: Skin is warm and dry.  Psychiatric: Her behavior is normal. Thought content normal.  Flat affect    Encounter Medications:   Outpatient Encounter Prescriptions as of 03/12/2017  Medication Sig Note  . carvedilol (COREG) 12.5 MG tablet Take 12.5 mg by mouth 2 (two) times daily with a meal. 02/11/2017: Per patient's daughter dose was increased to this.    . clonazePAM (KLONOPIN) 0.5 MG tablet Take 0.5 mg by mouth 3 (three) times daily as needed for anxiety.    . clopidogrel (PLAVIX) 75 MG tablet Take 75 mg by mouth daily.   . DULoxetine (CYMBALTA) 60 MG capsule Take 60 mg by mouth 2 (two) times daily.    . furosemide (LASIX) 40 MG tablet Take 40  mg by mouth daily as needed for fluid.    Marland Kitchen insulin detemir (LEVEMIR) 100 UNIT/ML injection Inject 40 Units into the skin at bedtime. Based on sugar levels   . lisinopril (PRINIVIL,ZESTRIL) 20 MG tablet Take 1 tablet by mouth daily.   . meclizine (ANTIVERT) 25 MG tablet Take 25 mg by mouth 3 (three) times daily as needed for dizziness.   Marland Kitchen oxyCODONE-acetaminophen (PERCOCET) 10-325 MG tablet Take 1 tablet by mouth 4 (four) times daily. 02/11/2017: Per patient's daughter uses medication as scheduled.    . pantoprazole (PROTONIX) 40 MG tablet Take 1 tablet by mouth daily.   . potassium chloride (K-DUR) 10 MEQ tablet Take 1 tablet by mouth daily.    . promethazine (PHENERGAN) 25 MG tablet Take 1 tablet by mouth every 4 (four) hours as needed.   . simvastatin (ZOCOR) 20 MG tablet Take 20 mg by mouth every evening.     No facility-administered encounter medications on file as of 03/12/2017.     Functional Status:   In your present state of health, do you have any difficulty performing the following activities: 02/12/2017 02/02/2017  Hearing? - N  Vision? - Y  Difficulty concentrating or making decisions? - N  Walking or climbing stairs? - Y  Dressing or bathing? - Y  Doing errands, shopping? - Y  Preparing Food and eating ? Y -  Using the Toilet? N -  In the past six  months, have you accidently leaked urine? Y -  Do you have problems with loss of bowel control? N -  Managing your Medications? Y -  Managing your Finances? Y -  Housekeeping or managing your Housekeeping? Y -  Some recent data might be hidden    Fall/Depression Screening:    Fall Risk  02/02/2017 01/23/2017  Falls in the past year? Yes Yes  Number falls in past yr: 1 2 or more  Injury with Fall? No No  Risk Factor Category  High Fall Risk High Fall Risk  Risk for fall due to : Impaired mobility;History of fall(s) History of fall(s);Impaired balance/gait;Impaired mobility  Follow up Falls prevention discussed Falls prevention  discussed   PHQ 2/9 Scores 02/02/2017 01/23/2017  PHQ - 2 Score 6 5  PHQ- 9 Score 9 18    Assessment:  RN CM encouraged daughter [name] to record CBG readings as she has not been doing this. RN CM reviewed medications and update med profile.  RN CM reviewed carbohydrate modified diet and encouraged pt to decrease consumption of sugary soft drinks.  [name]     Most Recent Value  Care Plan Problem One  Knowledge deficit related to diabetes  Role Documenting the Problem One  Care Management Coordinator  Care Plan for Problem One  Active  [name]   pt will have reduction in Hgb AIC by 1-2 points within 90 days  [name] Start Date  02/12/17  Interventions for Problem One Long Term Goal  RN CM reviewed importance of limiting sugary soft drinks and other concentrated sweets  [name]   pt will verbalize foods high in carbohydrates to limit and will choose diet drinks, water instead of soft drinks with sugar within 30 days.  [name] Start Date  02/12/17  Interventions for Short Term Goal #1  RN CM reinforced carbohydrate counting from poster, reviewed plate method in depth, pt still not willing to give up soft drinks and has not tried diet drinks or other alternatives such as unsweetened tea.      Plan: see for home visit next month Plan to discharge if pt not willing/ ready to make any changes  Jacqlyn Larsen [facility], [place] Coordinator [phone]

## 2017-03-24 ENCOUNTER — Other Ambulatory Visit: Payer: Self-pay | Admitting: *Deleted

## 2017-03-24 NOTE — Patient Outreach (Signed)
[facility] ([facility]) Care Management  03/24/2017  Aanchal S Sparks 03/16/1934 [id number]   CSW will perform a case closure on patient, as all goals of treatment have been met from social work standpoint and no additional social work needs have been identified at this time. CSW spoke with patient's daughter, [name] (ph#: [phone]) to confirm that patient still declines referral for counseling whether in person or telephonic. CSW also informed patient's daughter that CSW confirmed with Kathy at [employer] [facility] [employer] [facility] that patient is at the top of the waiting list for a ramp and that they were scheduled for funding this month. Patient's daughter agreed that there no further CSW needs identified. CSW will notify patient's RNCM, Julie with [facility] of CSW's plans to close patient's case.     , LCSW [facility]  Clinical Social Worker cell #: [phone] 

## 2017-04-07 ENCOUNTER — Other Ambulatory Visit: Payer: Self-pay | Admitting: *Deleted

## 2017-04-07 ENCOUNTER — Encounter: Payer: Self-pay | Admitting: *Deleted

## 2017-04-07 NOTE — Patient Outreach (Signed)
[place] [facility]) Care Management   04/07/2017  AVI ARCHULETA 1933-11-15 [id number]  Norma [name] is an 83 y.o. female  Subjective: Routine home visit with pt, HIPAA verified, daughter [name] present,  Reports pt went to ED on 7/3 with confusion, lethargy and diagnosed with urinary tract infection, has now finished antibiotics and asymptomatic at this time.  Pt states has not been recording CBG and some improvement in blood sugars with fasting ranges mostly under 100, random ranges 200's, pt to have bloodwork on 8/6 and see primary MD on 04/23/17.    Objective:   Vitals:   04/07/17 2312  BP: 118/64  Pulse: 60  Resp: 18  SpO2: 98%  fasting CBG today 95  ROS  Physical Exam  Constitutional: She is oriented to person, place, and time. She appears well-developed and well-nourished.  HENT:  Head: Normocephalic.  Neck: Normal range of motion. Neck supple.  Cardiovascular: Normal rate.   Respiratory: Effort normal and breath sounds normal.  GI: Soft. Bowel sounds are normal.  Musculoskeletal: Normal range of motion. She exhibits no edema.  Neurological: She is alert and oriented to person, place, and time.  Skin:  Both feet cold to touch  Psychiatric: She has a normal mood and affect. Her behavior is normal. Thought content normal.    Encounter Medications:   Outpatient Encounter Prescriptions as of 04/07/2017  Medication Sig Note  . carvedilol (COREG) 12.5 MG tablet Take 12.5 mg by mouth 2 (two) times daily with a meal. 02/11/2017: Per patient's daughter dose was increased to this.    . clonazePAM (KLONOPIN) 0.5 MG tablet Take 0.5 mg by mouth 3 (three) times daily as needed for anxiety.    . clopidogrel (PLAVIX) 75 MG tablet Take 75 mg by mouth daily.   . DULoxetine (CYMBALTA) 60 MG capsule Take 60 mg by mouth 2 (two) times daily.    . furosemide (LASIX) 40 MG tablet Take 40 mg by mouth daily as needed for fluid.    Marland Kitchen insulin detemir (LEVEMIR) 100 UNIT/ML injection  Inject 40 Units into the skin at bedtime. Based on sugar levels   . lisinopril (PRINIVIL,ZESTRIL) 20 MG tablet Take 1 tablet by mouth daily.   . meclizine (ANTIVERT) 25 MG tablet Take 25 mg by mouth 3 (three) times daily as needed for dizziness.   Marland Kitchen oxyCODONE-acetaminophen (PERCOCET) 10-325 MG tablet Take 1 tablet by mouth 4 (four) times daily. 02/11/2017: Per patient's daughter uses medication as scheduled.    . pantoprazole (PROTONIX) 40 MG tablet Take 1 tablet by mouth daily.   . potassium chloride (K-DUR) 10 MEQ tablet Take 1 tablet by mouth daily.    . promethazine (PHENERGAN) 25 MG tablet Take 1 tablet by mouth every 4 (four) hours as needed.   . simvastatin (ZOCOR) 20 MG tablet Take 20 mg by mouth every evening.     No facility-administered encounter medications on file as of 04/07/2017.     Functional Status:   In your present state of health, do you have any difficulty performing the following activities: 02/12/2017 02/02/2017  Hearing? - N  Vision? - Y  Difficulty concentrating or making decisions? - N  Walking or climbing stairs? - Y  Dressing or bathing? - Y  Doing errands, shopping? - Y  Preparing Food and eating ? Y -  Using the Toilet? N -  In the past six months, have you accidently leaked urine? Y -  Do you have problems with loss of bowel control?  N -  Managing your Medications? Y -  Managing your Finances? Y -  Housekeeping or managing your Housekeeping? Y -  Some recent data might be hidden    Fall/Depression Screening:    Fall Risk  04/07/2017 02/02/2017 01/23/2017  Falls in the past year? Yes Yes Yes  Number falls in past yr: 2 or more 1 2 or more  Injury with Fall? No No No  Risk Factor Category  High Fall Risk High Fall Risk High Fall Risk  Risk for fall due to : History of fall(s);Medication side effect Impaired mobility;History of fall(s) History of fall(s);Impaired balance/gait;Impaired mobility  Follow up Education provided;Falls evaluation completed;Falls  prevention discussed Falls prevention discussed Falls prevention discussed   PHQ 2/9 Scores 02/02/2017 01/23/2017  PHQ - 2 Score 6 5  PHQ- 9 Score 9 18    Assessment:  Pt and daughter feel blood sugar readings are better although there is still no log, pt to have Hgb AIC checked at MD appointment in August per daughter.  RN CM ask pt to walk throughout house daily,  Pt still does not leave home much, reviewed safety precautions, discussed discharge plan with pt and daughter and will call pt after MD visit, get update on lab work and any changes made.  [name]     Most Recent Value  Care Plan Problem One  Knowledge deficit related to diabetes  Role Documenting the Problem One  Care Management Coordinator  Care Plan for Problem One  Active  [name]   pt will have reduction in Hgb AIC by 1-2 points within 90 days  [name] Start Date  02/12/17  Interventions for Problem One Long Term Goal  RN CM reiterated importance of limiting sugary soft drinks and other concentrated sweets  [name]   pt will verbalize foods high in carbohydrates to limit and will choose diet drinks, water instead of soft drinks with sugar within 30 days.  [name] Start Date  04/07/17 [goal restarted- needs reinforcement]  Interventions for Short Term Goal #1  RN CM reiterated carbohydrate counting, plate method, limiting sugary soft drinks      Plan: call pt for telephonic assessment week of 04/27/17 after pt sees primary MD on 04/23/17 Ask about lab work and Hgb AIC  Jacqlyn Larsen [facility], [place] Coordinator [phone]

## 2017-04-27 ENCOUNTER — Other Ambulatory Visit: Payer: Self-pay | Admitting: *Deleted

## 2017-04-27 NOTE — Patient Outreach (Signed)
Telephone call to patient for telephone assessment, no answer to home phone [phone] and no option to leave voicemail, no answer to cell phone [phone], left voicemail requesting return phone call.  PLAN Outreach pt next week for telephone assessment Follow up MD appointment, Hgb AIC Assess CBG  Norma [name] [facility], [place] Coordinator [phone]

## 2017-05-04 ENCOUNTER — Other Ambulatory Visit: Payer: Self-pay | Admitting: *Deleted

## 2017-05-04 NOTE — Patient Outreach (Signed)
Telephone call to pt for telephone assessment, no answer to home phone or daughter's cell phone, no answer to either phone and no option to leave voicemail.  PLAN Outreach pt next week  Jacqlyn Larsen [facility], [place] Coordinator [phone]

## 2017-05-11 ENCOUNTER — Other Ambulatory Visit: Payer: Self-pay | Admitting: *Deleted

## 2017-05-11 NOTE — Patient Outreach (Signed)
Telephone call to patient for assessment/ follow up, spoke with patient's daughter [name] who reports Hgb AIC came down to 9.6, CBG today 69 and "readings are better, highest around 300, things better since taking 40 units at hs"  [name] states at last MD appointment pt had gained 11 pounds and pt does not weigh at home, [name] states pt has had "more swelling in feet, hands and we called the doctor" [name] states diuretic doubled for 2 days starting today. RN CM emphasized CHF action plan/ zones. RN CM scheduled home visit for tomorrow.  PLAN See pt for home visit tomorrow  Jacqlyn Larsen [facility], [place] Coordinator [phone]

## 2017-05-12 ENCOUNTER — Other Ambulatory Visit: Payer: Self-pay | Admitting: *Deleted

## 2017-05-12 ENCOUNTER — Encounter: Payer: Self-pay | Admitting: *Deleted

## 2017-05-12 NOTE — Patient Outreach (Signed)
[place] [facility]) Care Management   05/12/2017  BRYN SALINE 10-Oct-1933 [id number]  Norma [name] is an 83 y.o. female  Subjective: Routine home visit with pt, joint visit with Randall,  Patient's daughter reports pt does not weigh daily, has had "more swelling lately in feet, hands and face"  MD has been contacted by daughter and pt taking extra diuretic and to call MD back tomorrow.  Hgb AIC 9.6, CBG today 96 and 298 last hs, pt continues to drink sodas throughout the day and eat concentrated sweets.  Objective:   Vitals:   05/12/17 1342  BP: (!) 102/58  Pulse: 65  Resp: 16  SpO2: 93%   ROS  Physical Exam  Constitutional: She is oriented to person, place, and time. She appears well-developed and well-nourished.  HENT:  Head: Normocephalic.  Neck: Normal range of motion. Neck supple.  Cardiovascular: Normal rate.   Respiratory: Effort normal.  Faint crackles lower lobes posteriorally  GI: Soft. Bowel sounds are normal.  Musculoskeletal: She exhibits edema.  2+ edema left lower extremity 1+ edema right lower extremity  Neurological: She is alert and oriented to person, place, and time.  Skin: Skin is warm.  Psychiatric: She has a normal mood and affect. Her behavior is normal. Thought content normal.    Encounter Medications:   Outpatient Encounter Prescriptions as of 05/12/2017  Medication Sig Note  . carvedilol (COREG) 12.5 MG tablet Take 12.5 mg by mouth 2 (two) times daily with a meal. 02/11/2017: Per patient's daughter dose was increased to this.    . clonazePAM (KLONOPIN) 0.5 MG tablet Take 0.5 mg by mouth 3 (three) times daily as needed for anxiety.    . clopidogrel (PLAVIX) 75 MG tablet Take 75 mg by mouth daily.   . DULoxetine (CYMBALTA) 60 MG capsule Take 60 mg by mouth 2 (two) times daily.    . furosemide (LASIX) 40 MG tablet Take 40 mg by mouth daily as needed for fluid.    Marland Kitchen insulin detemir (LEVEMIR) 100 UNIT/ML injection Inject  40 Units into the skin at bedtime. Based on sugar levels   . lisinopril (PRINIVIL,ZESTRIL) 20 MG tablet Take 1 tablet by mouth daily.   . meclizine (ANTIVERT) 25 MG tablet Take 25 mg by mouth 3 (three) times daily as needed for dizziness.   Marland Kitchen oxyCODONE-acetaminophen (PERCOCET) 10-325 MG tablet Take 1 tablet by mouth 4 (four) times daily. 02/11/2017: Per patient's daughter uses medication as scheduled.    . pantoprazole (PROTONIX) 40 MG tablet Take 1 tablet by mouth daily.   . potassium chloride (K-DUR) 10 MEQ tablet Take 1 tablet by mouth daily.    . promethazine (PHENERGAN) 25 MG tablet Take 1 tablet by mouth every 4 (four) hours as needed.   . simvastatin (ZOCOR) 20 MG tablet Take 20 mg by mouth every evening.     No facility-administered encounter medications on file as of 05/12/2017.     Functional Status:   In your present state of health, do you have any difficulty performing the following activities: 02/12/2017 02/02/2017  Hearing? - N  Vision? - Y  Difficulty concentrating or making decisions? - N  Walking or climbing stairs? - Y  Dressing or bathing? - Y  Doing errands, shopping? - Y  Preparing Food and eating ? Y -  Using the Toilet? N -  In the past six months, have you accidently leaked urine? Y -  Comment wears depends -  Do you have problems  with loss of bowel control? N -  Managing your Medications? Y -  Managing your Finances? Y -  Housekeeping or managing your Housekeeping? Y -  Some recent data might be hidden    Fall/Depression Screening:    Fall Risk  04/07/2017 02/02/2017 01/23/2017  Falls in the past year? Yes Yes Yes  Number falls in past yr: 2 or more 1 2 or more  Injury with Fall? No No No  Risk Factor Category  High Fall Risk High Fall Risk High Fall Risk  Risk for fall due to : History of fall(s);Medication side effect Impaired mobility;History of fall(s) History of fall(s);Impaired balance/gait;Impaired mobility  Follow up Education provided;Falls evaluation  completed;Falls prevention discussed Falls prevention discussed Falls prevention discussed   PHQ 2/9 Scores 02/02/2017 01/23/2017  PHQ - 2 Score 6 5  PHQ- 9 Score 9 18    Assessment:  RN CM reviewed all medications with pt and daughter, RN CM called MD Dr. Quillian Quince and spoke with Caryl Pina, reported pt does have faint crackles lower lobes posterior, has lower extremity edema, dyspnea with exertion and that daughter will call MD office back tomorrow with update, Caryl Pina will let MD know and call pt, RN CM back if any changes today.  Pt to start weighing tomorrow daily at home.  [name]     Most Recent Value  Care Plan Problem One  Knowledge deficit related to diabetes  Role Documenting the Problem One  Care Management Coordinator  Care Plan for Problem One  Active  [name]   pt will have reduction in Hgb AIC by 1-2 points within 90 days  [name] Start Date  02/12/17  Interventions for Problem One Long Term Goal  Pt is drinking sugary cola during visit, RN CM reviewed importance of eliminating sugary soft drinks from diet.  [name]   pt will verbalize foods high in carbohydrates to limit and will choose diet drinks, water instead of soft drinks with sugar within 30 days.  [name] Start Date  05/12/17 [goal restarted]  Interventions for Short Term Goal #1  RN CM reinforced carbohydrate counting, plate method, limiting sugary soft drinks    [name]     Most Recent Value  Care Plan Problem Two  Knowledge deficit related to  CHF  Role Documenting the Problem Two  Care Management Coordinator  Care Plan for Problem Two  Active  [name]   Pt, daughter will verbalize CHF zones/ action plan within 30 days  [name] Start Date  05/12/17  Interventions for Short Term Goal #2   RN CM gave CHF zones/ action plan to pt and reviewed with pt and daughter, Patient's daughter called  primary MD yesterday to report increased edema and MD ask pt to take extra diuretic x 2 days and call back tomorrow, pt is taking extra diuretic and daughter will call MD back tomorrow.  [facility] CM Short Term Goal #2   Pt will weigh daily and record in [facility] calendar.  [facility] CM Short Term Goal #2 Start Date  05/12/17  Interventions for Short Term Goal #2  RN CM ask pt (reviewed with daughter) to start weighing daily in the morning and record in calendar.      Plan: follow up with phone call this week/ ask about weight  Norma [name]  [facility], [place] Coordinator [phone]

## 2017-05-14 ENCOUNTER — Other Ambulatory Visit: Payer: Self-pay | Admitting: *Deleted

## 2017-05-14 NOTE — Patient Outreach (Signed)
Telephone call to pt for follow up on weight, spoke with patient's daughter [name], HIPAA verified, [name] reports patient is taking diuretic daily and weight has dropped to 173 pounds and [name] is in touch with MD as needed, reports "swelling is better"  CBG "ok", no other changes made.  PLAN Follow up with pt beginning of next week Assess weight  Jacqlyn Larsen [facility], [place] Coordinator [phone]

## 2017-05-19 ENCOUNTER — Other Ambulatory Visit: Payer: Self-pay | Admitting: *Deleted

## 2017-05-19 NOTE — Patient Outreach (Signed)
Telephone call to patient for follow up on weight, edema, spoke with patient's daughter Norma [name], HIPAA verified, Norma [name] reports "swelling gone and she's doing so much better"  Taking medication as prescribed, Norma [name] reports having difficulty with daily weights due to pt being unstable when standing and needing to hold onto walker, Norma [name] states weight fluctuates between 140-150's range now but is not sure this is accurate although she is sure pt has "lost a lot of fluid".  RN CM reminded Norma [name] of action plan and resources to call, reminded of safety precautions and ask that pt not weigh if she is unstable.  PLAN Follow up with home visit next week  Norma [name] [facility], [place] Coordinator [phone]

## 2017-05-26 ENCOUNTER — Encounter: Payer: Self-pay | Admitting: *Deleted

## 2017-05-26 ENCOUNTER — Other Ambulatory Visit: Payer: Self-pay | Admitting: *Deleted

## 2017-05-26 NOTE — Patient Outreach (Signed)
[facility]) Care Management   05/26/2017  Norma [name] 1934/01/15 [id number]  Norma [name] is an 83 y.o. female  Subjective: Routine home visit with pt, daughter present, HIPAA verified, daughter states pt has been weighing daily but she does not feel readings are accurate and will be stopping the daily weights due to safety concerns as it is difficult for pt to stand on the scale even with assistance.  Pt taking all medications as prescribed.  [name] states "she's doing so much better, no swelling"    Objective:   Vitals:   05/26/17 1610  BP: 118/62  Pulse: 66  Resp: 18  SpO2: 96%  CBG ranges under 100 most days per daughter with occasional 200-300 reading depending on what pt eats.  [name]     Most Recent Value  Care Plan Problem One  Knowledge deficit related to diabetes  Role Documenting the Problem One  Care Management Coordinator  Care Plan for Problem One  Active  [name]   pt will have reduction in Hgb AIC by 1-2 points within 90 days  [name] Start Date  02/12/17  [facility] Long Term Goal Met Date  05/26/17 [Hgb AIC decreased to 9.6, down from previous 10.6]  Interventions for Problem One Long Term Goal  Pt is drinking sugary cola during visit, RN CM reiterated importance of eliminating sugary soft drinks from diet.  [name]   pt will verbalize foods high in carbohydrates to limit and will choose diet drinks, water instead of soft drinks with sugar within 30 days.  [name] Start Date  05/12/17 [goal restarted]  [name] Met Date  05/26/17  Interventions for Short Term Goal #1  RN CM reinforced and reviewed carbohydrate counting, plate method, limiting sugary soft drinks, pt states she has decreased soft drink intake but still drinks them at times    [facility] CM Care Plan Problem Two     Most Recent Value  Care Plan Problem Two  Knowledge deficit related to  CHF   Role Documenting the Problem Two  Care Management Coordinator  Care Plan for Problem Two  Active  [name]   Pt, daughter will verbalize CHF zones/ action plan within 30 days  [name] Start Date  05/12/17  [facility] CM Short Term Goal #1 Met Date   05/26/17  Interventions for Short Term Goal #2   RN CM reviewed CHF zones/ action plan to pt and reviewed with pt and daughter, pt is taking diuretic as prescribed, has been trying to weigh but has become a safety hazard and will no longer be weighing   [facility]N CM Short Term Goal #2   Pt will weigh daily and record in [facility] calendar.  THN CM Short Term Goal #2 Start Date  05/12/17  [facility] CM Short Term Goal #2 Met Date  05/26/17  Interventions for Short Term Goal #2  Pt has been weighing daily and daughter is recording but weights are not reliable per daughter and vary from 140's-170s range because pt cannot stand up on scale as she should without holding onto walker     ROS  Physical Exam  Constitutional: She is oriented to person, place, and time. She appears well-developed and well-nourished.  HENT:  Head: Normocephalic.  Neck: Normal range of motion. Neck supple.  Cardiovascular: Normal rate.   Respiratory: Effort normal and breath sounds normal.  GI: Soft. Bowel sounds are normal.  Musculoskeletal: Normal range of motion. She exhibits no edema.  Neurological: She is alert and oriented to person, place, and time.  Skin: Skin is warm and dry.  Pt has bruising to right lower extremity anterior, daughter states has been there few days  Psychiatric: She has a normal mood and affect. Her behavior is normal. Thought content normal.  Forgetful at times    Encounter Medications:   Outpatient Encounter Prescriptions as of 05/26/2017  Medication Sig Note  . carvedilol (COREG) 12.5 MG tablet Take 12.5 mg by mouth 2 (two) times daily with a meal. 02/11/2017: Per patient's daughter dose was increased to this.    . clonazePAM  (KLONOPIN) 0.5 MG tablet Take 0.5 mg by mouth 3 (three) times daily as needed for anxiety.    . clopidogrel (PLAVIX) 75 MG tablet Take 75 mg by mouth daily.   . DULoxetine (CYMBALTA) 60 MG capsule Take 60 mg by mouth 2 (two) times daily.    . furosemide (LASIX) 40 MG tablet Take 40 mg by mouth daily as needed for fluid.    Marland Kitchen insulin detemir (LEVEMIR) 100 UNIT/ML injection Inject 40 Units into the skin at bedtime. Based on sugar levels   . lisinopril (PRINIVIL,ZESTRIL) 20 MG tablet Take 1 tablet by mouth daily.   . meclizine (ANTIVERT) 25 MG tablet Take 25 mg by mouth 3 (three) times daily as needed for dizziness.   Marland Kitchen oxyCODONE-acetaminophen (PERCOCET) 10-325 MG tablet Take 1 tablet by mouth 4 (four) times daily. 02/11/2017: Per patient's daughter uses medication as scheduled.    . pantoprazole (PROTONIX) 40 MG tablet Take 1 tablet by mouth daily.   . potassium chloride (K-DUR) 10 MEQ tablet Take 1 tablet by mouth daily.    . promethazine (PHENERGAN) 25 MG tablet Take 1 tablet by mouth every 4 (four) hours as needed.   . simvastatin (ZOCOR) 20 MG tablet Take 20 mg by mouth every evening.     No facility-administered encounter medications on file as of 05/26/2017.     Functional Status:   In your present state of health, do you have any difficulty performing the following activities: 02/12/2017 02/02/2017  Hearing? - N  Vision? - Y  Difficulty concentrating or making decisions? - N  Walking or climbing stairs? - Y  Dressing or bathing? - Y  Doing errands, shopping? - Y  Preparing Food and eating ? Y -  Using the Toilet? N -  In the past six months, have you accidently leaked urine? Y -  Comment wears depends -  Do you have problems with loss of bowel control? N -  Managing your Medications? Y -  Managing your Finances? Y -  Housekeeping or managing your Housekeeping? Y -  Some recent data might be hidden    Fall/Depression Screening:    Fall Risk  04/07/2017 02/02/2017 01/23/2017  Falls  in the past year? Yes Yes Yes  Number falls in past yr: 2 or more 1 2 or more  Injury with Fall? No No No  Risk Factor Category  High Fall Risk High Fall Risk High Fall Risk  Risk for fall due to : History of fall(s);Medication side effect Impaired mobility;History of fall(s) History of fall(s);Impaired balance/gait;Impaired mobility  Follow up Education provided;Falls evaluation completed;Falls prevention discussed Falls prevention discussed Falls prevention discussed   PHQ 2/9 Scores 02/02/2017 01/23/2017  PHQ - 2 Score  6 5  PHQ- 9 Score 9 18    Assessment:  Pt managing better with CHF self care and daughter is in contact MD as needed, daughter is CAP aide and lives with pt.  RN CM reiterated CHF action plan/ zones, reviewed carbohydrate counting, RN CM reviewed medications, talked with daughter about transfer to RN health coach and daughter, pt decline citing pt is not able to weigh and daughter feels she is knowledgeable with symptom management, also feels pt is not going to further change eating habits.  RN CM faxed case closure note to primary MD Dr. Quillian Quince, mailed case closure letter to patient's home.  Plan: discharge today  Jacqlyn Larsen [facility], BSN [place] Coordinator [phone]

## 2017-06-18 ENCOUNTER — Inpatient Hospital Stay (HOSPITAL_COMMUNITY): Payer: Medicare PPO

## 2017-06-18 ENCOUNTER — Inpatient Hospital Stay (HOSPITAL_COMMUNITY)
Admission: EM | Admit: 2017-06-18 | Discharge: 2017-06-22 | DRG: 871 | Disposition: A | Discharge status: Home Health | Payer: Medicare PPO | Attending: Family Medicine | Admitting: Family Medicine

## 2017-06-18 ENCOUNTER — Emergency Department (HOSPITAL_COMMUNITY): Payer: Medicare PPO

## 2017-06-18 ENCOUNTER — Encounter (HOSPITAL_COMMUNITY): Payer: Self-pay

## 2017-06-18 DIAGNOSIS — Z23 Encounter for immunization: Secondary | ICD-10-CM

## 2017-06-18 DIAGNOSIS — E860 Dehydration: Secondary | ICD-10-CM | POA: Diagnosis present

## 2017-06-18 DIAGNOSIS — E876 Hypokalemia: Secondary | ICD-10-CM | POA: Diagnosis present

## 2017-06-18 DIAGNOSIS — R531 Weakness: Secondary | ICD-10-CM

## 2017-06-18 DIAGNOSIS — Z9841 Cataract extraction status, right eye: Secondary | ICD-10-CM | POA: Diagnosis not present

## 2017-06-18 DIAGNOSIS — E118 Type 2 diabetes mellitus with unspecified complications: Secondary | ICD-10-CM | POA: Diagnosis not present

## 2017-06-18 DIAGNOSIS — Z79899 Other long term (current) drug therapy: Secondary | ICD-10-CM | POA: Diagnosis not present

## 2017-06-18 DIAGNOSIS — Z8673 Personal history of transient ischemic attack (TIA), and cerebral infarction without residual deficits: Secondary | ICD-10-CM

## 2017-06-18 DIAGNOSIS — G92 Toxic encephalopathy: Secondary | ICD-10-CM | POA: Diagnosis not present

## 2017-06-18 DIAGNOSIS — E119 Type 2 diabetes mellitus without complications: Secondary | ICD-10-CM

## 2017-06-18 DIAGNOSIS — J9811 Atelectasis: Secondary | ICD-10-CM | POA: Diagnosis present

## 2017-06-18 DIAGNOSIS — I2510 Atherosclerotic heart disease of native coronary artery without angina pectoris: Secondary | ICD-10-CM | POA: Diagnosis present

## 2017-06-18 DIAGNOSIS — N170 Acute kidney failure with tubular necrosis: Secondary | ICD-10-CM | POA: Diagnosis not present

## 2017-06-18 DIAGNOSIS — A419 Sepsis, unspecified organism: Secondary | ICD-10-CM

## 2017-06-18 DIAGNOSIS — R112 Nausea with vomiting, unspecified: Secondary | ICD-10-CM | POA: Diagnosis present

## 2017-06-18 DIAGNOSIS — N182 Chronic kidney disease, stage 2 (mild): Secondary | ICD-10-CM | POA: Diagnosis present

## 2017-06-18 DIAGNOSIS — R1030 Lower abdominal pain, unspecified: Secondary | ICD-10-CM | POA: Diagnosis not present

## 2017-06-18 DIAGNOSIS — R4182 Altered mental status, unspecified: Secondary | ICD-10-CM | POA: Diagnosis not present

## 2017-06-18 DIAGNOSIS — A4159 Other Gram-negative sepsis: Principal | ICD-10-CM | POA: Diagnosis present

## 2017-06-18 DIAGNOSIS — Z794 Long term (current) use of insulin: Secondary | ICD-10-CM | POA: Diagnosis not present

## 2017-06-18 DIAGNOSIS — G894 Chronic pain syndrome: Secondary | ICD-10-CM | POA: Diagnosis present

## 2017-06-18 DIAGNOSIS — E1165 Type 2 diabetes mellitus with hyperglycemia: Secondary | ICD-10-CM | POA: Diagnosis present

## 2017-06-18 DIAGNOSIS — I420 Dilated cardiomyopathy: Secondary | ICD-10-CM | POA: Diagnosis present

## 2017-06-18 DIAGNOSIS — E861 Hypovolemia: Secondary | ICD-10-CM | POA: Diagnosis present

## 2017-06-18 DIAGNOSIS — R109 Unspecified abdominal pain: Secondary | ICD-10-CM | POA: Diagnosis not present

## 2017-06-18 DIAGNOSIS — Z9842 Cataract extraction status, left eye: Secondary | ICD-10-CM

## 2017-06-18 DIAGNOSIS — Z7902 Long term (current) use of antithrombotics/antiplatelets: Secondary | ICD-10-CM

## 2017-06-18 DIAGNOSIS — G928 Other toxic encephalopathy: Secondary | ICD-10-CM | POA: Diagnosis present

## 2017-06-18 DIAGNOSIS — I2789 Other specified pulmonary heart diseases: Secondary | ICD-10-CM | POA: Diagnosis present

## 2017-06-18 DIAGNOSIS — N390 Urinary tract infection, site not specified: Secondary | ICD-10-CM | POA: Diagnosis present

## 2017-06-18 DIAGNOSIS — N179 Acute kidney failure, unspecified: Secondary | ICD-10-CM | POA: Diagnosis present

## 2017-06-18 DIAGNOSIS — I5022 Chronic systolic (congestive) heart failure: Secondary | ICD-10-CM | POA: Diagnosis present

## 2017-06-18 DIAGNOSIS — I959 Hypotension, unspecified: Secondary | ICD-10-CM | POA: Diagnosis present

## 2017-06-18 DIAGNOSIS — E1122 Type 2 diabetes mellitus with diabetic chronic kidney disease: Secondary | ICD-10-CM | POA: Diagnosis present

## 2017-06-18 DIAGNOSIS — I130 Hypertensive heart and chronic kidney disease with heart failure and stage 1 through stage 4 chronic kidney disease, or unspecified chronic kidney disease: Secondary | ICD-10-CM | POA: Diagnosis present

## 2017-06-18 DIAGNOSIS — I9589 Other hypotension: Secondary | ICD-10-CM | POA: Diagnosis not present

## 2017-06-18 LAB — I-STAT CG4 LACTIC ACID, ED
LACTIC ACID, VENOUS: 2.76 mmol/L — AB (ref 0.5–1.9)
Lactic Acid, Venous: 2.57 mmol/L (ref 0.5–1.9)

## 2017-06-18 LAB — GLUCOSE, CAPILLARY
GLUCOSE-CAPILLARY: 205 mg/dL — AB (ref 65–99)
Glucose-Capillary: 262 mg/dL — ABNORMAL HIGH (ref 65–99)

## 2017-06-18 LAB — URINALYSIS, ROUTINE W REFLEX MICROSCOPIC
Bilirubin Urine: NEGATIVE
GLUCOSE, UA: 250 mg/dL — AB
Ketones, ur: NEGATIVE mg/dL
Nitrite: POSITIVE — AB
PH: 5 (ref 5.0–8.0)
PROTEIN: 30 mg/dL — AB
SPECIFIC GRAVITY, URINE: 1.02 (ref 1.005–1.030)

## 2017-06-18 LAB — PROTIME-INR
INR: 1.09
Prothrombin Time: 14 seconds (ref 11.4–15.2)

## 2017-06-18 LAB — URINALYSIS, MICROSCOPIC (REFLEX)

## 2017-06-18 LAB — CBC WITH DIFFERENTIAL/PLATELET
BASOS ABS: 0 10*3/uL (ref 0.0–0.1)
Basophils Relative: 0 %
Eosinophils Absolute: 0 10*3/uL (ref 0.0–0.7)
Eosinophils Relative: 0 %
HEMATOCRIT: 36.8 % (ref 36.0–46.0)
Hemoglobin: 11.8 g/dL — ABNORMAL LOW (ref 12.0–15.0)
LYMPHS PCT: 8 %
Lymphs Abs: 1.7 10*3/uL (ref 0.7–4.0)
MCH: 28.7 pg (ref 26.0–34.0)
MCHC: 32.1 g/dL (ref 30.0–36.0)
MCV: 89.5 fL (ref 78.0–100.0)
Monocytes Absolute: 1.5 10*3/uL — ABNORMAL HIGH (ref 0.1–1.0)
Monocytes Relative: 7 %
NEUTROS PCT: 85 %
Neutro Abs: 18.4 10*3/uL — ABNORMAL HIGH (ref 1.7–7.7)
Platelets: 190 10*3/uL (ref 150–400)
RBC: 4.11 MIL/uL (ref 3.87–5.11)
RDW: 13.8 % (ref 11.5–15.5)
WBC: 21.6 10*3/uL — AB (ref 4.0–10.5)

## 2017-06-18 LAB — COMPREHENSIVE METABOLIC PANEL
ALT: 23 U/L (ref 14–54)
AST: 51 U/L — AB (ref 15–41)
Albumin: 2.9 g/dL — ABNORMAL LOW (ref 3.5–5.0)
Alkaline Phosphatase: 141 U/L — ABNORMAL HIGH (ref 38–126)
Anion gap: 9 (ref 5–15)
BILIRUBIN TOTAL: 0.8 mg/dL (ref 0.3–1.2)
BUN: 23 mg/dL — AB (ref 6–20)
CO2: 21 mmol/L — ABNORMAL LOW (ref 22–32)
Calcium: 8.3 mg/dL — ABNORMAL LOW (ref 8.9–10.3)
Chloride: 102 mmol/L (ref 101–111)
Creatinine, Ser: 1.71 mg/dL — ABNORMAL HIGH (ref 0.44–1.00)
GFR, EST AFRICAN AMERICAN: 31 mL/min — AB (ref >60)
GFR, EST NON AFRICAN AMERICAN: 27 mL/min — AB (ref >60)
Glucose, Bld: 285 mg/dL — ABNORMAL HIGH (ref 65–99)
Potassium: 4.7 mmol/L (ref 3.5–5.1)
Sodium: 132 mmol/L — ABNORMAL LOW (ref 135–145)
TOTAL PROTEIN: 6.3 g/dL — AB (ref 6.5–8.1)

## 2017-06-18 LAB — LACTIC ACID, PLASMA
LACTIC ACID, VENOUS: 1.5 mmol/L (ref 0.5–1.9)
Lactic Acid, Venous: 2.8 mmol/L (ref 0.5–1.9)

## 2017-06-18 LAB — APTT: aPTT: 31 seconds (ref 24–36)

## 2017-06-18 LAB — PROCALCITONIN: PROCALCITONIN: 0.48 ng/mL

## 2017-06-18 MED ORDER — SIMVASTATIN 20 MG PO TABS
20.0000 mg | ORAL_TABLET | Freq: Every evening | ORAL | Status: DC
  Administered 2017-06-18 – 2017-06-21 (×4): 20 mg via ORAL
  Filled 2017-06-18 (×4): qty 1

## 2017-06-18 MED ORDER — CLOPIDOGREL BISULFATE 75 MG PO TABS
75.0000 mg | ORAL_TABLET | Freq: Every day | ORAL | Status: DC
  Administered 2017-06-19 – 2017-06-22 (×4): 75 mg via ORAL
  Filled 2017-06-18 (×4): qty 1

## 2017-06-18 MED ORDER — PANTOPRAZOLE SODIUM 40 MG PO TBEC
40.0000 mg | DELAYED_RELEASE_TABLET | Freq: Every day | ORAL | Status: DC
  Administered 2017-06-18 – 2017-06-21 (×4): 40 mg via ORAL
  Filled 2017-06-18 (×4): qty 1

## 2017-06-18 MED ORDER — OXYCODONE-ACETAMINOPHEN 5-325 MG PO TABS
1.0000 | ORAL_TABLET | Freq: Once | ORAL | Status: AC
  Administered 2017-06-18: 1 via ORAL
  Filled 2017-06-18: qty 1

## 2017-06-18 MED ORDER — ACETAMINOPHEN 325 MG PO TABS
650.0000 mg | ORAL_TABLET | Freq: Four times a day (QID) | ORAL | Status: DC | PRN
  Administered 2017-06-18 – 2017-06-19 (×3): 650 mg via ORAL
  Filled 2017-06-18 (×3): qty 2

## 2017-06-18 MED ORDER — SODIUM CHLORIDE 0.9 % IV BOLUS (SEPSIS)
1000.0000 mL | Freq: Once | INTRAVENOUS | Status: AC
  Administered 2017-06-18: 1000 mL via INTRAVENOUS

## 2017-06-18 MED ORDER — ONDANSETRON HCL 4 MG/2ML IJ SOLN: 4.0000 mg | Freq: Four times a day (QID) | INTRAMUSCULAR | Status: DC | PRN

## 2017-06-18 MED ORDER — PROMETHAZINE HCL 12.5 MG PO TABS
25.0000 mg | ORAL_TABLET | ORAL | Status: DC | PRN
  Administered 2017-06-19: 25 mg via ORAL
  Filled 2017-06-18: qty 2

## 2017-06-18 MED ORDER — CARVEDILOL 3.125 MG PO TABS
3.1250 mg | ORAL_TABLET | Freq: Two times a day (BID) | ORAL | Status: DC
  Administered 2017-06-18 – 2017-06-22 (×8): 3.125 mg via ORAL
  Filled 2017-06-18 (×8): qty 1

## 2017-06-18 MED ORDER — INSULIN ASPART 100 UNIT/ML ~~LOC~~ SOLN: 0.0000 [IU] | Freq: Every day | SUBCUTANEOUS | Status: DC

## 2017-06-18 MED ORDER — INSULIN ASPART 100 UNIT/ML ~~LOC~~ SOLN
0.0000 [IU] | Freq: Three times a day (TID) | SUBCUTANEOUS | Status: DC
  Administered 2017-06-18 – 2017-06-19 (×2): 8 [IU] via SUBCUTANEOUS
  Administered 2017-06-19: 5 [IU] via SUBCUTANEOUS
  Administered 2017-06-19 – 2017-06-20 (×2): 3 [IU] via SUBCUTANEOUS

## 2017-06-18 MED ORDER — SODIUM CHLORIDE 0.9 % IV BOLUS (SEPSIS)
500.0000 mL | Freq: Once | INTRAVENOUS | Status: AC
  Administered 2017-06-18: 500 mL via INTRAVENOUS

## 2017-06-18 MED ORDER — ENOXAPARIN SODIUM 30 MG/0.3ML ~~LOC~~ SOLN
30.0000 mg | SUBCUTANEOUS | Status: DC
  Administered 2017-06-18 – 2017-06-21 (×4): 30 mg via SUBCUTANEOUS
  Filled 2017-06-18 (×4): qty 0.3

## 2017-06-18 MED ORDER — INSULIN DETEMIR 100 UNIT/ML ~~LOC~~ SOLN
20.0000 [IU] | Freq: Every day | SUBCUTANEOUS | Status: DC
  Administered 2017-06-19 – 2017-06-21 (×3): 20 [IU] via SUBCUTANEOUS
  Filled 2017-06-18 (×5): qty 0.2

## 2017-06-18 MED ORDER — MECLIZINE HCL 12.5 MG PO TABS: 25.0000 mg | ORAL_TABLET | Freq: Three times a day (TID) | ORAL | Status: DC | PRN

## 2017-06-18 MED ORDER — SODIUM CHLORIDE 0.9 % IV SOLN
INTRAVENOUS | Status: AC
  Administered 2017-06-18 (×2): via INTRAVENOUS

## 2017-06-18 MED ORDER — INFLUENZA VAC SPLIT HIGH-DOSE 0.5 ML IM SUSY
0.5000 mL | PREFILLED_SYRINGE | INTRAMUSCULAR | Status: AC
  Administered 2017-06-19: 0.5 mL via INTRAMUSCULAR
  Filled 2017-06-18: qty 0.5

## 2017-06-18 MED ORDER — DEXTROSE 5 % IV SOLN
1.0000 g | Freq: Once | INTRAVENOUS | Status: AC
  Administered 2017-06-18: 1 g via INTRAVENOUS
  Filled 2017-06-18: qty 10

## 2017-06-18 MED ORDER — CEFTRIAXONE SODIUM 1 G IJ SOLR
1.0000 g | INTRAMUSCULAR | Status: DC
  Filled 2017-06-18: qty 10

## 2017-06-18 NOTE — ED Notes (Signed)
Lactic Acid made aware to MD Eulis Foster

## 2017-06-18 NOTE — ED Triage Notes (Signed)
Per EMS-pt from home with family, family reports pt woke up with altered mental status this am. Pt has history r side weakness from previous CVA. Pt family reports pt confused and incontinent this am. Pt typically alert and oriented x 4. [id number], 30u levemir given at 0830 this am.

## 2017-06-18 NOTE — H&P (Signed)
TRH H&P   Patient Demographics:    Norma [name], is a 83 y.o. female  MRN: [id number]   DOB - [birth year]  Admit Date - 06/18/2017  Outpatient Primary MD for the patient is Caryl Bis, MD  Referring MD: Dr. Eulis Foster  Outpatient Specialists: None   Patient coming from: Home  Chief Complaint  Patient presents with  . Altered Mental Status    History provided by husband and daughter at bedside.   HPI:    Norma [name]  is a 83 y.o. female, With history of dilated cardiomyopathy, chronic kidney disease stage II, coronary artery disease, uncontrolled type 2 diabetes mellitus, hypertension who was brought to the ED by her family with acute onset of confusion since this morning. Patient was fine until then except for complaining of some abdominal discomfort yesterday. She did not have any fevers, chills, complained of headache, blurred vision, chest pain, shortness of breath, palpitations, dysuria or diarrhea. No joint pain symptoms. No fall or trauma. Did not complain of nausea or vomiting until 2 lips of the vomiting in the ED. No leg swellings, recent travel, recent illness, sick contact or change in her medications. Patient was very confused when presented to the ED and noncommunicative. Her blood glucose was also elevated and was given 30 units of Levemir at home.  In the ED patient was septic with tachypnea, hypotensive with blood pressure 96/50 mmHg, afebrile. Blood work showed leukocytosis with WBC of 20 1.6K hemoglobin of 11.8, sodium of 132, BUN of 23 and creatinine of 1.71 (baseline 1.2). Glucose of 285, calcium 8.3 and lactic acid of 2.57. Patient given 2 L IV normal saline bolus. Subsequently lactic acid was further elevated to 2.76. UA was suggestive of UTI and chest x-ray was negative for infiltrate, showed bibasilar atelectasis.  Sepsis pathway initiated in the ED and  patient received 3 L IV normal saline bolus. She had 2 episodes of vomiting after drinking some Coke. Blood and urine culture sent and patient given 1 g IV Rocephin. Hospitalist consulted for admission to telemetry.    Review of systems:    In addition to the HPI above,  Review of systems Limited due to patient's encephalopathy.  A full 10 point Review of Systems was done, except as stated above, all other Review of Systems were negative.   With Past History of the following :    Past Medical History:  Diagnosis Date  . Acute systolic heart failure ([place])   . Anemia of other chronic disease   . Chronic kidney disease, unspecified   . Chronic pain syndrome   . Congestive heart failure, unspecified   . Coronary atherosclerosis of native coronary artery   . History of blood transfusion 1960's   "when my last child was born" (12/23/2012)  . Hypoxemia   . Other chronic pulmonary heart diseases   . Other primary  cardiomyopathies   . Other specified forms of chronic ischemic heart disease   . Pneumonia    "when I was little" (12/23/2012)  . Stroke [facility]) ~ 2011   "slowed my walking" (12/23/2012)  . Type II diabetes mellitus ([place])   . Unspecified hypertensive kidney disease with chronic kidney disease stage I through stage IV, or unspecified(403.90)   . Unspecified pleural effusion       Past Surgical History:  Procedure Laterality Date  . ABDOMINAL HYSTERECTOMY    . APPENDECTOMY    . CATARACT EXTRACTION, BILATERAL    . CHOLECYSTECTOMY    . DILATION AND CURETTAGE OF UTERUS    . PATELLA RECONSTRUCTION Bilateral    "had my knee caps replaced" (12/23/2012)  . TONSILLECTOMY        Social History:     Social History  Substance Use Topics  . Smoking status: Never Smoker  . Smokeless tobacco: Never Used  . Alcohol use No     Lives - Home with husband  Mobility - independent     Family History :     Family History  Problem Relation Age of Onset  . Heart attack Father  72       MI  . Cancer Father        leukemia  . Cancer Brother   . Heart disease Daughter       Home Medications:   Prior to Admission medications   Medication Sig Start Date End Date Taking? Authorizing Provider  carvedilol (COREG) 12.5 MG tablet Take 12.5 mg by mouth 2 (two) times daily with a meal.   Yes [provider]  clonazePAM (KLONOPIN) 0.5 MG tablet Take 0.5 mg by mouth 3 (three) times daily as needed for anxiety.    Yes [provider]  clopidogrel (PLAVIX) 75 MG tablet Take 75 mg by mouth daily.   Yes [provider]  DULoxetine (CYMBALTA) 60 MG capsule Take 60 mg by mouth 2 (two) times daily.    Yes [provider]  furosemide (LASIX) 40 MG tablet Take 40 mg by mouth daily as needed for fluid.  12/04/10  Yes de Stanford Scotland, MD  insulin detemir (LEVEMIR) 100 UNIT/ML injection Inject 40 Units into the skin at bedtime. Based on sugar levels   Yes [provider]  lisinopril (PRINIVIL,ZESTRIL) 20 MG tablet Take 1 tablet by mouth daily. 02/06/16  Yes [provider]  meclizine (ANTIVERT) 25 MG tablet Take 25 mg by mouth 3 (three) times daily as needed for dizziness.   Yes [provider]  oxyCODONE-acetaminophen (PERCOCET) 10-325 MG tablet Take 1 tablet by mouth 4 (four) times daily.   Yes [provider]  pantoprazole (PROTONIX) 40 MG tablet Take 1 tablet by mouth at bedtime.  02/11/16  Yes [provider]  potassium chloride (K-DUR) 10 MEQ tablet Take 1 tablet by mouth daily.  02/18/16  Yes [provider]  promethazine (PHENERGAN) 25 MG tablet Take 1 tablet by mouth every 4 (four) hours as needed. 02/19/16  Yes [provider]  simvastatin (ZOCOR) 20 MG tablet Take 20 mg by mouth every evening.    Yes [provider]     Allergies:    No Known Allergies   Physical Exam:   Vitals  Blood pressure 119/73, pulse 86, temperature 98 F (36.7 C), temperature source Oral, resp.  rate 20, height 5\' 2"  (1.575 m), weight 85.5 kg (188 lb 7.9 oz), SpO2 98 %.   Gen.: Elderly  female lying in bed, appears fatigued and in some distress HEENT: Pupils reactive bilaterally, EOMI No pallor, dry oral mucosa, supple neck, no JVD Chest: Clear to auscultation bilaterally, no added sounds CVS: Normal S1 and S2, no murmurs rub or gallop GI: Soft, mildly distended, bowel sounds present, lower abdominal tenderness Musculoskeletal: Warm, no edema CNS: Alert and oriented 2 (knows her family and knows that she is in [facility], has some word finding difficulty and unable to carry out a full conversation.)   Data Review:    CBC  Recent Labs Lab 06/18/17 1059  WBC 21.6*  HGB 11.8*  HCT 36.8  PLT 190  MCV 89.5  MCH 28.7  MCHC 32.1  RDW 13.8  LYMPHSABS 1.7  MONOABS 1.5*  EOSABS 0.0  BASOSABS 0.0   ------------------------------------------------------------------------------------------------------------------  Chemistries   Recent Labs Lab 06/18/17 1059  NA 132*  K 4.7  CL 102  CO2 21*  GLUCOSE 285*  BUN 23*  CREATININE 1.71*  CALCIUM 8.3*  AST 51*  ALT 23  ALKPHOS 141*  BILITOT 0.8   ------------------------------------------------------------------------------------------------------------------ estimated creatinine clearance is 26.6 mL/min (A) (by C-G formula based on SCr of 1.71 mg/dL (H)). ------------------------------------------------------------------------------------------------------------------ No results for input(s): TSH, T4TOTAL, T3FREE, THYROIDAB in the last 72 hours.  Invalid input(s): FREET3  Coagulation profile No results for input(s): INR, PROTIME in the last 168 hours. ------------------------------------------------------------------------------------------------------------------- No results for input(s): DDIMER in the last 72  hours. -------------------------------------------------------------------------------------------------------------------  Cardiac Enzymes No results for input(s): CKMB, TROPONINI, MYOGLOBIN in the last 168 hours.  Invalid input(s): CK ------------------------------------------------------------------------------------------------------------------ No results found for: BNP   ---------------------------------------------------------------------------------------------------------------  Urinalysis    Component Value Date/Time   COLORURINE YELLOW 06/18/2017 1024   APPEARANCEUR CLOUDY (A) 06/18/2017 1024   LABSPEC 1.020 06/18/2017 1024   PHURINE 5.0 06/18/2017 1024   GLUCOSEU 250 (A) 06/18/2017 1024   HGBUR SMALL (A) 06/18/2017 1024   BILIRUBINUR NEGATIVE 06/18/2017 1024   KETONESUR NEGATIVE 06/18/2017 1024   PROTEINUR 30 (A) 06/18/2017 1024   UROBILINOGEN 1.0 04/25/2008 1210   NITRITE POSITIVE (A) 06/18/2017 1024   LEUKOCYTESUR MODERATE (A) 06/18/2017 1024    ----------------------------------------------------------------------------------------------------------------   Imaging Results:    Dg Chest Port 1 View  Result Date: 06/18/2017 CLINICAL DATA:  Altered mental status EXAM: PORTABLE CHEST 1 VIEW COMPARISON:  03/17/2017 FINDINGS: Cardiac shadow is stable. Aortic calcifications are again noted. Minimal bibasilar atelectasis is noted. No sizable effusion is seen. No confluent infiltrate is noted. IMPRESSION: Minimal bibasilar atelectasis. Electronically Signed   By: Inez Catalina M.D.   On: 06/18/2017 10:45    My personal review of EKG: Normal sinus rhythm at 80 with new right axis deviation and PACs.   Assessment & Plan:    Principal Problem:   Sepsis ([place]) Likely secondary to UTI. Admit to telemetry. Received 2 L IV normal saline bolus in the ED as per sepsis protocol. Blood pressure improved. Urine culture and blood cultures sent from the ED. On empiric IV  Rocephin. Continue neuro checks.  Active Problems:   Toxic metabolic encephalopathy Secondary to dehydration and sepsis with UTI. Still has  conusion although slightly improved since presentation. Continue neuro checks every 4 for 12 hours. Hold Cymbalta and Klonopin. Avoid narcotics or benzodiazepines.    Hypotension Secondary to sepsis and dehydration. Hold all home blood pressure medication except for carvedilol (dose reduced). Blood pressure improved with IV fluid bolus. Monitor with gentle hydration.     Urinary tract infection without hematuria Plan as outlined above.ollow cultures.  Nausea and vomiting When necessary antiemetics. Place on clear liquid. Will obtain abdominal x-ray given abdominal distention and some lower abdominal tenderness.  Generalized weakness Monitor once sepsis resolved. PT evaluation if needed.    DM2 (diabetes mellitus, type 2) with hyperglycemia ([place]) Resume Lantus at low-dose since patient vomiting in the ED. Monitor on sliding scale coverage. Increase insulin dose if meal intake appropriate.    Acute renal failure superimposed on stage 2 chronic kidney disease ([place]) Secondary to dehydration and sepsis. Hold Lasix, ACE inhibitor. Monitor with IV fluids.    Abdominal pain Check abdominal x-ray.     Dilated cardiomyopathy ([place])  hypovolemic on exam. No recent 2-D echo. Resume coreg at a lower dose. Hold Lasix, ACE inhibitor. Continue Plavix and statin.   DVT Prophylaxis : Lovenox  AM Labs Ordered, also please review Full Orders  Family Communication: Admission, patients condition and plan of care including tests being ordered have been discussed with the patient , Her husband and daughter at bedside  Code Status Full code  Likely DC to  Home  Condition GUARDED   Consults called: None  Admission status: Inpatient  Time spent in minutes : 70   Louellen Molder M.D on 06/18/2017 at 2:28 PM  Between 7am to 7pm - Pager - [phone].  After 7pm go to [email or web address] - password [facility]  [facility] - Office  [phone]

## 2017-06-18 NOTE — ED Notes (Signed)
CRITICAL VALUE ALERT  Critical Value:  Istat lactic acid 2.76  Date & Time Notied:  06/18/17 1339  Provider Notified: Dr. Eulis Foster  Orders Received/Actions taken: EDP notified, no further orders given at this time

## 2017-06-18 NOTE — Progress Notes (Signed)
Pharmacy Antibiotic Note  Norma [name] is a 83 y.o. female admitted on 06/18/2017 with UTI.  Pharmacy has been consulted for ceftriaxone dosing.  Plan: Ceftriaxone 1gm IV q24h F/U cxs and clinical progress  Height: 5\' 2"  (157.5 cm) Weight: 188 lb 7.9 oz (85.5 kg) IBW/kg (Calculated) : 50.1  Temp (24hrs), Avg:98.1 F (36.7 C), Min:98 F (36.7 C), Max:98.3 F (36.8 C)   Recent Labs Lab 06/18/17 1059 06/18/17 1131 06/18/17 1336  WBC 21.6*  --   --   CREATININE 1.71*  --   --   LATICACIDVEN  --  2.57* 2.76*    Estimated Creatinine Clearance: 26.6 mL/min (A) (by C-G formula based on SCr of 1.71 mg/dL (H)).    No Known Allergies  Antimicrobials this admission: Ceftriaxone 10/4 >>   Microbiology results: 10/4 BCx: pending 10/4 UCx: pending  Thank you for allowing pharmacy to be a part of this patient's care.  Isac Sarna, BS Pharm D, [place] Clinical Pharmacist Pager [phone]  06/18/2017 2:51 PM

## 2017-06-18 NOTE — Progress Notes (Signed)
Pt has critical lactic acid. MD notified

## 2017-06-18 NOTE — ED Provider Notes (Signed)
[facility] DEPT Provider Note   CSN: [id number] Arrival date & time: 06/18/17  8413     History   Chief Complaint Chief Complaint  Patient presents with  . Altered Mental Status    HPI Norma [name] is a 83 y.o. female.  Patient brought here by EMS for evaluation of this, reported as onset this morning.  Also today her blood sugar was elevated and she was treated with Levemir 30 units by report.  The patient is unable to give much history.  She is able to communicate, but is not sure where she is here.  Level 5 caveat-poor historian  HPI  Past Medical History:  Diagnosis Date  . Acute systolic heart failure ([place])   . Anemia of other chronic disease   . Chronic kidney disease, unspecified   . Chronic pain syndrome   . Congestive heart failure, unspecified   . Coronary atherosclerosis of native coronary artery   . History of blood transfusion 1960's   "when my last child was born" (12/23/2012)  . Hypoxemia   . Other chronic pulmonary heart diseases   . Other primary cardiomyopathies   . Other specified forms of chronic ischemic heart disease   . Pneumonia    "when I was little" (12/23/2012)  . Stroke [facility]) ~ 2011   "slowed my walking" (12/23/2012)  . Type II diabetes mellitus ([place])   . Unspecified hypertensive kidney disease with chronic kidney disease stage I through stage IV, or unspecified(403.90)   . Unspecified pleural effusion     Patient Active Problem List   Diagnosis Date Noted  . Sepsis ([place]) 06/18/2017  . UTI (lower urinary tract infection) 02/26/2016  . Weakness generalized 02/26/2016  . Weakness 02/26/2016  . DM2 (diabetes mellitus, type 2) ([place]) 02/26/2016  . Hemoptysis 12/24/2012  . Dyspnea on exertion 03/08/2012  . Palpitations 02/05/2012  . Pulmonary nodule 02/05/2012  . Nausea and vomiting 01/03/2011  . Dilated cardiomyopathy ([place]) 12/04/2010  . Acute on chronic congestive heart failure ([place]) 12/04/2010  . Abnormal EKG 12/04/2010  .  Pulmonary hypertension ([place]) 12/04/2010  . Hypotension 12/04/2010  . Chronic kidney disease (CKD), stage III (moderate) ([place]) 12/04/2010  . CAD (coronary artery disease) 12/04/2010    Past Surgical History:  Procedure Laterality Date  . ABDOMINAL HYSTERECTOMY    . APPENDECTOMY    . CATARACT EXTRACTION, BILATERAL    . CHOLECYSTECTOMY    . DILATION AND CURETTAGE OF UTERUS    . PATELLA RECONSTRUCTION Bilateral    "had my knee caps replaced" (12/23/2012)  . TONSILLECTOMY      OB History    No data available       Home Medications    Prior to Admission medications   Medication Sig Start Date End Date Taking? Authorizing Provider  carvedilol (COREG) 12.5 MG tablet Take 12.5 mg by mouth 2 (two) times daily with a meal.   Yes [provider]  clonazePAM (KLONOPIN) 0.5 MG tablet Take 0.5 mg by mouth 3 (three) times daily as needed for anxiety.    Yes [provider]  clopidogrel (PLAVIX) 75 MG tablet Take 75 mg by mouth daily.   Yes [provider]  DULoxetine (CYMBALTA) 60 MG capsule Take 60 mg by mouth 2 (two) times daily.    Yes [provider]  furosemide (LASIX) 40 MG tablet Take 40 mg by mouth daily as needed for fluid.  12/04/10  Yes de Stanford Scotland, MD  insulin detemir (LEVEMIR) 100 UNIT/ML injection  Inject 40 Units into the skin at bedtime. Based on sugar levels   Yes [provider]  lisinopril (PRINIVIL,ZESTRIL) 20 MG tablet Take 1 tablet by mouth daily. 02/06/16  Yes [provider]  meclizine (ANTIVERT) 25 MG tablet Take 25 mg by mouth 3 (three) times daily as needed for dizziness.   Yes [provider]  oxyCODONE-acetaminophen (PERCOCET) 10-325 MG tablet Take 1 tablet by mouth 4 (four) times daily.   Yes [provider]  pantoprazole (PROTONIX) 40 MG tablet Take 1 tablet by mouth at bedtime.  02/11/16  Yes [provider]  potassium chloride (K-DUR) 10 MEQ tablet Take 1 tablet by mouth daily.   02/18/16  Yes [provider]  promethazine (PHENERGAN) 25 MG tablet Take 1 tablet by mouth every 4 (four) hours as needed. 02/19/16  Yes [provider]  simvastatin (ZOCOR) 20 MG tablet Take 20 mg by mouth every evening.    Yes [provider]    Family History Family History  Problem Relation Age of Onset  . Heart attack Father 39       MI  . Cancer Father        leukemia  . Cancer Brother   . Heart disease Daughter     Social History Social History  Substance Use Topics  . Smoking status: Never Smoker  . Smokeless tobacco: Never Used  . Alcohol use No     Allergies   Patient has no known allergies.   Review of Systems Review of Systems  Unable to perform ROS: Other     Physical Exam Updated Vital Signs BP (!) 116/58   Pulse 79   Temp 98.3 F (36.8 C) (Rectal)   Resp 15   Ht 5\' 5"  (1.651 m)   Wt 77.1 kg (170 lb)   SpO2 97%   BMI 28.29 kg/m   Physical Exam  Constitutional: She appears well-developed. No distress.  Elderly, frail  HENT:  Head: Normocephalic and atraumatic.  Oral mucous membranes are dry  Eyes: Pupils are equal, round, and reactive to light. Conjunctivae and EOM are normal.  Neck: Normal range of motion and phonation normal. Neck supple.  Cardiovascular: Normal rate and regular rhythm.   Pulmonary/Chest: Effort normal and breath sounds normal. She exhibits no tenderness.  Abdominal: Soft. She exhibits no distension. There is no tenderness. There is no guarding.  Musculoskeletal: Normal range of motion.  Neurological: She is alert. She exhibits normal muscle tone.  No dysarthria or aphasia.  She is oriented to person and place.  Skin: Skin is warm and dry.  Psychiatric: She has a normal mood and affect. Her behavior is normal.  Nursing note and vitals reviewed.    ED Treatments / Results  Labs (all labs ordered are listed, but only abnormal results are displayed) Labs Reviewed  COMPREHENSIVE METABOLIC PANEL  - Abnormal; Notable for the following:       Result Value   Sodium 132 (*)    CO2 21 (*)    Glucose, Bld 285 (*)    BUN 23 (*)    Creatinine, Ser 1.71 (*)    Calcium 8.3 (*)    Total Protein 6.3 (*)    Albumin 2.9 (*)    AST 51 (*)    Alkaline Phosphatase 141 (*)    GFR calc non Af Amer 27 (*)    GFR calc Af Amer 31 (*)    All other components within normal limits  CBC WITH DIFFERENTIAL/PLATELET -  Abnormal; Notable for the following:    WBC 21.6 (*)    Hemoglobin 11.8 (*)    Neutro Abs 18.4 (*)    Monocytes Absolute 1.5 (*)    All other components within normal limits  URINALYSIS, ROUTINE W REFLEX MICROSCOPIC - Abnormal; Notable for the following:    APPearance CLOUDY (*)    Glucose, UA 250 (*)    Hgb urine dipstick SMALL (*)    Protein, ur 30 (*)    Nitrite POSITIVE (*)    Leukocytes, UA MODERATE (*)    All other components within normal limits  URINALYSIS, MICROSCOPIC (REFLEX) - Abnormal; Notable for the following:    Bacteria, UA MANY (*)    Squamous Epithelial / LPF 0-5 (*)    All other components within normal limits  I-STAT CG4 LACTIC ACID, ED - Abnormal; Notable for the following:    Lactic Acid, Venous 2.57 (*)    All other components within normal limits  CULTURE, BLOOD (ROUTINE X 2)  CULTURE, BLOOD (ROUTINE X 2)  URINE CULTURE  I-STAT CG4 LACTIC ACID, ED    EKG  EKG Interpretation  Date/Time:  Thursday June 18 2017 10:28:01 EDT Ventricular Rate:  80 PR Interval:    QRS Duration: 98 QT Interval:  411 QTC Calculation: 475 R Axis:   173 Text Interpretation:  Sinus or ectopic atrial rhythm Atrial premature complexes Right axis deviation Since last tracing right axis is new - change from 02/26/16 Confirmed by Daleen Bo [phone]) on 06/18/2017 11:14:21 AM       Radiology Dg Chest Port 1 View  Result Date: 06/18/2017 CLINICAL DATA:  Altered mental status EXAM: PORTABLE CHEST 1 VIEW COMPARISON:  03/17/2017 FINDINGS: Cardiac shadow is stable. Aortic  calcifications are again noted. Minimal bibasilar atelectasis is noted. No sizable effusion is seen. No confluent infiltrate is noted. IMPRESSION: Minimal bibasilar atelectasis. Electronically Signed   By: Inez Catalina M.D.   On: 06/18/2017 10:45    Procedures Procedures (including critical care time)  Medications Ordered in ED Medications  sodium chloride 0.9 % bolus 1,000 mL (1,000 mLs Intravenous New Bag/Given 06/18/17 1111)    And  sodium chloride 0.9 % bolus 1,000 mL (0 mLs Intravenous Stopped 06/18/17 1141)    And  sodium chloride 0.9 % bolus 500 mL (0 mLs Intravenous Stopped 06/18/17 1128)  cefTRIAXone (ROCEPHIN) 1 g in dextrose 5 % 50 mL IVPB (0 g Intravenous Stopped 06/18/17 1304)  oxyCODONE-acetaminophen (PERCOCET/ROXICET) 5-325 MG per tablet 1 tablet (1 tablet Oral Given 06/18/17 1314)     Initial Impression / Assessment and Plan / ED Course  I have reviewed the triage vital signs and the nursing notes.  Pertinent labs & imaging results that were available during my care of the patient were reviewed by me and considered in my medical decision making (see chart for details).  Clinical Course as of Jun 19 1327  Thu Jun 18, 2017  1025 Patient arrives clinically dehydrated, skin warm to touch, with reported altered mental status and findings consistent with likely infection.  Will begin treatment and start evaluation for source.  [EW]  1304 Low Sodium: (!) 132 [EW]  1305 High Glucose: (!) 285 [EW]  1305 High BUN: (!) 23 [EW]  1305 High Creatinine: (!) 1.71 [EW]  1305 High Lactic Acid, Venous: (!!) 2.57 [EW]  1305 High WBC: (!) 21.6 [EW]  1306 No consolidation DG Chest Port 1 View [EW]  1308 Abnormal, consistent with UTI. Bacteria, UA: (!) MANY [EW]  1310 Abnormal Nitrite: (!) POSITIVE [EW]  1315 Normal Anion gap: 9 [EW]    Clinical Course User Index [EW] Daleen Bo, MD   BUN  Date Value Ref Range Status  06/18/2017 23 (H) 6 - 20 mg/dL Final  02/27/2016 15 6 - 20 mg/dL  Final  02/26/2016 16 6 - 20 mg/dL Final  12/23/2012 17 6 - 23 mg/dL Final   Creatinine, Ser  Date Value Ref Range Status  06/18/2017 1.71 (H) 0.44 - 1.00 mg/dL Final  02/27/2016 1.13 (H) 0.44 - 1.00 mg/dL Final  02/26/2016 1.26 (H) 0.44 - 1.00 mg/dL Final  12/23/2012 1.29 (H) 0.50 - 1.10 mg/dL Final     Patient Vitals for the past 24 hrs:  BP Temp Temp src Pulse Resp SpO2 Height Weight  06/18/17 1300 (!) 116/58 - - 79 15 97 % - -  06/18/17 1230 (!) 124/45 - - 76 (!) 22 93 % - -  06/18/17 1200 (!) 109/53 - - - (!) 21 - - -  06/18/17 1110 (!) 113/53 - - 70 (!) 22 94 % - -  06/18/17 1040 - 98.3 F (36.8 C) Rectal - - - - -  06/18/17 1030 100/87 - - - 13 - - -  06/18/17 1005 (!) 96/50 98 F (36.7 C) - 80 20 96 % - -  06/18/17 0957 - - - - - - 5\' 5"  (1.651 m) 77.1 kg (170 lb)    1:10 PM Reevaluation with update and discussion. After initial assessment and treatment, an updated evaluation reveals patient is requesting her chronic pain medicine, for back pain.  She is also hungry and thirsty.  Patient updated on findings and plan. Chidera Thivierge L   1:11 PM-Consult complete with hospitalist. Patient case explained and discussed.  He agrees to admit patient for further evaluation and treatment. Call ended at 44: 35  CRITICAL CARE Performed by: Daleen Bo L Total critical care time: 35 minutes Critical care time was exclusive of separately billable procedures and treating other patients. Critical care was necessary to treat or prevent imminent or life-threatening deterioration. Critical care was time spent personally by me on the following activities: development of treatment plan with patient and/or surrogate as well as nursing, discussions with consultants, evaluation of patient's response to treatment, examination of patient, obtaining history from patient or surrogate, ordering and performing treatments and interventions, ordering and review of laboratory studies, ordering and review  of radiographic studies, pulse oximetry and re-evaluation of patient's condition.  Final Clinical Impressions(s) / ED Diagnoses   Final diagnoses:  Urinary tract infection without hematuria, site unspecified  AKI (acute kidney injury) ([place])  Altered mental status, unspecified altered mental status type   Altered mental status, with findings consistent with urinary tract infection.  Mild lactate elevation, doubt severe sepsis.  Patient is chronically debilitated and has diabetes.  She will require admission for observation, and treatment with parenteral therapy including fluids and antibiotics, for now.  The creatinine is elevated above baseline consistent with mild acute kidney injury.  Glucose elevation is nonspecific with normal anion gap.  Mild initial lactate elevation, prior to fluids.  Doubt severe sepsis or impending vascular collapse.  Nursing Notes Reviewed/ Care Coordinated Applicable Imaging Reviewed Interpretation of Laboratory Data incorporated into ED treatment  Plan: Admit   New Prescriptions New Prescriptions   No medications on file     Daleen Bo, MD 06/18/17 1328

## 2017-06-19 DIAGNOSIS — E118 Type 2 diabetes mellitus with unspecified complications: Secondary | ICD-10-CM

## 2017-06-19 DIAGNOSIS — I9589 Other hypotension: Secondary | ICD-10-CM

## 2017-06-19 DIAGNOSIS — E861 Hypovolemia: Secondary | ICD-10-CM

## 2017-06-19 LAB — GLUCOSE, CAPILLARY
GLUCOSE-CAPILLARY: 172 mg/dL — AB (ref 65–99)
GLUCOSE-CAPILLARY: 170 mg/dL — AB (ref 65–99)
GLUCOSE-CAPILLARY: 221 mg/dL — AB (ref 65–99)
GLUCOSE-CAPILLARY: 279 mg/dL — AB (ref 65–99)
Glucose-Capillary: 189 mg/dL — ABNORMAL HIGH (ref 65–99)

## 2017-06-19 LAB — BLOOD CULTURE ID PANEL (REFLEXED)
ACINETOBACTER BAUMANNII: NOT DETECTED
CANDIDA ALBICANS: NOT DETECTED
CANDIDA GLABRATA: NOT DETECTED
CANDIDA KRUSEI: NOT DETECTED
CANDIDA PARAPSILOSIS: NOT DETECTED
CARBAPENEM RESISTANCE: NOT DETECTED
Candida tropicalis: NOT DETECTED
ENTEROBACTERIACEAE SPECIES: DETECTED — AB
Enterobacter cloacae complex: NOT DETECTED
Enterococcus species: NOT DETECTED
Escherichia coli: NOT DETECTED
Haemophilus influenzae: NOT DETECTED
KLEBSIELLA OXYTOCA: NOT DETECTED
KLEBSIELLA PNEUMONIAE: DETECTED — AB
Listeria monocytogenes: NOT DETECTED
Neisseria meningitidis: NOT DETECTED
PSEUDOMONAS AERUGINOSA: NOT DETECTED
Proteus species: NOT DETECTED
STAPHYLOCOCCUS AUREUS BCID: NOT DETECTED
STREPTOCOCCUS PYOGENES: NOT DETECTED
Serratia marcescens: NOT DETECTED
Staphylococcus species: NOT DETECTED
Streptococcus agalactiae: NOT DETECTED
Streptococcus pneumoniae: NOT DETECTED
Streptococcus species: NOT DETECTED

## 2017-06-19 LAB — CBC
HEMATOCRIT: 30.7 % — AB (ref 36.0–46.0)
HEMOGLOBIN: 10.1 g/dL — AB (ref 12.0–15.0)
MCH: 29 pg (ref 26.0–34.0)
MCHC: 32.9 g/dL (ref 30.0–36.0)
MCV: 88.2 fL (ref 78.0–100.0)
Platelets: 201 10*3/uL (ref 150–400)
RBC: 3.48 MIL/uL — ABNORMAL LOW (ref 3.87–5.11)
RDW: 14 % (ref 11.5–15.5)
WBC: 32 10*3/uL — ABNORMAL HIGH (ref 4.0–10.5)

## 2017-06-19 LAB — BASIC METABOLIC PANEL
Anion gap: 10 (ref 5–15)
BUN: 24 mg/dL — ABNORMAL HIGH (ref 6–20)
CHLORIDE: 107 mmol/L (ref 101–111)
CO2: 18 mmol/L — AB (ref 22–32)
CREATININE: 1.68 mg/dL — AB (ref 0.44–1.00)
Calcium: 8.5 mg/dL — ABNORMAL LOW (ref 8.9–10.3)
GFR calc Af Amer: 32 mL/min — ABNORMAL LOW (ref >60)
GFR calc non Af Amer: 28 mL/min — ABNORMAL LOW (ref >60)
Glucose, Bld: 156 mg/dL — ABNORMAL HIGH (ref 65–99)
Potassium: 4 mmol/L (ref 3.5–5.1)
Sodium: 135 mmol/L (ref 135–145)

## 2017-06-19 MED ORDER — PIPERACILLIN-TAZOBACTAM 3.375 G IVPB
3.3750 g | Freq: Three times a day (TID) | INTRAVENOUS | Status: DC
  Administered 2017-06-19 – 2017-06-22 (×8): 3.375 g via INTRAVENOUS
  Filled 2017-06-19 (×11): qty 50

## 2017-06-19 MED ORDER — DEXTROSE 5 % IV SOLN
2.0000 g | INTRAVENOUS | Status: DC
  Filled 2017-06-19: qty 2

## 2017-06-19 NOTE — Progress Notes (Signed)
[facility]  PROGRESS NOTE  Norma [name] QIO:[id number] DOB: 16-Jan-1934 DOA: 06/18/2017 PCP: Caryl Bis, MD   Brief HPI:    83 y.o. female, With history of dilated cardiomyopathy, chronic kidney disease stage II, coronary artery disease, uncontrolled type 2 diabetes mellitus, hypertension who was brought to the ED by her family with acute onset of confusion since yesterday morning. Patient was very confused when presented to the ED and noncommunicative. Her blood glucose was also elevated and was given 30 units of Levemir at home.  In the ED patient was septic with tachypnea, hypotensive with blood pressure 96/50 mmHg, afebrile. Blood work showed leukocytosis with WBC of 20 1.6K Glucose of 285, calcium 8.3 and lactic acid of 2.57. Patient given 2 L IV normal saline bolus. Subsequently lactic acid was further elevated to 2.76. UA was suggestive of UTI and chest x-ray was negative for infiltrate, showed bibasilar atelectasis.  Patient was empirically started on IV Rocephin. Blood and urine cultures were obtained.   Subjective   Patient seen and examined, she is alert and oriented 3 at this time. Denies any symptoms. Blood cultures growing Klebsiella pneumoniae. WBC elevated to 32,000 this morning   Assessment/Plan:     1. Sepsis due to UTI- urine culture is still pending, blood culture growing Klebsiella pneumoniae likely sources UTI. As WBC is now elevated to 32,000, final sensitivities are pending. Will switch antibiotics to IV Zosyn per pharmacy. Will follow both urine and blood culture results. 2. Toxic metabolic encephalopathy-resolved, secondary to dehydration and UTI. Cymbalta and Klonopin  on hold. 3. Hypotension- secondary to sepsis and dehydration, BP meds held except Coreg. BP stable. 4. Diabetes mellitus- continue Lantus, sliding scale insulin with NovoLog. Blood glucose is fairly well controlled 5. Acute kidney injury superimposed on stage II chronic kidney  disease- secondary dehydration and sepsis. Creatinine is 1.68 today, shows mild improvement. Continue to hold Lasix, ACE inhibitor. 6. Abdominal pain-resolved, abdominal x-ray shows no acute abnormality.    DVT prophylaxis: Lovenox  Code Status: full code  Family Communication:  no family present bedside   Disposition Plan:  pending improvement in patients condition   Consultants:   none  Procedures:   none  Continuous infusions . sodium chloride 75 mL/hr at 06/18/17 2258  . piperacillin-tazobactam (ZOSYN)  IV 3.375 g (06/19/17 0839)      Antibiotics:   Anti-infectives    Start     Dose/Rate Route Frequency Ordered Stop   06/19/17 1200  cefTRIAXone (ROCEPHIN) 1 g in dextrose 5 % 50 mL IVPB  Status:  Discontinued     1 g 100 mL/hr over 30 Minutes Intravenous Every 24 hours 06/18/17 1455 06/19/17 0743   06/19/17 1200  cefTRIAXone (ROCEPHIN) 2 g in dextrose 5 % 50 mL IVPB  Status:  Discontinued     2 g 100 mL/hr over 30 Minutes Intravenous Every 24 hours 06/19/17 0743 06/19/17 0811   06/19/17 0900  piperacillin-tazobactam (ZOSYN) IVPB 3.375 g     3.375 g 12.5 mL/hr over 240 Minutes Intravenous Every 8 hours 06/19/17 0812     06/18/17 1230  cefTRIAXone (ROCEPHIN) 1 g in dextrose 5 % 50 mL IVPB     1 g 100 mL/hr over 30 Minutes Intravenous  Once 06/18/17 1228 06/18/17 1304       Objective   Vitals:   06/18/17 1300 06/18/17 1425 06/18/17 2030 06/19/17 0538  BP: (!) 116/58 119/73 120/70 124/72  Pulse: 79 86 85 84  Resp: 15 20 19  20  Temp:  98 F (36.7 C) 99.4 F (37.4 C) 99.3 F (37.4 C)  TempSrc:  Oral Oral Oral  SpO2: 97% 98% 96% 95%  Weight:  85.5 kg (188 lb 7.9 oz)  87 kg (191 lb 12.8 oz)  Height:  5\' 2"  (1.575 m)      Intake/Output Summary (Last 24 hours) at 06/19/17 1128 Last data filed at 06/19/17 0900  Gross per 24 hour  Intake          3498.75 ml  Output                0 ml  Net          3498.75 ml   Filed Weights   06/18/17 0957 06/18/17  1425 06/19/17 0538  Weight: 77.1 kg (170 lb) 85.5 kg (188 lb 7.9 oz) 87 kg (191 lb 12.8 oz)     Physical Examination:   Physical Exam: Eyes: No icterus, extraocular muscles intact  Mouth: Oral mucosa is moist, no lesions on palate,  Neck: Supple, no deformities, masses, or tenderness Lungs: Normal respiratory effort, bilateral clear to auscultation, no crackles or wheezes.  Heart: Regular rate and rhythm, S1 and S2 normal, no murmurs, rubs auscultated Abdomen: BS normoactive,soft,nondistended,non-tender to palpation,no organomegaly Extremities: No pretibial edema, no erythema, no cyanosis, no clubbing Neuro : Alert and oriented to time, place and person, No focal deficits  Skin: No rashes seen on exam     Data Reviewed: I have personally reviewed following labs and imaging studies  CBG:  Recent Labs Lab 06/18/17 1604 06/18/17 2039 06/19/17 0638 06/19/17 0742  GLUCAP 262* 205* 172* 170*    CBC:  Recent Labs Lab 06/18/17 1059 06/19/17 0514  WBC 21.6* 32.0*  NEUTROABS 18.4*  --   HGB 11.8* 10.1*  HCT 36.8 30.7*  MCV 89.5 88.2  PLT 190 947    Basic Metabolic Panel:  Recent Labs Lab 06/18/17 1059 06/19/17 0514  NA 132* 135  K 4.7 4.0  CL 102 107  CO2 21* 18*  GLUCOSE 285* 156*  BUN 23* 24*  CREATININE 1.71* 1.68*  CALCIUM 8.3* 8.5*    Recent Results (from the past 240 hour(s))  Blood Culture (routine x 2)     Status: None (Preliminary result)   Collection Time: 06/18/17 10:59 AM  Result Value Ref Range Status   Specimen Description BLOOD RIGHT WRIST  Final   Special Requests   Final    BOTTLES DRAWN AEROBIC ONLY Blood Culture results may not be optimal due to an inadequate volume of blood received in culture bottles   Culture  Setup Time   Final    AEROBIC BOTTLE GRAM NEGATIVE RODS Gram Stain Report Called to,Read Back By and Verified With: LESLORE,C @ 0104 BY MATTHEWS, B 10.5.18 CRITICAL RESULT CALLED TO, READ BACK BY AND VERIFIED WITH: Festus Holts AT 0962 06/19/17 BY L CHAMPION Performed at [facility], [address] [place], [place] [address]    Culture GRAM NEGATIVE RODS  Final   Report Status PENDING  Incomplete  Blood Culture (routine x 2)     Status: None (Preliminary result)   Collection Time: 06/18/17 10:59 AM  Result Value Ref Range Status   Specimen Description BLOOD LEFT WRIST DRAWN BY RN  Final   Special Requests   Final    BOTTLES DRAWN AEROBIC ONLY Blood Culture results may not be optimal due to an inadequate volume of blood received in culture bottles   Culture  NO GROWTH < 24 HOURS  Final   Report Status PENDING  Incomplete  Blood Culture ID Panel (Reflexed)     Status: Abnormal   Collection Time: 06/18/17 10:59 AM  Result Value Ref Range Status   Enterococcus species NOT DETECTED NOT DETECTED Final   Listeria monocytogenes NOT DETECTED NOT DETECTED Final   Staphylococcus species NOT DETECTED NOT DETECTED Final   Staphylococcus aureus NOT DETECTED NOT DETECTED Final   Streptococcus species NOT DETECTED NOT DETECTED Final   Streptococcus agalactiae NOT DETECTED NOT DETECTED Final   Streptococcus pneumoniae NOT DETECTED NOT DETECTED Final   Streptococcus pyogenes NOT DETECTED NOT DETECTED Final   Acinetobacter baumannii NOT DETECTED NOT DETECTED Final   Enterobacteriaceae species DETECTED (A) NOT DETECTED Final    Comment: Enterobacteriaceae represent a large family of gram-negative bacteria, not a single organism. CRITICAL RESULT CALLED TO, READ BACK BY AND VERIFIED WITH: M.BULLINS RN 06/19/17 0744 L.CHAMPION    Enterobacter cloacae complex NOT DETECTED NOT DETECTED Final   Escherichia coli NOT DETECTED NOT DETECTED Final   Klebsiella oxytoca NOT DETECTED NOT DETECTED Final   Klebsiella pneumoniae DETECTED (A) NOT DETECTED Final    Comment: CRITICAL RESULT CALLED TO, READ BACK BY AND VERIFIED WITH: M.BULLINS RN 06/19/17 0744 L.CHAMPION    Proteus species NOT DETECTED NOT DETECTED Final    Serratia marcescens NOT DETECTED NOT DETECTED Final   Carbapenem resistance NOT DETECTED NOT DETECTED Final   Haemophilus influenzae NOT DETECTED NOT DETECTED Final   Neisseria meningitidis NOT DETECTED NOT DETECTED Final   Pseudomonas aeruginosa NOT DETECTED NOT DETECTED Final   Candida albicans NOT DETECTED NOT DETECTED Final   Candida glabrata NOT DETECTED NOT DETECTED Final   Candida krusei NOT DETECTED NOT DETECTED Final   Candida parapsilosis NOT DETECTED NOT DETECTED Final   Candida tropicalis NOT DETECTED NOT DETECTED Final    Comment: Performed at [facility], [place] [address] [place],  [address]     Liver Function Tests:  Recent Labs Lab 06/18/17 1059  AST 51*  ALT 23  ALKPHOS 141*  BILITOT 0.8  PROT 6.3*  ALBUMIN 2.9*   No results for input(s): LIPASE, AMYLASE in the last 168 hours. No results for input(s): AMMONIA in the last 168 hours.  Cardiac Enzymes: No results for input(s): CKTOTAL, CKMB, CKMBINDEX, TROPONINI in the last 168 hours. BNP (last 3 results) No results for input(s): BNP in the last 8760 hours.  ProBNP (last 3 results) No results for input(s): PROBNP in the last 8760 hours.    Studies: Dg Chest Port 1 View  Result Date: 06/18/2017 CLINICAL DATA:  Altered mental status EXAM: PORTABLE CHEST 1 VIEW COMPARISON:  03/17/2017 FINDINGS: Cardiac shadow is stable. Aortic calcifications are again noted. Minimal bibasilar atelectasis is noted. No sizable effusion is seen. No confluent infiltrate is noted. IMPRESSION: Minimal bibasilar atelectasis. Electronically Signed   By: Inez Catalina M.D.   On: 06/18/2017 10:45   Dg Abd Portable 1v  Result Date: 06/18/2017 CLINICAL DATA:  Abdominal pain.  UTI. EXAM: PORTABLE ABDOMEN - 1 VIEW COMPARISON:  PET-CT, 12/08/2012 FINDINGS: There is no bowel dilation to suggest obstruction or generalized adynamic ileus. Mild colonic stool burden. There is surgical vascular clips in the left mid abdomen. Soft  tissues otherwise unremarkable. The no acute skeletal abnormality. IMPRESSION: 1. No acute findings.  No evidence of bowel obstruction. Electronically Signed   By: Lajean Manes M.D.   On: 06/18/2017 15:07    Scheduled Meds: . carvedilol  3.125 mg Oral BID WC  . clopidogrel  75 mg Oral Daily  . enoxaparin (LOVENOX) injection  30 mg Subcutaneous Q24H  . insulin aspart  0-15 Units Subcutaneous TID WC  . insulin aspart  0-5 Units Subcutaneous QHS  . insulin detemir  20 Units Subcutaneous QHS  . pantoprazole  40 mg Oral QHS  . simvastatin  20 mg Oral QPM      Time spent: 25 minutes  [facility] Pager [phone]. If 7PM-7AM, please contact night-coverage at [email or web address], Office  [phone]  password [place]  06/19/2017, 11:28 AM  LOS: 1 day

## 2017-06-19 NOTE — Progress Notes (Signed)
CRITICAL VALUE ALERT  Critical Value:  Blood cx - + kleb pneumo  Date & Time Notied:  06/19/17 @ 0745  Provider Notified: Dr Darrick Meigs  Orders Received/Actions taken: notified via text page

## 2017-06-19 NOTE — Progress Notes (Signed)
Pharmacy Antibiotic Note  Norma [name] is a 83 y.o. female admitted on 06/18/2017 with UTI and sepsis.  Pharmacy has been consulted for ZOSYN dosing.  Plan: Zosyn 3.375gm IV q8h, EID F/U cxs and clinical progress  Height: 5\' 2"  (157.5 cm) Weight: 191 lb 12.8 oz (87 kg) IBW/kg (Calculated) : 50.1  Temp (24hrs), Avg:98.6 F (37 C), Min:98 F (36.7 C), Max:99.4 F (37.4 C)   Recent Labs Lab 06/18/17 1059 06/18/17 1131 06/18/17 1336 06/18/17 1523 06/18/17 1828 06/19/17 0514  WBC 21.6*  --   --   --   --  32.0*  CREATININE 1.71*  --   --   --   --  1.68*  LATICACIDVEN  --  2.57* 2.76* 1.5 2.8*  --     Estimated Creatinine Clearance: 27.4 mL/min (A) (by C-G formula based on SCr of 1.68 mg/dL (H)).    No Known Allergies  Antimicrobials this admission: Ceftriaxone 10/4 >> 10/5 Zosyn 10/5 >>  Microbiology results: 10/4 BCx: GNR, pending final report, 1 of 2 bottles 10/4 UCx: pending  Thank you for allowing pharmacy to be a part of this patient's care.  Hart Robinsons, PharmD Clinical Pharmacist Pager:  [phone] 06/19/2017   06/19/2017 8:25 AM

## 2017-06-19 NOTE — Care Management Important Message (Signed)
Important Message  Patient Details  Name: Norma [name] MRN: [id number] Date of Birth: [birth year]   Medicare Important Message Given:  Yes    Sherald Barge, RN 06/19/2017, 10:40 AM

## 2017-06-19 NOTE — Progress Notes (Signed)
PHARMACY - PHYSICIAN COMMUNICATION CRITICAL VALUE ALERT - BLOOD CULTURE IDENTIFICATION (BCID)  Results for orders placed or performed during the hospital encounter of 06/18/17  Blood Culture ID Panel (Reflexed) (Collected: 06/18/2017 10:59 AM)  Result Value Ref Range   Enterococcus species NOT DETECTED NOT DETECTED   Listeria monocytogenes NOT DETECTED NOT DETECTED   Staphylococcus species NOT DETECTED NOT DETECTED   Staphylococcus aureus NOT DETECTED NOT DETECTED   Streptococcus species NOT DETECTED NOT DETECTED   Streptococcus agalactiae NOT DETECTED NOT DETECTED   Streptococcus pneumoniae NOT DETECTED NOT DETECTED   Streptococcus pyogenes NOT DETECTED NOT DETECTED   Acinetobacter baumannii NOT DETECTED NOT DETECTED   Enterobacteriaceae species DETECTED (A) NOT DETECTED   Enterobacter cloacae complex NOT DETECTED NOT DETECTED   Escherichia coli NOT DETECTED NOT DETECTED   Klebsiella oxytoca NOT DETECTED NOT DETECTED   Klebsiella pneumoniae DETECTED (A) NOT DETECTED   Proteus species NOT DETECTED NOT DETECTED   Serratia marcescens NOT DETECTED NOT DETECTED   Carbapenem resistance NOT DETECTED NOT DETECTED   Haemophilus influenzae NOT DETECTED NOT DETECTED   Neisseria meningitidis NOT DETECTED NOT DETECTED   Pseudomonas aeruginosa NOT DETECTED NOT DETECTED   Candida albicans NOT DETECTED NOT DETECTED   Candida glabrata NOT DETECTED NOT DETECTED   Candida krusei NOT DETECTED NOT DETECTED   Candida parapsilosis NOT DETECTED NOT DETECTED   Candida tropicalis NOT DETECTED NOT DETECTED   Name of physician (or Provider) Contacted: Dr Clementeen Graham  Changes to prescribed antibiotics required: changed to Zosyn today, 1 of 2 bottles (+), consider switch to Rocephin 2gm IV q24hrs if not deemed to be a contaminate.    Hart Robinsons A 06/19/2017  1:12 PM

## 2017-06-19 NOTE — Care Management Note (Signed)
Case Management Note  Patient Details  Name: Norma [name] MRN: [id number] Date of Birth: November 06, 1933  Subjective/Objective:                  Admitted with sepsis. Pt is from home, lives with her husband. She has PCP, transportation and insurance with drug coverage. She uses a WC for mobility, she is unable to propel her WC herself. Pt's dtr helps with ADL's. If pt were to need [place] she wants only [place]. She is on oxygen acutely.   Action/Plan: Do not anticipate DC home over weekend, will need to wean from oxgyen. If [facility] referral needs to be sent to [facility]. (Phone #: [phone] and fax [facility] orders to [phone])  Expected Discharge Date:       06/23/2017           Expected Discharge Plan:  Home/Self Care (vs Home with [facility])  In-House Referral:  NA  Discharge planning Services  CM Consult  Post Acute Care Choice:  Home Health Choice offered to:  Patient  HH Arranged:    [place] Agency:  [place]  Status of Service:  In process, will continue to follow  Sherald Barge, RN 06/19/2017, 12:31 PM

## 2017-06-20 DIAGNOSIS — R4182 Altered mental status, unspecified: Secondary | ICD-10-CM

## 2017-06-20 DIAGNOSIS — N179 Acute kidney failure, unspecified: Secondary | ICD-10-CM

## 2017-06-20 LAB — CBC
HCT: 31.6 % — ABNORMAL LOW (ref 36.0–46.0)
Hemoglobin: 10.6 g/dL — ABNORMAL LOW (ref 12.0–15.0)
MCH: 28.7 pg (ref 26.0–34.0)
MCHC: 33.5 g/dL (ref 30.0–36.0)
MCV: 85.6 fL (ref 78.0–100.0)
Platelets: 203 10*3/uL (ref 150–400)
RBC: 3.69 MIL/uL — ABNORMAL LOW (ref 3.87–5.11)
RDW: 14 % (ref 11.5–15.5)
WBC: 23.5 10*3/uL — ABNORMAL HIGH (ref 4.0–10.5)

## 2017-06-20 LAB — BASIC METABOLIC PANEL
Anion gap: 9 (ref 5–15)
BUN: 31 mg/dL — AB (ref 6–20)
CALCIUM: 9 mg/dL (ref 8.9–10.3)
CO2: 21 mmol/L — ABNORMAL LOW (ref 22–32)
CREATININE: 2.08 mg/dL — AB (ref 0.44–1.00)
Chloride: 108 mmol/L (ref 101–111)
GFR calc Af Amer: 25 mL/min — ABNORMAL LOW (ref >60)
GFR, EST NON AFRICAN AMERICAN: 21 mL/min — AB (ref >60)
GLUCOSE: 103 mg/dL — AB (ref 65–99)
Potassium: 3.5 mmol/L (ref 3.5–5.1)
Sodium: 138 mmol/L (ref 135–145)

## 2017-06-20 LAB — GLUCOSE, CAPILLARY
GLUCOSE-CAPILLARY: 174 mg/dL — AB (ref 65–99)
GLUCOSE-CAPILLARY: 167 mg/dL — AB (ref 65–99)
Glucose-Capillary: 109 mg/dL — ABNORMAL HIGH (ref 65–99)
Glucose-Capillary: 93 mg/dL (ref 65–99)

## 2017-06-20 MED ORDER — LORAZEPAM 0.5 MG PO TABS: 0.5000 mg | ORAL_TABLET | Freq: Every evening | ORAL | Status: DC | PRN

## 2017-06-20 MED ORDER — SODIUM CHLORIDE 0.9 % IV SOLN
INTRAVENOUS | Status: DC
  Administered 2017-06-20 – 2017-06-22 (×3): via INTRAVENOUS

## 2017-06-20 MED ORDER — OXYCODONE-ACETAMINOPHEN 5-325 MG PO TABS
1.0000 | ORAL_TABLET | Freq: Four times a day (QID) | ORAL | Status: DC | PRN
  Administered 2017-06-20: 1 via ORAL
  Filled 2017-06-20 (×2): qty 1

## 2017-06-20 NOTE — Progress Notes (Signed)
Late entry:  Night AC called [facility] and scheduled PICC line placement for today after 0900.

## 2017-06-20 NOTE — Progress Notes (Signed)
[facility]  PROGRESS NOTE  Norma [name]:[id number] DOB: 11/11/1933 DOA: 06/18/2017 PCP: Caryl Bis, MD   Brief HPI:    83 y.o. female, With history of dilated cardiomyopathy, chronic kidney disease stage II, coronary artery disease, uncontrolled type 2 diabetes mellitus, hypertension who was brought to the ED by her family with acute onset of confusion since yesterday morning. Patient was very confused when presented to the ED and noncommunicative. Her blood glucose was also elevated and was given 30 units of Levemir at home.  In the ED patient was septic with tachypnea, hypotensive with blood pressure 96/50 mmHg, afebrile. Blood work showed leukocytosis with WBC of 20 1.6K Glucose of 285, calcium 8.3 and lactic acid of 2.57. Patient given 2 L IV normal saline bolus. Subsequently lactic acid was further elevated to 2.76. UA was suggestive of UTI and chest x-ray was negative for infiltrate, showed bibasilar atelectasis.  Patient was empirically started on IV Rocephin. Blood and urine cultures were obtained.   Subjective   Patient seen and examined, denies any complaints. Was started on IV Zosyn yesterday. WBC down to 23,000. Blood cultures growing Klebsiella pneumoniae, sensitivities are pending.   Assessment/Plan:     1. Sepsis due to UTI- urine cultureGrowing greater than 100,000 colonies per mL of gram-negative rods.Blood culture growing Klebsiella pneumoniae likely sources UTI. WBC is now down to 23,000. Continue IV Zosyn per pharmacy. Will await final culture and sensitivity report. 2. Toxic metabolic encephalopathy-resolved, secondary to dehydration and UTI. Cymbalta and Klonopin  on hold. 3. Hypotension- secondary to sepsis and dehydration, BP meds held except Coreg. BP stable. 4. Diabetes mellitus- continue Lantus, sliding scale insulin with NovoLog. Blood glucose is fairly well controlled 5. Acute kidney injury superimposed on stage II chronic kidney disease-  secondary to  dehydration and sepsis. Creatinine is 2.08, was from yesterday. Continue IV normal saline at 75 ML per hour. Will follow BMP in a.m. 6. Abdominal pain-resolved, abdominal x-ray shows no acute abnormality. 7. Coronary artery disease-stable, continue Plavix.    DVT prophylaxis: Lovenox  Code Status: full code  Family Communication:  no family present bedside   Disposition Plan:  pending improvement in patients condition   Consultants:   none  Procedures:   none  Continuous infusions . piperacillin-tazobactam (ZOSYN)  IV Stopped (06/19/17 2130)      Antibiotics:   Anti-infectives    Start     Dose/Rate Route Frequency Ordered Stop   06/19/17 1200  cefTRIAXone (ROCEPHIN) 1 g in dextrose 5 % 50 mL IVPB  Status:  Discontinued     1 g 100 mL/hr over 30 Minutes Intravenous Every 24 hours 06/18/17 1455 06/19/17 0743   06/19/17 1200  cefTRIAXone (ROCEPHIN) 2 g in dextrose 5 % 50 mL IVPB  Status:  Discontinued     2 g 100 mL/hr over 30 Minutes Intravenous Every 24 hours 06/19/17 0743 06/19/17 0811   06/19/17 0900  piperacillin-tazobactam (ZOSYN) IVPB 3.375 g     3.375 g 12.5 mL/hr over 240 Minutes Intravenous Every 8 hours 06/19/17 0812     06/18/17 1230  cefTRIAXone (ROCEPHIN) 1 g in dextrose 5 % 50 mL IVPB     1 g 100 mL/hr over 30 Minutes Intravenous  Once 06/18/17 1228 06/18/17 1304       Objective   Vitals:   06/19/17 1354 06/19/17 2143 06/20/17 0521 06/20/17 1004  BP: (!) 124/55 (!) 126/53 (!) 146/71   Pulse: 79 81 83 76  Resp: 20 20  20   Temp: 98.8 F (37.1 C) 99.1 F (37.3 C) (!) 101.1 F (38.4 C)   TempSrc: Oral Oral Axillary   SpO2: 100% 100% 98%   Weight:      Height:        Intake/Output Summary (Last 24 hours) at 06/20/17 1211 Last data filed at 06/20/17 0522  Gross per 24 hour  Intake              815 ml  Output              600 ml  Net              215 ml   Filed Weights   06/18/17 0957 06/18/17 1425 06/19/17 0538  Weight:  77.1 kg (170 lb) 85.5 kg (188 lb 7.9 oz) 87 kg (191 lb 12.8 oz)     Physical Examination:   Physical Exam: Eyes: No icterus, extraocular muscles intact  Mouth: Oral mucosa is moist, no lesions on palate,  Neck: Supple, no deformities, masses, or tenderness Lungs: Normal respiratory effort, bilateral clear to auscultation, no crackles or wheezes.  Heart: Regular rate and rhythm, S1 and S2 normal, no murmurs, rubs auscultated Abdomen: BS normoactive,soft,nondistended,non-tender to palpation,no organomegaly Extremities: No pretibial edema, no erythema, no cyanosis, no clubbing Neuro : Alert and oriented to time, place and person, No focal deficits Skin: No rashes seen on exam    Data Reviewed: I have personally reviewed following labs and imaging studies  CBG:  Recent Labs Lab 06/19/17 1123 06/19/17 1619 06/19/17 2140 06/20/17 0728 06/20/17 1158  GLUCAP 221* 279* 189* 109* 93    CBC:  Recent Labs Lab 06/18/17 1059 06/19/17 0514 06/20/17 1014  WBC 21.6* 32.0* 23.5*  NEUTROABS 18.4*  --   --   HGB 11.8* 10.1* 10.6*  HCT 36.8 30.7* 31.6*  MCV 89.5 88.2 85.6  PLT 190 201 315    Basic Metabolic Panel:  Recent Labs Lab 06/18/17 1059 06/19/17 0514 06/20/17 1014  NA 132* 135 138  K 4.7 4.0 3.5  CL 102 107 108  CO2 21* 18* 21*  GLUCOSE 285* 156* 103*  BUN 23* 24* 31*  CREATININE 1.71* 1.68* 2.08*  CALCIUM 8.3* 8.5* 9.0    Recent Results (from the past 240 hour(s))  Urine culture     Status: Abnormal (Preliminary result)   Collection Time: 06/18/17 10:24 AM  Result Value Ref Range Status   Specimen Description URINE, CATHETERIZED  Final   Special Requests NONE  Final   Culture >=100,000 COLONIES/mL GRAM NEGATIVE RODS (A)  Final   Report Status PENDING  Incomplete  Blood Culture (routine x 2)     Status: Abnormal (Preliminary result)   Collection Time: 06/18/17 10:59 AM  Result Value Ref Range Status   Specimen Description BLOOD RIGHT WRIST  Final    Special Requests   Final    BOTTLES DRAWN AEROBIC ONLY Blood Culture results may not be optimal due to an inadequate volume of blood received in culture bottles   Culture  Setup Time   Final    AEROBIC BOTTLE GRAM NEGATIVE RODS Gram Stain Report Called to,Read Back By and Verified With: L[place],C @ 0104 BY MATTHEWS, B 10.5.18 CRITICAL RESULT CALLED TO, READ BACK BY AND VERIFIED WITH: Festus Holts AT 1761 06/19/17 BY L CHAMPION    Culture (A)  Final    KLEBSIELLA PNEUMONIAE SUSCEPTIBILITIES TO FOLLOW Performed at [facility], [place] [address], [place], [place] [address]  Report Status PENDING  Incomplete  Blood Culture (routine x 2)     Status: None (Preliminary result)   Collection Time: 06/18/17 10:59 AM  Result Value Ref Range Status   Specimen Description BLOOD LEFT WRIST DRAWN BY RN  Final   Special Requests   Final    BOTTLES DRAWN AEROBIC ONLY Blood Culture results may not be optimal due to an inadequate volume of blood received in culture bottles   Culture NO GROWTH 2 DAYS  Final   Report Status PENDING  Incomplete  Blood Culture ID Panel (Reflexed)     Status: Abnormal   Collection Time: 06/18/17 10:59 AM  Result Value Ref Range Status   Enterococcus species NOT DETECTED NOT DETECTED Final   Listeria monocytogenes NOT DETECTED NOT DETECTED Final   Staphylococcus species NOT DETECTED NOT DETECTED Final   Staphylococcus aureus NOT DETECTED NOT DETECTED Final   Streptococcus species NOT DETECTED NOT DETECTED Final   Streptococcus agalactiae NOT DETECTED NOT DETECTED Final   Streptococcus pneumoniae NOT DETECTED NOT DETECTED Final   Streptococcus pyogenes NOT DETECTED NOT DETECTED Final   Acinetobacter baumannii NOT DETECTED NOT DETECTED Final   Enterobacteriaceae species DETECTED (A) NOT DETECTED Final    Comment: Enterobacteriaceae represent a large family of gram-negative bacteria, not a single organism. CRITICAL RESULT CALLED TO, READ BACK BY AND VERIFIED  WITH: M.BULLINS RN 06/19/17 0744 L.CHAMPION    Enterobacter cloacae complex NOT DETECTED NOT DETECTED Final   Escherichia coli NOT DETECTED NOT DETECTED Final   Klebsiella oxytoca NOT DETECTED NOT DETECTED Final   Klebsiella pneumoniae DETECTED (A) NOT DETECTED Final    Comment: CRITICAL RESULT CALLED TO, READ BACK BY AND VERIFIED WITH: M.BULLINS RN 06/19/17 0744 L.CHAMPION    Proteus species NOT DETECTED NOT DETECTED Final   Serratia marcescens NOT DETECTED NOT DETECTED Final   Carbapenem resistance NOT DETECTED NOT DETECTED Final   Haemophilus influenzae NOT DETECTED NOT DETECTED Final   Neisseria meningitidis NOT DETECTED NOT DETECTED Final   Pseudomonas aeruginosa NOT DETECTED NOT DETECTED Final   Candida albicans NOT DETECTED NOT DETECTED Final   Candida glabrata NOT DETECTED NOT DETECTED Final   Candida krusei NOT DETECTED NOT DETECTED Final   Candida parapsilosis NOT DETECTED NOT DETECTED Final   Candida tropicalis NOT DETECTED NOT DETECTED Final    Comment: Performed at [facility], [place] [address] [place], [place] [address]     Liver Function Tests:  Recent Labs Lab 06/18/17 1059  AST 51*  ALT 23  ALKPHOS 141*  BILITOT 0.8  PROT 6.3*  ALBUMIN 2.9*   No results for input(s): LIPASE, AMYLASE in the last 168 hours. No results for input(s): AMMONIA in the last 168 hours.  Cardiac Enzymes: No results for input(s): CKTOTAL, CKMB, CKMBINDEX, TROPONINI in the last 168 hours. BNP (last 3 results) No results for input(s): BNP in the last 8760 hours.  ProBNP (last 3 results) No results for input(s): PROBNP in the last 8760 hours.    Studies: Dg Abd Portable 1v  Result Date: 06/18/2017 CLINICAL DATA:  Abdominal pain.  UTI. EXAM: PORTABLE ABDOMEN - 1 VIEW COMPARISON:  PET-CT, 12/08/2012 FINDINGS: There is no bowel dilation to suggest obstruction or generalized adynamic ileus. Mild colonic stool burden. There is surgical vascular clips in the left mid abdomen. Soft  tissues otherwise unremarkable. The no acute skeletal abnormality. IMPRESSION: 1. No acute findings.  No evidence of bowel obstruction. Electronically Signed   By: Dedra Skeens.D.  On: 06/18/2017 15:07    Scheduled Meds: . carvedilol  3.125 mg Oral BID WC  . clopidogrel  75 mg Oral Daily  . enoxaparin (LOVENOX) injection  30 mg Subcutaneous Q24H  . insulin aspart  0-15 Units Subcutaneous TID WC  . insulin aspart  0-5 Units Subcutaneous QHS  . insulin detemir  20 Units Subcutaneous QHS  . pantoprazole  40 mg Oral QHS  . simvastatin  20 mg Oral QPM      Time spent: 25 minutes  [facility] Pager [phone]. If 7PM-7AM, please contact night-coverage at [email or web address], Office  [phone]  password [email or web address]  06/20/2017, 12:11 PM  LOS: 2 days

## 2017-06-21 DIAGNOSIS — R109 Unspecified abdominal pain: Secondary | ICD-10-CM

## 2017-06-21 LAB — CULTURE, BLOOD (ROUTINE X 2)

## 2017-06-21 LAB — COMPREHENSIVE METABOLIC PANEL
ALT: 14 U/L (ref 14–54)
AST: 13 U/L — ABNORMAL LOW (ref 15–41)
Albumin: 2.2 g/dL — ABNORMAL LOW (ref 3.5–5.0)
Alkaline Phosphatase: 133 U/L — ABNORMAL HIGH (ref 38–126)
Anion gap: 7 (ref 5–15)
BUN: 29 mg/dL — ABNORMAL HIGH (ref 6–20)
CHLORIDE: 110 mmol/L (ref 101–111)
CO2: 21 mmol/L — ABNORMAL LOW (ref 22–32)
Calcium: 8.3 mg/dL — ABNORMAL LOW (ref 8.9–10.3)
Creatinine, Ser: 1.84 mg/dL — ABNORMAL HIGH (ref 0.44–1.00)
GFR, EST AFRICAN AMERICAN: 29 mL/min — AB (ref >60)
GFR, EST NON AFRICAN AMERICAN: 25 mL/min — AB (ref >60)
Glucose, Bld: 102 mg/dL — ABNORMAL HIGH (ref 65–99)
POTASSIUM: 3.2 mmol/L — AB (ref 3.5–5.1)
Sodium: 138 mmol/L (ref 135–145)
Total Bilirubin: 0.8 mg/dL (ref 0.3–1.2)
Total Protein: 5.5 g/dL — ABNORMAL LOW (ref 6.5–8.1)

## 2017-06-21 LAB — GLUCOSE, CAPILLARY
GLUCOSE-CAPILLARY: 75 mg/dL (ref 65–99)
GLUCOSE-CAPILLARY: 73 mg/dL (ref 65–99)
GLUCOSE-CAPILLARY: 144 mg/dL — AB (ref 65–99)
Glucose-Capillary: 95 mg/dL (ref 65–99)

## 2017-06-21 LAB — CBC
HCT: 29.5 % — ABNORMAL LOW (ref 36.0–46.0)
Hemoglobin: 9.9 g/dL — ABNORMAL LOW (ref 12.0–15.0)
MCH: 28.9 pg (ref 26.0–34.0)
MCHC: 33.6 g/dL (ref 30.0–36.0)
MCV: 86.3 fL (ref 78.0–100.0)
PLATELETS: 207 10*3/uL (ref 150–400)
RBC: 3.42 MIL/uL — ABNORMAL LOW (ref 3.87–5.11)
RDW: 13.8 % (ref 11.5–15.5)
WBC: 16.5 10*3/uL — AB (ref 4.0–10.5)

## 2017-06-21 LAB — URINE CULTURE

## 2017-06-21 MED ORDER — POTASSIUM CHLORIDE CRYS ER 20 MEQ PO TBCR
40.0000 meq | EXTENDED_RELEASE_TABLET | ORAL | Status: AC
  Administered 2017-06-21 (×2): 40 meq via ORAL
  Filled 2017-06-21 (×2): qty 2

## 2017-06-21 NOTE — Progress Notes (Signed)
[facility]  PROGRESS NOTE  Norma [name] CBS:[id number] DOB: 09/03/1934 DOA: 06/18/2017 PCP: Caryl Bis, MD   Brief HPI:    83 y.o. female, With history of dilated cardiomyopathy, chronic kidney disease stage II, coronary artery disease, uncontrolled type 2 diabetes mellitus, hypertension who was brought to the ED by her family with acute onset of confusion since yesterday morning. Patient was very confused when presented to the ED and noncommunicative. Her blood glucose was also elevated and was given 30 units of Levemir at home.  In the ED patient was septic with tachypnea, hypotensive with blood pressure 96/50 mmHg, afebrile. Blood work showed leukocytosis with WBC of 20 1.6K Glucose of 285, calcium 8.3 and lactic acid of 2.57. Patient given 2 L IV normal saline bolus. Subsequently lactic acid was further elevated to 2.76. UA was suggestive of UTI and chest x-ray was negative for infiltrate, showed bibasilar atelectasis.  Patient was empirically started on IV Rocephin. Blood and urine cultures were obtained.   Subjective   Patient seen and examined, pleasantly confused. WBC improving to 16,000. Urine culture growing Klebsiella pneumoniae. Blood culture results also having Klebsiella pneumoniae but Final Sensitivities Pending.   Assessment/Plan:     1. Sepsis due to UTI- urine cultureGrew Klebsiella pneumoniae, sensitive to cefazolin and Zosyn. Patient was started on IV Zosyn per pharmacy consultation. WBC is down to 16,000. Will await final sensitivities from blood cultures.  2. Toxic metabolic encephalopathy-stable, patient is pleasantly confused, though she is improving. This is secondary to dehydration and UTI. Cymbalta and Klonopin  on hold. 3. Hypotension- secondary to sepsis and dehydration, BP meds held except Coreg. BP stable. 4. Hypokalemia- potassium is 3.2, K-Dur 40 mg by mouth 2 every 4 hours has been ordered. Will follow BMP in a.m. 5. Diabetes mellitus-  continue Lantus, sliding scale insulin with NovoLog. Blood glucose is fairly well controlled 6. Acute kidney injury superimposed on stage II chronic kidney disease- secondary to  dehydration and sepsis. Creatinine is 1.84 it was 2.08  from yesterday. Continue IV normal saline at 75 ML per hour. Will follow BMP in a.m. 7. Abdominal pain-resolved, abdominal x-ray shows no acute abnormality. 8. Coronary artery disease-stable, continue Plavix.    DVT prophylaxis: Lovenox  Code Status: full code  Family Communication:  no family present bedside   Disposition Plan:  pending improvement in patients condition   Consultants:   none  Procedures:   none  Continuous infusions . piperacillin-tazobactam (ZOSYN)  IV 3.375 g (06/21/17 0518)  . sodium chloride 0.9 % 1,000 mL infusion 75 mL/hr at 06/21/17 0523      Antibiotics:   Anti-infectives    Start     Dose/Rate Route Frequency Ordered Stop   06/19/17 1200  cefTRIAXone (ROCEPHIN) 1 g in dextrose 5 % 50 mL IVPB  Status:  Discontinued     1 g 100 mL/hr over 30 Minutes Intravenous Every 24 hours 06/18/17 1455 06/19/17 0743   06/19/17 1200  cefTRIAXone (ROCEPHIN) 2 g in dextrose 5 % 50 mL IVPB  Status:  Discontinued     2 g 100 mL/hr over 30 Minutes Intravenous Every 24 hours 06/19/17 0743 06/19/17 0811   06/19/17 0900  piperacillin-tazobactam (ZOSYN) IVPB 3.375 g     3.375 g 12.5 mL/hr over 240 Minutes Intravenous Every 8 hours 06/19/17 0812     06/18/17 1230  cefTRIAXone (ROCEPHIN) 1 g in dextrose 5 % 50 mL IVPB     1 g 100 mL/hr over 30 Minutes  Intravenous  Once 06/18/17 1228 06/18/17 1304       Objective   Vitals:   06/20/17 1446 06/20/17 2040 06/20/17 2136 06/21/17 0612  BP: (!) 151/66  (!) 153/89 (!) 117/96  Pulse: 83  75 80  Resp: 20  20 20   Temp: 98.3 F (36.8 C)  99.6 F (37.6 C) 99.1 F (37.3 C)  TempSrc: Oral  Oral Oral  SpO2: 97% 93% 96% 99%  Weight:      Height:        Intake/Output Summary (Last 24  hours) at 06/21/17 1010 Last data filed at 06/21/17 9562  Gross per 24 hour  Intake             1560 ml  Output             1000 ml  Net              560 ml   Filed Weights   06/18/17 0957 06/18/17 1425 06/19/17 0538  Weight: 77.1 kg (170 lb) 85.5 kg (188 lb 7.9 oz) 87 kg (191 lb 12.8 oz)     Physical Examination:    Physical Exam: Eyes: No icterus, extraocular muscles intact  Mouth: Oral mucosa is moist, no lesions on palate,  Neck: Supple, no deformities, masses, or tenderness Lungs: Normal respiratory effort, bilateral clear to auscultation, no crackles or wheezes.  Heart: Regular rate and rhythm, S1 and S2 normal, no murmurs, rubs auscultated Abdomen: BS normoactive,soft,nondistended,non-tender to palpation,no organomegaly Extremities: No pretibial edema, no erythema, no cyanosis, no clubbing Neuro : Alert , pleasantly confused Skin: No rashes seen on exam    Data Reviewed: I have personally reviewed following labs and imaging studies  CBG:  Recent Labs Lab 06/20/17 0728 06/20/17 1158 06/20/17 1641 06/20/17 2141 06/21/17 0731  GLUCAP 109* 93 174* 167* 75    CBC:  Recent Labs Lab 06/18/17 1059 06/19/17 0514 06/20/17 1014 06/21/17 0403  WBC 21.6* 32.0* 23.5* 16.5*  NEUTROABS 18.4*  --   --   --   HGB 11.8* 10.1* 10.6* 9.9*  HCT 36.8 30.7* 31.6* 29.5*  MCV 89.5 88.2 85.6 86.3  PLT 190 201 203 130    Basic Metabolic Panel:  Recent Labs Lab 06/18/17 1059 06/19/17 0514 06/20/17 1014 06/21/17 0403  NA 132* 135 138 138  K 4.7 4.0 3.5 3.2*  CL 102 107 108 110  CO2 21* 18* 21* 21*  GLUCOSE 285* 156* 103* 102*  BUN 23* 24* 31* 29*  CREATININE 1.71* 1.68* 2.08* 1.84*  CALCIUM 8.3* 8.5* 9.0 8.3*    Recent Results (from the past 240 hour(s))  Urine culture     Status: Abnormal   Collection Time: 06/18/17 10:24 AM  Result Value Ref Range Status   Specimen Description URINE, CATHETERIZED  Final   Special Requests NONE  Final   Culture >=100,000  COLONIES/mL KLEBSIELLA PNEUMONIAE (A)  Final   Report Status 06/21/2017 FINAL  Final   Organism ID, Bacteria KLEBSIELLA PNEUMONIAE (A)  Final      Susceptibility   Klebsiella pneumoniae - MIC*    AMPICILLIN >=32 RESISTANT Resistant     CEFAZOLIN <=4 SENSITIVE Sensitive     CEFTRIAXONE <=1 SENSITIVE Sensitive     CIPROFLOXACIN <=0.25 SENSITIVE Sensitive     GENTAMICIN <=1 SENSITIVE Sensitive     IMIPENEM <=0.25 SENSITIVE Sensitive     NITROFURANTOIN 64 INTERMEDIATE Intermediate     TRIMETH/SULFA <=20 SENSITIVE Sensitive     AMPICILLIN/SULBACTAM 8 SENSITIVE Sensitive  PIP/TAZO <=4 SENSITIVE Sensitive     Extended ESBL NEGATIVE Sensitive     * >=100,000 COLONIES/mL KLEBSIELLA PNEUMONIAE  Blood Culture (routine x 2)     Status: Abnormal   Collection Time: 06/18/17 10:59 AM  Result Value Ref Range Status   Specimen Description BLOOD RIGHT WRIST  Final   Special Requests   Final    BOTTLES DRAWN AEROBIC ONLY Blood Culture results may not be optimal due to an inadequate volume of blood received in culture bottles   Culture  Setup Time   Final    AEROBIC BOTTLE GRAM NEGATIVE RODS Gram Stain Report Called to,Read Back By and Verified With: LESLORE,C @ 0104 BY MATTHEWS, B 10.5.18 CRITICAL RESULT CALLED TO, READ BACK BY AND VERIFIED WITH: Festus Holts AT 1610 06/19/17 BY L CHAMPION Performed at [facility], [address], [place], [place] [address]    Culture KLEBSIELLA PNEUMONIAE (A)  Final   Report Status 06/21/2017 FINAL  Final   Organism ID, Bacteria KLEBSIELLA PNEUMONIAE  Final      Susceptibility   Klebsiella pneumoniae - MIC*    AMPICILLIN >=32 RESISTANT Resistant     CEFAZOLIN <=4 SENSITIVE Sensitive     CEFEPIME <=1 SENSITIVE Sensitive     CEFTAZIDIME <=1 SENSITIVE Sensitive     CEFTRIAXONE <=1 SENSITIVE Sensitive     CIPROFLOXACIN <=0.25 SENSITIVE Sensitive     GENTAMICIN <=1 SENSITIVE Sensitive     IMIPENEM <=0.25 SENSITIVE Sensitive     TRIMETH/SULFA <=20  SENSITIVE Sensitive     AMPICILLIN/SULBACTAM 8 SENSITIVE Sensitive     PIP/TAZO <=4 SENSITIVE Sensitive     Extended ESBL NEGATIVE Sensitive     * KLEBSIELLA PNEUMONIAE  Blood Culture (routine x 2)     Status: None (Preliminary result)   Collection Time: 06/18/17 10:59 AM  Result Value Ref Range Status   Specimen Description BLOOD LEFT WRIST DRAWN BY RN  Final   Special Requests   Final    BOTTLES DRAWN AEROBIC ONLY Blood Culture results may not be optimal due to an inadequate volume of blood received in culture bottles   Culture NO GROWTH 3 DAYS  Final   Report Status PENDING  Incomplete  Blood Culture ID Panel (Reflexed)     Status: Abnormal   Collection Time: 06/18/17 10:59 AM  Result Value Ref Range Status   Enterococcus species NOT DETECTED NOT DETECTED Final   Listeria monocytogenes NOT DETECTED NOT DETECTED Final   Staphylococcus species NOT DETECTED NOT DETECTED Final   Staphylococcus aureus NOT DETECTED NOT DETECTED Final   Streptococcus species NOT DETECTED NOT DETECTED Final   Streptococcus agalactiae NOT DETECTED NOT DETECTED Final   Streptococcus pneumoniae NOT DETECTED NOT DETECTED Final   Streptococcus pyogenes NOT DETECTED NOT DETECTED Final   Acinetobacter baumannii NOT DETECTED NOT DETECTED Final   Enterobacteriaceae species DETECTED (A) NOT DETECTED Final    Comment: Enterobacteriaceae represent a large family of gram-negative bacteria, not a single organism. CRITICAL RESULT CALLED TO, READ BACK BY AND VERIFIED WITH: M.BULLINS RN 06/19/17 0744 L.CHAMPION    Enterobacter cloacae complex NOT DETECTED NOT DETECTED Final   Escherichia coli NOT DETECTED NOT DETECTED Final   Klebsiella oxytoca NOT DETECTED NOT DETECTED Final   Klebsiella pneumoniae DETECTED (A) NOT DETECTED Final    Comment: CRITICAL RESULT CALLED TO, READ BACK BY AND VERIFIED WITH: M.BULLINS RN 06/19/17 0744 L.CHAMPION    Proteus species NOT DETECTED NOT DETECTED Final   Serratia marcescens NOT  DETECTED  NOT DETECTED Final   Carbapenem resistance NOT DETECTED NOT DETECTED Final   Haemophilus influenzae NOT DETECTED NOT DETECTED Final   Neisseria meningitidis NOT DETECTED NOT DETECTED Final   Pseudomonas aeruginosa NOT DETECTED NOT DETECTED Final   Candida albicans NOT DETECTED NOT DETECTED Final   Candida glabrata NOT DETECTED NOT DETECTED Final   Candida krusei NOT DETECTED NOT DETECTED Final   Candida parapsilosis NOT DETECTED NOT DETECTED Final   Candida tropicalis NOT DETECTED NOT DETECTED Final    Comment: Performed at [facility], [place] [address], [place], [place] [address]     Liver Function Tests:  Recent Labs Lab 06/18/17 1059 06/21/17 0403  AST 51* 13*  ALT 23 14  ALKPHOS 141* 133*  BILITOT 0.8 0.8  PROT 6.3* 5.5*  ALBUMIN 2.9* 2.2*   No results for input(s): LIPASE, AMYLASE in the last 168 hours. No results for input(s): AMMONIA in the last 168 hours.  Cardiac Enzymes: No results for input(s): CKTOTAL, CKMB, CKMBINDEX, TROPONINI in the last 168 hours. BNP (last 3 results) No results for input(s): BNP in the last 8760 hours.  ProBNP (last 3 results) No results for input(s): PROBNP in the last 8760 hours.    Studies: No results found.  Scheduled Meds: . carvedilol  3.125 mg Oral BID WC  . clopidogrel  75 mg Oral Daily  . enoxaparin (LOVENOX) injection  30 mg Subcutaneous Q24H  . insulin aspart  0-15 Units Subcutaneous TID WC  . insulin aspart  0-5 Units Subcutaneous QHS  . insulin detemir  20 Units Subcutaneous QHS  . pantoprazole  40 mg Oral QHS  . potassium chloride  40 mEq Oral Q4H  . simvastatin  20 mg Oral QPM      Time spent: 25 minutes  [facility] Pager [phone]. If 7PM-7AM, please contact night-coverage at [email or web address], Office  [phone]  password [email or web address]  06/21/2017, 10:10 AM  LOS: 3 days

## 2017-06-22 LAB — BASIC METABOLIC PANEL
Anion gap: 9 (ref 5–15)
BUN: 22 mg/dL — AB (ref 6–20)
CALCIUM: 8.3 mg/dL — AB (ref 8.9–10.3)
CO2: 20 mmol/L — ABNORMAL LOW (ref 22–32)
CREATININE: 1.58 mg/dL — AB (ref 0.44–1.00)
Chloride: 110 mmol/L (ref 101–111)
GFR calc non Af Amer: 30 mL/min — ABNORMAL LOW (ref >60)
GFR, EST AFRICAN AMERICAN: 35 mL/min — AB (ref >60)
Glucose, Bld: 82 mg/dL (ref 65–99)
Potassium: 4.3 mmol/L (ref 3.5–5.1)
SODIUM: 139 mmol/L (ref 135–145)

## 2017-06-22 LAB — GLUCOSE, CAPILLARY
Glucose-Capillary: 70 mg/dL (ref 65–99)
Glucose-Capillary: 88 mg/dL (ref 65–99)

## 2017-06-22 LAB — CBC
HCT: 29.4 % — ABNORMAL LOW (ref 36.0–46.0)
HEMOGLOBIN: 10.2 g/dL — AB (ref 12.0–15.0)
MCH: 29.8 pg (ref 26.0–34.0)
MCHC: 34.7 g/dL (ref 30.0–36.0)
MCV: 86 fL (ref 78.0–100.0)
PLATELETS: 191 10*3/uL (ref 150–400)
RBC: 3.42 MIL/uL — AB (ref 3.87–5.11)
RDW: 14.5 % (ref 11.5–15.5)
WBC: 12.5 10*3/uL — ABNORMAL HIGH (ref 4.0–10.5)

## 2017-06-22 MED ORDER — CEPHALEXIN 250 MG PO CAPS: 250.0000 mg | ORAL_CAPSULE | Freq: Three times a day (TID) | ORAL | 0 refills | Status: AC

## 2017-06-22 MED ORDER — INSULIN DETEMIR 100 UNIT/ML ~~LOC~~ SOLN: 20.0000 [IU] | Freq: Every day | SUBCUTANEOUS | 11 refills | Status: DC

## 2017-06-22 NOTE — Progress Notes (Signed)
Discharge instructions given to daughter who verbalized understanding, out in stable condition via w/c with staff.  PICC line removed without difficulty, vaseline gauze and 2x2's applied, pressure held x 2 minutes, secured with tape, instructed to keep dressing in place x 24 hours.

## 2017-06-22 NOTE — Care Management (Signed)
PT recommends HH PT at patient's request. Patient would be appropriate for SNF but declines. She would like [facility]. Left message for Lenice Llamas, who will obtain orders from [facility] when available. Patient and daughter reports she has all necessary DME at home. Daughter plans to transport patient home.

## 2017-06-22 NOTE — Evaluation (Signed)
Physical Therapy Evaluation Patient Details Name: Norma [name] MRN: [id number] DOB: [birth year] Today's Date: 06/22/2017   History of Present Illness   Norma [name]  is a 83 y.o. female, With history of dilated cardiomyopathy, chronic kidney disease stage II, coronary artery disease, uncontrolled type 2 diabetes mellitus, hypertension who was brought to the ED by her family with acute onset of confusion since this morning. Patient was fine until then except for complaining of some abdominal discomfort yesterday. She did not have any fevers, chills, complained of headache, blurred vision, chest pain, shortness of breath, palpitations, dysuria or diarrhea. No joint pain symptoms. No fall or trauma. Did not complain of nausea or vomiting until 2 lips of the vomiting in the ED. No leg swellings, recent travel, recent illness, sick contact or change in her medications. Patient was very confused when presented to the ED and noncommunicative. Her blood glucose was also elevated and was given 30 units of Levemir at home  Clinical Impression  Pt seen for evaluation today.  Pt has been in bed for the past 5 days without mobility.  Pt needed mod to max assist to sit and after five minutes of sitting her dizziness was still increasing and requested to go back to bed.  Therapist recommended SNF but pt states that she will not go.     Follow Up Recommendations Home health PT;SNF (Therapist recommends SNF but pt refuses )    Equipment Recommendations  None recommended by PT    Recommendations for Other Services       Precautions / Restrictions        Mobility  Bed Mobility Overal bed mobility: Needs Assistance Bed Mobility: Sit to Supine;Supine to Sit     Supine to sit: Mod assist;Max assist Sit to supine: Mod assist;Max assist   General bed mobility comments: Pt states family normally helps pt ;  Pt continues to be dizzy after five minutes of sitting up.  Reaquest to lie back  down  Transfers                 General transfer comment: Pt states family normally helps pt but therapist was unable to transfer due to dizziness getting worse and not better   Ambulation/Gait                Stairs            Wheelchair Mobility    Modified Rankin (Stroke Patients Only)       Balance                                             Pertinent Vitals/Pain Pain Assessment: 0-10 Pain Score: 6  (normal state for pt) Pain Location: knees Pain Descriptors / Indicators: Aching Pain Intervention(s): Limited activity within patient's tolerance    Home Living                        Prior Function                 Hand Dominance        Extremity/Trunk Assessment        Lower Extremity Assessment Lower Extremity Assessment: Generalized weakness       Communication      Cognition Arousal/Alertness: Awake/alert Behavior During Therapy: WFL for tasks assessed/performed Overall Cognitive Status: Within Functional  Limits for tasks assessed                                        General Comments      Exercises     Assessment/Plan    PT Assessment Patient needs continued PT services  PT Problem List Decreased strength;Decreased activity tolerance;Decreased mobility;Pain       PT Treatment Interventions Gait training;Therapeutic activities;Therapeutic exercise;Balance training;Functional mobility training    PT Goals (Current goals can be found in the Care Plan section)  Acute Rehab PT Goals PT Goal Formulation: With patient Time For Goal Achievement: 06/24/17 Potential to Achieve Goals: Good    Frequency Min 5X/week   Barriers to discharge        Co-evaluation               AM-PAC PT "6 Clicks" Daily Activity  Outcome Measure Difficulty turning over in bed (including adjusting bedclothes, sheets and blankets)?: A Lot Difficulty moving from lying on back to sitting  on the side of the bed? : A Lot Difficulty sitting down on and standing up from a chair with arms (e.g., wheelchair, bedside commode, etc,.)?: A Lot Help needed moving to and from a bed to chair (including a wheelchair)?: A Lot Help needed walking in hospital room?: A Lot Help needed climbing 3-5 steps with a railing? : Total 6 Click Score: 11    End of Session   Activity Tolerance: Patient limited by fatigue Patient left: in bed   PT Visit Diagnosis: Other abnormalities of gait and mobility (R26.89);Muscle weakness (generalized) (M62.81);Pain Pain - Right/Left: Left Pain - part of body: Knee    Time: 9826-4158 PT Time Calculation (min) (ACUTE ONLY): 34 min   Charges:   PT Evaluation $PT Eval Low Complexity: 1 Low     PT G Codes:   PT G-Codes **NOT FOR INPATIENT CLASS** Functional Assessment Tool Used: AM-PAC 6 Clicks Basic Mobility Functional Limitation: Mobility: Walking and moving around Mobility: Walking and Moving Around Current Status (X0940): At least 60 percent but less than 80 percent impaired, limited or restricted Mobility: Walking and Moving Around Goal Status [phone]): At least 60 percent but less than 80 percent impaired, limited or restricted Mobility: Walking and Moving Around Discharge Status [phone]): At least 60 percent but less than 80 percent impaired, limited or restricted     Norma [name], PT CLT [phone] 06/22/2017, 10:25 AM

## 2017-06-22 NOTE — Care Management Important Message (Signed)
Important Message  Patient Details  Name: Norma [name] MRN: [id number] Date of Birth: May 13, 1934   Medicare Important Message Given:  Yes    Detric Scalisi, Chauncey Reading, RN 06/22/2017, 11:16 AM

## 2017-06-22 NOTE — Progress Notes (Signed)
Pharmacy Antibiotic Note  Norma [name] is a 83 y.o. female admitted on 06/18/2017 with UTI and sepsis.  Pharmacy has been consulted for ZOSYN dosing.  Plan: Zosyn 3.375gm IV q8h, EID F/U cxs and clinical progress  Height: 5\' 2"  (157.5 cm) Weight: 191 lb 12.8 oz (87 kg) IBW/kg (Calculated) : 50.1  Temp (24hrs), Avg:99.3 F (37.4 C), Min:99 F (37.2 C), Max:99.5 F (37.5 C)   Recent Labs Lab 06/18/17 1059 06/18/17 1131 06/18/17 1336 06/18/17 1523 06/18/17 1828 06/19/17 0514 06/20/17 1014 06/21/17 0403 06/22/17 0357  WBC 21.6*  --   --   --   --  32.0* 23.5* 16.5* 12.5*  CREATININE 1.71*  --   --   --   --  1.68* 2.08* 1.84* 1.58*  LATICACIDVEN  --  2.57* 2.76* 1.5 2.8*  --   --   --   --     Estimated Creatinine Clearance: 29.1 mL/min (A) (by C-G formula based on SCr of 1.58 mg/dL (H)).    No Known Allergies  Antimicrobials this admission: Ceftriaxone 10/4 >> 10/5 Zosyn 10/5 >>  Microbiology results: 10/4 BCx: GNR, Klebsiella S to zosyn and rocephin 10/4 UCx: Klebsiella S to zosyn and rocephin  Thank you for allowing pharmacy to be a part of this patient's care.  Hart Robinsons, PharmD Clinical Pharmacist Pager:  [phone] 06/22/2017   06/22/2017 7:47 AM

## 2017-06-22 NOTE — Discharge Summary (Signed)
Physician Discharge Summary  Norma [name] LZJ:[id number] DOB: 29-Nov-1933 DOA: 06/18/2017  PCP: Caryl Bis, MD  Admit date: 06/18/2017 Discharge date: 06/22/2017  Time spent: 35* minutes  Recommendations for Outpatient Follow-up:   Follow up PCP in 2 weeks   Discharge Diagnoses:  Principal Problem:   Sepsis ([place]) Active Problems:   Dilated cardiomyopathy ([place])   Hypotension   Nausea and vomiting   Lower urinary tract infectious disease   Weakness generalized   DM2 (diabetes mellitus, type 2) ([place])   Acute renal failure superimposed on stage 2 chronic kidney disease ([place])   Toxic metabolic encephalopathy   Abdominal pain   Urinary tract infection without hematuria   Discharge Condition: Stable  Diet recommendation:  Carb modified diet  Filed Weights   06/18/17 0957 06/18/17 1425 06/19/17 0538  Weight: 77.1 kg (170 lb) 85.5 kg (188 lb 7.9 oz) 87 kg (191 lb 12.8 oz)    History of present illness:  83 y.o.female,With history of dilated cardiomyopathy, chronic kidney disease stage II, coronary artery disease, uncontrolled type 2 diabetes mellitus, hypertension who was brought to the ED by her family with acute onset of confusion since yesterday morning. Patient was very confused when presented to the ED and noncommunicative. Her blood glucose was also elevated and was given 30 units of Levemir at home.  In the ED patient was septic with tachypnea, hypotensive with blood pressure 96/50 mmHg, afebrile. Blood work showed leukocytosis with WBC of 20 1.6K Glucose of 285, calcium 8.3 and lactic acid of 2.57. Patient given 2 L IV normal saline bolus. Subsequently lactic acid was further elevated to 2.76. UA was suggestive of UTI and chest x-ray was negative for infiltrate, showed bibasilar atelectasis.  Hospital Course:  1. Sepsis due to UTI- resolved,  urine cultureGrew Klebsiella pneumoniae, sensitive to cefazolin and Zosyn. Patient was started on IV Zosyn per pharmacy  consultation on 06/19/17. WBC is down to 12.5. Patient will be discharged on Keflex 250 mg po TID x 10 days.  2. Toxic metabolic encephalopathy- resolved,  This is secondary to dehydration and UTI.  Continue Cymbalta and Klonopin . 3. Hypotension- resolved, secondary to sepsis and dehydration. 4. Hypokalemia- resolved,  potassium is 4.3 today. 5. Diabetes mellitus- continue Levemir. 6. Acute kidney injury superimposed on stage II chronic kidney disease- secondary to  dehydration and sepsis. Creatinine is 1.58 it was 2.08  from yesterday.  7. Abdominal pain-resolved, abdominal x-ray shows no acute abnormality. 8. Coronary artery disease-stable, continue Plavix.  Procedures:  None   Consultations:  None   Discharge Exam: Vitals:   06/21/17 2035 06/22/17 0534  BP:  (!) 159/67  Pulse:  60  Resp:  18  Temp:  99 F (37.2 C)  SpO2: 97% 96%    General: Appears in no acute distress Cardiovascular: S1S2 RRR Respiratory: Clear bilaterally  Discharge Instructions   Discharge Instructions    Diet - low sodium heart healthy    Complete by:  As directed    Increase activity slowly    Complete by:  As directed      Current Discharge Medication List    START taking these medications   Details  cephALEXin (KEFLEX) 250 MG capsule Take 1 capsule (250 mg total) by mouth 3 (three) times daily. Qty: 30 capsule, Refills: 0      CONTINUE these medications which have CHANGED   Details  insulin detemir (LEVEMIR) 100 UNIT/ML injection Inject 0.2 mLs (20 Units total) into the skin at bedtime. Qty:  10 mL, Refills: 11      CONTINUE these medications which have NOT CHANGED   Details  carvedilol (COREG) 12.5 MG tablet Take 12.5 mg by mouth 2 (two) times daily with a meal.    clonazePAM (KLONOPIN) 0.5 MG tablet Take 0.5 mg by mouth 3 (three) times daily as needed for anxiety.     clopidogrel (PLAVIX) 75 MG tablet Take 75 mg by mouth daily.    DULoxetine (CYMBALTA) 60 MG capsule Take 60  mg by mouth 2 (two) times daily.     furosemide (LASIX) 40 MG tablet Take 40 mg by mouth daily as needed for fluid.     lisinopril (PRINIVIL,ZESTRIL) 20 MG tablet Take 1 tablet by mouth daily.    meclizine (ANTIVERT) 25 MG tablet Take 25 mg by mouth 3 (three) times daily as needed for dizziness.    oxyCODONE-acetaminophen (PERCOCET) 10-325 MG tablet Take 1 tablet by mouth 4 (four) times daily.    pantoprazole (PROTONIX) 40 MG tablet Take 1 tablet by mouth at bedtime.     potassium chloride (K-DUR) 10 MEQ tablet Take 1 tablet by mouth daily.     promethazine (PHENERGAN) 25 MG tablet Take 1 tablet by mouth every 4 (four) hours as needed.    simvastatin (ZOCOR) 20 MG tablet Take 20 mg by mouth every evening.        No Known Allergies    The results of significant diagnostics from this hospitalization (including imaging, microbiology, ancillary and laboratory) are listed below for reference.    Significant Diagnostic Studies: Dg Chest Port 1 View  Result Date: 06/18/2017 CLINICAL DATA:  Altered mental status EXAM: PORTABLE CHEST 1 VIEW COMPARISON:  03/17/2017 FINDINGS: Cardiac shadow is stable. Aortic calcifications are again noted. Minimal bibasilar atelectasis is noted. No sizable effusion is seen. No confluent infiltrate is noted. IMPRESSION: Minimal bibasilar atelectasis. Electronically Signed   By: Inez Catalina M.D.   On: 06/18/2017 10:45   Dg Abd Portable 1v  Result Date: 06/18/2017 CLINICAL DATA:  Abdominal pain.  UTI. EXAM: PORTABLE ABDOMEN - 1 VIEW COMPARISON:  PET-CT, 12/08/2012 FINDINGS: There is no bowel dilation to suggest obstruction or generalized adynamic ileus. Mild colonic stool burden. There is surgical vascular clips in the left mid abdomen. Soft tissues otherwise unremarkable. The no acute skeletal abnormality. IMPRESSION: 1. No acute findings.  No evidence of bowel obstruction. Electronically Signed   By: Lajean Manes M.D.   On: 06/18/2017 15:07     Microbiology: Recent Results (from the past 240 hour(s))  Urine culture     Status: Abnormal   Collection Time: 06/18/17 10:24 AM  Result Value Ref Range Status   Specimen Description URINE, CATHETERIZED  Final   Special Requests NONE  Final   Culture >=100,000 COLONIES/mL KLEBSIELLA PNEUMONIAE (A)  Final   Report Status 06/21/2017 FINAL  Final   Organism ID, Bacteria KLEBSIELLA PNEUMONIAE (A)  Final      Susceptibility   Klebsiella pneumoniae - MIC*    AMPICILLIN >=32 RESISTANT Resistant     CEFAZOLIN <=4 SENSITIVE Sensitive     CEFTRIAXONE <=1 SENSITIVE Sensitive     CIPROFLOXACIN <=0.25 SENSITIVE Sensitive     GENTAMICIN <=1 SENSITIVE Sensitive     IMIPENEM <=0.25 SENSITIVE Sensitive     NITROFURANTOIN 64 INTERMEDIATE Intermediate     TRIMETH/SULFA <=20 SENSITIVE Sensitive     AMPICILLIN/SULBACTAM 8 SENSITIVE Sensitive     PIP/TAZO <=4 SENSITIVE Sensitive     Extended ESBL NEGATIVE Sensitive     * >=  100,000 COLONIES/mL KLEBSIELLA PNEUMONIAE  Blood Culture (routine x 2)     Status: Abnormal   Collection Time: 06/18/17 10:59 AM  Result Value Ref Range Status   Specimen Description BLOOD RIGHT WRIST  Final   Special Requests   Final    BOTTLES DRAWN AEROBIC ONLY Blood Culture results may not be optimal due to an inadequate volume of blood received in culture bottles   Culture  Setup Time   Final    AEROBIC BOTTLE GRAM NEGATIVE RODS Gram Stain Report Called to,Read Back By and Verified With: L[place]LORE,C @ 0104 BY MATTHEWS, B 10.5.18 CRITICAL RESULT CALLED TO, READ BACK BY AND VERIFIED WITH: Festus Holts AT 4580 06/19/17 BY L CHAMPION Performed at [facility], [address] [place], [place] [address]    Culture KLEBSIELLA PNEUMONIAE (A)  Final   Report Status 06/21/2017 FINAL  Final   Organism ID, Bacteria KLEBSIELLA PNEUMONIAE  Final      Susceptibility   Klebsiella pneumoniae - MIC*    AMPICILLIN >=32 RESISTANT Resistant     CEFAZOLIN <=4 SENSITIVE Sensitive      CEFEPIME <=1 SENSITIVE Sensitive     CEFTAZIDIME <=1 SENSITIVE Sensitive     CEFTRIAXONE <=1 SENSITIVE Sensitive     CIPROFLOXACIN <=0.25 SENSITIVE Sensitive     GENTAMICIN <=1 SENSITIVE Sensitive     IMIPENEM <=0.25 SENSITIVE Sensitive     TRIMETH/SULFA <=20 SENSITIVE Sensitive     AMPICILLIN/SULBACTAM 8 SENSITIVE Sensitive     PIP/TAZO <=4 SENSITIVE Sensitive     Extended ESBL NEGATIVE Sensitive     * KLEBSIELLA PNEUMONIAE  Blood Culture (routine x 2)     Status: None (Preliminary result)   Collection Time: 06/18/17 10:59 AM  Result Value Ref Range Status   Specimen Description BLOOD LEFT WRIST DRAWN BY RN  Final   Special Requests   Final    BOTTLES DRAWN AEROBIC ONLY Blood Culture results may not be optimal due to an inadequate volume of blood received in culture bottles   Culture NO GROWTH 4 DAYS  Final   Report Status PENDING  Incomplete  Blood Culture ID Panel (Reflexed)     Status: Abnormal   Collection Time: 06/18/17 10:59 AM  Result Value Ref Range Status   Enterococcus species NOT DETECTED NOT DETECTED Final   Listeria monocytogenes NOT DETECTED NOT DETECTED Final   Staphylococcus species NOT DETECTED NOT DETECTED Final   Staphylococcus aureus NOT DETECTED NOT DETECTED Final   Streptococcus species NOT DETECTED NOT DETECTED Final   Streptococcus agalactiae NOT DETECTED NOT DETECTED Final   Streptococcus pneumoniae NOT DETECTED NOT DETECTED Final   Streptococcus pyogenes NOT DETECTED NOT DETECTED Final   Acinetobacter baumannii NOT DETECTED NOT DETECTED Final   Enterobacteriaceae species DETECTED (A) NOT DETECTED Final    Comment: Enterobacteriaceae represent a large family of gram-negative bacteria, not a single organism. CRITICAL RESULT CALLED TO, READ BACK BY AND VERIFIED WITH: M.BULLINS RN 06/19/17 0744 L.CHAMPION    Enterobacter cloacae complex NOT DETECTED NOT DETECTED Final   Escherichia coli NOT DETECTED NOT DETECTED Final   Klebsiella oxytoca NOT DETECTED  NOT DETECTED Final   Klebsiella pneumoniae DETECTED (A) NOT DETECTED Final    Comment: CRITICAL RESULT CALLED TO, READ BACK BY AND VERIFIED WITH: M.BULLINS RN 06/19/17 0744 L.CHAMPION    Proteus species NOT DETECTED NOT DETECTED Final   Serratia marcescens NOT DETECTED NOT DETECTED Final   Carbapenem resistance NOT DETECTED NOT DETECTED Final   Haemophilus influenzae NOT  DETECTED NOT DETECTED Final   Neisseria meningitidis NOT DETECTED NOT DETECTED Final   Pseudomonas aeruginosa NOT DETECTED NOT DETECTED Final   Candida albicans NOT DETECTED NOT DETECTED Final   Candida glabrata NOT DETECTED NOT DETECTED Final   Candida krusei NOT DETECTED NOT DETECTED Final   Candida parapsilosis NOT DETECTED NOT DETECTED Final   Candida tropicalis NOT DETECTED NOT DETECTED Final    Comment: Performed at [facility], [place] [address], [place], [place] [address]     Labs: Basic Metabolic Panel:  Recent Labs Lab 06/18/17 1059 06/19/17 0514 06/20/17 1014 06/21/17 0403 06/22/17 0357  NA 132* 135 138 138 139  K 4.7 4.0 3.5 3.2* 4.3  CL 102 107 108 110 110  CO2 21* 18* 21* 21* 20*  GLUCOSE 285* 156* 103* 102* 82  BUN 23* 24* 31* 29* 22*  CREATININE 1.71* 1.68* 2.08* 1.84* 1.58*  CALCIUM 8.3* 8.5* 9.0 8.3* 8.3*   Liver Function Tests:  Recent Labs Lab 06/18/17 1059 06/21/17 0403  AST 51* 13*  ALT 23 14  ALKPHOS 141* 133*  BILITOT 0.8 0.8  PROT 6.3* 5.5*  ALBUMIN 2.9* 2.2*   No results for input(s): LIPASE, AMYLASE in the last 168 hours. No results for input(s): AMMONIA in the last 168 hours. CBC:  Recent Labs Lab 06/18/17 1059 06/19/17 0514 06/20/17 1014 06/21/17 0403 06/22/17 0357  WBC 21.6* 32.0* 23.5* 16.5* 12.5*  NEUTROABS 18.4*  --   --   --   --   HGB 11.8* 10.1* 10.6* 9.9* 10.2*  HCT 36.8 30.7* 31.6* 29.5* 29.4*  MCV 89.5 88.2 85.6 86.3 86.0  PLT 190 201 203 207 191    CBG:  Recent Labs Lab 06/21/17 1129 06/21/17 1634 06/21/17 2050 06/22/17 0804  06/22/17 1122  GLUCAP 73 95 144* 70 88       Signed:  Elvenia Godden S MD.  [facility] 06/22/2017, 12:36 PM

## 2017-06-23 LAB — CULTURE, BLOOD (ROUTINE X 2): Culture: NO GROWTH

## 2017-07-26 ENCOUNTER — Emergency Department (HOSPITAL_COMMUNITY): Payer: Medicare PPO

## 2017-07-26 ENCOUNTER — Encounter (HOSPITAL_COMMUNITY): Payer: Self-pay

## 2017-07-26 ENCOUNTER — Other Ambulatory Visit: Payer: Self-pay

## 2017-07-26 ENCOUNTER — Inpatient Hospital Stay (HOSPITAL_COMMUNITY)
Admission: EM | Admit: 2017-07-26 | Discharge: 2017-07-29 | DRG: 871 | Disposition: A | Discharge status: Home Health | Payer: Medicare PPO | Attending: Internal Medicine | Admitting: Internal Medicine

## 2017-07-26 ENCOUNTER — Inpatient Hospital Stay (HOSPITAL_COMMUNITY): Payer: Medicare PPO

## 2017-07-26 DIAGNOSIS — I420 Dilated cardiomyopathy: Secondary | ICD-10-CM | POA: Diagnosis present

## 2017-07-26 DIAGNOSIS — I2789 Other specified pulmonary heart diseases: Secondary | ICD-10-CM | POA: Diagnosis present

## 2017-07-26 DIAGNOSIS — R160 Hepatomegaly, not elsewhere classified: Secondary | ICD-10-CM | POA: Diagnosis present

## 2017-07-26 DIAGNOSIS — G92 Toxic encephalopathy: Secondary | ICD-10-CM | POA: Diagnosis present

## 2017-07-26 DIAGNOSIS — Z66 Do not resuscitate: Secondary | ICD-10-CM | POA: Diagnosis present

## 2017-07-26 DIAGNOSIS — Z87440 Personal history of urinary (tract) infections: Secondary | ICD-10-CM

## 2017-07-26 DIAGNOSIS — A4159 Other Gram-negative sepsis: Principal | ICD-10-CM | POA: Diagnosis present

## 2017-07-26 DIAGNOSIS — R319 Hematuria, unspecified: Secondary | ICD-10-CM

## 2017-07-26 DIAGNOSIS — A419 Sepsis, unspecified organism: Secondary | ICD-10-CM

## 2017-07-26 DIAGNOSIS — R7881 Bacteremia: Secondary | ICD-10-CM | POA: Diagnosis not present

## 2017-07-26 DIAGNOSIS — Z90710 Acquired absence of both cervix and uterus: Secondary | ICD-10-CM

## 2017-07-26 DIAGNOSIS — G9341 Metabolic encephalopathy: Secondary | ICD-10-CM

## 2017-07-26 DIAGNOSIS — G894 Chronic pain syndrome: Secondary | ICD-10-CM | POA: Diagnosis present

## 2017-07-26 DIAGNOSIS — I2510 Atherosclerotic heart disease of native coronary artery without angina pectoris: Secondary | ICD-10-CM | POA: Diagnosis present

## 2017-07-26 DIAGNOSIS — D631 Anemia in chronic kidney disease: Secondary | ICD-10-CM | POA: Diagnosis present

## 2017-07-26 DIAGNOSIS — E785 Hyperlipidemia, unspecified: Secondary | ICD-10-CM | POA: Diagnosis present

## 2017-07-26 DIAGNOSIS — I130 Hypertensive heart and chronic kidney disease with heart failure and stage 1 through stage 4 chronic kidney disease, or unspecified chronic kidney disease: Secondary | ICD-10-CM | POA: Diagnosis present

## 2017-07-26 DIAGNOSIS — N136 Pyonephrosis: Secondary | ICD-10-CM | POA: Diagnosis present

## 2017-07-26 DIAGNOSIS — R4182 Altered mental status, unspecified: Secondary | ICD-10-CM

## 2017-07-26 DIAGNOSIS — N10 Acute tubulo-interstitial nephritis: Secondary | ICD-10-CM | POA: Diagnosis present

## 2017-07-26 DIAGNOSIS — N179 Acute kidney failure, unspecified: Secondary | ICD-10-CM | POA: Diagnosis present

## 2017-07-26 DIAGNOSIS — E1165 Type 2 diabetes mellitus with hyperglycemia: Secondary | ICD-10-CM | POA: Diagnosis present

## 2017-07-26 DIAGNOSIS — G928 Other toxic encephalopathy: Secondary | ICD-10-CM | POA: Diagnosis present

## 2017-07-26 DIAGNOSIS — Z9049 Acquired absence of other specified parts of digestive tract: Secondary | ICD-10-CM

## 2017-07-26 DIAGNOSIS — A414 Sepsis due to anaerobes: Secondary | ICD-10-CM

## 2017-07-26 DIAGNOSIS — N183 Chronic kidney disease, stage 3 unspecified: Secondary | ICD-10-CM

## 2017-07-26 DIAGNOSIS — Z79899 Other long term (current) drug therapy: Secondary | ICD-10-CM

## 2017-07-26 DIAGNOSIS — R911 Solitary pulmonary nodule: Secondary | ICD-10-CM | POA: Diagnosis present

## 2017-07-26 DIAGNOSIS — I5022 Chronic systolic (congestive) heart failure: Secondary | ICD-10-CM | POA: Diagnosis present

## 2017-07-26 DIAGNOSIS — Z7902 Long term (current) use of antithrombotics/antiplatelets: Secondary | ICD-10-CM

## 2017-07-26 DIAGNOSIS — B961 Klebsiella pneumoniae [K. pneumoniae] as the cause of diseases classified elsewhere: Secondary | ICD-10-CM | POA: Diagnosis present

## 2017-07-26 DIAGNOSIS — I340 Nonrheumatic mitral (valve) insufficiency: Secondary | ICD-10-CM | POA: Diagnosis not present

## 2017-07-26 DIAGNOSIS — Z794 Long term (current) use of insulin: Secondary | ICD-10-CM

## 2017-07-26 DIAGNOSIS — I272 Pulmonary hypertension, unspecified: Secondary | ICD-10-CM | POA: Diagnosis present

## 2017-07-26 DIAGNOSIS — Z8249 Family history of ischemic heart disease and other diseases of the circulatory system: Secondary | ICD-10-CM | POA: Diagnosis not present

## 2017-07-26 DIAGNOSIS — E1122 Type 2 diabetes mellitus with diabetic chronic kidney disease: Secondary | ICD-10-CM | POA: Diagnosis present

## 2017-07-26 DIAGNOSIS — N390 Urinary tract infection, site not specified: Secondary | ICD-10-CM

## 2017-07-26 DIAGNOSIS — N1330 Unspecified hydronephrosis: Secondary | ICD-10-CM | POA: Diagnosis not present

## 2017-07-26 DIAGNOSIS — Z8673 Personal history of transient ischemic attack (TIA), and cerebral infarction without residual deficits: Secondary | ICD-10-CM

## 2017-07-26 LAB — TROPONIN I: Troponin I: <0.03 ng/mL (ref <0.03)

## 2017-07-26 LAB — I-STAT CG4 LACTIC ACID, ED
Lactic Acid, Venous: 0.91 mmol/L (ref 0.5–1.9)
Lactic Acid, Venous: 1.12 mmol/L (ref 0.5–1.9)

## 2017-07-26 LAB — PROTIME-INR
INR: 1.11
INR: 1.11
Prothrombin Time: 14.2 seconds (ref 11.4–15.2)
Prothrombin Time: 14.3 seconds (ref 11.4–15.2)

## 2017-07-26 LAB — URINALYSIS, ROUTINE W REFLEX MICROSCOPIC
BILIRUBIN URINE: NEGATIVE
GLUCOSE, UA: NEGATIVE mg/dL
Ketones, ur: NEGATIVE mg/dL
NITRITE: NEGATIVE
Protein, ur: 100 mg/dL — AB
Specific Gravity, Urine: 1.012 (ref 1.005–1.030)
pH: 5 (ref 5.0–8.0)

## 2017-07-26 LAB — CBC WITH DIFFERENTIAL/PLATELET
Basophils Absolute: 0 10*3/uL (ref 0.0–0.1)
Basophils Relative: 0 %
EOS ABS: 0 10*3/uL (ref 0.0–0.7)
Eosinophils Relative: 0 %
HCT: 30.7 % — ABNORMAL LOW (ref 36.0–46.0)
HEMOGLOBIN: 10 g/dL — AB (ref 12.0–15.0)
LYMPHS ABS: 2.1 10*3/uL (ref 0.7–4.0)
LYMPHS PCT: 8 %
MCH: 28.9 pg (ref 26.0–34.0)
MCHC: 32.6 g/dL (ref 30.0–36.0)
MCV: 88.7 fL (ref 78.0–100.0)
Monocytes Absolute: 2.6 10*3/uL — ABNORMAL HIGH (ref 0.1–1.0)
Monocytes Relative: 10 %
NEUTROS ABS: 20.6 10*3/uL — AB (ref 1.7–7.7)
NEUTROS PCT: 82 %
Platelets: 261 10*3/uL (ref 150–400)
RBC: 3.46 MIL/uL — AB (ref 3.87–5.11)
RDW: 14.5 % (ref 11.5–15.5)
WBC: 25.3 10*3/uL — AB (ref 4.0–10.5)

## 2017-07-26 LAB — COMPREHENSIVE METABOLIC PANEL
ALT: 17 U/L (ref 14–54)
ANION GAP: 10 (ref 5–15)
AST: 38 U/L (ref 15–41)
Albumin: 2.9 g/dL — ABNORMAL LOW (ref 3.5–5.0)
Alkaline Phosphatase: 155 U/L — ABNORMAL HIGH (ref 38–126)
BUN: 28 mg/dL — ABNORMAL HIGH (ref 6–20)
CALCIUM: 8.4 mg/dL — AB (ref 8.9–10.3)
CHLORIDE: 100 mmol/L — AB (ref 101–111)
CO2: 21 mmol/L — ABNORMAL LOW (ref 22–32)
Creatinine, Ser: 2.1 mg/dL — ABNORMAL HIGH (ref 0.44–1.00)
GFR calc non Af Amer: 21 mL/min — ABNORMAL LOW (ref >60)
GFR, EST AFRICAN AMERICAN: 24 mL/min — AB (ref >60)
Glucose, Bld: 344 mg/dL — ABNORMAL HIGH (ref 65–99)
POTASSIUM: 4.2 mmol/L (ref 3.5–5.1)
SODIUM: 131 mmol/L — AB (ref 135–145)
Total Bilirubin: 1.4 mg/dL — ABNORMAL HIGH (ref 0.3–1.2)
Total Protein: 6.8 g/dL (ref 6.5–8.1)

## 2017-07-26 LAB — GLUCOSE, CAPILLARY
GLUCOSE-CAPILLARY: 302 mg/dL — AB (ref 65–99)
GLUCOSE-CAPILLARY: 232 mg/dL — AB (ref 65–99)

## 2017-07-26 LAB — MRSA PCR SCREENING: MRSA by PCR: NEGATIVE

## 2017-07-26 LAB — LIPASE, BLOOD: Lipase: 16 U/L (ref 11–51)

## 2017-07-26 LAB — APTT: aPTT: 39 seconds — ABNORMAL HIGH (ref 24–36)

## 2017-07-26 LAB — LACTIC ACID, PLASMA: LACTIC ACID, VENOUS: 1.2 mmol/L (ref 0.5–1.9)

## 2017-07-26 LAB — PROCALCITONIN: Procalcitonin: 3.24 ng/mL

## 2017-07-26 MED ORDER — CLOPIDOGREL BISULFATE 75 MG PO TABS
75.0000 mg | ORAL_TABLET | Freq: Every day | ORAL | Status: DC
Start: 2017-07-27 — End: 2017-07-29
  Administered 2017-07-27 – 2017-07-29 (×3): 75 mg via ORAL
  Filled 2017-07-26 (×3): qty 1

## 2017-07-26 MED ORDER — DEXTROSE 5 % IV SOLN
2.0000 g | Freq: Once | INTRAVENOUS | Status: AC
  Administered 2017-07-26: 2 g via INTRAVENOUS
  Filled 2017-07-26: qty 2

## 2017-07-26 MED ORDER — INSULIN ASPART 100 UNIT/ML ~~LOC~~ SOLN
0.0000 [IU] | Freq: Every day | SUBCUTANEOUS | Status: DC
  Administered 2017-07-26: 2 [IU] via SUBCUTANEOUS
  Administered 2017-07-27: 3 [IU] via SUBCUTANEOUS

## 2017-07-26 MED ORDER — SIMVASTATIN 20 MG PO TABS
20.0000 mg | ORAL_TABLET | Freq: Every evening | ORAL | Status: DC
  Administered 2017-07-26 – 2017-07-28 (×3): 20 mg via ORAL
  Filled 2017-07-26 (×3): qty 1

## 2017-07-26 MED ORDER — DEXTROSE 5 % IV SOLN
2.0000 g | INTRAVENOUS | Status: DC
  Administered 2017-07-27: 2 g via INTRAVENOUS
  Filled 2017-07-26 (×3): qty 2

## 2017-07-26 MED ORDER — POTASSIUM CHLORIDE IN NACL 20-0.9 MEQ/L-% IV SOLN
INTRAVENOUS | Status: DC
  Administered 2017-07-26 – 2017-07-27 (×2): via INTRAVENOUS

## 2017-07-26 MED ORDER — IOPAMIDOL (ISOVUE-300) INJECTION 61%
INTRAVENOUS | Status: AC
  Filled 2017-07-26: qty 30

## 2017-07-26 MED ORDER — SODIUM CHLORIDE 0.9 % IV BOLUS (SEPSIS)
1000.0000 mL | Freq: Once | INTRAVENOUS | Status: AC
  Administered 2017-07-26: 1000 mL via INTRAVENOUS

## 2017-07-26 MED ORDER — PANTOPRAZOLE SODIUM 40 MG PO TBEC
40.0000 mg | DELAYED_RELEASE_TABLET | Freq: Every day | ORAL | Status: DC
  Administered 2017-07-26 – 2017-07-28 (×3): 40 mg via ORAL
  Filled 2017-07-26 (×3): qty 1

## 2017-07-26 MED ORDER — OXYCODONE HCL 5 MG PO TABS
5.0000 mg | ORAL_TABLET | Freq: Four times a day (QID) | ORAL | Status: DC
  Administered 2017-07-26 – 2017-07-29 (×11): 5 mg via ORAL
  Filled 2017-07-26 (×11): qty 1

## 2017-07-26 MED ORDER — SODIUM CHLORIDE 0.9 % IV BOLUS (SEPSIS)
1000.0000 mL | Freq: Once | INTRAVENOUS | Status: AC
Start: 2017-07-26 — End: 2017-07-26
  Administered 2017-07-26: 1000 mL via INTRAVENOUS

## 2017-07-26 MED ORDER — VANCOMYCIN HCL IN DEXTROSE 1-5 GM/200ML-% IV SOLN
1000.0000 mg | Freq: Once | INTRAVENOUS | Status: AC
  Administered 2017-07-26: 1000 mg via INTRAVENOUS
  Filled 2017-07-26: qty 200

## 2017-07-26 MED ORDER — ACETAMINOPHEN 650 MG RE SUPP: 650.0000 mg | Freq: Four times a day (QID) | RECTAL | Status: DC | PRN

## 2017-07-26 MED ORDER — HEPARIN SODIUM (PORCINE) 5000 UNIT/ML IJ SOLN
5000.0000 [IU] | Freq: Three times a day (TID) | INTRAMUSCULAR | Status: DC
  Administered 2017-07-26 – 2017-07-29 (×9): 5000 [IU] via SUBCUTANEOUS
  Filled 2017-07-26 (×9): qty 1

## 2017-07-26 MED ORDER — INSULIN DETEMIR 100 UNIT/ML ~~LOC~~ SOLN
10.0000 [IU] | Freq: Every day | SUBCUTANEOUS | Status: DC
  Administered 2017-07-26: 10 [IU] via SUBCUTANEOUS
  Filled 2017-07-26 (×2): qty 0.1

## 2017-07-26 MED ORDER — ACETAMINOPHEN 325 MG PO TABS: 650.0000 mg | ORAL_TABLET | Freq: Four times a day (QID) | ORAL | Status: DC | PRN

## 2017-07-26 MED ORDER — OXYCODONE-ACETAMINOPHEN 10-325 MG PO TABS: 1.0000 | ORAL_TABLET | Freq: Four times a day (QID) | ORAL | Status: DC

## 2017-07-26 MED ORDER — INSULIN ASPART 100 UNIT/ML ~~LOC~~ SOLN
0.0000 [IU] | Freq: Three times a day (TID) | SUBCUTANEOUS | Status: DC
  Administered 2017-07-26: 11 [IU] via SUBCUTANEOUS
  Administered 2017-07-27 – 2017-07-28 (×4): 5 [IU] via SUBCUTANEOUS
  Administered 2017-07-28: 3 [IU] via SUBCUTANEOUS
  Administered 2017-07-28: 5 [IU] via SUBCUTANEOUS
  Administered 2017-07-29: 3 [IU] via SUBCUTANEOUS

## 2017-07-26 MED ORDER — VANCOMYCIN HCL IN DEXTROSE 750-5 MG/150ML-% IV SOLN
750.0000 mg | INTRAVENOUS | Status: DC
  Administered 2017-07-27: 750 mg via INTRAVENOUS
  Filled 2017-07-26 (×2): qty 150

## 2017-07-26 MED ORDER — OXYCODONE-ACETAMINOPHEN 5-325 MG PO TABS
1.0000 | ORAL_TABLET | Freq: Four times a day (QID) | ORAL | Status: DC
  Administered 2017-07-26 – 2017-07-29 (×11): 1 via ORAL
  Filled 2017-07-26 (×11): qty 1

## 2017-07-26 MED ORDER — DULOXETINE HCL 60 MG PO CPEP
60.0000 mg | ORAL_CAPSULE | Freq: Two times a day (BID) | ORAL | Status: DC
  Administered 2017-07-26 – 2017-07-29 (×6): 60 mg via ORAL
  Filled 2017-07-26 (×6): qty 1

## 2017-07-26 NOTE — ED Triage Notes (Signed)
EMS reports pt lives at home with family.  Reports decreased appetite since yesterday, fever, generalized weakness, and strong smell to urine.  BP 82/51 with ems, increased to 104/51 with ivf.  02 sat 95% on room air, cbg 429.

## 2017-07-26 NOTE — ED Provider Notes (Signed)
Emergency Department Provider Note   I have reviewed the triage vital signs and the nursing notes.   HISTORY  Chief Complaint Fever and Urinary Tract Infection   HPI Norma [name] is a 83 y.o. female history of heart failure, anemia, chronic kidney disease, diabetes and urinary tract infections presents to the emergency department today with decreased mental status.  Says that she had a fever at home of 100.6 as well.  Family states over the last 24-36 hours she had decreased p.o. intake and has also had decreased energy and has been "lethargic".  States this is similar to how she presented when she had GI a few weeks ago.  This morning she has significant weakness which she could get around so EMS was called to bring her in for evaluation and she was initially hypotensive at 80s over 40s but after fluids this improved 259D systolic.  On arrival here patient is without complaint besides decreased appetite and weakness.  She is having headache, chest pain, congestion, cough, shortness of breath, abdominal pain or urinary symptoms.  No recent illnesses aside from the recent hospitalization.  No other sick contacts.  No recent travels.  No other associated or modifying symptoms.   Past Medical History:  Diagnosis Date  . Acute systolic heart failure ([place])   . Anemia of other chronic disease   . Chronic kidney disease, unspecified   . Chronic pain syndrome   . Congestive heart failure, unspecified   . Coronary atherosclerosis of native coronary artery   . History of blood transfusion 1960's   "when my last child was born" (12/23/2012)  . Hypoxemia   . Other chronic pulmonary heart diseases   . Other primary cardiomyopathies   . Other specified forms of chronic ischemic heart disease   . Pneumonia    "when I was little" (12/23/2012)  . Stroke [facility]) ~ 2011   "slowed my walking" (12/23/2012)  . Type II diabetes mellitus ([place])   . Unspecified hypertensive kidney disease with chronic  kidney disease stage I through stage IV, or unspecified(403.90)   . Unspecified pleural effusion     Patient Active Problem List   Diagnosis Date Noted  . Sepsis ([place]) 06/18/2017  . Acute renal failure superimposed on stage 2 chronic kidney disease ([place]) 06/18/2017  . Toxic metabolic encephalopathy 63/87/5643  . Abdominal pain   . Urinary tract infection without hematuria   . Lower urinary tract infectious disease 02/26/2016  . Weakness generalized 02/26/2016  . Weakness 02/26/2016  . DM2 (diabetes mellitus, type 2) ([place]) 02/26/2016  . Hemoptysis 12/24/2012  . Dyspnea on exertion 03/08/2012  . Palpitations 02/05/2012  . Pulmonary nodule 02/05/2012  . Nausea and vomiting 01/03/2011  . Dilated cardiomyopathy ([place]) 12/04/2010  . Acute on chronic congestive heart failure ([place]) 12/04/2010  . Abnormal EKG 12/04/2010  . Pulmonary hypertension ([place]) 12/04/2010  . Hypotension 12/04/2010  . Chronic kidney disease (CKD), stage III (moderate) ([place]) 12/04/2010  . CAD (coronary artery disease) 12/04/2010    Past Surgical History:  Procedure Laterality Date  . ABDOMINAL HYSTERECTOMY    . APPENDECTOMY    . CATARACT EXTRACTION, BILATERAL    . CHOLECYSTECTOMY    . DILATION AND CURETTAGE OF UTERUS    . PATELLA RECONSTRUCTION Bilateral    "had my knee caps replaced" (12/23/2012)  . TONSILLECTOMY      Current Outpatient Rx  . Order #: [id number] Class: Historical Med  . Order #: [id number] Class: Historical Med  . Order #: [id number] Class: Historical  Med  . Order #: [id number] Class: Historical Med  . Order #: [id number] Class: Historical Med  . Order #: [id number] Class: Normal  . Order #: [id number] Class: Historical Med  . Order #: [id number] Class: Historical Med  . Order #: [id number] Class: Historical Med  . Order #: [id number] Class: Historical Med  . Order #: [id number] Class: Historical Med  . Order #: [id number] Class: Historical Med  . Order #: [id number] Class: Historical Med    Allergies Patient  has no known allergies.  Family History  Problem Relation Age of Onset  . Heart attack Father 9       MI  . Cancer Father        leukemia  . Cancer Brother   . Heart disease Daughter     Social History Social History   Tobacco Use  . Smoking status: Never Smoker  . Smokeless tobacco: Never Used  Substance Use Topics  . Alcohol use: No  . Drug use: No    Review of Systems  All other systems negative except as documented in the HPI. All pertinent positives and negatives as reviewed in the HPI. ____________________________________________   PHYSICAL EXAM:  VITAL SIGNS: ED Triage Vitals  Enc Vitals Group     BP 07/26/17 1204 (!) 91/47     Pulse Rate 07/26/17 1204 81     Resp 07/26/17 1204 15     Temp --      Temp src --      SpO2 07/26/17 1204 100 %     Weight 07/26/17 1111 191 lb (86.6 kg)     Height --      Head Circumference --      Peak Flow --      Pain Score --      Pain Loc --      Pain Edu? --      Excl. in [place]? --     Constitutional: Alert and oriented to self, place and situation. Not oriented to year, month, date but knows it is Sunday. Pale skin, not well appearing. Eyes: Conjunctivae are normal. PERRL. EOMI. Eyes are sunken Head: Atraumatic. Nose: No congestion/rhinnorhea. Mouth/Throat: Mucous membranes are moist.  Oropharynx non-erythematous. Neck: No stridor.  No meningeal signs.   Cardiovascular: Normal rate, regular rhythm. Good peripheral circulation. Grossly normal heart sounds.   Respiratory: Normal respiratory effort.  No retractions. Lungs CTAB. Gastrointestinal: Soft and nontender. No distention.  Musculoskeletal: No lower extremity tenderness nor edema. No gross deformities of extremities. Neurologic:  Normal speech and language. No gross focal neurologic deficits are appreciated.  Skin:  Skin is warm, dry and intact. No rash noted.  ____________________________________________   LABS (all labs ordered are listed, but only abnormal  results are displayed)  Labs Reviewed  COMPREHENSIVE METABOLIC PANEL - Abnormal; Notable for the following components:      Result Value   Sodium 131 (*)    Chloride 100 (*)    CO2 21 (*)    Glucose, Bld 344 (*)    BUN 28 (*)    Creatinine, Ser 2.10 (*)    Calcium 8.4 (*)    Albumin 2.9 (*)    Alkaline Phosphatase 155 (*)    Total Bilirubin 1.4 (*)    GFR calc non Af Amer 21 (*)    GFR calc Af Amer 24 (*)    All other components within normal limits  CBC WITH DIFFERENTIAL/PLATELET - Abnormal; Notable for the following components:   WBC 25.3 (*)    RBC 3.46 (*)  Hemoglobin 10.0 (*)    HCT 30.7 (*)    Neutro Abs 20.6 (*)    Monocytes Absolute 2.6 (*)    All other components within normal limits  URINALYSIS, ROUTINE W REFLEX MICROSCOPIC - Abnormal; Notable for the following components:   APPearance CLOUDY (*)    Hgb urine dipstick MODERATE (*)    Protein, ur 100 (*)    Leukocytes, UA MODERATE (*)    Bacteria, UA MANY (*)    Squamous Epithelial / LPF 0-5 (*)    All other components within normal limits  CULTURE, BLOOD (ROUTINE X 2)  CULTURE, BLOOD (ROUTINE X 2)  URINE CULTURE  PROTIME-INR  TROPONIN I  I-STAT CG4 LACTIC ACID, ED  I-STAT CG4 LACTIC ACID, ED   ____________________________________________  EKG   EKG Interpretation  Date/Time:  Sunday July 26 2017 11:19:16 EST Ventricular Rate:  87 PR Interval:    QRS Duration: 102 QT Interval:  392 QTC Calculation: 472 R Axis:   -5 Text Interpretation:  Sinus rhythm Left ventricular hypertrophy Confirmed by Merrily Pew [phone]) on 07/26/2017 12:49:28 PM       ____________________________________________  RADIOLOGY  Dg Chest Port 1 View  Result Date: 07/26/2017 CLINICAL DATA:  Sepsis. EXAM: PORTABLE CHEST 1 VIEW COMPARISON:  06/18/2017 FINDINGS: Cardiomediastinal silhouette is normal. Mediastinal contours appear intact. Calcific atherosclerotic disease and tortuosity of the aorta. There is no evidence  of focal airspace consolidation, pleural effusion or pneumothorax. Diffusely increased interstitial markings. Minimal peribronchial airspace opacities in the right lower lobe. Osseous structures are without acute abnormality. Soft tissues are grossly normal. IMPRESSION: Minimal peribronchial airspace opacities in the right lower lobe may represent developing bronchopneumonia or areas of atelectasis. Diffuse mildly increased interstitial markings. Electronically Signed   By: Fidela Salisbury M.D.   On: 07/26/2017 13:51    ____________________________________________   PROCEDURES  Procedure(s) performed:   Procedures   ____________________________________________   INITIAL IMPRESSION / ASSESSMENT AND PLAN / ED COURSE  Pertinent labs & imaging results that were available during my care of the patient were reviewed by me and considered in my medical decision making (see chart for details).  Initial blood pressure here is also slightly soft so we will start with fluids.  No tachycardia or other complaints at this time so we will hold on antibiotics to get more confirmation this could be sepsis.  Could also consider possible primary cardiac cause that she does have a story of heart failure so we will evaluate that as well.  On further evaluation her white blood cell count came back at 25.8 and her blood pressure is now 91/47.  Will cover her broadly for possible sepsis with vanc/cefepime.  Patient with likely UTI and questionable pneumonia.  Patient color has improved significantly with fluids and antibiotics.  Secondary to initial low blood pressures white blood cell count and being 83 year old old with altered mental status, I will talk to the hospitalist about admission.  ____________________________________________  FINAL CLINICAL IMPRESSION(S) / ED DIAGNOSES  Final diagnoses:  Urinary tract infection without hematuria, site unspecified  Sepsis, due to unspecified organism [facility])      MEDICATIONS GIVEN DURING THIS VISIT:  Medications  vancomycin (VANCOCIN) IVPB 1000 mg/200 mL premix (1,000 mg Intravenous New Bag/Given 07/26/17 1405)  sodium chloride 0.9 % bolus 1,000 mL (0 mLs Intravenous Stopped 07/26/17 1319)  ceFEPIme (MAXIPIME) 2 g in dextrose 5 % 50 mL IVPB (0 g Intravenous Stopped 07/26/17 1405)  sodium chloride 0.9 % bolus 1,000 mL (1,000 mLs  Intravenous New Bag/Given 07/26/17 1331)     NEW OUTPATIENT MEDICATIONS STARTED DURING THIS VISIT:  This SmartLink is deprecated. Use AVSMEDLIST instead to display the medication list for a patient.  Note:  This document was prepared using Dragon voice recognition software and may include unintentional dictation errors.   Merrily Pew, MD 07/26/17 1434

## 2017-07-26 NOTE — ED Notes (Signed)
Second PIV attempted 3 times, all unsuccessful at this time.

## 2017-07-26 NOTE — H&P (Signed)
History and Physical  [name]:[id number] DOB: 08/20/1934 DOA: 07/26/2017   PCP: Caryl Bis, MD   Patient coming from: Home  Chief Complaint: confusion and generalized weakness  HPI:  Norma [name] is a 83 y.o. female with medical history of diabetes mellitus, CKD stage III, cardiomyopathy, hypertension, and chronic pain syndrome presenting with 2-day history of "not acting like herself" and decreased oral intake.  Her symptoms have progressed to the point where she had difficulty getting out of bed on the morning of July 26, 2017.  In addition, her head continue to worsen.  As result, the patient was brought to the emergency department for further evaluation.  The patient is pleasantly confused, but she is able to answer simple questions.  She denies any chest pain, shortness breath, coughing, hemoptysis, nausea, vomiting, diarrhea, dysuria, hematuria, hematochezia, melena.  Upon EMS arrival, the patient was noted to be hypotensive with a blood pressure of 80/50.  The patient has been complaining of intermittent abdominal pain for 2-3 weeks.  Her abdominal pain is worse in the upper abdomen--epigastric and right upper quadrant.  Her abdominal pain is intermittently worse with eating.  She denies any new medications.  She denies any recent travels or eating any unusual or undercooked foods.  The patient was recently discharged from the hospital after a stay from June 18, 2017 through June 22, 2017 when she was treated for sepsis secondary to UTI and Klebsiella bacteremia.  The patient was discharged home with cephalexin for 10 days. In the emergency department, the patient had a temperature of 100.1 F with soft blood pressures in the 90s.  Patient was fluid resuscitated with improvement of her blood pressure to 124/59.  The patient was saturating 100% on room air.  BMP showed a sodium 131 with a glucose 344 and serum creatinine 2.10.  WBC was 25.3.  Chest x-ray showed  chronic interstitial markings with minimal peribronchial prominence.  Urinalysis showed TNTC WBC.  Lactic acid was 0.91.  EKG shows sinus rhythm with nonspecific T wave changes.  Assessment/Plan: Sepsis -Likely due to a urinary source, but cannot rule out bacteremia or intra-abdominal source -Continue vancomycin and ceftriaxone pending culture data -Lactic acid 0.91 -Check procalcitonin -Continue IV fluids  UTI -Continue ceftriaxone pending culture data  Abdominal pain -Check lipase -CT abdomen and pelvis  Acute metabolic encephalopathy -Secondary to infectious process -Further workup if no improvement with treatment of sepsis -Holding clonazepam  Acute on chronic renal failure--CKD stage III -Baseline creatinine 1.5-1.7 -Presenting serum creatinine 2.10 -Secondary to sepsis and volume depletion -Holding lisinopril and lasix  Essential hypertension -Restart decreased dose carvedilol holding lisinopril -Secondary to soft blood pressure  Diabetes mellitus type 2 -Start reduced dose Levemir -NovoLog sliding scale -Hemoglobin A1c  Hyperlipidemia -Continue statin  Chronic pain syndrome -continue home dose percocet        Past Medical History:  Diagnosis Date  . Acute systolic heart failure ([place])   . Anemia of other chronic disease   . Chronic kidney disease, unspecified   . Chronic pain syndrome   . Congestive heart failure, unspecified   . Coronary atherosclerosis of native coronary artery   . History of blood transfusion 1960's   "when my last child was born" (12/23/2012)  . Hypoxemia   . Other chronic pulmonary heart diseases   . Other primary cardiomyopathies   . Other specified forms of chronic ischemic heart disease   . Pneumonia    "when I  was little" (12/23/2012)  . Stroke [facility]) ~ 2011   "slowed my walking" (12/23/2012)  . Type II diabetes mellitus ([place])   . Unspecified hypertensive kidney disease with chronic kidney disease stage I through stage  IV, or unspecified(403.90)   . Unspecified pleural effusion    Past Surgical History:  Procedure Laterality Date  . ABDOMINAL HYSTERECTOMY    . APPENDECTOMY    . CATARACT EXTRACTION, BILATERAL    . CHOLECYSTECTOMY    . DILATION AND CURETTAGE OF UTERUS    . PATELLA RECONSTRUCTION Bilateral    "had my knee caps replaced" (12/23/2012)  . TONSILLECTOMY     Social History:  reports that  has never smoked. she has never used smokeless tobacco. She reports that she does not drink alcohol or use drugs.   Family History  Problem Relation Age of Onset  . Heart attack Father 28       MI  . Cancer Father        leukemia  . Cancer Brother   . Heart disease Daughter      No Known Allergies   Prior to Admission medications   Medication Sig Start Date End Date Taking? Authorizing Provider  carvedilol (COREG) 12.5 MG tablet Take 12.5 mg by mouth 2 (two) times daily with a meal.   Yes [provider]  clonazePAM (KLONOPIN) 0.5 MG tablet Take 0.5 mg by mouth 3 (three) times daily as needed for anxiety.    Yes [provider]  clopidogrel (PLAVIX) 75 MG tablet Take 75 mg by mouth daily.   Yes [provider]  DULoxetine (CYMBALTA) 60 MG capsule Take 60 mg by mouth 2 (two) times daily.    Yes [provider]  furosemide (LASIX) 40 MG tablet Take 40 mg by mouth daily as needed for fluid.  12/04/10  Yes de Stanford Scotland, MD  insulin detemir (LEVEMIR) 100 UNIT/ML injection Inject 0.2 mLs (20 Units total) into the skin at bedtime. 06/22/17  Yes Oswald Hillock, MD  lisinopril (PRINIVIL,ZESTRIL) 20 MG tablet Take 1 tablet by mouth daily. 02/06/16  Yes [provider]  meclizine (ANTIVERT) 25 MG tablet Take 25 mg by mouth 3 (three) times daily as needed for dizziness.   Yes [provider]  oxyCODONE-acetaminophen (PERCOCET) 10-325 MG tablet Take 1 tablet by mouth 4 (four) times daily.   Yes [provider]  pantoprazole (PROTONIX) 40 MG tablet  Take 1 tablet by mouth at bedtime.  02/11/16  Yes [provider]  potassium chloride (K-DUR) 10 MEQ tablet Take 1 tablet by mouth daily.  02/18/16  Yes [provider]  promethazine (PHENERGAN) 25 MG tablet Take 1 tablet by mouth every 4 (four) hours as needed. 02/19/16  Yes [provider]  simvastatin (ZOCOR) 20 MG tablet Take 20 mg by mouth every evening.    Yes [provider]    Review of Systems:  Constitutional:  No weight loss, night sweats, Head&Eyes: No headache.  No vision loss.  No eye pain or scotoma ENT:  No Difficulty swallowing,Tooth/dental problems,Sore throat,  No ear ache, post nasal drip,  Cardio-vascular:  No chest pain, Orthopnea, PND, swelling in lower extremities,  dizziness, palpitations  GI:  No  abdominal pain, nausea, vomiting, diarrhea, loss of appetite, hematochezia, melena, heartburn, indigestion, Resp:  No shortness of breath with exertion or at rest. No cough. No coughing up of blood .No wheezing.No chest wall deformity  Skin:  no rash or lesions.  GU:  no dysuria, change in color of urine, no urgency or frequency. No flank pain.  Musculoskeletal:  No joint pain or swelling. No decreased range of motion. No back pain.  Psych:  +confusion Neurologic: no dysesthesia, no focal weakness, no vision loss. No syncope  Physical Exam: Vitals:   07/26/17 1330 07/26/17 1400 07/26/17 1419 07/26/17 1430  BP: (!) 113/58 (!) 124/59 (!) 124/59 129/63  Pulse: 79 75 77 78  Resp: (!) 23 (!) 22 18 19   Temp:      TempSrc:      SpO2: 93% 93% 92% 97%  Weight:       General:  A&O x 2, NAD, nontoxic, pleasant/cooperative Head/Eye: No conjunctival hemorrhage, no icterus, [place]/AT, No nystagmus ENT:  No icterus,  No thrush, good dentition, no pharyngeal exudate Neck:  No masses, no lymphadenpathy, no bruits CV:  RRR, no rub, no gallop, no S3 Lung: Bibasilar crackles.  No wheezing.  Good air movement. Abdomen: soft/NT, +BS,  nondistended, no peritoneal signs Ext: No cyanosis, No rashes, No petechiae, No lymphangitis, No edema Neuro: CNII-XII intact, strength 4/5 in bilateral upper and lower extremities, no dysmetria  Labs on Admission:  Basic Metabolic Panel: Recent Labs  Lab 07/26/17 1150  NA 131*  K 4.2  CL 100*  CO2 21*  GLUCOSE 344*  BUN 28*  CREATININE 2.10*  CALCIUM 8.4*   Liver Function Tests: Recent Labs  Lab 07/26/17 1150  AST 38  ALT 17  ALKPHOS 155*  BILITOT 1.4*  PROT 6.8  ALBUMIN 2.9*   No results for input(s): LIPASE, AMYLASE in the last 168 hours. No results for input(s): AMMONIA in the last 168 hours. CBC: Recent Labs  Lab 07/26/17 1150  WBC 25.3*  NEUTROABS 20.6*  HGB 10.0*  HCT 30.7*  MCV 88.7  PLT 261   Coagulation Profile: Recent Labs  Lab 07/26/17 1150  INR 1.11   Cardiac Enzymes: Recent Labs  Lab 07/26/17 1159  TROPONINI <0.03   BNP: Invalid input(s): POCBNP CBG: No results for input(s): GLUCAP in the last 168 hours. Urine analysis:    Component Value Date/Time   COLORURINE YELLOW 07/26/2017 1223   APPEARANCEUR CLOUDY (A) 07/26/2017 1223   LABSPEC 1.012 07/26/2017 1223   PHURINE 5.0 07/26/2017 1223   GLUCOSEU NEGATIVE 07/26/2017 1223   HGBUR MODERATE (A) 07/26/2017 1223   BILIRUBINUR NEGATIVE 07/26/2017 1223   KETONESUR NEGATIVE 07/26/2017 1223   PROTEINUR 100 (A) 07/26/2017 1223   UROBILINOGEN 1.0 04/25/2008 1210   NITRITE NEGATIVE 07/26/2017 1223   LEUKOCYTESUR MODERATE (A) 07/26/2017 1223   Sepsis Labs: @LABRCNTIP (procalcitonin:4,lacticidven:4) ) Recent Results (from the past 240 hour(s))  Culture, blood (Routine x 2)     Status: None (Preliminary result)   Collection Time: 07/26/17 11:51 AM  Result Value Ref Range Status   Specimen Description BLOOD LEFT WRIST  Final   Special Requests   Final    BOTTLES DRAWN AEROBIC AND ANAEROBIC Blood Culture adequate volume   Culture PENDING  Incomplete   Report Status PENDING  Incomplete    Culture, blood (Routine x 2)     Status: None (Preliminary result)   Collection Time: 07/26/17 11:59 AM  Result Value Ref Range Status   Specimen Description RIGHT ANTECUBITAL  Final   Special Requests   Final    BOTTLES DRAWN AEROBIC AND ANAEROBIC Blood Culture adequate volume   Culture PENDING  Incomplete   Report Status PENDING  Incomplete     Radiological Exams on Admission: [place] Chest Port 1  View  Result Date: 07/26/2017 CLINICAL DATA:  Sepsis. EXAM: PORTABLE CHEST 1 VIEW COMPARISON:  06/18/2017 FINDINGS: Cardiomediastinal silhouette is normal. Mediastinal contours appear intact. Calcific atherosclerotic disease and tortuosity of the aorta. There is no evidence of focal airspace consolidation, pleural effusion or pneumothorax. Diffusely increased interstitial markings. Minimal peribronchial airspace opacities in the right lower lobe. Osseous structures are without acute abnormality. Soft tissues are grossly normal. IMPRESSION: Minimal peribronchial airspace opacities in the right lower lobe may represent developing bronchopneumonia or areas of atelectasis. Diffuse mildly increased interstitial markings. Electronically Signed   By: Fidela Salisbury M.D.   On: 07/26/2017 13:51    EKG: Independently reviewed.  Sinus rhythm, nonspecific T wave change    Time spent:60 minutes Code Status:   DNR Family Communication:  Spouse updated at bedside Disposition Plan: expect 2-3 day hospitalization Consults called: none DVT Prophylaxis: [place] Heparin   Jamya Starry, DO  [facility] Pager [phone]  If 7PM-7AM, please contact night-coverage [email or web address] Password [email or web address] 07/26/2017, 2:51 PM

## 2017-07-27 DIAGNOSIS — N10 Acute tubulo-interstitial nephritis: Secondary | ICD-10-CM

## 2017-07-27 DIAGNOSIS — G92 Toxic encephalopathy: Secondary | ICD-10-CM

## 2017-07-27 LAB — BASIC METABOLIC PANEL
Anion gap: 10 (ref 5–15)
BUN: 24 mg/dL — AB (ref 6–20)
CHLORIDE: 103 mmol/L (ref 101–111)
CO2: 21 mmol/L — ABNORMAL LOW (ref 22–32)
CREATININE: 1.74 mg/dL — AB (ref 0.44–1.00)
Calcium: 8.4 mg/dL — ABNORMAL LOW (ref 8.9–10.3)
GFR calc Af Amer: 30 mL/min — ABNORMAL LOW (ref >60)
GFR, EST NON AFRICAN AMERICAN: 26 mL/min — AB (ref >60)
Glucose, Bld: 231 mg/dL — ABNORMAL HIGH (ref 65–99)
Potassium: 4.1 mmol/L (ref 3.5–5.1)
SODIUM: 134 mmol/L — AB (ref 135–145)

## 2017-07-27 LAB — GLUCOSE, CAPILLARY
GLUCOSE-CAPILLARY: 206 mg/dL — AB (ref 65–99)
GLUCOSE-CAPILLARY: 292 mg/dL — AB (ref 65–99)
Glucose-Capillary: 243 mg/dL — ABNORMAL HIGH (ref 65–99)
Glucose-Capillary: 219 mg/dL — ABNORMAL HIGH (ref 65–99)

## 2017-07-27 LAB — CBC
HCT: 27.9 % — ABNORMAL LOW (ref 36.0–46.0)
Hemoglobin: 9.1 g/dL — ABNORMAL LOW (ref 12.0–15.0)
MCH: 29.1 pg (ref 26.0–34.0)
MCHC: 32.6 g/dL (ref 30.0–36.0)
MCV: 89.1 fL (ref 78.0–100.0)
PLATELETS: 285 10*3/uL (ref 150–400)
RBC: 3.13 MIL/uL — ABNORMAL LOW (ref 3.87–5.11)
RDW: 14.7 % (ref 11.5–15.5)
WBC: 24.7 10*3/uL — AB (ref 4.0–10.5)

## 2017-07-27 MED ORDER — INSULIN DETEMIR 100 UNIT/ML ~~LOC~~ SOLN
18.0000 [IU] | Freq: Every day | SUBCUTANEOUS | Status: DC
  Administered 2017-07-27: 18 [IU] via SUBCUTANEOUS
  Filled 2017-07-27 (×2): qty 0.18

## 2017-07-27 MED ORDER — POTASSIUM CHLORIDE IN NACL 20-0.9 MEQ/L-% IV SOLN
INTRAVENOUS | Status: DC
  Administered 2017-07-27 – 2017-07-28 (×2): via INTRAVENOUS

## 2017-07-27 NOTE — Progress Notes (Signed)
PROGRESS NOTE  Norma [name] [id number] DOB: Oct 10, 1933 DOA: 07/26/2017 PCP: Caryl Bis, MD  Brief History:  83 y.o. female with medical history of diabetes mellitus, CKD stage III, cardiomyopathy, hypertension, and chronic pain syndrome presenting with 2-day history of "not acting like herself" and decreased oral intake.  Her symptoms have progressed to the point where she had difficulty getting out of bed on the morning of July 26, 2017.  In addition, her head continue to worsen.  As result, the patient was brought to the emergency department for further evaluation.  The patient is pleasantly confused, but she is able to answer simple questions.  She denies any chest pain, shortness breath, coughing, hemoptysis, nausea, vomiting, diarrhea, dysuria, hematuria, hematochezia, melena.  Upon EMS arrival, the patient was noted to be hypotensive with a blood pressure of 80/50.  The patient has been complaining of intermittent abdominal pain for 2-3 weeks.  Her abdominal pain is worse in the upper abdomen--epigastric and right upper quadrant.  Her abdominal pain is intermittently worse with eating.  She denies any new medications.  She denies any recent travels or eating any unusual or undercooked foods.  The patient was recently discharged from the hospital after a stay from June 18, 2017 through June 22, 2017 when she was treated for sepsis secondary to UTI and Klebsiella bacteremia.  The patient was discharged home with cephalexin for 10 days    Assessment/Plan: Sepsis -due to a urinary source, but cannot rule out bacteremia or intra-abdominal source  -Continue vancomycin and ceftriaxone pending culture data -Lactic acid 0.91 -Check procalcitonin--3.24 -Continue IV fluids -am CBC -personally reviewed CXR--no consolidation  Pyelonephritis -Continue ceftriaxone pending culture data  Abdominal pain/Right Hydronephrosis -likely due to pyelonephritis -consult  urology -Check lipase--16 -CT abdomen and pelvis--right hydroureteronephrosis without visible stone.  No bowel wall thickening.  Acute metabolic encephalopathy -Secondary to infectious process -Improving with treatment of infection -Holding clonazepam -personally reviewed EKG--sinus, nonspecific T wave changes  Acute on chronic renal failure--CKD stage III -Baseline creatinine 1.5-1.7 -Presenting serum creatinine 2.10 -Secondary to sepsis and volume depletion -Holding lisinopril and lasix -am BMP  RLL cavitary lung nodule -Previously biopsied December 23, 2012--benign  Right liver mass/hypodensities -Stable on imaging since November 15, 2010  Essential hypertension -Restart decreased dose carvedilol holding lisinopril -Secondary to soft blood pressure  Diabetes mellitus type 2 -Increase Levemir to 18 units -Increase to moderate sliding scale -NovoLog sliding scale -Hemoglobin A1c  Hyperlipidemia -Continue statin  Chronic pain syndrome -continue home dose percocet    Disposition Plan:   Home 11/14 or 11/15 Family Communication:   No Family at bedside  Consultants:  urology  Code Status:  FULL  DVT Prophylaxis:  [place] Heparin    Procedures: As Listed in Progress Note Above  Antibiotics: None    Subjective:   Objective: Vitals:   07/26/17 1639 07/26/17 2019 07/27/17 0634 07/27/17 1300  BP: (!) 149/72 (!) 119/56 (!) 129/57 136/84  Pulse: 93 85 88 97  Resp: 20 18 20 20   Temp: 99 F (37.2 C) 98.6 F (37 C) 99.7 F (37.6 C) 98.4 F (36.9 C)  TempSrc: Oral Oral Oral Oral  SpO2: 97% 94% 95% 96%  Weight: 79.1 kg (174 lb 6.1 oz)     Height: 5\' 2"  (1.575 m)       Intake/Output Summary (Last 24 hours) at 07/27/2017 1801 Last data filed at 07/27/2017 1758 Gross per 24 hour  Intake 410  ml  Output -  Net 410 ml   Weight change:  Exam:   General:  Pt is alert, follows commands appropriately, not in acute distress  HEENT: No icterus, No thrush,  No neck mass, [place]/AT  Cardiovascular: RRR, S1/S2, no rubs, no gallops  Respiratory: CTA bilaterally, no wheezing, no crackles, no rhonchi  Abdomen: Soft/+BS, non tender, non distended, no guarding  Extremities: No edema, No lymphangitis, No petechiae, No rashes, no synovitis   Data Reviewed: I have personally reviewed following labs and imaging studies Basic Metabolic Panel: Recent Labs  Lab 07/26/17 1150 07/27/17 0526  NA 131* 134*  K 4.2 4.1  CL 100* 103  CO2 21* 21*  GLUCOSE 344* 231*  BUN 28* 24*  CREATININE 2.10* 1.74*  CALCIUM 8.4* 8.4*   Liver Function Tests: Recent Labs  Lab 07/26/17 1150  AST 38  ALT 17  ALKPHOS 155*  BILITOT 1.4*  PROT 6.8  ALBUMIN 2.9*   Recent Labs  Lab 07/26/17 1807  LIPASE 16   No results for input(s): AMMONIA in the last 168 hours. Coagulation Profile: Recent Labs  Lab 07/26/17 1150 07/26/17 1807  INR 1.11 1.11   CBC: Recent Labs  Lab 07/26/17 1150 07/27/17 0526  WBC 25.3* 24.7*  NEUTROABS 20.6*  --   HGB 10.0* 9.1*  HCT 30.7* 27.9*  MCV 88.7 89.1  PLT 261 285   Cardiac Enzymes: Recent Labs  Lab 07/26/17 1159  TROPONINI <0.03   BNP: Invalid input(s): POCBNP CBG: Recent Labs  Lab 07/26/17 1628 07/26/17 2111 07/27/17 0733 07/27/17 1106 07/27/17 1634  GLUCAP 302* 232* 243* 219* 206*   HbA1C: No results for input(s): HGBA1C in the last 72 hours. Urine analysis:    Component Value Date/Time   COLORURINE YELLOW 07/26/2017 1223   APPEARANCEUR CLOUDY (A) 07/26/2017 1223   LABSPEC 1.012 07/26/2017 1223   PHURINE 5.0 07/26/2017 1223   GLUCOSEU NEGATIVE 07/26/2017 1223   HGBUR MODERATE (A) 07/26/2017 1223   BILIRUBINUR NEGATIVE 07/26/2017 1223   KETONESUR NEGATIVE 07/26/2017 1223   PROTEINUR 100 (A) 07/26/2017 1223   UROBILINOGEN 1.0 04/25/2008 1210   NITRITE NEGATIVE 07/26/2017 1223   LEUKOCYTESUR MODERATE (A) 07/26/2017 1223   Sepsis Labs: @LABRCNTIP (procalcitonin:4,lacticidven:4) ) Recent  Results (from the past 240 hour(s))  Culture, blood (Routine x 2)     Status: None (Preliminary result)   Collection Time: 07/26/17 11:51 AM  Result Value Ref Range Status   Specimen Description BLOOD LEFT WRIST  Final   Special Requests   Final    BOTTLES DRAWN AEROBIC AND ANAEROBIC Blood Culture adequate volume   Culture NO GROWTH < 24 HOURS  Final   Report Status PENDING  Incomplete  Culture, blood (Routine x 2)     Status: None (Preliminary result)   Collection Time: 07/26/17 11:59 AM  Result Value Ref Range Status   Specimen Description RIGHT ANTECUBITAL  Final   Special Requests   Final    BOTTLES DRAWN AEROBIC AND ANAEROBIC Blood Culture adequate volume   Culture NO GROWTH < 24 HOURS  Final   Report Status PENDING  Incomplete  MRSA PCR Screening     Status: None   Collection Time: 07/26/17  4:36 PM  Result Value Ref Range Status   MRSA by PCR NEGATIVE NEGATIVE Final    Comment:        The GeneXpert MRSA Assay (FDA approved for NASAL specimens only), is one component of a comprehensive MRSA colonization surveillance program. It is not intended to  diagnose MRSA infection nor to guide or monitor treatment for MRSA infections.      Scheduled Meds: . clopidogrel  75 mg Oral Daily  . DULoxetine  60 mg Oral BID  . heparin  5,000 Units Subcutaneous Q8H  . insulin aspart  0-15 Units Subcutaneous TID WC  . insulin aspart  0-5 Units Subcutaneous QHS  . insulin detemir  18 Units Subcutaneous QHS  . oxyCODONE-acetaminophen  1 tablet Oral QID   And  . oxyCODONE  5 mg Oral QID  . pantoprazole  40 mg Oral QHS  . simvastatin  20 mg Oral QPM   Continuous Infusions: . 0.9 % NaCl with KCl 20 mEq / L    . cefTRIAXone (ROCEPHIN)  IV Stopped (07/27/17 1405)  . vancomycin Stopped (07/27/17 0659)    Procedures/Studies: Ct Abdomen Pelvis Wo Contrast  Result Date: 07/27/2017 CLINICAL DATA:  Inpatient.  Abdominal pain.  Sepsis. EXAM: CT ABDOMEN AND PELVIS WITHOUT CONTRAST  TECHNIQUE: Multidetector CT imaging of the abdomen and pelvis was performed following the standard protocol without IV contrast. COMPARISON:  06/18/2017 abdominal radiograph. 11/15/2010 CT abdomen/pelvis. FINDINGS: Lower chest: Chronic cavitary 1.5 cm basilar right lower lobe nodule (series 4/image 13), previously 1.8 cm on 09/27/2014 CT, decreased. Mild scarring at the right lung base. Coronary atherosclerosis. Stable dilated main pulmonary artery (3.5 cm diameter). Hepatobiliary: Normal liver size. Hypodense 1.7 cm posterior right liver lobe mass (series 2/ image 19) and subcentimeter hypodense inferior right liver lobe lesion are stable since 11/15/2010 CT, considered benign. No new liver lesions. Cholecystectomy. Bile ducts are stable and within normal post cholecystectomy limits with common bile duct diameter 8 mm. Pancreas: Heterogeneous fatty infiltration of the pancreas, with no pancreatic mass or duct dilation. Spleen: Normal size. No mass. Adrenals/Urinary Tract: Normal adrenals. Mild right hydroureteronephrosis to the level of the right ureterovesical junction. No left hydronephrosis. Normal caliber left ureter. No renal or ureteral stones. No discrete obstructing mass. No contour deforming renal masses. Nondistended bladder. Gas in the nondependent bladder lumen. No definite bladder wall thickening accounting for the nondistended state. Stomach/Bowel: Small hiatal hernia. Otherwise normal nondistended stomach. Normal caliber small bowel with no small bowel wall thickening. Appendectomy. Normal large bowel with no diverticulosis, large bowel wall thickening or pericolonic fat stranding. Vascular/Lymphatic: Atherosclerotic nonaneurysmal abdominal aorta . No pathologically enlarged lymph nodes in the abdomen or pelvis. Reproductive: Status post hysterectomy, with no abnormal findings at the vaginal cuff. No adnexal mass. Other: No pneumoperitoneum, ascites or focal fluid collection. Musculoskeletal: No  aggressive appearing focal osseous lesions. Marked thoracolumbar spondylosis. IMPRESSION: 1. Mild right hydroureteronephrosis to the level of the right ureterovesical junction. No urolithiasis. No discrete obstructing mass. Consider further evaluation with cystoscopy/ureteroscopy and/or hematuria protocol CT abdomen/pelvis without and with IV contrast. 2. Nonspecific gas in the bladder. If there is no history of recent bladder instrumentation to explain this gas, recommend correlation with urinalysis to exclude gas producing infection . 3. Chronic cavitary pulmonary nodule at the right lung base is decreased in size, compatible with a benign etiology . 4.  Aortic Atherosclerosis (ICD10-I70.0).  Coronary atherosclerosis. 5. Small hiatal hernia . Electronically Signed   By: Ilona Sorrel M.D.   On: 07/27/2017 07:10   [facility] Chest Port 1 View  Result Date: 07/26/2017 CLINICAL DATA:  Sepsis. EXAM: PORTABLE CHEST 1 VIEW COMPARISON:  06/18/2017 FINDINGS: Cardiomediastinal silhouette is normal. Mediastinal contours appear intact. Calcific atherosclerotic disease and tortuosity of the aorta. There is no evidence of focal airspace consolidation, pleural effusion  or pneumothorax. Diffusely increased interstitial markings. Minimal peribronchial airspace opacities in the right lower lobe. Osseous structures are without acute abnormality. Soft tissues are grossly normal. IMPRESSION: Minimal peribronchial airspace opacities in the right lower lobe may represent developing bronchopneumonia or areas of atelectasis. Diffuse mildly increased interstitial markings. Electronically Signed   By: Fidela Salisbury M.D.   On: 07/26/2017 13:51    Lorine Iannaccone, DO  [facility] Pager [phone]  If 7PM-7AM, please contact night-coverage [email or web address] Password [email or web address] 07/27/2017, 6:01 PM   LOS: 1 day

## 2017-07-27 NOTE — Progress Notes (Signed)
Inpatient Diabetes Program Recommendations  AACE/ADA: New Consensus Statement on Inpatient Glycemic Control (2015)  Target Ranges:  Prepandial:   less than 140 mg/dL      Peak postprandial:   less than 180 mg/dL (1-2 hours)      Critically ill patients:  140 - 180 mg/dL   Results for [name], [name] (MRN [id number]) as of 07/27/2017 07:48  Ref. Range 07/26/2017 16:28 07/26/2017 21:11 07/27/2017 07:33  Glucose-Capillary Latest Ref Range: 65 - 99 mg/dL 302 (H) 232 (H) 243 (H)    Admit with: Sepsis  History: DM  Home DM Meds: Levemir 20 units QHS  Current Insulin Orders: Levemir 10 units QHS      Novolog Moderate Correction Scale/ SSI (0-15 units) TID AC + HS       MD- Please consider the following in-hospital insulin adjustments:  Increase Levemir to 20 units QHS (home dose)      --Will follow patient during hospitalization--  Wyn Quaker RN, MSN, CDE Diabetes Coordinator Inpatient Glycemic Control Team Team Pager: [phone] (8a-5p)

## 2017-07-27 NOTE — Progress Notes (Signed)
Pharmacy Antibiotic Note  Norma [name] is a 83 y.o. female admitted on 07/26/2017 with sepsis.  Pharmacy has been consulted for Vancomycin dosing.  Plan: Vancomycin 1000mg  x 1 then 750mg  IV q24hrs (renally adjusted) Check trough at steady state Rocephin 2gm IV q24hrs per MD Monitor labs, progress, c/s  Height: 5\' 2"  (157.5 cm) Weight: 174 lb 6.1 oz (79.1 kg) IBW/kg (Calculated) : 50.1  Temp (24hrs), Avg:99.4 F (37.4 C), Min:98.6 F (37 C), Max:100.1 F (37.8 C)  Recent Labs  Lab 07/26/17 1150 07/26/17 1213 07/26/17 1509 07/26/17 1807 07/27/17 0526  WBC 25.3*  --   --   --  24.7*  CREATININE 2.10*  --   --   --  1.74*  LATICACIDVEN  --  0.91 1.12 1.2  --     Estimated Creatinine Clearance: 24.7 mL/min (A) (by C-G formula based on SCr of 1.74 mg/dL (H)).    No Known Allergies  Antimicrobials this admission: Rocephin 11/12  >>  Vancomycin 11/11 >>   Dose adjustments this admission:  Microbiology results: 11/11 BCx: pending 11/11 UCx: pending  11/11 MRSA PCR: negative  Thank you for allowing pharmacy to be a part of this patient's care.  Hart Robinsons A 07/27/2017 9:25 AM

## 2017-07-28 ENCOUNTER — Inpatient Hospital Stay (HOSPITAL_COMMUNITY): Payer: Medicare PPO

## 2017-07-28 DIAGNOSIS — N390 Urinary tract infection, site not specified: Secondary | ICD-10-CM

## 2017-07-28 DIAGNOSIS — B961 Klebsiella pneumoniae [K. pneumoniae] as the cause of diseases classified elsewhere: Secondary | ICD-10-CM

## 2017-07-28 DIAGNOSIS — N1330 Unspecified hydronephrosis: Secondary | ICD-10-CM

## 2017-07-28 DIAGNOSIS — I340 Nonrheumatic mitral (valve) insufficiency: Secondary | ICD-10-CM

## 2017-07-28 DIAGNOSIS — A414 Sepsis due to anaerobes: Secondary | ICD-10-CM

## 2017-07-28 DIAGNOSIS — R7881 Bacteremia: Secondary | ICD-10-CM

## 2017-07-28 DIAGNOSIS — A4159 Other Gram-negative sepsis: Secondary | ICD-10-CM

## 2017-07-28 LAB — CBC
HCT: 28.2 % — ABNORMAL LOW (ref 36.0–46.0)
Hemoglobin: 9.1 g/dL — ABNORMAL LOW (ref 12.0–15.0)
MCH: 28.6 pg (ref 26.0–34.0)
MCHC: 32.3 g/dL (ref 30.0–36.0)
MCV: 88.7 fL (ref 78.0–100.0)
PLATELETS: 305 10*3/uL (ref 150–400)
RBC: 3.18 MIL/uL — AB (ref 3.87–5.11)
RDW: 14.8 % (ref 11.5–15.5)
WBC: 20.1 10*3/uL — AB (ref 4.0–10.5)

## 2017-07-28 LAB — BLOOD CULTURE ID PANEL (REFLEXED)
Acinetobacter baumannii: NOT DETECTED
CANDIDA GLABRATA: NOT DETECTED
Candida albicans: NOT DETECTED
Candida krusei: NOT DETECTED
Candida parapsilosis: NOT DETECTED
Candida tropicalis: NOT DETECTED
Carbapenem resistance: NOT DETECTED
ENTEROBACTER CLOACAE COMPLEX: NOT DETECTED
ENTEROCOCCUS SPECIES: NOT DETECTED
ESCHERICHIA COLI: NOT DETECTED
Enterobacteriaceae species: DETECTED — AB
Haemophilus influenzae: NOT DETECTED
Klebsiella oxytoca: NOT DETECTED
Klebsiella pneumoniae: DETECTED — AB
LISTERIA MONOCYTOGENES: NOT DETECTED
NEISSERIA MENINGITIDIS: NOT DETECTED
Proteus species: NOT DETECTED
Pseudomonas aeruginosa: NOT DETECTED
SERRATIA MARCESCENS: NOT DETECTED
STAPHYLOCOCCUS SPECIES: NOT DETECTED
STREPTOCOCCUS AGALACTIAE: NOT DETECTED
STREPTOCOCCUS PNEUMONIAE: NOT DETECTED
STREPTOCOCCUS SPECIES: NOT DETECTED
Staphylococcus aureus (BCID): NOT DETECTED
Streptococcus pyogenes: NOT DETECTED

## 2017-07-28 LAB — ECHOCARDIOGRAM COMPLETE
Height: 62 in
Weight: 2790.14 oz

## 2017-07-28 LAB — GLUCOSE, CAPILLARY
GLUCOSE-CAPILLARY: 210 mg/dL — AB (ref 65–99)
Glucose-Capillary: 237 mg/dL — ABNORMAL HIGH (ref 65–99)
Glucose-Capillary: 197 mg/dL — ABNORMAL HIGH (ref 65–99)
Glucose-Capillary: 149 mg/dL — ABNORMAL HIGH (ref 65–99)

## 2017-07-28 LAB — COMPREHENSIVE METABOLIC PANEL
ALBUMIN: 2.3 g/dL — AB (ref 3.5–5.0)
ALT: 10 U/L — AB (ref 14–54)
AST: 10 U/L — AB (ref 15–41)
Alkaline Phosphatase: 139 U/L — ABNORMAL HIGH (ref 38–126)
Anion gap: 8 (ref 5–15)
BUN: 25 mg/dL — AB (ref 6–20)
CHLORIDE: 103 mmol/L (ref 101–111)
CO2: 22 mmol/L (ref 22–32)
CREATININE: 1.65 mg/dL — AB (ref 0.44–1.00)
Calcium: 8.6 mg/dL — ABNORMAL LOW (ref 8.9–10.3)
GFR calc Af Amer: 33 mL/min — ABNORMAL LOW (ref >60)
GFR calc non Af Amer: 28 mL/min — ABNORMAL LOW (ref >60)
Glucose, Bld: 224 mg/dL — ABNORMAL HIGH (ref 65–99)
Potassium: 3.8 mmol/L (ref 3.5–5.1)
SODIUM: 133 mmol/L — AB (ref 135–145)
Total Bilirubin: 0.5 mg/dL (ref 0.3–1.2)
Total Protein: 6.1 g/dL — ABNORMAL LOW (ref 6.5–8.1)

## 2017-07-28 LAB — HEMOGLOBIN A1C
Hgb A1c MFr Bld: 9.3 % — ABNORMAL HIGH (ref 4.8–5.6)
MEAN PLASMA GLUCOSE: 220.21 mg/dL

## 2017-07-28 MED ORDER — INSULIN ASPART 100 UNIT/ML ~~LOC~~ SOLN
4.0000 [IU] | Freq: Three times a day (TID) | SUBCUTANEOUS | Status: DC
  Administered 2017-07-28 – 2017-07-29 (×3): 4 [IU] via SUBCUTANEOUS

## 2017-07-28 MED ORDER — CIPROFLOXACIN HCL 250 MG PO TABS
500.0000 mg | ORAL_TABLET | Freq: Every day | ORAL | Status: DC
  Administered 2017-07-28 – 2017-07-29 (×2): 500 mg via ORAL
  Filled 2017-07-28 (×2): qty 2

## 2017-07-28 MED ORDER — INSULIN DETEMIR 100 UNIT/ML ~~LOC~~ SOLN
25.0000 [IU] | Freq: Every day | SUBCUTANEOUS | Status: DC
  Administered 2017-07-28: 25 [IU] via SUBCUTANEOUS
  Filled 2017-07-28 (×2): qty 0.25

## 2017-07-28 NOTE — Consult Note (Signed)
   [facility] CM Inpatient Consult   07/28/2017  [name] 06-21-1934 [id number]   Patient screened for [facility] program services. Transition of care will be conducted by Primary Care Provider: [place]. Patient has had [facility] previously and is familiar with the program.  Needs identified are 2 admits in 6 months and Hgb A1c 9.3. Notification sent to Provider office contact personnel Denman George.  Will make inpatient RNCM aware of the above. Please contact for further questions:  Aisa Schoeppner RN, [facility] Liaison  [phone]) [place] [phone]) Toll free office

## 2017-07-28 NOTE — Progress Notes (Signed)
Lost patient's only IV access. 3 attempts were made to regain access. All 3 were unsuccessful. Dr. Carles Collet paged and made aware. New order received for PICC line. Vascular team made aware, however, team is unable to place PICC until tomorrow morning at 0900. Dr. Carles Collet paged and made aware. New order to d/c Rocephin and place patient on PO Cipro 500 mg daily, 1 dose now. AC made aware, stated ICU RN that is working tonight is [place] trained and may be able to gain access via [place]. Night shift RN made aware of the above.

## 2017-07-28 NOTE — Progress Notes (Signed)
*  PRELIMINARY RESULTS* Echocardiogram 2D Echocardiogram has been performed.  Leavy Cella 07/28/2017, 10:21 AM

## 2017-07-28 NOTE — Consult Note (Signed)
Urology Consult   Physician requesting consult: Tat  Reason for consult: Urinary tract infection, right hydronephrosis  History of Present Illness: Norma [name] is a 83 y.o. female admitted for the second time in just over a month with urinary tract infection (Klebsiella), with altered mental status.  She was admitted this time 2 days ago.  The patient has negative blood cultures.  CT of the abdomen and pelvis without contrast was performed recently revealing right hydroureteronephrosis down to the ureterovesical junction.  There is no evidence of stone disease.  Bladder appeared normal.  She has no long-standing issues with frequent urinary tract infections.  She denies blood in her urine.  She does have limited mobility due to multiple medical issues.  Urologic consultation is requested.    Past Medical History:  Diagnosis Date  . Acute systolic heart failure ([place])   . Anemia of other chronic disease   . Chronic kidney disease, unspecified   . Chronic pain syndrome   . Congestive heart failure, unspecified   . Coronary atherosclerosis of native coronary artery   . History of blood transfusion 1960's   "when my last child was born" (12/23/2012)  . Hypoxemia   . Other chronic pulmonary heart diseases   . Other primary cardiomyopathies   . Other specified forms of chronic ischemic heart disease   . Pneumonia    "when I was little" (12/23/2012)  . Stroke [facility]) ~ 2011   "slowed my walking" (12/23/2012)  . Type II diabetes mellitus ([place])   . Unspecified hypertensive kidney disease with chronic kidney disease stage I through stage IV, or unspecified(403.90)   . Unspecified pleural effusion     Past Surgical History:  Procedure Laterality Date  . ABDOMINAL HYSTERECTOMY    . APPENDECTOMY    . CATARACT EXTRACTION, BILATERAL    . CHOLECYSTECTOMY    . DILATION AND CURETTAGE OF UTERUS    . PATELLA RECONSTRUCTION Bilateral    "had my knee caps replaced" (12/23/2012)  . TONSILLECTOMY        Current Hospital Medications: Scheduled Meds: . clopidogrel  75 mg Oral Daily  . DULoxetine  60 mg Oral BID  . heparin  5,000 Units Subcutaneous Q8H  . insulin aspart  0-15 Units Subcutaneous TID WC  . insulin aspart  0-5 Units Subcutaneous QHS  . insulin detemir  18 Units Subcutaneous QHS  . oxyCODONE-acetaminophen  1 tablet Oral QID   And  . oxyCODONE  5 mg Oral QID  . pantoprazole  40 mg Oral QHS  . simvastatin  20 mg Oral QPM   Continuous Infusions: . 0.9 % NaCl with KCl 20 mEq / L 75 mL/hr at 07/28/17 0827  . cefTRIAXone (ROCEPHIN)  IV Stopped (07/27/17 1405)   PRN Meds:.acetaminophen **OR** acetaminophen  Allergies: No Known Allergies  Family History  Problem Relation Age of Onset  . Heart attack Father 36       MI  . Cancer Father        leukemia  . Cancer Brother   . Heart disease Daughter     Social History:  reports that  has never smoked. she has never used smokeless tobacco. She reports that she does not drink alcohol or use drugs.  ROS: A complete review of systems was performed.  All systems are negative except for pertinent findings as noted.  Physical Exam:  Vital signs in last 24 hours: Temp:  [97.9 F (36.6 C)-98.4 F (36.9 C)] 98.4 F (36.9 C) (11/13 5035)  Pulse Rate:  [85-97] 85 (11/13 0624) Resp:  [20] 20 (11/13 0624) BP: (114-139)/(44-84) 139/65 (11/13 0624) SpO2:  [96 %-98 %] 98 % (11/13 0624) General:  Alert and oriented, No acute distress, patient lethargic, appearing chronically ill. HEENT: Normocephalic, atraumatic Neck: No  lymphadenopathy Lungs: Normal inspiratory and expiratory excursion.  No respiratory distress. Abdomen: Obese Neurologic: Grossly intact  Laboratory Data:  Recent Labs    07/26/17 1150 07/27/17 0526 07/28/17 0554  WBC 25.3* 24.7* 20.1*  HGB 10.0* 9.1* 9.1*  HCT 30.7* 27.9* 28.2*  PLT 261 285 305    Recent Labs    07/26/17 1150 07/27/17 0526 07/28/17 0554  NA 131* 134* 133*  K 4.2 4.1 3.8   CL 100* 103 103  GLUCOSE 344* 231* 224*  BUN 28* 24* 25*  CALCIUM 8.4* 8.4* 8.6*  CREATININE 2.10* 1.74* 1.65*     Results for orders placed or performed during the hospital encounter of 07/26/17 (from the past 24 hour(s))  Glucose, capillary     Status: Abnormal   Collection Time: 07/27/17  4:34 PM  Result Value Ref Range   Glucose-Capillary 206 (H) 65 - 99 mg/dL   Comment 1 Notify RN    Comment 2 Document in Chart   Glucose, capillary     Status: Abnormal   Collection Time: 07/27/17  8:56 PM  Result Value Ref Range   Glucose-Capillary 292 (H) 65 - 99 mg/dL   Comment 1 Notify RN    Comment 2 Document in Chart   CBC     Status: Abnormal   Collection Time: 07/28/17  5:54 AM  Result Value Ref Range   WBC 20.1 (H) 4.0 - 10.5 K/uL   RBC 3.18 (L) 3.87 - 5.11 MIL/uL   Hemoglobin 9.1 (L) 12.0 - 15.0 g/dL   HCT 28.2 (L) 36.0 - 46.0 %   MCV 88.7 78.0 - 100.0 fL   MCH 28.6 26.0 - 34.0 pg   MCHC 32.3 30.0 - 36.0 g/dL   RDW 14.8 11.5 - 15.5 %   Platelets 305 150 - 400 K/uL  Comprehensive metabolic panel     Status: Abnormal   Collection Time: 07/28/17  5:54 AM  Result Value Ref Range   Sodium 133 (L) 135 - 145 mmol/L   Potassium 3.8 3.5 - 5.1 mmol/L   Chloride 103 101 - 111 mmol/L   CO2 22 22 - 32 mmol/L   Glucose, Bld 224 (H) 65 - 99 mg/dL   BUN 25 (H) 6 - 20 mg/dL   Creatinine, Ser 1.65 (H) 0.44 - 1.00 mg/dL   Calcium 8.6 (L) 8.9 - 10.3 mg/dL   Total Protein 6.1 (L) 6.5 - 8.1 g/dL   Albumin 2.3 (L) 3.5 - 5.0 g/dL   AST 10 (L) 15 - 41 U/L   ALT 10 (L) 14 - 54 U/L   Alkaline Phosphatase 139 (H) 38 - 126 U/L   Total Bilirubin 0.5 0.3 - 1.2 mg/dL   GFR calc non Af Amer 28 (L) >60 mL/min   GFR calc Af Amer 33 (L) >60 mL/min   Anion gap 8 5 - 15  Hemoglobin A1c     Status: Abnormal   Collection Time: 07/28/17  5:59 AM  Result Value Ref Range   Hgb A1c MFr Bld 9.3 (H) 4.8 - 5.6 %   Mean Plasma Glucose 220.21 mg/dL  Glucose, capillary     Status: Abnormal   Collection Time:  07/28/17  7:16 AM  Result Value Ref Range  Glucose-Capillary 210 (H) 65 - 99 mg/dL   Comment 1 Notify RN    Comment 2 Document in Chart   Glucose, capillary     Status: Abnormal   Collection Time: 07/28/17 11:26 AM  Result Value Ref Range   Glucose-Capillary 237 (H) 65 - 99 mg/dL   Comment 1 Notify RN    Comment 2 Document in Chart    Recent Results (from the past 240 hour(s))  Culture, blood (Routine x 2)     Status: Abnormal (Preliminary result)   Collection Time: 07/26/17 11:51 AM  Result Value Ref Range Status   Specimen Description BLOOD LEFT WRIST  Final   Special Requests   Final    BOTTLES DRAWN AEROBIC AND ANAEROBIC Blood Culture adequate volume   Culture  Setup Time   Final    GRAM NEGATIVE RODS Gram Stain Report Called to,Read Back By and Verified With: BIVENS,L ON 07/27/17 AT 1830 BY LOY,C PERFORMED AT [facility] GRAM STAIN REVIEWED-AGREE WITH RESULT CRITICAL RESULT CALLED TO, READ BACK BY AND VERIFIED WITH: N.LIGHTNER,RN 0320 07/28/17 M.CAMPBELL    Culture (A)  Final    KLEBSIELLA PNEUMONIAE SUSCEPTIBILITIES TO FOLLOW Performed at [facility], [place] [address] [place], [place] [address]    Report Status PENDING  Incomplete  Blood Culture ID Panel (Reflexed)     Status: Abnormal   Collection Time: 07/26/17 11:51 AM  Result Value Ref Range Status   Enterococcus species NOT DETECTED NOT DETECTED Final   Listeria monocytogenes NOT DETECTED NOT DETECTED Final   Staphylococcus species NOT DETECTED NOT DETECTED Final   Staphylococcus aureus NOT DETECTED NOT DETECTED Final   Streptococcus species NOT DETECTED NOT DETECTED Final   Streptococcus agalactiae NOT DETECTED NOT DETECTED Final   Streptococcus pneumoniae NOT DETECTED NOT DETECTED Final   Streptococcus pyogenes NOT DETECTED NOT DETECTED Final   Acinetobacter baumannii NOT DETECTED NOT DETECTED Final   Enterobacteriaceae species DETECTED (A) NOT DETECTED Final    Comment: Enterobacteriaceae represent a large  family of gram-negative bacteria, not a single organism. CRITICAL RESULT CALLED TO, READ BACK BY AND VERIFIED WITH: N.LIGHTNER,RN 0320 07/28/17 M.CAMPBELL    Enterobacter cloacae complex NOT DETECTED NOT DETECTED Final   Escherichia coli NOT DETECTED NOT DETECTED Final   Klebsiella oxytoca NOT DETECTED NOT DETECTED Final   Klebsiella pneumoniae DETECTED (A) NOT DETECTED Final    Comment: CRITICAL RESULT CALLED TO, READ BACK BY AND VERIFIED WITH: N.LIGHTNER,RN 0320 07/28/17 M.CAMPBELL    Proteus species NOT DETECTED NOT DETECTED Final   Serratia marcescens NOT DETECTED NOT DETECTED Final   Carbapenem resistance NOT DETECTED NOT DETECTED Final   Haemophilus influenzae NOT DETECTED NOT DETECTED Final   Neisseria meningitidis NOT DETECTED NOT DETECTED Final   Pseudomonas aeruginosa NOT DETECTED NOT DETECTED Final   Candida albicans NOT DETECTED NOT DETECTED Final   Candida glabrata NOT DETECTED NOT DETECTED Final   Candida krusei NOT DETECTED NOT DETECTED Final   Candida parapsilosis NOT DETECTED NOT DETECTED Final   Candida tropicalis NOT DETECTED NOT DETECTED Final    Comment: Performed at [facility], [place] [address], [place], [place] [address]  Culture, blood (Routine x 2)     Status: None (Preliminary result)   Collection Time: 07/26/17 11:59 AM  Result Value Ref Range Status   Specimen Description RIGHT ANTECUBITAL  Final   Special Requests   Final    BOTTLES DRAWN AEROBIC AND ANAEROBIC Blood Culture adequate volume   Culture NO GROWTH 2  DAYS  Final   Report Status PENDING  Incomplete  Urine culture     Status: Abnormal (Preliminary result)   Collection Time: 07/26/17 12:23 PM  Result Value Ref Range Status   Specimen Description URINE, CATHETERIZED  Final   Special Requests NONE  Final   Culture >=100,000 COLONIES/mL KLEBSIELLA PNEUMONIAE (A)  Final   Report Status PENDING  Incomplete  MRSA PCR Screening     Status: None   Collection Time: 07/26/17  4:36 PM  Result  Value Ref Range Status   MRSA by PCR NEGATIVE NEGATIVE Final    Comment:        The GeneXpert MRSA Assay (FDA approved for NASAL specimens only), is one component of a comprehensive MRSA colonization surveillance program. It is not intended to diagnose MRSA infection nor to guide or monitor treatment for MRSA infections.     Renal Function: Recent Labs    07/26/17 1150 07/27/17 0526 07/28/17 0554  CREATININE 2.10* 1.74* 1.65*   Estimated Creatinine Clearance: 26 mL/min (A) (by C-G formula based on SCr of 1.65 mg/dL (H)).  Radiologic Imaging: Ct Abdomen Pelvis Wo Contrast  Result Date: 07/27/2017 CLINICAL DATA:  Inpatient.  Abdominal pain.  Sepsis. EXAM: CT ABDOMEN AND PELVIS WITHOUT CONTRAST TECHNIQUE: Multidetector CT imaging of the abdomen and pelvis was performed following the standard protocol without IV contrast. COMPARISON:  06/18/2017 abdominal radiograph. 11/15/2010 CT abdomen/pelvis. FINDINGS: Lower chest: Chronic cavitary 1.5 cm basilar right lower lobe nodule (series 4/image 13), previously 1.8 cm on 09/27/2014 CT, decreased. Mild scarring at the right lung base. Coronary atherosclerosis. Stable dilated main pulmonary artery (3.5 cm diameter). Hepatobiliary: Normal liver size. Hypodense 1.7 cm posterior right liver lobe mass (series 2/ image 19) and subcentimeter hypodense inferior right liver lobe lesion are stable since 11/15/2010 CT, considered benign. No new liver lesions. Cholecystectomy. Bile ducts are stable and within normal post cholecystectomy limits with common bile duct diameter 8 mm. Pancreas: Heterogeneous fatty infiltration of the pancreas, with no pancreatic mass or duct dilation. Spleen: Normal size. No mass. Adrenals/Urinary Tract: Normal adrenals. Mild right hydroureteronephrosis to the level of the right ureterovesical junction. No left hydronephrosis. Normal caliber left ureter. No renal or ureteral stones. No discrete obstructing mass. No contour  deforming renal masses. Nondistended bladder. Gas in the nondependent bladder lumen. No definite bladder wall thickening accounting for the nondistended state. Stomach/Bowel: Small hiatal hernia. Otherwise normal nondistended stomach. Normal caliber small bowel with no small bowel wall thickening. Appendectomy. Normal large bowel with no diverticulosis, large bowel wall thickening or pericolonic fat stranding. Vascular/Lymphatic: Atherosclerotic nonaneurysmal abdominal aorta . No pathologically enlarged lymph nodes in the abdomen or pelvis. Reproductive: Status post hysterectomy, with no abnormal findings at the vaginal cuff. No adnexal mass. Other: No pneumoperitoneum, ascites or focal fluid collection. Musculoskeletal: No aggressive appearing focal osseous lesions. Marked thoracolumbar spondylosis. IMPRESSION: 1. Mild right hydroureteronephrosis to the level of the right ureterovesical junction. No urolithiasis. No discrete obstructing mass. Consider further evaluation with cystoscopy/ureteroscopy and/or hematuria protocol CT abdomen/pelvis without and with IV contrast. 2. Nonspecific gas in the bladder. If there is no history of recent bladder instrumentation to explain this gas, recommend correlation with urinalysis to exclude gas producing infection . 3. Chronic cavitary pulmonary nodule at the right lung base is decreased in size, compatible with a benign etiology . 4.  Aortic Atherosclerosis (ICD10-I70.0).  Coronary atherosclerosis. 5. Small hiatal hernia . Electronically Signed   By: Ilona Sorrel M.D.   On: 07/27/2017 07:10  Dg Chest Port 1 View  Result Date: 07/26/2017 CLINICAL DATA:  Sepsis. EXAM: PORTABLE CHEST 1 VIEW COMPARISON:  06/18/2017 FINDINGS: Cardiomediastinal silhouette is normal. Mediastinal contours appear intact. Calcific atherosclerotic disease and tortuosity of the aorta. There is no evidence of focal airspace consolidation, pleural effusion or pneumothorax. Diffusely increased  interstitial markings. Minimal peribronchial airspace opacities in the right lower lobe. Osseous structures are without acute abnormality. Soft tissues are grossly normal. IMPRESSION: Minimal peribronchial airspace opacities in the right lower lobe may represent developing bronchopneumonia or areas of atelectasis. Diffuse mildly increased interstitial markings. Electronically Signed   By: Fidela Salisbury M.D.   On: 07/26/2017 13:51    I independently reviewed the above imaging studies.  In addition to the above studies, she did have a contrasted CT scan from 2012 which revealed mild the physiologic dilatation of the right collecting system and ureter without evident obstruction.  I am not able to see the images with exacerbation just the transcribe results.  I am not terribly impressed at right hydronephrosis.  She does have mild dilatation of her ureter.  There is no evidence of stones.  Impression/Assessment:  Recurrent Klebsiella urinary tract infection in a chronically ill/diabetic 83 year old female.  This patient may well have chronic colonization, with occasional obvious symptomatic episodes.  I am not significantly worried about the appearance of the right kidney, especially since this was commented on 6 years ago in the transcribed CT result--that was performed with contrast.2  Plan:  1.  I think it worthwhile to perform a contrasted CT scan of the abdomen and pelvis to assess proper emptying of the kidney with this radiographic call of ureteral dilation.  2.  Long-term, I would treat her like any other postmenopausal 83 year old female with frequent urinary tract infections/chronic colonization.  I would recommend starting her on the Premarin/Estrace cream in the perivaginal area 2-3 nights a week, taking a probiotic on a regular basis (urinary tract infections tend to be recurrent/frequent after antibiotic administration in these ladies), as well as starting her on cranberry capsules 3  nights a week  3.  I would recommend to follow-up in about 3-4 weeks to check her symptoms as well as urinalysis.  4.  We will follow during this hospitalization.

## 2017-07-28 NOTE — Evaluation (Signed)
Physical Therapy Evaluation Patient Details Name: Norma [name] MRN: [id number] DOB: 29-Nov-1933 Today's Date: 07/28/2017   History of Present Illness  Norma [name] is a 83 y.o. female with medical history of diabetes mellitus, CKD stage III, cardiomyopathy, hypertension, and chronic pain syndrome presenting with 2-day history of "not acting like herself" and decreased oral intake.  Her symptoms have progressed to the point where she had difficulty getting out of bed on the morning of July 26, 2017.  In addition, her head continue to worsen.  As result, the patient was brought to the emergency department for further evaluation.  The patient is pleasantly confused, but she is able to answer simple questions.  She denies any chest pain, shortness breath, coughing, hemoptysis, nausea, vomiting, diarrhea, dysuria, hematuria, hematochezia, melena.  Upon EMS arrival, the patient was noted to be hypotensive with a blood pressure of 80/50.  The patient has been complaining of intermittent abdominal pain for 2-3 weeks.  Her abdominal pain is worse in the upper abdomen--epigastric and right upper quadrant.  Her abdominal pain is intermittently worse with eating.  She denies any new medications.  She denies any recent travels or eating any unusual or undercooked foods.  The patient was recently discharged from the hospital after a stay from June 18, 2017 through June 22, 2017 when she was treated for sepsis secondary to UTI and Klebsiella bacteremia.  The patient was discharged home with cephalexin for 10 days.  Clinical Impression  Patient received sitting in bed with HOB elevated, pleasant and willing to work with skilled PT services; noted some shortness of breath at rest however HR/O2 were monitored during session and remained WNL throughout. Patient requires Mod assist for supine to sit, and min guard to maintain upright sitting at EOB due to somewhat of a lean to the R. Able to achieve standing with  min assist and knee blocks but limited by weakness and fatigue. Did not attempt ambulation today. Patient left in bed with HOB elevated and nursing staff educated about need for new linens and replacement of external catheter (external catheter was off of patient when PT entered room). Recommend SNF moving forward.     Follow Up Recommendations SNF    Equipment Recommendations  None recommended by PT    Recommendations for Other Services       Precautions / Restrictions Precautions Precautions: Fall Restrictions Weight Bearing Restrictions: No      Mobility  Bed Mobility Overal bed mobility: Needs Assistance Bed Mobility: Supine to Sit;Sit to Supine     Supine to sit: Mod assist Sit to supine: Min assist      Transfers Overall transfer level: Needs assistance Equipment used: None Transfers: Sit to/from Stand Sit to Stand: Min assist            Ambulation/Gait             General Gait Details: DNT today   Stairs            Wheelchair Mobility    Modified Rankin (Stroke Patients Only)       Balance Overall balance assessment: History of Falls;Needs assistance Sitting-balance support: Bilateral upper extremity supported Sitting balance-Leahy Scale: Fair   Postural control: Right lateral lean Standing balance support: Bilateral upper extremity supported Standing balance-Leahy Scale: Fair                               Pertinent Vitals/Pain Pain Assessment:  No/denies pain    Home Living Family/patient expects to be discharged to:: Unsure(patient is a very poor historian ) Living Arrangements: Spouse/significant other Available Help at Discharge: Family Type of Home: Other(Comment)(unsure, patient is poor historian ) Home Access: Other (comment)(patient unable to recall )     Home Layout: Other (Comment)(unsure, patient poor historian ) Home Equipment: Gilford Rile - 2 wheels Additional Comments: unsure of additional equipemnt,  patient poor historian     Prior Function Level of Independence: Needs assistance   Gait / Transfers Assistance Needed: needs walker for inside the house uses wheelchair outside   ADL's / Homemaking Assistance Needed: dependent on family        Hand Dominance        Extremity/Trunk Assessment   Upper Extremity Assessment Upper Extremity Assessment: Generalized weakness    Lower Extremity Assessment Lower Extremity Assessment: Generalized weakness    Cervical / Trunk Assessment Cervical / Trunk Assessment: Kyphotic  Communication   Communication: No difficulties  Cognition Arousal/Alertness: Awake/alert Behavior During Therapy: WFL for tasks assessed/performed Overall Cognitive Status: Within Functional Limits for tasks assessed                                 General Comments: A&Ox3 but poor historian       General Comments General comments (skin integrity, edema, etc.): easily fatigued, generally short of breath but HR/O2 remain WNL on room air with activity     Exercises     Assessment/Plan    PT Assessment Patient needs continued PT services  PT Problem List Decreased strength;Decreased mobility;Decreased safety awareness;Decreased coordination;Decreased activity tolerance;Decreased balance;Decreased knowledge of use of DME       PT Treatment Interventions Therapeutic activities;Gait training;Therapeutic exercise;Patient/family education;Stair training;Balance training;Functional mobility training;Neuromuscular re-education    PT Goals (Current goals can be found in the Care Plan section)  Acute Rehab PT Goals Patient Stated Goal: to go home  PT Goal Formulation: With patient Time For Goal Achievement: 08/11/17 Potential to Achieve Goals: Fair    Frequency Min 3X/week   Barriers to discharge        Co-evaluation               AM-PAC PT "6 Clicks" Daily Activity  Outcome Measure Difficulty turning over in bed (including  adjusting bedclothes, sheets and blankets)?: Unable Difficulty moving from lying on back to sitting on the side of the bed? : Unable Difficulty sitting down on and standing up from a chair with arms (e.g., wheelchair, bedside commode, etc,.)?: Unable Help needed moving to and from a bed to chair (including a wheelchair)?: A Lot Help needed walking in hospital room?: A Lot Help needed climbing 3-5 steps with a railing? : Total 6 Click Score: 8    End of Session Equipment Utilized During Treatment: Gait belt Activity Tolerance: Patient tolerated treatment well;No increased pain Patient left: in bed;with call bell/phone within reach;with bed alarm set   PT Visit Diagnosis: Unsteadiness on feet (R26.81);Muscle weakness (generalized) (M62.81);History of falling (Z91.81);Difficulty in walking, not elsewhere classified (R26.2)    Time: 5462-7035 PT Time Calculation (min) (ACUTE ONLY): 25 min   Charges:   PT Evaluation $PT Eval Low Complexity: 1 Low PT Treatments $Therapeutic Activity: 8-22 mins   PT G Codes:   PT G-Codes **NOT FOR INPATIENT CLASS** Functional Assessment Tool Used: AM-PAC 6 Clicks Basic Mobility;Clinical judgement Functional Limitation: Mobility: Walking and moving around Mobility: Walking and Moving Around  Current Status [phone]): At least 60 percent but less than 80 percent impaired, limited or restricted Mobility: Walking and Moving Around Goal Status [phone]): At least 40 percent but less than 60 percent impaired, limited or restricted   Deniece Ree PT, DPT, CBIS  Supplemental Physical Therapist [facility]

## 2017-07-28 NOTE — Progress Notes (Addendum)
PROGRESS NOTE  Norma [name] MBT:[id number] DOB: 09/18/1933 DOA: 07/26/2017 PCP: Caryl Bis, MD   Brief History:  83 y.o.femalewith medical history ofdiabetes mellitus, CKD stage III, cardiomyopathy, hypertension, and chronic pain syndrome presenting with 2-day history of "not acting like herself"and decreased oral intake. Her symptoms have progressed to the point where she had difficulty getting out of bed on the morning of July 26, 2017. In addition, her head continue to worsen. As result, the patient was brought to the emergency department for further evaluation. The patient is pleasantly confused, but she is able to answer simple questions. She denies any chest pain, shortness breath, coughing, hemoptysis, nausea, vomiting, diarrhea, dysuria, hematuria, hematochezia, melena.Upon EMS arrival, the patient was noted to be hypotensive with a blood pressure of 80/50. The patient has been complaining of intermittent abdominal pain for 2-3 weeks.Her abdominal pain is worse in the upper abdomen--epigastric and right upper quadrant. Her abdominal pain is intermittently worse with eating. She denies any new medications. She denies any recent travels or eating any unusual or undercooked foods. The patient was recently discharged from the hospital after a stay from June 18, 2017 through June 22, 2017 when she was treated for sepsis secondary to UTI and Klebsiella bacteremia. The patient was discharged home with cephalexin for 10 days    Assessment/Plan: Sepsis -due to a urinary source and bacteremia -d/c vanco, continue ceftriaxone -Lactic acid 0.91 -Check procalcitonin--3.24 -Continue IV fluids -WBC improving -personally reviewed CXR--no consolidation  Klebsiella Bacteremia -urinary source -continue ceftriaxone 2 grams daily -Echo -plan to send home with quinolone if sensitive as it will have 100% bioavailability to finish 14 days  total  Pyelonephritis -Continue ceftriaxone pending culture data  Abdominal pain/Right Hydronephrosis -07/26/17--CT abdomen and pelvis--right hydroureteronephrosis without visible stone.  No bowel wall thickening. -likely due to pyelonephritis -urology consult appreciated-->CT abd/pelvis WITH contrast to assess R-renal emptying; hydroureteronephrosis present previously-->no acute intervention needed -Check TXMIWO--03  Acute metabolic encephalopathy -Secondary to infectious process -Improving with treatment of infection -Holding clonazepam -personally reviewed EKG--sinus, nonspecific T wave changes  Acute on chronic renal failure--CKD stage III -Baseline creatinine 1.5-1.7 -Presenting serum creatinine 2.10 -Secondary to sepsis and volume depletion -Holding lisinopriland lasix -am BMP  RLL cavitary lung nodule -Previously biopsied December 23, 2012--benign  Right liver mass/hypodensities -Stable on imaging since November 15, 2010  Essential hypertension -Restart decreased dose carvedilol holding lisinopril -Secondary to soft blood pressure  Diabetes mellitus type 2, uncontrolled -Increase Levemir to 25 units -Increase to moderate sliding scale -add novolog 4 units with meals -NovoLog sliding scale -Hemoglobin A1c--9.3  Hyperlipidemia -Continue statin  Chronic pain syndrome -continue home dose percocet    Disposition Plan:   SNF 11/14 or 11/15 Family Communication:   spouse updated on phone 11/13  Consultants:  urology  Code Status:  FULL  DVT Prophylaxis:  [place] Heparin    Procedures: As Listed in Progress Note Above  Antibiotics: Ceftriaxone 11/11>>> Vanc 11/11>>>11/12     Subjective: Patient says that abdominal pain is improving.  She had an episode of emesis after lunch.  She denies any fevers, chills, chest pain, shortness breath, diarrhea, dysuria, hematuria.  She denies any headache or neck pain.  Objective: Vitals:   07/27/17  1300 07/27/17 2051 07/28/17 0624 07/28/17 1333  BP: 136/84 (!) 114/44 139/65 131/74  Pulse: 97 90 85 83  Resp: 20 20 20 20   Temp: 98.4 F (36.9 C) 97.9 F (36.6 C) 98.4 F (  36.9 C) 98.2 F (36.8 C)  TempSrc: Oral Axillary Axillary Axillary  SpO2: 96% 96% 98% 96%  Weight:      Height:        Intake/Output Summary (Last 24 hours) at 07/28/2017 1356 Last data filed at 07/28/2017 1200 Gross per 24 hour  Intake 2036.25 ml  Output 1150 ml  Net 886.25 ml   Weight change:  Exam:   General:  Pt is alert, follows commands appropriately, not in acute distress  HEENT: No icterus, No thrush, No neck mass, /AT  Cardiovascular: RRR, S1/S2, no rubs, no gallops  Respiratory: CTA bilaterally, no wheezing, no crackles, no rhonchi  Abdomen: Soft/+BS, non tender, non distended, no guarding  Extremities: No edema, No lymphangitis, No petechiae, No rashes, no synovitis   Data Reviewed: I have personally reviewed following labs and imaging studies Basic Metabolic Panel: Recent Labs  Lab 07/26/17 1150 07/27/17 0526 07/28/17 0554  NA 131* 134* 133*  K 4.2 4.1 3.8  CL 100* 103 103  CO2 21* 21* 22  GLUCOSE 344* 231* 224*  BUN 28* 24* 25*  CREATININE 2.10* 1.74* 1.65*  CALCIUM 8.4* 8.4* 8.6*   Liver Function Tests: Recent Labs  Lab 07/26/17 1150 07/28/17 0554  AST 38 10*  ALT 17 10*  ALKPHOS 155* 139*  BILITOT 1.4* 0.5  PROT 6.8 6.1*  ALBUMIN 2.9* 2.3*   Recent Labs  Lab 07/26/17 1807  LIPASE 16   No results for input(s): AMMONIA in the last 168 hours. Coagulation Profile: Recent Labs  Lab 07/26/17 1150 07/26/17 1807  INR 1.11 1.11   CBC: Recent Labs  Lab 07/26/17 1150 07/27/17 0526 07/28/17 0554  WBC 25.3* 24.7* 20.1*  NEUTROABS 20.6*  --   --   HGB 10.0* 9.1* 9.1*  HCT 30.7* 27.9* 28.2*  MCV 88.7 89.1 88.7  PLT 261 285 305   Cardiac Enzymes: Recent Labs  Lab 07/26/17 1159  TROPONINI <0.03   BNP: Invalid input(s): POCBNP CBG: Recent Labs   Lab 07/27/17 1106 07/27/17 1634 07/27/17 2056 07/28/17 0716 07/28/17 1126  GLUCAP 219* 206* 292* 210* 237*   HbA1C: Recent Labs    07/28/17 0559  HGBA1C 9.3*   Urine analysis:    Component Value Date/Time   COLORURINE YELLOW 07/26/2017 1223   APPEARANCEUR CLOUDY (A) 07/26/2017 1223   LABSPEC 1.012 07/26/2017 1223   PHURINE 5.0 07/26/2017 1223   GLUCOSEU NEGATIVE 07/26/2017 1223   HGBUR MODERATE (A) 07/26/2017 1223   BILIRUBINUR NEGATIVE 07/26/2017 1223   KETONESUR NEGATIVE 07/26/2017 1223   PROTEINUR 100 (A) 07/26/2017 1223   UROBILINOGEN 1.0 04/25/2008 1210   NITRITE NEGATIVE 07/26/2017 1223   LEUKOCYTESUR MODERATE (A) 07/26/2017 1223   Sepsis Labs: @LABRCNTIP (procalcitonin:4,lacticidven:4) ) Recent Results (from the past 240 hour(s))  Culture, blood (Routine x 2)     Status: Abnormal (Preliminary result)   Collection Time: 07/26/17 11:51 AM  Result Value Ref Range Status   Specimen Description BLOOD LEFT WRIST  Final   Special Requests   Final    BOTTLES DRAWN AEROBIC AND ANAEROBIC Blood Culture adequate volume   Culture  Setup Time   Final    GRAM NEGATIVE RODS Gram Stain Report Called to,Read Back By and Verified With: BIVENS,L ON 07/27/17 AT 1830 BY LOY,C PERFORMED AT [facility] GRAM STAIN REVIEWED-AGREE WITH RESULT CRITICAL RESULT CALLED TO, READ BACK BY AND VERIFIED WITH: N.LIGHTNER,RN 0320 07/28/17 M.CAMPBELL    Culture (A)  Final    KLEBSIELLA PNEUMONIAE SUSCEPTIBILITIES TO FOLLOW Performed at [facility]  [facility], [place] [address], [place], [place] [address]    Report Status PENDING  Incomplete  Blood Culture ID Panel (Reflexed)     Status: Abnormal   Collection Time: 07/26/17 11:51 AM  Result Value Ref Range Status   Enterococcus species NOT DETECTED NOT DETECTED Final   Listeria monocytogenes NOT DETECTED NOT DETECTED Final   Staphylococcus species NOT DETECTED NOT DETECTED Final   Staphylococcus aureus NOT DETECTED NOT DETECTED Final   Streptococcus  species NOT DETECTED NOT DETECTED Final   Streptococcus agalactiae NOT DETECTED NOT DETECTED Final   Streptococcus pneumoniae NOT DETECTED NOT DETECTED Final   Streptococcus pyogenes NOT DETECTED NOT DETECTED Final   Acinetobacter baumannii NOT DETECTED NOT DETECTED Final   Enterobacteriaceae species DETECTED (A) NOT DETECTED Final    Comment: Enterobacteriaceae represent a large family of gram-negative bacteria, not a single organism. CRITICAL RESULT CALLED TO, READ BACK BY AND VERIFIED WITH: N.LIGHTNER,RN 0320 07/28/17 M.CAMPBELL    Enterobacter cloacae complex NOT DETECTED NOT DETECTED Final   Escherichia coli NOT DETECTED NOT DETECTED Final   Klebsiella oxytoca NOT DETECTED NOT DETECTED Final   Klebsiella pneumoniae DETECTED (A) NOT DETECTED Final    Comment: CRITICAL RESULT CALLED TO, READ BACK BY AND VERIFIED WITH: N.LIGHTNER,RN 0320 07/28/17 M.CAMPBELL    Proteus species NOT DETECTED NOT DETECTED Final   Serratia marcescens NOT DETECTED NOT DETECTED Final   Carbapenem resistance NOT DETECTED NOT DETECTED Final   Haemophilus influenzae NOT DETECTED NOT DETECTED Final   Neisseria meningitidis NOT DETECTED NOT DETECTED Final   Pseudomonas aeruginosa NOT DETECTED NOT DETECTED Final   Candida albicans NOT DETECTED NOT DETECTED Final   Candida glabrata NOT DETECTED NOT DETECTED Final   Candida krusei NOT DETECTED NOT DETECTED Final   Candida parapsilosis NOT DETECTED NOT DETECTED Final   Candida tropicalis NOT DETECTED NOT DETECTED Final    Comment: Performed at [facility], [place] [address] [place], [place] [address]  Culture, blood (Routine x 2)     Status: None (Preliminary result)   Collection Time: 07/26/17 11:59 AM  Result Value Ref Range Status   Specimen Description RIGHT ANTECUBITAL  Final   Special Requests   Final    BOTTLES DRAWN AEROBIC AND ANAEROBIC Blood Culture adequate volume   Culture NO GROWTH 2 DAYS  Final   Report Status PENDING  Incomplete  Urine  culture     Status: Abnormal (Preliminary result)   Collection Time: 07/26/17 12:23 PM  Result Value Ref Range Status   Specimen Description URINE, CATHETERIZED  Final   Special Requests NONE  Final   Culture >=100,000 COLONIES/mL KLEBSIELLA PNEUMONIAE (A)  Final   Report Status PENDING  Incomplete  MRSA PCR Screening     Status: None   Collection Time: 07/26/17  4:36 PM  Result Value Ref Range Status   MRSA by PCR NEGATIVE NEGATIVE Final    Comment:        The GeneXpert MRSA Assay (FDA approved for NASAL specimens only), is one component of a comprehensive MRSA colonization surveillance program. It is not intended to diagnose MRSA infection nor to guide or monitor treatment for MRSA infections.      Scheduled Meds: . clopidogrel  75 mg Oral Daily  . DULoxetine  60 mg Oral BID  . heparin  5,000 Units Subcutaneous Q8H  . insulin aspart  0-15 Units Subcutaneous TID WC  . insulin aspart  0-5 Units Subcutaneous QHS  . insulin aspart  4 Units  Subcutaneous TID WC  . insulin detemir  25 Units Subcutaneous QHS  . oxyCODONE-acetaminophen  1 tablet Oral QID   And  . oxyCODONE  5 mg Oral QID  . pantoprazole  40 mg Oral QHS  . simvastatin  20 mg Oral QPM   Continuous Infusions: . 0.9 % NaCl with KCl 20 mEq / L 75 mL/hr at 07/28/17 0827  . cefTRIAXone (ROCEPHIN)  IV Stopped (07/27/17 1405)    Procedures/Studies: Ct Abdomen Pelvis Wo Contrast  Result Date: 07/27/2017 CLINICAL DATA:  Inpatient.  Abdominal pain.  Sepsis. EXAM: CT ABDOMEN AND PELVIS WITHOUT CONTRAST TECHNIQUE: Multidetector CT imaging of the abdomen and pelvis was performed following the standard protocol without IV contrast. COMPARISON:  06/18/2017 abdominal radiograph. 11/15/2010 CT abdomen/pelvis. FINDINGS: Lower chest: Chronic cavitary 1.5 cm basilar right lower lobe nodule (series 4/image 13), previously 1.8 cm on 09/27/2014 CT, decreased. Mild scarring at the right lung base. Coronary atherosclerosis. Stable  dilated main pulmonary artery (3.5 cm diameter). Hepatobiliary: Normal liver size. Hypodense 1.7 cm posterior right liver lobe mass (series 2/ image 19) and subcentimeter hypodense inferior right liver lobe lesion are stable since 11/15/2010 CT, considered benign. No new liver lesions. Cholecystectomy. Bile ducts are stable and within normal post cholecystectomy limits with common bile duct diameter 8 mm. Pancreas: Heterogeneous fatty infiltration of the pancreas, with no pancreatic mass or duct dilation. Spleen: Normal size. No mass. Adrenals/Urinary Tract: Normal adrenals. Mild right hydroureteronephrosis to the level of the right ureterovesical junction. No left hydronephrosis. Normal caliber left ureter. No renal or ureteral stones. No discrete obstructing mass. No contour deforming renal masses. Nondistended bladder. Gas in the nondependent bladder lumen. No definite bladder wall thickening accounting for the nondistended state. Stomach/Bowel: Small hiatal hernia. Otherwise normal nondistended stomach. Normal caliber small bowel with no small bowel wall thickening. Appendectomy. Normal large bowel with no diverticulosis, large bowel wall thickening or pericolonic fat stranding. Vascular/Lymphatic: Atherosclerotic nonaneurysmal abdominal aorta . No pathologically enlarged lymph nodes in the abdomen or pelvis. Reproductive: Status post hysterectomy, with no abnormal findings at the vaginal cuff. No adnexal mass. Other: No pneumoperitoneum, ascites or focal fluid collection. Musculoskeletal: No aggressive appearing focal osseous lesions. Marked thoracolumbar spondylosis. IMPRESSION: 1. Mild right hydroureteronephrosis to the level of the right ureterovesical junction. No urolithiasis. No discrete obstructing mass. Consider further evaluation with cystoscopy/ureteroscopy and/or hematuria protocol CT abdomen/pelvis without and with IV contrast. 2. Nonspecific gas in the bladder. If there is no history of recent  bladder instrumentation to explain this gas, recommend correlation with urinalysis to exclude gas producing infection . 3. Chronic cavitary pulmonary nodule at the right lung base is decreased in size, compatible with a benign etiology . 4.  Aortic Atherosclerosis (ICD10-I70.0).  Coronary atherosclerosis. 5. Small hiatal hernia . Electronically Signed   By: Ilona Sorrel M.D.   On: 07/27/2017 07:10   Dg Chest Port 1 View  Result Date: 07/26/2017 CLINICAL DATA:  Sepsis. EXAM: PORTABLE CHEST 1 VIEW COMPARISON:  06/18/2017 FINDINGS: Cardiomediastinal silhouette is normal. Mediastinal contours appear intact. Calcific atherosclerotic disease and tortuosity of the aorta. There is no evidence of focal airspace consolidation, pleural effusion or pneumothorax. Diffusely increased interstitial markings. Minimal peribronchial airspace opacities in the right lower lobe. Osseous structures are without acute abnormality. Soft tissues are grossly normal. IMPRESSION: Minimal peribronchial airspace opacities in the right lower lobe may represent developing bronchopneumonia or areas of atelectasis. Diffuse mildly increased interstitial markings. Electronically Signed   By: Fidela Salisbury M.D.   On:  07/26/2017 13:51    Swan Zayed, DO  [facility] Pager [phone]  If 7PM-7AM, please contact night-coverage [email or web address] Password [email or web address] 07/28/2017, 1:56 PM   LOS: 2 days

## 2017-07-29 ENCOUNTER — Inpatient Hospital Stay (HOSPITAL_COMMUNITY): Payer: Medicare PPO

## 2017-07-29 DIAGNOSIS — N179 Acute kidney failure, unspecified: Secondary | ICD-10-CM

## 2017-07-29 DIAGNOSIS — G9341 Metabolic encephalopathy: Secondary | ICD-10-CM

## 2017-07-29 DIAGNOSIS — A414 Sepsis due to anaerobes: Secondary | ICD-10-CM

## 2017-07-29 DIAGNOSIS — N10 Acute tubulo-interstitial nephritis: Secondary | ICD-10-CM

## 2017-07-29 DIAGNOSIS — R7881 Bacteremia: Secondary | ICD-10-CM

## 2017-07-29 DIAGNOSIS — N390 Urinary tract infection, site not specified: Secondary | ICD-10-CM

## 2017-07-29 DIAGNOSIS — N183 Chronic kidney disease, stage 3 (moderate): Secondary | ICD-10-CM

## 2017-07-29 DIAGNOSIS — E1165 Type 2 diabetes mellitus with hyperglycemia: Secondary | ICD-10-CM

## 2017-07-29 DIAGNOSIS — Z794 Long term (current) use of insulin: Secondary | ICD-10-CM

## 2017-07-29 LAB — BASIC METABOLIC PANEL
ANION GAP: 7 (ref 5–15)
BUN: 23 mg/dL — ABNORMAL HIGH (ref 6–20)
CALCIUM: 8.6 mg/dL — AB (ref 8.9–10.3)
CO2: 23 mmol/L (ref 22–32)
CREATININE: 1.5 mg/dL — AB (ref 0.44–1.00)
Chloride: 102 mmol/L (ref 101–111)
GFR, EST AFRICAN AMERICAN: 36 mL/min — AB (ref >60)
GFR, EST NON AFRICAN AMERICAN: 31 mL/min — AB (ref >60)
Glucose, Bld: 173 mg/dL — ABNORMAL HIGH (ref 65–99)
Potassium: 3.9 mmol/L (ref 3.5–5.1)
SODIUM: 132 mmol/L — AB (ref 135–145)

## 2017-07-29 LAB — GLUCOSE, CAPILLARY
GLUCOSE-CAPILLARY: 151 mg/dL — AB (ref 65–99)
Glucose-Capillary: 103 mg/dL — ABNORMAL HIGH (ref 65–99)

## 2017-07-29 LAB — CBC
HCT: 29.2 % — ABNORMAL LOW (ref 36.0–46.0)
Hemoglobin: 9.5 g/dL — ABNORMAL LOW (ref 12.0–15.0)
MCH: 28.7 pg (ref 26.0–34.0)
MCHC: 32.5 g/dL (ref 30.0–36.0)
MCV: 88.2 fL (ref 78.0–100.0)
PLATELETS: 310 10*3/uL (ref 150–400)
RBC: 3.31 MIL/uL — AB (ref 3.87–5.11)
RDW: 14.9 % (ref 11.5–15.5)
WBC: 14.5 10*3/uL — AB (ref 4.0–10.5)

## 2017-07-29 LAB — URINE CULTURE: Culture: >=100000 — AB

## 2017-07-29 MED ORDER — CRANBERRY 300 MG PO TABS: 300.0000 mg | ORAL_TABLET | ORAL | Status: DC

## 2017-07-29 MED ORDER — INSULIN ASPART 100 UNIT/ML ~~LOC~~ SOLN: 0.0000 [IU] | Freq: Three times a day (TID) | SUBCUTANEOUS | 11 refills | Status: DC

## 2017-07-29 MED ORDER — INSULIN DETEMIR 100 UNIT/ML ~~LOC~~ SOLN: 25.0000 [IU] | Freq: Every day | SUBCUTANEOUS | 11 refills | Status: DC

## 2017-07-29 MED ORDER — CARVEDILOL 6.25 MG PO TABS: 6.2500 mg | ORAL_TABLET | Freq: Two times a day (BID) | ORAL | Status: DC

## 2017-07-29 MED ORDER — CIPROFLOXACIN HCL 500 MG PO TABS
500.0000 mg | ORAL_TABLET | Freq: Every day | ORAL | Status: DC
Start: 2017-07-30 — End: 2017-07-29

## 2017-07-29 MED ORDER — CRANBERRY 300 MG PO TABS: 300.0000 mg | ORAL_TABLET | ORAL | 0 refills | Status: DC

## 2017-07-29 MED ORDER — CARVEDILOL 6.25 MG PO TABS: 6.2500 mg | ORAL_TABLET | Freq: Two times a day (BID) | ORAL | 0 refills | Status: DC

## 2017-07-29 MED ORDER — SACCHAROMYCES BOULARDII 250 MG PO CAPS: 250.0000 mg | ORAL_CAPSULE | Freq: Two times a day (BID) | ORAL | Status: DC

## 2017-07-29 MED ORDER — SACCHAROMYCES BOULARDII 250 MG PO CAPS: 250.0000 mg | ORAL_CAPSULE | Freq: Two times a day (BID) | ORAL | 0 refills | Status: AC

## 2017-07-29 MED ORDER — ACETAMINOPHEN 325 MG PO TABS: 650.0000 mg | ORAL_TABLET | Freq: Four times a day (QID) | ORAL | Status: DC | PRN

## 2017-07-29 MED ORDER — OXYCODONE-ACETAMINOPHEN 10-325 MG PO TABS: 1.0000 | ORAL_TABLET | Freq: Four times a day (QID) | ORAL | 0 refills | Status: DC

## 2017-07-29 MED ORDER — IOPAMIDOL (ISOVUE-300) INJECTION 61%
75.0000 mL | Freq: Once | INTRAVENOUS | Status: AC | PRN
  Administered 2017-07-29: 75 mL via INTRAVENOUS

## 2017-07-29 MED ORDER — CIPROFLOXACIN HCL 500 MG PO TABS: 500.0000 mg | ORAL_TABLET | Freq: Every day | ORAL | 0 refills | Status: DC

## 2017-07-29 MED ORDER — INSULIN ASPART 100 UNIT/ML ~~LOC~~ SOLN: 4.0000 [IU] | Freq: Three times a day (TID) | SUBCUTANEOUS | 11 refills | Status: DC

## 2017-07-29 MED ORDER — INSULIN ASPART 100 UNIT/ML ~~LOC~~ SOLN: 0.0000 [IU] | Freq: Every day | SUBCUTANEOUS | 11 refills | Status: DC

## 2017-07-29 MED ORDER — ESTROGENS, CONJUGATED 0.625 MG/GM VA CREA: 0.5000 g | TOPICAL_CREAM | VAGINAL | 1 refills | Status: DC

## 2017-07-29 MED ORDER — ESTROGENS, CONJUGATED 0.625 MG/GM VA CREA: 0.5000 g | TOPICAL_CREAM | VAGINAL | 1 refills | Status: AC

## 2017-07-29 NOTE — Progress Notes (Signed)
Patient's PICC line removed and site intact. With pressure dressing.  Waiting on patient's daughter to take her home.

## 2017-07-29 NOTE — Progress Notes (Signed)
Physical Therapy Treatment Patient Details Name: Norma [name] MRN: [id number] DOB: 1933/10/27 Today's Date: 07/29/2017    History of Present Illness Norma [name] is a 83 y.o. female with medical history of diabetes mellitus, CKD stage III, cardiomyopathy, hypertension, and chronic pain syndrome presenting with 2-day history of "not acting like herself" and decreased oral intake.  Her symptoms have progressed to the point where she had difficulty getting out of bed on the morning of July 26, 2017.  In addition, her head continue to worsen.  As result, the patient was brought to the emergency department for further evaluation.  The patient is pleasantly confused, but she is able to answer simple questions.  She denies any chest pain, shortness breath, coughing, hemoptysis, nausea, vomiting, diarrhea, dysuria, hematuria, hematochezia, melena.  Upon EMS arrival, the patient was noted to be hypotensive with a blood pressure of 80/50.  The patient has been complaining of intermittent abdominal pain for 2-3 weeks.  Her abdominal pain is worse in the upper abdomen--epigastric and right upper quadrant.  Her abdominal pain is intermittently worse with eating.  She denies any new medications.  She denies any recent travels or eating any unusual or undercooked foods.  The patient was recently discharged from the hospital after a stay from June 18, 2017 through June 22, 2017 when she was treated for sepsis secondary to UTI and Klebsiella bacteremia.  The patient was discharged home with cephalexin for 10 days.    PT Comments    Pt agreeable to treatment this session but admits to being weak and slow.  Pt voiced no pain.  Mod assist and cues with bed mobiltiy.  Increased time to complete all transfers and gait.  Pt required assist to stand due to weakness and max cues to advance LE's with ambulation.  Pt only able to take 5 steps over to chair.  Chair alarm in place.   Follow Up Recommendations   SNF     Equipment Recommendations  None recommended by PT    Recommendations for Other Services       Precautions / Restrictions      Mobility  Bed Mobility Overal bed mobility: Needs Assistance Bed Mobility: Supine to Sit     Supine to sit: Mod assist     General bed mobility comments: verbal cues and assist from therapist to scoot to edge of bed  Transfers Overall transfer level: Needs assistance Equipment used: Rolling walker (2 wheeled) Transfers: Sit to/from Stand Sit to Stand: Min assist         General transfer comment: Pt weak, difficulty getting to standing without assist  Ambulation/Gait Ambulation/Gait assistance: Mod assist Ambulation Distance (Feet): 5 Feet Assistive device: Rolling walker (2 wheeled) Gait Pattern/deviations: Shuffle;Step-to pattern;Trunk flexed;Narrow base of support   Gait velocity interpretation: <1.8 ft/sec, indicative of risk for recurrent falls     Stairs            Wheelchair Mobility    Modified Rankin (Stroke Patients Only)       Balance                                            Cognition Arousal/Alertness: Awake/alert Behavior During Therapy: WFL for tasks assessed/performed Overall Cognitive Status: Within Functional Limits for tasks assessed  General Comments: A&Ox3 but poor historian       Exercises      General Comments        Pertinent Vitals/Pain Pain Assessment: No/denies pain    Home Living                      Prior Function            PT Goals (current goals can now be found in the care plan section) Acute Rehab PT Goals Patient Stated Goal: to go home  Time For Goal Achievement: 08/11/17 Progress towards PT goals: Progressing toward goals    Frequency    Min 3X/week      PT Plan      Co-evaluation              AM-PAC PT "6 Clicks" Daily Activity  Outcome Measure                    End of Session Equipment Utilized During Treatment: Gait belt Activity Tolerance: Patient tolerated treatment well;No increased pain;Patient limited by fatigue Patient left: with call bell/phone within reach;in chair;with chair alarm set Nurse Communication: Mobility status PT Visit Diagnosis: Unsteadiness on feet (R26.81);Muscle weakness (generalized) (M62.81);History of falling (Z91.81);Difficulty in walking, not elsewhere classified (R26.2)     Time: 1340-1415 PT Time Calculation (min) (ACUTE ONLY): 35 min  Charges:  $Gait Training: 8-22 mins $Therapeutic Activity: 8-22 mins                    G Codes:       Teena Irani, PTA/CLT [place],  Paone B 07/29/2017, 2:43 PM

## 2017-07-29 NOTE — Discharge Instructions (Signed)
Check you weight daily and use Lasix if weight increases by 3 lb in 24 hrs.   Please take all your medications with you for your next visit with your Primary MD. Please request your Primary MD to go over all hospital test results at the follow up. Please ask your Primary MD to get all Hospital records sent to his/her office.  If you experience worsening of your admission symptoms, develop shortness of breath, chest pain, suicidal or homicidal thoughts or a life threatening emergency, you must seek medical attention immediately by calling 911 or calling your MD.  Dennis Bast must read the complete instructions/literature along with all the possible adverse reactions/side effects for all the medicines you take including new medications that have been prescribed to you. Take new medicines after you have completely understood and accpet all the possible adverse reactions/side effects.   Do not drive when taking pain medications or sedatives.    Do not take more than prescribed Pain, Sleep and Anxiety Medications  If you have smoked or chewed Tobacco in the last 2 yrs please stop. Stop any regular alcohol and or recreational drug use.  Wear Seat belts while driving.

## 2017-07-29 NOTE — Discharge Summary (Signed)
Physician Discharge Summary  Norma [name] NWG:[id number] DOB: 1934-09-09 DOA: 07/26/2017  PCP: Caryl Bis, MD  Admit date: 07/26/2017 Discharge date: 07/29/2017  Admitted From: home Disposition:  SNF   Recommendations for Outpatient Follow-up:  1. Bmet in 2-3 days to f/u on renal function after contrast- adjust dose of antibiotic based on change in renal function 2. Last dose of Cipro on 11/24 3. Follow BP and adjust medications as needed- Lisinopril still on hold 4. Daily weights- Use Lasix PRN gain in 3 lb in 24 hrs- extra dose of KCL may need to be ordered 5. F/u with Dr Diona Fanti in 3-4 wks  Discharge Condition:  stable   CODE STATUS:  DNR   Diet recommendation:  Diabetic, low sodium, heart healthy Consultations:  Urology    Discharge Diagnoses:  Principal Problem:   Klebsiella sepsis ([place]) /  Bacteremia due to Klebsiella pneumoniae Active Problems:   Acute pyelonephritis   Acute metabolic encephalopathy   Acute renal failure superimposed on stage 3 chronic kidney disease ([place])   Uncontrolled type 2 diabetes mellitus with hyperglycemia, with long-term current use of insulin (HCC)   Pulmonary nodule  Essential HTN Chronic pain    Subjective: No complaints other than severe weakness.  HPI: 83 y.o.femalewith medical history ofdiabetes mellitus, CKD stage III, cardiomyopathy, hypertension, and chronic pain syndrome presenting with 2-day history of "not acting like herself"and decreased oral intake. Her symptoms have progressed to the point where she had difficulty getting out of bed on the morning of July 26, 2017. In addition, her head continue to worsen. As result, the patient was brought to the emergency department for further evaluation. The patient is pleasantly confused, but she is able to answer simple questions. She denies any chest pain, shortness breath, coughing, hemoptysis, nausea, vomiting, diarrhea, dysuria, hematuria, hematochezia,  melena. Upon EMS arrival, the patient was noted to be hypotensive with a blood pressure of 80/50. The patient has been complaining of intermittent abdominal pain for 2-3 weeks.Her abdominal pain is worse in the upper abdomen--epigastric and right upper quadrant. Her abdominal pain is intermittently worse with eating. She denies any new medications. She denies any recent travels or eating any unusual or undercooked foods. The patient was recently discharged from the hospital after a stay from June 18, 2017 through June 22, 2017 when she was treated for sepsis secondary to UTI and Klebsiella bacteremia. The patient was discharged home with cephalexin for 10 days     Hospital Course:  Sepsis/ Klebsiella Bacteremia and R Pyelonephritis in setting of Right hydronephosis  - initially given vanc & ceftriaxone and then Vanc discontinued- Ceftriaxone changed to Cipro 11/13 -Lactic acid 0.91 - procalcitonin--3.24 - Leukocytosis improving- see below -2 D Echo unrevealing - cultures >> K pneumoniae sensitive to Cipro - further recommendations by Urology, Dr Diona Fanti: 1. Repeat CT- see below 2. "Premarin/Estrace cream in the perivaginal area 2-3 nights a week, taking a probiotic on a regular basis (urinary tract infections tend to be recurrent/frequent after antibiotic administration in these ladies), as well as starting her on cranberry capsules 3 nights a week"  Abdominal pain/Right Hydronephrosis -07/26/17--CT abdomen and pelvis--right hydroureteronephrosis without visible stone. No bowel wall thickening. -urology recommended CT abd/pelvis WITH contrast which shows: "diffuse thickening along the walls of the right renal collecting system in most of the right ureter"  Neoplasm?, air in urinary bladder- NO abscess   Acute metabolic encephalopathy -Secondary to infectious process -Improving with treatment of infection - clonazepam has been on hold  Acute on chronic renal  failure--CKD stage III -Baseline creatinine 1.5-1.7 -Presenting serum creatinine 2.10 -Secondary to sepsis and volume depletion -Holding lisinopriland lasix (PRN)   RLLcavitary lung nodule -Previously biopsied December 23, 2012--benign  Right liver mass/hypodensities -Stable on imaging since November 15, 2010  Essential hypertension- BP has not been severely elevated   - holding lisinopril, Lasix and Carvedilol - can resume Carvedilol at 6.25 mg BID instead of1 2.5 BID   Diabetes mellitus type 2, uncontrolled --Hemoglobin A1c--9.3 -Increased Levemir to 25 units -moderate sliding scale and novolog 4 units with meals -NovoLog sliding scale  Hyperlipidemia -Continue statin  Chronic pain syndrome -continue home dose percocet   Discharge Instructions  Discharge Instructions    Increase activity slowly   Complete by:  As directed      Allergies as of 07/29/2017   No Known Allergies     Medication List    STOP taking these medications   clonazePAM 0.5 MG tablet Commonly known as:  KLONOPIN   lisinopril 20 MG tablet Commonly known as:  PRINIVIL,ZESTRIL   meclizine 25 MG tablet Commonly known as:  ANTIVERT     TAKE these medications   acetaminophen 325 MG tablet Commonly known as:  TYLENOL Take 2 tablets (650 mg total) every 6 (six) hours as needed by mouth for mild pain (or Fever >/= 101).   carvedilol 6.25 MG tablet Commonly known as:  COREG Take 1 tablet (6.25 mg total) 2 (two) times daily with a meal by mouth. What changed:    medication strength  how much to take   ciprofloxacin 500 MG tablet Commonly known as:  CIPRO Take 1 tablet (500 mg total) daily by mouth. Start taking on:  07/30/2017   clopidogrel 75 MG tablet Commonly known as:  PLAVIX Take 75 mg by mouth daily.   conjugated estrogens vaginal cream Commonly known as:  PREMARIN Place 8.93 Applicatorfuls every other day vaginally.   Cranberry 300 MG tablet Take 1 tablet (300 mg total)  every other day by mouth.   DULoxetine 60 MG capsule Commonly known as:  CYMBALTA Take 60 mg by mouth 2 (two) times daily.   furosemide 40 MG tablet Commonly known as:  LASIX Take 40 mg by mouth daily as needed for fluid.   insulin aspart 100 UNIT/ML injection Commonly known as:  novoLOG Inject 0-15 Units 3 (three) times daily with meals into the skin.   insulin aspart 100 UNIT/ML injection Commonly known as:  novoLOG Inject 0-5 Units at bedtime into the skin.   insulin aspart 100 UNIT/ML injection Commonly known as:  novoLOG Inject 4 Units 3 (three) times daily with meals into the skin.   insulin detemir 100 UNIT/ML injection Commonly known as:  LEVEMIR Inject 0.25 mLs (25 Units total) at bedtime into the skin. What changed:  how much to take   oxyCODONE-acetaminophen 10-325 MG tablet Commonly known as:  PERCOCET Take 1 tablet 4 (four) times daily by mouth.   pantoprazole 40 MG tablet Commonly known as:  PROTONIX Take 1 tablet by mouth at bedtime.   potassium chloride 10 MEQ tablet Commonly known as:  K-DUR Take 1 tablet by mouth daily.   promethazine 25 MG tablet Commonly known as:  PHENERGAN Take 1 tablet by mouth every 4 (four) hours as needed.   saccharomyces boulardii 250 MG capsule Commonly known as:  FLORASTOR Take 1 capsule (250 mg total) 2 (two) times daily by mouth.   simvastatin 20 MG tablet Commonly known as:  ZOCOR  Take 20 mg by mouth every evening.      Follow-up Information    [facility] Follow up.   Specialty:  Home Health Services Contact information: [place] [address] [phone]          No Known Allergies   Procedures/Studies: 07/28/17 2 D ECHO Left ventricle: The cavity size was normal. Wall thickness was   increased in a pattern of mild LVH. Systolic function was normal.   The estimated ejection fraction was in the range of 60% to 65%.   Wall motion was normal; there were no  regional wall motion   abnormalities. Diastolic dysfunction, grade indeterminate.   Doppler parameters are consistent with high ventricular filling   pressure. - Mitral valve: There was mild regurgitation. - Tricuspid valve: There was mild regurgitation. - Pulmonary arteries: PA peak pressure: 37 mm Hg (S).   Ct Abdomen Pelvis Wo Contrast  Result Date: 07/27/2017 CLINICAL DATA:  Inpatient.  Abdominal pain.  Sepsis. EXAM: CT ABDOMEN AND PELVIS WITHOUT CONTRAST TECHNIQUE: Multidetector CT imaging of the abdomen and pelvis was performed following the standard protocol without IV contrast. COMPARISON:  06/18/2017 abdominal radiograph. 11/15/2010 CT abdomen/pelvis. FINDINGS: Lower chest: Chronic cavitary 1.5 cm basilar right lower lobe nodule (series 4/image 13), previously 1.8 cm on 09/27/2014 CT, decreased. Mild scarring at the right lung base. Coronary atherosclerosis. Stable dilated main pulmonary artery (3.5 cm diameter). Hepatobiliary: Normal liver size. Hypodense 1.7 cm posterior right liver lobe mass (series 2/ image 19) and subcentimeter hypodense inferior right liver lobe lesion are stable since 11/15/2010 CT, considered benign. No new liver lesions. Cholecystectomy. Bile ducts are stable and within normal post cholecystectomy limits with common bile duct diameter 8 mm. Pancreas: Heterogeneous fatty infiltration of the pancreas, with no pancreatic mass or duct dilation. Spleen: Normal size. No mass. Adrenals/Urinary Tract: Normal adrenals. Mild right hydroureteronephrosis to the level of the right ureterovesical junction. No left hydronephrosis. Normal caliber left ureter. No renal or ureteral stones. No discrete obstructing mass. No contour deforming renal masses. Nondistended bladder. Gas in the nondependent bladder lumen. No definite bladder wall thickening accounting for the nondistended state. Stomach/Bowel: Small hiatal hernia. Otherwise normal nondistended stomach. Normal caliber small bowel  with no small bowel wall thickening. Appendectomy. Normal large bowel with no diverticulosis, large bowel wall thickening or pericolonic fat stranding. Vascular/Lymphatic: Atherosclerotic nonaneurysmal abdominal aorta . No pathologically enlarged lymph nodes in the abdomen or pelvis. Reproductive: Status post hysterectomy, with no abnormal findings at the vaginal cuff. No adnexal mass. Other: No pneumoperitoneum, ascites or focal fluid collection. Musculoskeletal: No aggressive appearing focal osseous lesions. Marked thoracolumbar spondylosis. IMPRESSION: 1. Mild right hydroureteronephrosis to the level of the right ureterovesical junction. No urolithiasis. No discrete obstructing mass. Consider further evaluation with cystoscopy/ureteroscopy and/or hematuria protocol CT abdomen/pelvis without and with IV contrast. 2. Nonspecific gas in the bladder. If there is no history of recent bladder instrumentation to explain this gas, recommend correlation with urinalysis to exclude gas producing infection . 3. Chronic cavitary pulmonary nodule at the right lung base is decreased in size, compatible with a benign etiology . 4.  Aortic Atherosclerosis (ICD10-I70.0).  Coronary atherosclerosis. 5. Small hiatal hernia . Electronically Signed   By: Ilona Sorrel M.D.   On: 07/27/2017 07:10   Ct Abdomen Pelvis W Contrast  Result Date: 07/29/2017 CLINICAL DATA:  Recent right-sided hydronephrosis EXAM: CT ABDOMEN AND PELVIS WITH CONTRAST TECHNIQUE: Multidetector CT imaging of the abdomen and pelvis was performed  using the standard protocol following bolus administration of intravenous contrast. CONTRAST:  32mL ISOVUE-300 IOPAMIDOL (ISOVUE-300) INJECTION 61% COMPARISON:  July 26, 2017 FINDINGS: Lower chest: There is a small right pleural effusion with bibasilar atelectasis. There are foci of coronary artery calcification. There is a small hiatal hernia. Hepatobiliary: There is a stable 8 mm area of decreased attenuation in  the posterior segment right lobe of the liver consistent with either a cyst or hamartoma. A 1 cm area of decreased attenuation is seen more medially in the posterior segment right lobe of the liver, stable. No new liver lesion evident. Gallbladder is absent. There is intrahepatic biliary duct dilatation. Common bile duct measures 10 mm, upper limits normal. No biliary duct mass or calculus evident. Pancreas: There is fatty replacement throughout the pancreas. No pancreatic lesions are evident. Spleen: There is a calcified granuloma in the spleen. Spleen otherwise appears unremarkable. Adrenals/Urinary Tract: Adrenals appear normal bilaterally. There is a 1.5 x 1.5 cm cyst in the medial upper pole right kidney. There is a 1.2 x 1.0 cm cyst in the mid right kidney. There is subtly less enhancement of the right kidney compared to the left with mild perinephric stranding bilaterally. There remains fullness of the right renal collecting system and ureter to its distal aspect, stable compared to 3 days prior. There is thickening along the right ureter and collecting system, likely due to inflammatory/infectious etiology. No well-defined abscess seen. No well-defined non cystic mass evident. There is no hydronephrosis on the left. There are no renal or ureteral calculi on either side. Urinary bladder is midline. A small amount of air is noted within the urinary bladder anteriorly, less than on recent study. There is no appreciable urinary bladder wall thickening. Stomach/Bowel: Rectum is distended with stool and air. There is no appreciable bowel wall or mesenteric thickening. No bowel obstruction. No free air or portal venous air. There is lipomatous infiltration of the ileocecal valve. Vascular/Lymphatic: There is atherosclerotic calcification in the aorta and common iliac arteries. No evident aneurysm. There is calcification in proximal mesenteric artery vessels without evidence suggesting mesenteric arterial vessel  occlusion or high-grade obstruction. No adenopathy evident in the abdomen or pelvis. Reproductive: Uterus is absent.  No evident pelvic mass. Other: Appendix absent. No abscess or ascites evident in the abdomen or pelvis. Musculoskeletal: There is degenerative change in lower thoracic and lumbar spine regions. There are no blastic or lytic bone lesions. No intramuscular or abdominal wall lesion. IMPRESSION: 1. Persistent diffuse thickening along the walls of the right renal collecting system in most of the right ureter. Suspect inflammatory/potentially infectious etiology for this appearance, although transitional cell carcinoma and may present in this manner. No renal or ureteral calculus evident. Mild fullness of the right renal collecting system remains, stable. Given concern for possible neoplasm, urologic consultation is advised with consideration for tissue sampling along the collecting system and right ureteral walls. 2. Small amount of air in urinary bladder. This air may have been introduced via instrumentation. If patient has not been instrumented recently, gas-forming organism must be of concern. Appropriate urine cultures advised in this regard. 3. No bowel obstruction. No abscess. Appendix absent. Lipomatous infiltration of the ileocecal valve appears benign. 4. Aortoiliac atherosclerosis. There are foci of coronary artery calcification. 5. Gallbladder absent. There is intrahepatic biliary duct dilatation. Common bile duct upper normal. No biliary duct mass or calculus evident. 6.  Diffuse fatty infiltration in pancreas. Aortic Atherosclerosis (ICD10-I70.0). Electronically Signed   By: Lowella Grip III  M.D.   On: 07/29/2017 12:06   Dg Chest Port 1 View  Result Date: 07/26/2017 CLINICAL DATA:  Sepsis. EXAM: PORTABLE CHEST 1 VIEW COMPARISON:  06/18/2017 FINDINGS: Cardiomediastinal silhouette is normal. Mediastinal contours appear intact. Calcific atherosclerotic disease and tortuosity of the  aorta. There is no evidence of focal airspace consolidation, pleural effusion or pneumothorax. Diffusely increased interstitial markings. Minimal peribronchial airspace opacities in the right lower lobe. Osseous structures are without acute abnormality. Soft tissues are grossly normal. IMPRESSION: Minimal peribronchial airspace opacities in the right lower lobe may represent developing bronchopneumonia or areas of atelectasis. Diffuse mildly increased interstitial markings. Electronically Signed   By: Fidela Salisbury M.D.   On: 07/26/2017 13:51       Discharge Exam: Vitals:   07/28/17 2108 07/29/17 0523  BP: 138/66 (!) 155/77  Pulse: 84 90  Resp: 20 20  Temp: 99.4 F (37.4 C) 99.1 F (37.3 C)  SpO2: 95% 99%   Vitals:   07/28/17 1333 07/28/17 2015 07/28/17 2108 07/29/17 0523  BP: 131/74  138/66 (!) 155/77  Pulse: 83  84 90  Resp: 20  20 20   Temp: 98.2 F (36.8 C)  99.4 F (37.4 C) 99.1 F (37.3 C)  TempSrc: Axillary  Oral Oral  SpO2: 96% 93% 95% 99%  Weight:      Height:        General: Pt is alert, awake, not in acute distress Cardiovascular: RRR, S1/S2 +, no rubs, no gallops Respiratory: CTA bilaterally, no wheezing, no rhonchi Abdominal: Soft, NT, ND, bowel sounds + Extremities: no edema, no cyanosis    The results of significant diagnostics from this hospitalization (including imaging, microbiology, ancillary and laboratory) are listed below for reference.     Microbiology: Recent Results (from the past 240 hour(s))  Culture, blood (Routine x 2)     Status: Abnormal (Preliminary result)   Collection Time: 07/26/17 11:51 AM  Result Value Ref Range Status   Specimen Description BLOOD LEFT WRIST  Final   Special Requests   Final    BOTTLES DRAWN AEROBIC AND ANAEROBIC Blood Culture adequate volume   Culture  Setup Time   Final    GRAM NEGATIVE RODS Gram Stain Report Called to,Read Back By and Verified With: BIVENS,L ON 07/27/17 AT 1830 BY LOY,C PERFORMED AT  [facility] CRITICAL RESULT CALLED TO, READ BACK BY AND VERIFIED WITH: N.LIGHTNER,RN 0320 07/28/17 M.CAMPBELL IN BOTH AEROBIC AND ANAEROBIC BOTTLES Performed at [facility], [place] [address] [place], [place] [address]    Culture KLEBSIELLA PNEUMONIAE (A)  Final   Report Status PENDING  Incomplete   Organism ID, Bacteria KLEBSIELLA PNEUMONIAE  Final      Susceptibility   Klebsiella pneumoniae - MIC*    AMPICILLIN >=32 RESISTANT Resistant     CEFAZOLIN <=4 SENSITIVE Sensitive     CEFEPIME <=1 SENSITIVE Sensitive     CEFTAZIDIME <=1 SENSITIVE Sensitive     CEFTRIAXONE <=1 SENSITIVE Sensitive     CIPROFLOXACIN <=0.25 SENSITIVE Sensitive     GENTAMICIN <=1 SENSITIVE Sensitive     IMIPENEM <=0.25 SENSITIVE Sensitive     TRIMETH/SULFA <=20 SENSITIVE Sensitive     AMPICILLIN/SULBACTAM >=32 RESISTANT Resistant     PIP/TAZO 32 INTERMEDIATE Intermediate     Extended ESBL NEGATIVE Sensitive     * KLEBSIELLA PNEUMONIAE  Blood Culture ID Panel (Reflexed)     Status: Abnormal   Collection Time: 07/26/17 11:51 AM  Result Value Ref Range Status   Enterococcus species NOT DETECTED  NOT DETECTED Final   Listeria monocytogenes NOT DETECTED NOT DETECTED Final   Staphylococcus species NOT DETECTED NOT DETECTED Final   Staphylococcus aureus NOT DETECTED NOT DETECTED Final   Streptococcus species NOT DETECTED NOT DETECTED Final   Streptococcus agalactiae NOT DETECTED NOT DETECTED Final   Streptococcus pneumoniae NOT DETECTED NOT DETECTED Final   Streptococcus pyogenes NOT DETECTED NOT DETECTED Final   Acinetobacter baumannii NOT DETECTED NOT DETECTED Final   Enterobacteriaceae species DETECTED (A) NOT DETECTED Final    Comment: Enterobacteriaceae represent a large family of gram-negative bacteria, not a single organism. CRITICAL RESULT CALLED TO, READ BACK BY AND VERIFIED WITH: N.LIGHTNER,RN 0320 07/28/17 M.CAMPBELL    Enterobacter cloacae complex NOT DETECTED NOT DETECTED Final   Escherichia coli NOT  DETECTED NOT DETECTED Final   Klebsiella oxytoca NOT DETECTED NOT DETECTED Final   Klebsiella pneumoniae DETECTED (A) NOT DETECTED Final    Comment: CRITICAL RESULT CALLED TO, READ BACK BY AND VERIFIED WITH: N.LIGHTNER,RN 0320 07/28/17 M.CAMPBELL    Proteus species NOT DETECTED NOT DETECTED Final   Serratia marcescens NOT DETECTED NOT DETECTED Final   Carbapenem resistance NOT DETECTED NOT DETECTED Final   Haemophilus influenzae NOT DETECTED NOT DETECTED Final   Neisseria meningitidis NOT DETECTED NOT DETECTED Final   Pseudomonas aeruginosa NOT DETECTED NOT DETECTED Final   Candida albicans NOT DETECTED NOT DETECTED Final   Candida glabrata NOT DETECTED NOT DETECTED Final   Candida krusei NOT DETECTED NOT DETECTED Final   Candida parapsilosis NOT DETECTED NOT DETECTED Final   Candida tropicalis NOT DETECTED NOT DETECTED Final    Comment: Performed at [facility], [place] [address] [place], [place] [address]  Culture, blood (Routine x 2)     Status: None (Preliminary result)   Collection Time: 07/26/17 11:59 AM  Result Value Ref Range Status   Specimen Description RIGHT ANTECUBITAL  Final   Special Requests   Final    BOTTLES DRAWN AEROBIC AND ANAEROBIC Blood Culture adequate volume   Culture NO GROWTH 3 DAYS  Final   Report Status PENDING  Incomplete  Urine culture     Status: Abnormal   Collection Time: 07/26/17 12:23 PM  Result Value Ref Range Status   Specimen Description URINE, CATHETERIZED  Final   Special Requests NONE  Final   Culture >=100,000 COLONIES/mL KLEBSIELLA PNEUMONIAE (A)  Final   Report Status 07/29/2017 FINAL  Final   Organism ID, Bacteria KLEBSIELLA PNEUMONIAE (A)  Final      Susceptibility   Klebsiella pneumoniae - MIC*    AMPICILLIN >=32 RESISTANT Resistant     CEFAZOLIN <=4 SENSITIVE Sensitive     CEFTRIAXONE <=1 SENSITIVE Sensitive     CIPROFLOXACIN <=0.25 SENSITIVE Sensitive     GENTAMICIN <=1 SENSITIVE Sensitive     IMIPENEM <=0.25 SENSITIVE  Sensitive     NITROFURANTOIN 64 INTERMEDIATE Intermediate     TRIMETH/SULFA <=20 SENSITIVE Sensitive     AMPICILLIN/SULBACTAM >=32 RESISTANT Resistant     PIP/TAZO 32 INTERMEDIATE Intermediate     Extended ESBL NEGATIVE Sensitive     * >=100,000 COLONIES/mL KLEBSIELLA PNEUMONIAE  MRSA PCR Screening     Status: None   Collection Time: 07/26/17  4:36 PM  Result Value Ref Range Status   MRSA by PCR NEGATIVE NEGATIVE Final    Comment:        The GeneXpert MRSA Assay (FDA approved for NASAL specimens only), is one component of a comprehensive MRSA colonization surveillance program. It is not intended to  diagnose MRSA infection nor to guide or monitor treatment for MRSA infections.      Labs: BNP (last 3 results) No results for input(s): BNP in the last 8760 hours. Basic Metabolic Panel: Recent Labs  Lab 07/26/17 1150 07/27/17 0526 07/28/17 0554 07/29/17 0608  NA 131* 134* 133* 132*  K 4.2 4.1 3.8 3.9  CL 100* 103 103 102  CO2 21* 21* 22 23  GLUCOSE 344* 231* 224* 173*  BUN 28* 24* 25* 23*  CREATININE 2.10* 1.74* 1.65* 1.50*  CALCIUM 8.4* 8.4* 8.6* 8.6*   Liver Function Tests: Recent Labs  Lab 07/26/17 1150 07/28/17 0554  AST 38 10*  ALT 17 10*  ALKPHOS 155* 139*  BILITOT 1.4* 0.5  PROT 6.8 6.1*  ALBUMIN 2.9* 2.3*   Recent Labs  Lab 07/26/17 1807  LIPASE 16   No results for input(s): AMMONIA in the last 168 hours. CBC: Recent Labs  Lab 07/26/17 1150 07/27/17 0526 07/28/17 0554 07/29/17 0608  WBC 25.3* 24.7* 20.1* 14.5*  NEUTROABS 20.6*  --   --   --   HGB 10.0* 9.1* 9.1* 9.5*  HCT 30.7* 27.9* 28.2* 29.2*  MCV 88.7 89.1 88.7 88.2  PLT 261 285 305 310   Cardiac Enzymes: Recent Labs  Lab 07/26/17 1159  TROPONINI <0.03   BNP: Invalid input(s): POCBNP CBG: Recent Labs  Lab 07/28/17 1126 07/28/17 1624 07/28/17 2109 07/29/17 0757 07/29/17 1156  GLUCAP 237* 197* 149* 151* 103*   D-Dimer No results for input(s): DDIMER in the last 72  hours. Hgb A1c Recent Labs    07/28/17 0559  HGBA1C 9.3*   Lipid Profile No results for input(s): CHOL, HDL, LDLCALC, TRIG, CHOLHDL, LDLDIRECT in the last 72 hours. Thyroid function studies No results for input(s): TSH, T4TOTAL, T3FREE, THYROIDAB in the last 72 hours.  Invalid input(s): FREET3 Anemia work up No results for input(s): VITAMINB12, FOLATE, FERRITIN, TIBC, IRON, RETICCTPCT in the last 72 hours. Urinalysis    Component Value Date/Time   COLORURINE YELLOW 07/26/2017 1223   APPEARANCEUR CLOUDY (A) 07/26/2017 1223   LABSPEC 1.012 07/26/2017 1223   PHURINE 5.0 07/26/2017 1223   GLUCOSEU NEGATIVE 07/26/2017 1223   HGBUR MODERATE (A) 07/26/2017 1223   BILIRUBINUR NEGATIVE 07/26/2017 1223   KETONESUR NEGATIVE 07/26/2017 1223   PROTEINUR 100 (A) 07/26/2017 1223   UROBILINOGEN 1.0 04/25/2008 1210   NITRITE NEGATIVE 07/26/2017 1223   LEUKOCYTESUR MODERATE (A) 07/26/2017 1223   Sepsis Labs Invalid input(s): PROCALCITONIN,  WBC,  LACTICIDVEN Microbiology Recent Results (from the past 240 hour(s))  Culture, blood (Routine x 2)     Status: Abnormal (Preliminary result)   Collection Time: 07/26/17 11:51 AM  Result Value Ref Range Status   Specimen Description BLOOD LEFT WRIST  Final   Special Requests   Final    BOTTLES DRAWN AEROBIC AND ANAEROBIC Blood Culture adequate volume   Culture  Setup Time   Final    GRAM NEGATIVE RODS Gram Stain Report Called to,Read Back By and Verified With: BIVENS,L ON 07/27/17 AT 1830 BY LOY,C PERFORMED AT [facility] CRITICAL RESULT CALLED TO, READ BACK BY AND VERIFIED WITH: N.LIGHTNER,RN 0320 07/28/17 M.CAMPBELL IN BOTH AEROBIC AND ANAEROBIC BOTTLES Performed at [facility], [place] [address], [place], [place] [address]    Culture KLEBSIELLA PNEUMONIAE (A)  Final   Report Status PENDING  Incomplete   Organism ID, Bacteria KLEBSIELLA PNEUMONIAE  Final      Susceptibility   Klebsiella pneumoniae - MIC*    AMPICILLIN >=  32 RESISTANT Resistant      CEFAZOLIN <=4 SENSITIVE Sensitive     CEFEPIME <=1 SENSITIVE Sensitive     CEFTAZIDIME <=1 SENSITIVE Sensitive     CEFTRIAXONE <=1 SENSITIVE Sensitive     CIPROFLOXACIN <=0.25 SENSITIVE Sensitive     GENTAMICIN <=1 SENSITIVE Sensitive     IMIPENEM <=0.25 SENSITIVE Sensitive     TRIMETH/SULFA <=20 SENSITIVE Sensitive     AMPICILLIN/SULBACTAM >=32 RESISTANT Resistant     PIP/TAZO 32 INTERMEDIATE Intermediate     Extended ESBL NEGATIVE Sensitive     * KLEBSIELLA PNEUMONIAE  Blood Culture ID Panel (Reflexed)     Status: Abnormal   Collection Time: 07/26/17 11:51 AM  Result Value Ref Range Status   Enterococcus species NOT DETECTED NOT DETECTED Final   Listeria monocytogenes NOT DETECTED NOT DETECTED Final   Staphylococcus species NOT DETECTED NOT DETECTED Final   Staphylococcus aureus NOT DETECTED NOT DETECTED Final   Streptococcus species NOT DETECTED NOT DETECTED Final   Streptococcus agalactiae NOT DETECTED NOT DETECTED Final   Streptococcus pneumoniae NOT DETECTED NOT DETECTED Final   Streptococcus pyogenes NOT DETECTED NOT DETECTED Final   Acinetobacter baumannii NOT DETECTED NOT DETECTED Final   Enterobacteriaceae species DETECTED (A) NOT DETECTED Final    Comment: Enterobacteriaceae represent a large family of gram-negative bacteria, not a single organism. CRITICAL RESULT CALLED TO, READ BACK BY AND VERIFIED WITH: N.LIGHTNER,RN 0320 07/28/17 M.CAMPBELL    Enterobacter cloacae complex NOT DETECTED NOT DETECTED Final   Escherichia coli NOT DETECTED NOT DETECTED Final   Klebsiella oxytoca NOT DETECTED NOT DETECTED Final   Klebsiella pneumoniae DETECTED (A) NOT DETECTED Final    Comment: CRITICAL RESULT CALLED TO, READ BACK BY AND VERIFIED WITH: N.LIGHTNER,RN 0320 07/28/17 M.CAMPBELL    Proteus species NOT DETECTED NOT DETECTED Final   Serratia marcescens NOT DETECTED NOT DETECTED Final   Carbapenem resistance NOT DETECTED NOT DETECTED Final   Haemophilus influenzae NOT  DETECTED NOT DETECTED Final   Neisseria meningitidis NOT DETECTED NOT DETECTED Final   Pseudomonas aeruginosa NOT DETECTED NOT DETECTED Final   Candida albicans NOT DETECTED NOT DETECTED Final   Candida glabrata NOT DETECTED NOT DETECTED Final   Candida krusei NOT DETECTED NOT DETECTED Final   Candida parapsilosis NOT DETECTED NOT DETECTED Final   Candida tropicalis NOT DETECTED NOT DETECTED Final    Comment: Performed at [facility], [place] [address] [place], [place] [address]  Culture, blood (Routine x 2)     Status: None (Preliminary result)   Collection Time: 07/26/17 11:59 AM  Result Value Ref Range Status   Specimen Description RIGHT ANTECUBITAL  Final   Special Requests   Final    BOTTLES DRAWN AEROBIC AND ANAEROBIC Blood Culture adequate volume   Culture NO GROWTH 3 DAYS  Final   Report Status PENDING  Incomplete  Urine culture     Status: Abnormal   Collection Time: 07/26/17 12:23 PM  Result Value Ref Range Status   Specimen Description URINE, CATHETERIZED  Final   Special Requests NONE  Final   Culture >=100,000 COLONIES/mL KLEBSIELLA PNEUMONIAE (A)  Final   Report Status 07/29/2017 FINAL  Final   Organism ID, Bacteria KLEBSIELLA PNEUMONIAE (A)  Final      Susceptibility   Klebsiella pneumoniae - MIC*    AMPICILLIN >=32 RESISTANT Resistant     CEFAZOLIN <=4 SENSITIVE Sensitive     CEFTRIAXONE <=1 SENSITIVE Sensitive     CIPROFLOXACIN <=0.25 SENSITIVE Sensitive  GENTAMICIN <=1 SENSITIVE Sensitive     IMIPENEM <=0.25 SENSITIVE Sensitive     NITROFURANTOIN 64 INTERMEDIATE Intermediate     TRIMETH/SULFA <=20 SENSITIVE Sensitive     AMPICILLIN/SULBACTAM >=32 RESISTANT Resistant     PIP/TAZO 32 INTERMEDIATE Intermediate     Extended ESBL NEGATIVE Sensitive     * >=100,000 COLONIES/mL KLEBSIELLA PNEUMONIAE  MRSA PCR Screening     Status: None   Collection Time: 07/26/17  4:36 PM  Result Value Ref Range Status   MRSA by PCR NEGATIVE NEGATIVE Final    Comment:         The GeneXpert MRSA Assay (FDA approved for NASAL specimens only), is one component of a comprehensive MRSA colonization surveillance program. It is not intended to diagnose MRSA infection nor to guide or monitor treatment for MRSA infections.      Time coordinating discharge: Over 30 minutes  SIGNED:   Debbe Odea, MD  [facility] 07/29/2017, 1:39 PM Pager   If 7PM-7AM, please contact night-coverage [email or web address] Password [email or web address]

## 2017-07-29 NOTE — Progress Notes (Signed)
Received verbal order from Dr. Wynelle Cleveland to remove PICC line prior to patient's discharge.

## 2017-07-29 NOTE — Care Management Important Message (Signed)
Important Message  Patient Details  Name: Norma [name] MRN: [id number] Date of Birth: 11/05/1933   Medicare Important Message Given:  Yes    Sherald Barge, RN 07/29/2017, 11:14 AM

## 2017-07-29 NOTE — Care Management Note (Addendum)
Case Management Note  Patient Details  Name: Norma [name] MRN: [id number] Date of Birth: 1934-02-08  Subjective/Objective:          Admitted with sepsis. Pt is from home. Lives with husband (who has TBI), daughter (her primary caregiver) and grandchild. Pt was DC'd from [facility] 1 week prior to coming into the hospital. Daughter reports pt had been doing her exercising after [place] stopped coming. Pt was walking with RW, has a WC, BSC and shower stool. PT has recommended SNF. Pt's daughter at bedside wants her mother to come home, feels comfortable she can care for her. She would like [facility] to come back for [facility].        Action/Plan: Potential DC home later today. [employer] rep, aware of referral and will pull pt info from chart. Cory aware pt will need labs in 2 days. Daughter aware HH has 48 hrs to make first visit. Per pt daughter will transport home in car. Per [facility] CM pt will have transition of care services by pt's PCP office.   Expected Discharge Date:     07/29/2017             Expected Discharge Plan:  [place]  In-House Referral:  NA  Discharge planning Services  CM Consult  Post Acute Care Choice:  Home Health Choice offered to:  Patient  HH Arranged:  RN, PT [facility] Agency:  [place]  Status of Service:  Completed, signed off  Sherald Barge, RN 07/29/2017, 10:31 AM

## 2017-07-31 ENCOUNTER — Other Ambulatory Visit: Payer: Self-pay | Admitting: *Deleted

## 2017-07-31 LAB — CULTURE, BLOOD (ROUTINE X 2)
Culture: NO GROWTH
SPECIAL REQUESTS: ADEQUATE
Special Requests: ADEQUATE

## 2017-07-31 NOTE — Patient Outreach (Signed)
Pt hospitalized 11/11-11/14/18 for urinary tract infection, referral received from primary MD office for uncontrolled diabetes, telephone call to pt for transition of care week 1, spoke with daughter [name] who is also CAP aide, HIPAA verified, daughter transports pt to appointments, states "we're not drinking the sugary drinks now"  Expects to pickup novolog tomorrow from [place],  Pt unable to weigh-  Too unsteady.  [name] verbalizes understanding that pt is to have bloodwork, RN CM reviewed information on discharge summary, reviewed medications.  CBG 225 and 121 last night.   [name]     Most Recent Value  Care Plan Problem One  Knowledge deficit related to diabetes  Role Documenting the Problem One  Care Management Coordinator  Care Plan for Problem One  Active  [name]   pt will have reduction in Hgb AIC by 1-2 points within 90 days  [name] Start Date  07/31/17  Interventions for Problem One Long Term Goal  RN CM discussed diet with daughter [name] and importance of pt avoiding concentrated sweets and sugary soft drinks  [name]   pt will verbalize foods high in carbohydrates to limit and will choose diet drinks, water instead of soft drinks with sugar within 30 days.  [name] Start Date  07/31/17  Interventions for Short Term Goal #1  daughter states pt is drinking more diet drinks now, RN CM reviewed plate method    [name]     Most Recent Value  Care Plan Problem Two  Knowledge deficit related to urinary tract infection  Role Documenting the Problem Two  Care Management Coordinator  Care Plan for Problem Two  Active  [name]   Pt will have no urinary tract infections within 30 days  [name] Start Date  07/31/17  Interventions for Short Term Goal #2   RN CM reviewed importance of drinking adequate water, taking the cranberry tablets and antibiotic  prescribed as well as estrace cream.      PLAN Continue weekly transition of care calls Ask about home visit- daughter declined today due to pt having home health  Jacqlyn Larsen [facility], [place] Coordinator [phone]

## 2017-08-05 ENCOUNTER — Other Ambulatory Visit: Payer: Self-pay | Admitting: *Deleted

## 2017-08-05 NOTE — Patient Outreach (Signed)
Telephone call to pt for transition of care week 2, spoke with patient's daughter Norma [name], HIPAA verified, Norma [name] reports pt will be seeing Dr. Quillian Quince today, CBG today 203 and last night 162, pt has had 2 low readings of 65 and 42 and per Norma [name], home health reported to MD and Norma [name] is to discuss this with MD today, per Norma [name], pt is eating 3 meals daily and snacks, Rn CM talked with Norma [name] about action plan for hypoglycemia. Pt still not able to weigh daily, pt is taking medications as prescribed per Norma [name].  [name]     Most Recent Value  Care Plan Problem One  Knowledge deficit related to diabetes  Role Documenting the Problem One  Care Management Coordinator  Care Plan for Problem One  Active  [name]   pt will have reduction in Hgb AIC by 1-2 points within 90 days  [name] Start Date  07/31/17  Interventions for Problem One Long Term Goal  RN CM reinforced diet with daughter Norma [name] and importance of pt avoiding concentrated sweets and sugary soft drinks  [name]   pt will verbalize foods high in carbohydrates to limit and will choose diet drinks, water instead of soft drinks with sugar within 30 days.  [name] Start Date  07/31/17  Interventions for Short Term Goal #1  per daughter pt is continuing to try and limit sugary soft drinks and concentrated sweets    [name]     Most Recent Value  Care Plan Problem Two  Knowledge deficit related to urinary tract infection  Role Documenting the Problem Two  Care Management Coordinator  Care Plan for Problem Two  Active  [name]   Pt will have no urinary tract infections within 30 days  [name] Start Date  07/31/17  Interventions for Short Term Goal #2   RN CM reinforced importance of drinking adequate water, taking the cranberry tablets and antibiotic prescribed as well as estrace cream., pt to see primary care MD today  and per daughter may have urinalysis completed, pt has no signs/ symptoms UTI at present     PLAN Continue weekly transition of care calls See pt for home visit week of 08/17/17  Norma [name] [facility], [place] Coordinator [phone]

## 2017-08-12 ENCOUNTER — Other Ambulatory Visit: Payer: Self-pay | Admitting: *Deleted

## 2017-08-12 NOTE — Patient Outreach (Signed)
Telephone call to pt for transition of care week 3, no answer to telephone and no option to leave voicemail.  PLAN See pt for home visit next week  Jacqlyn Larsen [facility], [place] Coordinator [phone]

## 2017-08-17 ENCOUNTER — Encounter: Payer: Self-pay | Admitting: *Deleted

## 2017-08-17 ENCOUNTER — Ambulatory Visit: Payer: Medicare PPO | Admitting: *Deleted

## 2017-08-17 ENCOUNTER — Other Ambulatory Visit: Payer: Self-pay | Admitting: *Deleted

## 2017-08-17 NOTE — Patient Outreach (Signed)
Norma [name]) Care Management   08/17/2017  Norma [name] 06/25/1934 [id number]  Norma [name] is an 83 y.o. female  Subjective: Initial home visit with pt, daughter present ([name] is CAP aide and lives with pt).  Urinary tract infection "cleared up"  Pt not drinking but "one sugary coke per day"  "trying to eat better"  Daughter states "left great toe has a little redness around the toenail like it's irritated and I'm watching it"  Objective:   Vitals:   08/17/17 1358  BP: 122/64  Pulse: 86  Resp: 18  SpO2: 96%  CBG 99 today fasting 224 last hs CBG 100-200's range per daughter ROS  Physical Exam  Constitutional: She is oriented to person, place, and time. She appears well-developed and well-nourished.  HENT:  Head: Normocephalic.  Neck: Normal range of motion. Neck supple.  Cardiovascular: Normal rate.  Respiratory: Effort normal and breath sounds normal.  GI: Soft. Bowel sounds are normal.  Musculoskeletal: Normal range of motion. She exhibits no edema.  Neurological: She is alert and oriented to person, place, and time.  Skin: Skin is warm and dry.  Right great toe has slight redness around toenail, no open areas noted, no drainage.  Psychiatric: She has a normal mood and affect. Her behavior is normal. Thought content normal.    Encounter Medications:   Outpatient Encounter Medications as of 08/17/2017  Medication Sig  . acetaminophen (TYLENOL) 325 MG tablet Take 2 tablets (650 mg total) every 6 (six) hours as needed by mouth for mild pain (or Fever >/= 101).  . carvedilol (COREG) 6.25 MG tablet Take 1 tablet (6.25 mg total) 2 (two) times daily with a meal by mouth.  . clopidogrel (PLAVIX) 75 MG tablet Take 75 mg by mouth daily.  Marland Kitchen conjugated estrogens (PREMARIN) vaginal cream Place 3.55 Applicatorfuls every other day vaginally.  . Cranberry 300 MG tablet Take 1 tablet (300 mg total) every other day by mouth.  . DULoxetine (CYMBALTA) 60 MG capsule  Take 60 mg by mouth 2 (two) times daily.   . furosemide (LASIX) 40 MG tablet Take 40 mg by mouth daily as needed for fluid.   Marland Kitchen insulin detemir (LEVEMIR) 100 UNIT/ML injection Inject 0.25 mLs (25 Units total) at bedtime into the skin. (Patient taking differently: Inject 20 Units into the skin at bedtime. )  . oxyCODONE-acetaminophen (PERCOCET) 10-325 MG tablet Take 1 tablet 4 (four) times daily by mouth.  . pantoprazole (PROTONIX) 40 MG tablet Take 1 tablet by mouth at bedtime.   . saccharomyces boulardii (FLORASTOR) 250 MG capsule Take 1 capsule (250 mg total) 2 (two) times daily by mouth.  . simvastatin (ZOCOR) 20 MG tablet Take 20 mg by mouth every evening.   . ciprofloxacin (CIPRO) 500 MG tablet Take 1 tablet (500 mg total) daily by mouth. (Patient not taking: Reported on 08/17/2017)  . insulin aspart (NOVOLOG) 100 UNIT/ML injection Inject 4 Units 3 (three) times daily with meals into the skin. (Patient not taking: Reported on 07/31/2017)  . potassium chloride (K-DUR) 10 MEQ tablet Take 1 tablet by mouth daily.   . promethazine (PHENERGAN) 25 MG tablet Take 1 tablet by mouth every 4 (four) hours as needed.   No facility-administered encounter medications on file as of 08/17/2017.     Functional Status:   In your present state of health, do you have any difficulty performing the following activities: 08/17/2017 07/26/2017  Hearing? N N  Vision? N N  Difficulty concentrating or making  decisions? Tempie Donning  Walking or climbing stairs? Y Y  Dressing or bathing? Y Y  Doing errands, shopping? Y N  Preparing Food and eating ? Y -  Using the Toilet? Y -  In the past six months, have you accidently leaked urine? Y -  Comment - -  Do you have problems with loss of bowel control? N -  Managing your Medications? Y -  Managing your Finances? Y -  Housekeeping or managing your Housekeeping? Y -  Some recent data might be hidden    Fall/Depression Screening:    Fall Risk  08/17/2017 04/07/2017 02/02/2017   Falls in the past year? No Yes Yes  Number falls in past yr: - 2 or more 1  Injury with Fall? - No No  Risk Factor Category  - High Fall Risk High Fall Risk  Risk for fall due to : Medication side effect;Impaired balance/gait History of fall(s);Medication side effect Impaired mobility;History of fall(s)  Follow up - Education provided;Falls evaluation completed;Falls prevention discussed Falls prevention discussed   PHQ 2/9 Scores 08/17/2017 02/02/2017 01/23/2017  PHQ - 2 Score 6 6 5   PHQ- 9 Score 9 9 18     Assessment:  RN CM reviewed medications with pt and daughter.  Pt has depression and is on medication but not willing to counseling.  RN CM ask daughter to make appointment with podiatrist as pt toenails need trimming.  RN CM emphasized plate method and being aware of foods high in carbohydrates to limit/ avoid.  RN CM faxed barrier letter and initial home visit to primary MD Dr. Quillian Quince, reported pt has slight redness around toenail of right great toe, reported PHQ-9 score is 9 and pt not willing to complete counseling or seek further services for this.  [name]     Most Recent Value  Care Plan Problem One  Knowledge deficit related to diabetes  Role Documenting the Problem One  Care Management Coordinator  Care Plan for Problem One  Active  [name]   pt will have reduction in Hgb AIC by 1-2 points within 90 days  [name] Start Date  07/31/17  Interventions for Problem One Long Term Goal  RN CM reviewed  diet with daughter [name] and importance of pt avoiding concentrated sweets and sugary soft drinks  [name]   pt will verbalize foods high in carbohydrates to limit and will choose diet drinks, water instead of soft drinks with sugar within 30 days.  [name] Start Date  07/31/17  Interventions for Short Term Goal #1  RN CM reviewed with pt and verified pt is drinking one sugary cola daily, drinking diet drinks and  refuses water.    [name] CM Care Plan Problem Two     Most Recent Value  Care Plan Problem Two  Knowledge deficit related to urinary tract infection  Role Documenting the Problem Two  Care Management Coordinator  Care Plan for Problem Two  Active  [name]   Pt will have no urinary tract infections within 30 days  [name] Start Date  07/31/17  Interventions for Short Term Goal #2   RN CM reiterated importance of drinking adequate water, taking the cranberry tablets andas well as estrace cream,  pt has no signs/ symptoms UTI at present    [facility] CM Care Plan Problem Three  Most Recent Value  Care Plan Problem Three  Pt high risk for problems with her feet  Role Documenting the Problem Three  Care Management Coordinator  [name]   Patient's daughter will make appointment for pt to see podiatrist within 30 days  [name] Start Date  08/17/17  Interventions for Short Term Goal #1  Rn CM observed patient's feet, toenails are thick and need trimming, RN CM ask daughter to make appointment to have feet assessed and toenails trimmed, reviewed importance of observing feet each day and reporting changes to MD      Plan: follow up with home visit next month  Jacqlyn Larsen [facility], [place] [phone]

## 2017-09-17 ENCOUNTER — Other Ambulatory Visit: Payer: Self-pay | Admitting: *Deleted

## 2017-09-17 NOTE — Patient Outreach (Signed)
RN CM drove to patient's home for scheduled home visit, no answer to door, no answer to telephone after several attempts.  PLAN Attempt to reach pt tomorrow  Jacqlyn Larsen [facility], [place] Coordinator [phone]

## 2017-09-18 ENCOUNTER — Other Ambulatory Visit: Payer: Self-pay | Admitting: *Deleted

## 2017-09-18 NOTE — Patient Outreach (Signed)
RN CM called pt to reschedule home visit, spoke with patient's daughter Norma [name], HIPAA verified, Norma [name] states "everything's ok, there's really no need for you to come back out here"  RN CM discussed recent CBG readings with Norma [name] 80-90's in morning and 230-250's in evening, pt has had no urinary tract infections since November, Norma [name] reports she made appointment for pt to see podiatrist and has appointment for 10/09/17, reports left great toe has no more redness.  RN CM talked with Norma [name] about discharge plan and Norma [name] is agreeable to telephonic assessment in February.  RN CM reviewed action plan with Norma [name] for diabetes, urinary tract issues.  [name] CM Care Plan Problem One     Most Recent Value  Care Plan Problem One  Knowledge deficit related to diabetes  Role Documenting the Problem One  Care Management Coordinator  Care Plan for Problem One  Active  [name]   pt will have reduction in Hgb AIC by 1-2 points within 90 days  [name] Start Date  07/31/17  Interventions for Problem One Long Term Goal  Rn CM reinforced diet, food choices, limiting foods high in carbohydrates, concentrated sweets, sodas  [name]   pt will verbalize foods high in carbohydrates to limit and will choose diet drinks, water instead of soft drinks with sugar within 30 days.  [name] Start Date  07/31/17  [name] Met Date  09/18/17  Interventions for Short Term Goal #1  Pt is doing better with drinking water and limiting sodas,  Rn CM praised and encouraged for pt and daughter's efforts    [name] CM Care Plan Problem Two     Most Recent Value  Care Plan Problem Two  Knowledge deficit related to urinary tract infection  Role Documenting the Problem Two  Care Management Coordinator  Care Plan for Problem Two  Active  [name]   Pt will have no urinary tract infections within 30 days  [name] Start Date  09/18/17 Norma [name]  restarted   ongoing]  [name] Met Date   -- [ongoing]  Interventions for Short Term Goal #2   RN CM reinforced importance of drinking adequate water, taking the cranberry tablets andas well as estrace cream,  pt has no signs/ symptoms UTI at present and has had no urinary tract infection since November    [name]     Most Recent Value  Care Plan Problem Three  Pt high risk for developing complications with her feet  Role Documenting the Problem Three  Care Management Coordinator  [name] CM Short Term Goal #1   Patient's daughter will make appointment for pt to see podiatrist within 30 days  [name] Start Date  08/17/17  [name] CM Short Term Goal #1 Met Date  09/18/17  Interventions for Short Term Goal #1  Patient's daughter did schedule appointment with podiatrist for 10/09/17       PLAN Call pt for telephonic assessment in February  Norma [name] [facility], [name] Coordinator [phone]

## 2017-10-19 ENCOUNTER — Other Ambulatory Visit: Payer: Self-pay | Admitting: *Deleted

## 2017-10-19 NOTE — Patient Outreach (Signed)
Telephone call to patient for telephonic assessment, spoke with patient and permission given to speak with daughter Paula, spoke with Paula, HIPAA verified, Paula reports she and her 2 children are staying with Ms.Norma [name] due to daughter [name] (primary caregiver) had MI and not sure when [name] will be able to care for Ms. Heimann but does have plans to resume caring for her mother.  Paula reports patient's fasting CBG today 99 and readings " pretty good in morning but still elevated in the evening 200's range but on occasion reading 300-400"  Paula states pt is to see primary care MD tomorrow and will discuss readings with MD.  Pt has still had no urinary tract infections and continues "preventive measures" per daughter.  RN CM discussed discharge plan and Paula prefers RN CM call back beginning of March when hopefully [name] will be back by then, and feels it has been stressful for pt due to change in caregivers "and everything is out of sorts right now"  [name]     Most Recent Value  Care Plan Problem One  Knowledge deficit related to diabetes  Role Documenting the Problem One  Care Management Coordinator  Care Plan for Problem One  Active  [name]   pt will have reduction in Hgb AIC by 1-2 points within 90 days  [name] Start Date  07/31/17  Interventions for Problem One Long Term Goal  RN CM reveiewed diet with daughter Paula, food choices, limiting foods high in carbohydrates, concentrated sweets, sodas, Paula states pt is hardly ever drinking sugary beverages  [name]   pt will verbalize foods high in carbohydrates to limit and will choose diet drinks, water instead of soft drinks with sugar within 30 days.  [name] Start Date  07/31/17  [name] Met Date  09/18/17    [name]     Most Recent Value  Care Plan Problem Two  Knowledge deficit related to urinary tract infection  Role  Documenting the Problem Two  Care Management Coordinator  Care Plan for Problem Two  Active  [name]   Pt will have no urinary tract infections within 30 days  [name] Start Date  10/19/17 [goal re-established- ongoing]  [name] Met Date   -- [ongoing]  Interventions for Short Term Goal #2   RN CM rviewed signs/ symtpoms urinary tract infection with daughter Paula who also reports pt continues taking cranberry tablets.    [name]     Most Recent Value  Care Plan Problem Three  Pt high risk for developing complications with her feet  Role Documenting the Problem Three  Care Management Coordinator  [name]   Pt will reschedule appointment to see podiatrist within 30 days  [name] Start Date  10/19/17 [go re-established]  Interventions for Short Term Goal #1  Pt had to cancel 10/09/17 podiatrist appointment due to her daughter [name] had health issue, patient's other daughter Paula will reschedule      PLAN Outreach pt in March for telephonic assessment Assess CBG readings Follow up MD appointment with primary care on 10/20/17    RNC, BSN [facility] Community Care Coordinator [phone]   

## 2017-11-16 ENCOUNTER — Encounter: Payer: Self-pay | Admitting: *Deleted

## 2017-11-16 ENCOUNTER — Other Ambulatory Visit: Payer: Self-pay | Admitting: *Deleted

## 2017-11-16 NOTE — Patient Outreach (Signed)
Telephone call to pt for telephone assessment, spoke with patient's husband, HIPAA verified, reports pt doing well, has [employer] "up to 7-8 hours day", pt daughter no longer stays with her due to illness.  Pt has had no urinary tract infections and is taking cranberry pills, using premarin cream, reports pt has all medications and taking as prescribed.  CBG today 118, "still a little high at night but much better" some readings in 200's.  Patient's daughter Nevin Bloodgood checks in on pt and spouse and assists with cooking, errands, grandson also assists.  Patient's spouse feels they have what they need at present, caregivers assisting with what they need.  RN CM discussed discharge plan and patient's spouse does not feel pt has any further needs for care management, pt agrees and no further goals she wants to work towards.  RN CM faxed case closure letter to primary MD and mailed case closure letter to patient's home.  [name]     Most Recent Value  Care Plan Problem One  Knowledge deficit related to diabetes  Role Documenting the Problem One  Care Management Coordinator  Care Plan for Problem One  Active  [name]   pt will have reduction in Hgb AIC by 1-2 points within 90 days  [name] Start Date  07/31/17  [facility] Long Term Goal Met Date  11/16/17  Interventions for Problem One Long Term Goal  RN CM reinforced food choices, limiting foods high in carbohydrates, concentrated sweets, sodas  [name]   pt will verbalize foods high in carbohydrates to limit and will choose diet drinks, water instead of soft drinks with sugar within 30 days.  [name] Start Date  07/31/17  [name] Met Date  09/18/17    [facility] CM Care Plan Problem Two     Most Recent Value  Care Plan Problem Two  Knowledge deficit related to urinary tract infection  Role Documenting the Problem Two  Care Management Coordinator  Care Plan for Problem Two   Active  [name]   Pt will have no urinary tract infections within 30 days  [name] Start Date  10/19/17 [goal re-established- ongoing]  [name] Met Date   11/16/17  Interventions for Short Term Goal #2   RN CM reinforced signs/ symtpoms urinary tract infection , pt continues taking cranberry tablets.    [name]     Most Recent Value  Care Plan Problem Three  Pt high risk for developing complications with her feet  Role Documenting the Problem Three  Care Management Coordinator  [name]   Pt will reschedule appointment to see podiatrist within 30 days  [name] Start Date  10/19/17 [go re-established]  [name] Met Date  11/16/17  Interventions for Short Term Goal #1  RN CM reviewed importance of continuing attending podiatry appointments       PLAN Discharge today  Jacqlyn Larsen [facility], BSN [place] Coordinator [phone]

## 2018-01-13 DIAGNOSIS — Z6829 Body mass index (BMI) 29.0-29.9, adult: Secondary | ICD-10-CM | POA: Diagnosis not present

## 2018-01-13 DIAGNOSIS — E1165 Type 2 diabetes mellitus with hyperglycemia: Secondary | ICD-10-CM | POA: Diagnosis not present

## 2018-01-13 DIAGNOSIS — E785 Hyperlipidemia, unspecified: Secondary | ICD-10-CM | POA: Diagnosis not present

## 2018-01-13 DIAGNOSIS — F330 Major depressive disorder, recurrent, mild: Secondary | ICD-10-CM | POA: Diagnosis not present

## 2018-01-13 DIAGNOSIS — J301 Allergic rhinitis due to pollen: Secondary | ICD-10-CM | POA: Diagnosis not present

## 2018-01-13 DIAGNOSIS — E663 Overweight: Secondary | ICD-10-CM | POA: Diagnosis not present

## 2018-01-13 DIAGNOSIS — I10 Essential (primary) hypertension: Secondary | ICD-10-CM | POA: Diagnosis not present

## 2018-01-13 DIAGNOSIS — M545 Low back pain: Secondary | ICD-10-CM | POA: Diagnosis not present

## 2018-01-13 DIAGNOSIS — Z9049 Acquired absence of other specified parts of digestive tract: Secondary | ICD-10-CM | POA: Diagnosis not present

## 2018-01-13 DIAGNOSIS — Z96653 Presence of artificial knee joint, bilateral: Secondary | ICD-10-CM | POA: Diagnosis not present

## 2018-01-13 DIAGNOSIS — N952 Postmenopausal atrophic vaginitis: Secondary | ICD-10-CM | POA: Diagnosis not present

## 2018-01-13 DIAGNOSIS — Z9841 Cataract extraction status, right eye: Secondary | ICD-10-CM | POA: Diagnosis not present

## 2018-01-13 DIAGNOSIS — Z90710 Acquired absence of both cervix and uterus: Secondary | ICD-10-CM | POA: Diagnosis not present

## 2018-01-13 DIAGNOSIS — Z9842 Cataract extraction status, left eye: Secondary | ICD-10-CM | POA: Diagnosis not present

## 2018-02-15 DIAGNOSIS — K921 Melena: Secondary | ICD-10-CM | POA: Diagnosis not present

## 2018-02-15 DIAGNOSIS — I428 Other cardiomyopathies: Secondary | ICD-10-CM | POA: Diagnosis not present

## 2018-02-15 DIAGNOSIS — I130 Hypertensive heart and chronic kidney disease with heart failure and stage 1 through stage 4 chronic kidney disease, or unspecified chronic kidney disease: Secondary | ICD-10-CM | POA: Diagnosis not present

## 2018-02-15 DIAGNOSIS — B9620 Unspecified Escherichia coli [E. coli] as the cause of diseases classified elsewhere: Secondary | ICD-10-CM | POA: Diagnosis not present

## 2018-02-15 DIAGNOSIS — K5669 Other partial intestinal obstruction: Secondary | ICD-10-CM | POA: Diagnosis not present

## 2018-02-15 DIAGNOSIS — I5032 Chronic diastolic (congestive) heart failure: Secondary | ICD-10-CM | POA: Diagnosis not present

## 2018-02-15 DIAGNOSIS — N183 Chronic kidney disease, stage 3 (moderate): Secondary | ICD-10-CM | POA: Diagnosis not present

## 2018-02-15 DIAGNOSIS — N179 Acute kidney failure, unspecified: Secondary | ICD-10-CM | POA: Diagnosis not present

## 2018-02-15 DIAGNOSIS — R1110 Vomiting, unspecified: Secondary | ICD-10-CM | POA: Diagnosis not present

## 2018-02-15 DIAGNOSIS — R9431 Abnormal electrocardiogram [ECG] [EKG]: Secondary | ICD-10-CM | POA: Diagnosis not present

## 2018-02-15 DIAGNOSIS — K56600 Partial intestinal obstruction, unspecified as to cause: Secondary | ICD-10-CM | POA: Diagnosis not present

## 2018-02-15 DIAGNOSIS — K5651 Intestinal adhesions [bands], with partial obstruction: Secondary | ICD-10-CM | POA: Diagnosis not present

## 2018-02-15 DIAGNOSIS — F331 Major depressive disorder, recurrent, moderate: Secondary | ICD-10-CM | POA: Diagnosis not present

## 2018-02-15 DIAGNOSIS — R1111 Vomiting without nausea: Secondary | ICD-10-CM | POA: Diagnosis not present

## 2018-02-15 DIAGNOSIS — N390 Urinary tract infection, site not specified: Secondary | ICD-10-CM | POA: Diagnosis not present

## 2018-02-23 DIAGNOSIS — R5383 Other fatigue: Secondary | ICD-10-CM | POA: Diagnosis not present

## 2018-02-23 DIAGNOSIS — R42 Dizziness and giddiness: Secondary | ICD-10-CM | POA: Diagnosis not present

## 2018-02-23 DIAGNOSIS — E876 Hypokalemia: Secondary | ICD-10-CM | POA: Diagnosis not present

## 2018-02-23 DIAGNOSIS — R7881 Bacteremia: Secondary | ICD-10-CM | POA: Diagnosis not present

## 2018-02-23 DIAGNOSIS — K210 Gastro-esophageal reflux disease with esophagitis: Secondary | ICD-10-CM | POA: Diagnosis not present

## 2018-02-23 DIAGNOSIS — I10 Essential (primary) hypertension: Secondary | ICD-10-CM | POA: Diagnosis not present

## 2018-02-23 DIAGNOSIS — G301 Alzheimer's disease with late onset: Secondary | ICD-10-CM | POA: Diagnosis not present

## 2018-02-23 DIAGNOSIS — E782 Mixed hyperlipidemia: Secondary | ICD-10-CM | POA: Diagnosis not present

## 2018-02-23 DIAGNOSIS — E1122 Type 2 diabetes mellitus with diabetic chronic kidney disease: Secondary | ICD-10-CM | POA: Diagnosis not present

## 2018-02-24 DIAGNOSIS — K5651 Intestinal adhesions [bands], with partial obstruction: Secondary | ICD-10-CM | POA: Diagnosis not present

## 2018-02-24 DIAGNOSIS — M5136 Other intervertebral disc degeneration, lumbar region: Secondary | ICD-10-CM | POA: Diagnosis not present

## 2018-02-24 DIAGNOSIS — I130 Hypertensive heart and chronic kidney disease with heart failure and stage 1 through stage 4 chronic kidney disease, or unspecified chronic kidney disease: Secondary | ICD-10-CM | POA: Diagnosis not present

## 2018-02-24 DIAGNOSIS — I5032 Chronic diastolic (congestive) heart failure: Secondary | ICD-10-CM | POA: Diagnosis not present

## 2018-02-24 DIAGNOSIS — E1122 Type 2 diabetes mellitus with diabetic chronic kidney disease: Secondary | ICD-10-CM | POA: Diagnosis not present

## 2018-02-24 DIAGNOSIS — N183 Chronic kidney disease, stage 3 (moderate): Secondary | ICD-10-CM | POA: Diagnosis not present

## 2018-02-25 ENCOUNTER — Other Ambulatory Visit (HOSPITAL_COMMUNITY): Payer: Self-pay | Admitting: Family Medicine

## 2018-02-25 DIAGNOSIS — K5651 Intestinal adhesions [bands], with partial obstruction: Secondary | ICD-10-CM | POA: Diagnosis not present

## 2018-02-25 DIAGNOSIS — Z23 Encounter for immunization: Secondary | ICD-10-CM | POA: Diagnosis not present

## 2018-02-25 DIAGNOSIS — I130 Hypertensive heart and chronic kidney disease with heart failure and stage 1 through stage 4 chronic kidney disease, or unspecified chronic kidney disease: Secondary | ICD-10-CM | POA: Diagnosis not present

## 2018-02-25 DIAGNOSIS — G301 Alzheimer's disease with late onset: Secondary | ICD-10-CM | POA: Diagnosis not present

## 2018-02-25 DIAGNOSIS — Z9189 Other specified personal risk factors, not elsewhere classified: Secondary | ICD-10-CM | POA: Diagnosis not present

## 2018-02-25 DIAGNOSIS — J301 Allergic rhinitis due to pollen: Secondary | ICD-10-CM | POA: Diagnosis not present

## 2018-02-25 DIAGNOSIS — E782 Mixed hyperlipidemia: Secondary | ICD-10-CM | POA: Diagnosis not present

## 2018-02-25 DIAGNOSIS — I5032 Chronic diastolic (congestive) heart failure: Secondary | ICD-10-CM | POA: Diagnosis not present

## 2018-02-25 DIAGNOSIS — R7989 Other specified abnormal findings of blood chemistry: Secondary | ICD-10-CM

## 2018-02-25 DIAGNOSIS — Z0001 Encounter for general adult medical examination with abnormal findings: Secondary | ICD-10-CM | POA: Diagnosis not present

## 2018-02-25 DIAGNOSIS — E1122 Type 2 diabetes mellitus with diabetic chronic kidney disease: Secondary | ICD-10-CM | POA: Diagnosis not present

## 2018-02-25 DIAGNOSIS — F331 Major depressive disorder, recurrent, moderate: Secondary | ICD-10-CM | POA: Diagnosis not present

## 2018-02-25 DIAGNOSIS — M5136 Other intervertebral disc degeneration, lumbar region: Secondary | ICD-10-CM | POA: Diagnosis not present

## 2018-02-25 DIAGNOSIS — R945 Abnormal results of liver function studies: Principal | ICD-10-CM

## 2018-02-25 DIAGNOSIS — N183 Chronic kidney disease, stage 3 (moderate): Secondary | ICD-10-CM | POA: Diagnosis not present

## 2018-02-25 DIAGNOSIS — I10 Essential (primary) hypertension: Secondary | ICD-10-CM | POA: Diagnosis not present

## 2018-02-25 DIAGNOSIS — K219 Gastro-esophageal reflux disease without esophagitis: Secondary | ICD-10-CM | POA: Diagnosis not present

## 2018-02-25 DIAGNOSIS — I69351 Hemiplegia and hemiparesis following cerebral infarction affecting right dominant side: Secondary | ICD-10-CM | POA: Diagnosis not present

## 2018-03-01 ENCOUNTER — Ambulatory Visit (HOSPITAL_COMMUNITY)
Admission: RE | Admit: 2018-03-01 | Discharge: 2018-03-01 | Disposition: A | Discharge status: Home / Self Care | Payer: Medicare HMO | Source: Ambulatory Visit | Attending: Family Medicine | Admitting: Family Medicine

## 2018-03-01 DIAGNOSIS — R93422 Abnormal radiologic findings on diagnostic imaging of left kidney: Secondary | ICD-10-CM | POA: Insufficient documentation

## 2018-03-01 DIAGNOSIS — R7989 Other specified abnormal findings of blood chemistry: Secondary | ICD-10-CM | POA: Diagnosis not present

## 2018-03-01 DIAGNOSIS — R93421 Abnormal radiologic findings on diagnostic imaging of right kidney: Secondary | ICD-10-CM | POA: Diagnosis not present

## 2018-03-01 DIAGNOSIS — N2889 Other specified disorders of kidney and ureter: Secondary | ICD-10-CM | POA: Diagnosis not present

## 2018-03-01 DIAGNOSIS — R945 Abnormal results of liver function studies: Secondary | ICD-10-CM | POA: Diagnosis not present

## 2018-03-02 DIAGNOSIS — M5136 Other intervertebral disc degeneration, lumbar region: Secondary | ICD-10-CM | POA: Diagnosis not present

## 2018-03-02 DIAGNOSIS — N183 Chronic kidney disease, stage 3 (moderate): Secondary | ICD-10-CM | POA: Diagnosis not present

## 2018-03-02 DIAGNOSIS — I130 Hypertensive heart and chronic kidney disease with heart failure and stage 1 through stage 4 chronic kidney disease, or unspecified chronic kidney disease: Secondary | ICD-10-CM | POA: Diagnosis not present

## 2018-03-02 DIAGNOSIS — K5651 Intestinal adhesions [bands], with partial obstruction: Secondary | ICD-10-CM | POA: Diagnosis not present

## 2018-03-02 DIAGNOSIS — E1122 Type 2 diabetes mellitus with diabetic chronic kidney disease: Secondary | ICD-10-CM | POA: Diagnosis not present

## 2018-03-02 DIAGNOSIS — I5032 Chronic diastolic (congestive) heart failure: Secondary | ICD-10-CM | POA: Diagnosis not present

## 2018-03-04 DIAGNOSIS — I130 Hypertensive heart and chronic kidney disease with heart failure and stage 1 through stage 4 chronic kidney disease, or unspecified chronic kidney disease: Secondary | ICD-10-CM | POA: Diagnosis not present

## 2018-03-04 DIAGNOSIS — K5651 Intestinal adhesions [bands], with partial obstruction: Secondary | ICD-10-CM | POA: Diagnosis not present

## 2018-03-04 DIAGNOSIS — E1122 Type 2 diabetes mellitus with diabetic chronic kidney disease: Secondary | ICD-10-CM | POA: Diagnosis not present

## 2018-03-04 DIAGNOSIS — M5136 Other intervertebral disc degeneration, lumbar region: Secondary | ICD-10-CM | POA: Diagnosis not present

## 2018-03-04 DIAGNOSIS — N183 Chronic kidney disease, stage 3 (moderate): Secondary | ICD-10-CM | POA: Diagnosis not present

## 2018-03-04 DIAGNOSIS — I5032 Chronic diastolic (congestive) heart failure: Secondary | ICD-10-CM | POA: Diagnosis not present

## 2018-03-08 DIAGNOSIS — E1122 Type 2 diabetes mellitus with diabetic chronic kidney disease: Secondary | ICD-10-CM | POA: Diagnosis not present

## 2018-03-08 DIAGNOSIS — K5651 Intestinal adhesions [bands], with partial obstruction: Secondary | ICD-10-CM | POA: Diagnosis not present

## 2018-03-08 DIAGNOSIS — N183 Chronic kidney disease, stage 3 (moderate): Secondary | ICD-10-CM | POA: Diagnosis not present

## 2018-03-08 DIAGNOSIS — M5136 Other intervertebral disc degeneration, lumbar region: Secondary | ICD-10-CM | POA: Diagnosis not present

## 2018-03-08 DIAGNOSIS — I5032 Chronic diastolic (congestive) heart failure: Secondary | ICD-10-CM | POA: Diagnosis not present

## 2018-03-08 DIAGNOSIS — I130 Hypertensive heart and chronic kidney disease with heart failure and stage 1 through stage 4 chronic kidney disease, or unspecified chronic kidney disease: Secondary | ICD-10-CM | POA: Diagnosis not present

## 2018-03-09 DIAGNOSIS — E1122 Type 2 diabetes mellitus with diabetic chronic kidney disease: Secondary | ICD-10-CM | POA: Diagnosis not present

## 2018-03-09 DIAGNOSIS — N183 Chronic kidney disease, stage 3 (moderate): Secondary | ICD-10-CM | POA: Diagnosis not present

## 2018-03-09 DIAGNOSIS — K5651 Intestinal adhesions [bands], with partial obstruction: Secondary | ICD-10-CM | POA: Diagnosis not present

## 2018-03-09 DIAGNOSIS — I130 Hypertensive heart and chronic kidney disease with heart failure and stage 1 through stage 4 chronic kidney disease, or unspecified chronic kidney disease: Secondary | ICD-10-CM | POA: Diagnosis not present

## 2018-03-09 DIAGNOSIS — M5136 Other intervertebral disc degeneration, lumbar region: Secondary | ICD-10-CM | POA: Diagnosis not present

## 2018-03-09 DIAGNOSIS — I5032 Chronic diastolic (congestive) heart failure: Secondary | ICD-10-CM | POA: Diagnosis not present

## 2018-03-11 DIAGNOSIS — M5136 Other intervertebral disc degeneration, lumbar region: Secondary | ICD-10-CM | POA: Diagnosis not present

## 2018-03-11 DIAGNOSIS — N183 Chronic kidney disease, stage 3 (moderate): Secondary | ICD-10-CM | POA: Diagnosis not present

## 2018-03-11 DIAGNOSIS — E1122 Type 2 diabetes mellitus with diabetic chronic kidney disease: Secondary | ICD-10-CM | POA: Diagnosis not present

## 2018-03-11 DIAGNOSIS — I130 Hypertensive heart and chronic kidney disease with heart failure and stage 1 through stage 4 chronic kidney disease, or unspecified chronic kidney disease: Secondary | ICD-10-CM | POA: Diagnosis not present

## 2018-03-11 DIAGNOSIS — K5651 Intestinal adhesions [bands], with partial obstruction: Secondary | ICD-10-CM | POA: Diagnosis not present

## 2018-03-11 DIAGNOSIS — I5032 Chronic diastolic (congestive) heart failure: Secondary | ICD-10-CM | POA: Diagnosis not present

## 2018-03-15 DIAGNOSIS — R945 Abnormal results of liver function studies: Secondary | ICD-10-CM | POA: Diagnosis not present

## 2018-03-16 DIAGNOSIS — N183 Chronic kidney disease, stage 3 (moderate): Secondary | ICD-10-CM | POA: Diagnosis not present

## 2018-03-16 DIAGNOSIS — E1122 Type 2 diabetes mellitus with diabetic chronic kidney disease: Secondary | ICD-10-CM | POA: Diagnosis not present

## 2018-03-16 DIAGNOSIS — I130 Hypertensive heart and chronic kidney disease with heart failure and stage 1 through stage 4 chronic kidney disease, or unspecified chronic kidney disease: Secondary | ICD-10-CM | POA: Diagnosis not present

## 2018-03-16 DIAGNOSIS — I5032 Chronic diastolic (congestive) heart failure: Secondary | ICD-10-CM | POA: Diagnosis not present

## 2018-03-16 DIAGNOSIS — M5136 Other intervertebral disc degeneration, lumbar region: Secondary | ICD-10-CM | POA: Diagnosis not present

## 2018-03-16 DIAGNOSIS — K5651 Intestinal adhesions [bands], with partial obstruction: Secondary | ICD-10-CM | POA: Diagnosis not present

## 2018-03-19 DIAGNOSIS — N183 Chronic kidney disease, stage 3 (moderate): Secondary | ICD-10-CM | POA: Diagnosis not present

## 2018-03-19 DIAGNOSIS — M5136 Other intervertebral disc degeneration, lumbar region: Secondary | ICD-10-CM | POA: Diagnosis not present

## 2018-03-19 DIAGNOSIS — I5032 Chronic diastolic (congestive) heart failure: Secondary | ICD-10-CM | POA: Diagnosis not present

## 2018-03-19 DIAGNOSIS — E1122 Type 2 diabetes mellitus with diabetic chronic kidney disease: Secondary | ICD-10-CM | POA: Diagnosis not present

## 2018-03-19 DIAGNOSIS — I130 Hypertensive heart and chronic kidney disease with heart failure and stage 1 through stage 4 chronic kidney disease, or unspecified chronic kidney disease: Secondary | ICD-10-CM | POA: Diagnosis not present

## 2018-03-19 DIAGNOSIS — K5651 Intestinal adhesions [bands], with partial obstruction: Secondary | ICD-10-CM | POA: Diagnosis not present

## 2018-03-22 DIAGNOSIS — E782 Mixed hyperlipidemia: Secondary | ICD-10-CM | POA: Diagnosis not present

## 2018-03-22 DIAGNOSIS — N184 Chronic kidney disease, stage 4 (severe): Secondary | ICD-10-CM | POA: Diagnosis not present

## 2018-03-22 DIAGNOSIS — I69351 Hemiplegia and hemiparesis following cerebral infarction affecting right dominant side: Secondary | ICD-10-CM | POA: Diagnosis not present

## 2018-03-22 DIAGNOSIS — R1011 Right upper quadrant pain: Secondary | ICD-10-CM | POA: Diagnosis not present

## 2018-03-22 DIAGNOSIS — E1122 Type 2 diabetes mellitus with diabetic chronic kidney disease: Secondary | ICD-10-CM | POA: Diagnosis not present

## 2018-03-22 DIAGNOSIS — I10 Essential (primary) hypertension: Secondary | ICD-10-CM | POA: Diagnosis not present

## 2018-03-22 DIAGNOSIS — I700 Atherosclerosis of aorta: Secondary | ICD-10-CM | POA: Diagnosis not present

## 2018-03-22 DIAGNOSIS — D649 Anemia, unspecified: Secondary | ICD-10-CM | POA: Diagnosis not present

## 2018-03-23 DIAGNOSIS — K5651 Intestinal adhesions [bands], with partial obstruction: Secondary | ICD-10-CM | POA: Diagnosis not present

## 2018-03-23 DIAGNOSIS — E1122 Type 2 diabetes mellitus with diabetic chronic kidney disease: Secondary | ICD-10-CM | POA: Diagnosis not present

## 2018-03-23 DIAGNOSIS — I5032 Chronic diastolic (congestive) heart failure: Secondary | ICD-10-CM | POA: Diagnosis not present

## 2018-03-23 DIAGNOSIS — M5136 Other intervertebral disc degeneration, lumbar region: Secondary | ICD-10-CM | POA: Diagnosis not present

## 2018-03-23 DIAGNOSIS — I130 Hypertensive heart and chronic kidney disease with heart failure and stage 1 through stage 4 chronic kidney disease, or unspecified chronic kidney disease: Secondary | ICD-10-CM | POA: Diagnosis not present

## 2018-03-23 DIAGNOSIS — N183 Chronic kidney disease, stage 3 (moderate): Secondary | ICD-10-CM | POA: Diagnosis not present

## 2018-04-27 DIAGNOSIS — M79676 Pain in unspecified toe(s): Secondary | ICD-10-CM | POA: Diagnosis not present

## 2018-04-27 DIAGNOSIS — B351 Tinea unguium: Secondary | ICD-10-CM | POA: Diagnosis not present

## 2018-04-27 DIAGNOSIS — E1142 Type 2 diabetes mellitus with diabetic polyneuropathy: Secondary | ICD-10-CM | POA: Diagnosis not present

## 2018-04-27 DIAGNOSIS — L84 Corns and callosities: Secondary | ICD-10-CM | POA: Diagnosis not present

## 2018-05-04 DIAGNOSIS — Z794 Long term (current) use of insulin: Secondary | ICD-10-CM | POA: Diagnosis not present

## 2018-05-04 DIAGNOSIS — F332 Major depressive disorder, recurrent severe without psychotic features: Secondary | ICD-10-CM | POA: Diagnosis not present

## 2018-05-04 DIAGNOSIS — I69351 Hemiplegia and hemiparesis following cerebral infarction affecting right dominant side: Secondary | ICD-10-CM | POA: Diagnosis not present

## 2018-05-04 DIAGNOSIS — E1165 Type 2 diabetes mellitus with hyperglycemia: Secondary | ICD-10-CM | POA: Diagnosis not present

## 2018-05-04 DIAGNOSIS — F1120 Opioid dependence, uncomplicated: Secondary | ICD-10-CM | POA: Diagnosis not present

## 2018-05-04 DIAGNOSIS — Z6828 Body mass index (BMI) 28.0-28.9, adult: Secondary | ICD-10-CM | POA: Diagnosis not present

## 2018-05-27 DIAGNOSIS — R42 Dizziness and giddiness: Secondary | ICD-10-CM | POA: Diagnosis not present

## 2018-05-27 DIAGNOSIS — D509 Iron deficiency anemia, unspecified: Secondary | ICD-10-CM | POA: Diagnosis not present

## 2018-05-27 DIAGNOSIS — I10 Essential (primary) hypertension: Secondary | ICD-10-CM | POA: Diagnosis not present

## 2018-05-27 DIAGNOSIS — E876 Hypokalemia: Secondary | ICD-10-CM | POA: Diagnosis not present

## 2018-05-27 DIAGNOSIS — E1122 Type 2 diabetes mellitus with diabetic chronic kidney disease: Secondary | ICD-10-CM | POA: Diagnosis not present

## 2018-05-27 DIAGNOSIS — N184 Chronic kidney disease, stage 4 (severe): Secondary | ICD-10-CM | POA: Diagnosis not present

## 2018-05-27 DIAGNOSIS — R5383 Other fatigue: Secondary | ICD-10-CM | POA: Diagnosis not present

## 2018-05-27 DIAGNOSIS — E782 Mixed hyperlipidemia: Secondary | ICD-10-CM | POA: Diagnosis not present

## 2018-05-27 DIAGNOSIS — R748 Abnormal levels of other serum enzymes: Secondary | ICD-10-CM | POA: Diagnosis not present

## 2018-05-27 DIAGNOSIS — K210 Gastro-esophageal reflux disease with esophagitis: Secondary | ICD-10-CM | POA: Diagnosis not present

## 2018-05-27 DIAGNOSIS — D649 Anemia, unspecified: Secondary | ICD-10-CM | POA: Diagnosis not present

## 2018-05-27 DIAGNOSIS — R945 Abnormal results of liver function studies: Secondary | ICD-10-CM | POA: Diagnosis not present

## 2018-05-31 DIAGNOSIS — E782 Mixed hyperlipidemia: Secondary | ICD-10-CM | POA: Diagnosis not present

## 2018-05-31 DIAGNOSIS — R1011 Right upper quadrant pain: Secondary | ICD-10-CM | POA: Diagnosis not present

## 2018-05-31 DIAGNOSIS — I69351 Hemiplegia and hemiparesis following cerebral infarction affecting right dominant side: Secondary | ICD-10-CM | POA: Diagnosis not present

## 2018-05-31 DIAGNOSIS — I10 Essential (primary) hypertension: Secondary | ICD-10-CM | POA: Diagnosis not present

## 2018-05-31 DIAGNOSIS — Z1389 Encounter for screening for other disorder: Secondary | ICD-10-CM | POA: Diagnosis not present

## 2018-05-31 DIAGNOSIS — Z6830 Body mass index (BMI) 30.0-30.9, adult: Secondary | ICD-10-CM | POA: Diagnosis not present

## 2018-05-31 DIAGNOSIS — Z23 Encounter for immunization: Secondary | ICD-10-CM | POA: Diagnosis not present

## 2018-05-31 DIAGNOSIS — K56600 Partial intestinal obstruction, unspecified as to cause: Secondary | ICD-10-CM | POA: Diagnosis not present

## 2018-06-02 DIAGNOSIS — N183 Chronic kidney disease, stage 3 (moderate): Secondary | ICD-10-CM | POA: Diagnosis not present

## 2018-06-02 DIAGNOSIS — F1120 Opioid dependence, uncomplicated: Secondary | ICD-10-CM | POA: Diagnosis not present

## 2018-06-02 DIAGNOSIS — E1165 Type 2 diabetes mellitus with hyperglycemia: Secondary | ICD-10-CM | POA: Diagnosis not present

## 2018-06-02 DIAGNOSIS — R52 Pain, unspecified: Secondary | ICD-10-CM | POA: Diagnosis not present

## 2018-06-02 DIAGNOSIS — I69351 Hemiplegia and hemiparesis following cerebral infarction affecting right dominant side: Secondary | ICD-10-CM | POA: Diagnosis not present

## 2018-06-02 DIAGNOSIS — E1122 Type 2 diabetes mellitus with diabetic chronic kidney disease: Secondary | ICD-10-CM | POA: Diagnosis not present

## 2018-06-02 DIAGNOSIS — F332 Major depressive disorder, recurrent severe without psychotic features: Secondary | ICD-10-CM | POA: Diagnosis not present

## 2018-06-02 DIAGNOSIS — D692 Other nonthrombocytopenic purpura: Secondary | ICD-10-CM | POA: Diagnosis not present

## 2018-06-02 DIAGNOSIS — Z794 Long term (current) use of insulin: Secondary | ICD-10-CM | POA: Diagnosis not present

## 2018-06-29 DIAGNOSIS — G8191 Hemiplegia, unspecified affecting right dominant side: Secondary | ICD-10-CM | POA: Diagnosis not present

## 2018-07-02 DIAGNOSIS — R531 Weakness: Secondary | ICD-10-CM | POA: Diagnosis not present

## 2018-07-02 DIAGNOSIS — R51 Headache: Secondary | ICD-10-CM | POA: Diagnosis not present

## 2018-07-02 DIAGNOSIS — R42 Dizziness and giddiness: Secondary | ICD-10-CM | POA: Diagnosis not present

## 2018-07-02 DIAGNOSIS — G319 Degenerative disease of nervous system, unspecified: Secondary | ICD-10-CM | POA: Diagnosis not present

## 2018-07-02 DIAGNOSIS — I7389 Other specified peripheral vascular diseases: Secondary | ICD-10-CM | POA: Diagnosis not present

## 2018-07-02 DIAGNOSIS — G8191 Hemiplegia, unspecified affecting right dominant side: Secondary | ICD-10-CM | POA: Diagnosis not present

## 2018-08-09 ENCOUNTER — Emergency Department (HOSPITAL_COMMUNITY)
Admission: EM | Admit: 2018-08-09 | Discharge: 2018-08-09 | Disposition: A | Discharge status: Home / Self Care | Payer: Medicare HMO | Attending: Emergency Medicine | Admitting: Emergency Medicine

## 2018-08-09 ENCOUNTER — Other Ambulatory Visit: Payer: Self-pay

## 2018-08-09 ENCOUNTER — Encounter (HOSPITAL_COMMUNITY): Payer: Self-pay | Admitting: Emergency Medicine

## 2018-08-09 DIAGNOSIS — I130 Hypertensive heart and chronic kidney disease with heart failure and stage 1 through stage 4 chronic kidney disease, or unspecified chronic kidney disease: Secondary | ICD-10-CM | POA: Insufficient documentation

## 2018-08-09 DIAGNOSIS — N939 Abnormal uterine and vaginal bleeding, unspecified: Secondary | ICD-10-CM | POA: Diagnosis present

## 2018-08-09 DIAGNOSIS — N3000 Acute cystitis without hematuria: Secondary | ICD-10-CM | POA: Diagnosis not present

## 2018-08-09 DIAGNOSIS — Z794 Long term (current) use of insulin: Secondary | ICD-10-CM | POA: Diagnosis not present

## 2018-08-09 DIAGNOSIS — Z8673 Personal history of transient ischemic attack (TIA), and cerebral infarction without residual deficits: Secondary | ICD-10-CM | POA: Insufficient documentation

## 2018-08-09 DIAGNOSIS — Z79899 Other long term (current) drug therapy: Secondary | ICD-10-CM | POA: Diagnosis not present

## 2018-08-09 DIAGNOSIS — N183 Chronic kidney disease, stage 3 (moderate): Secondary | ICD-10-CM | POA: Diagnosis not present

## 2018-08-09 DIAGNOSIS — I509 Heart failure, unspecified: Secondary | ICD-10-CM | POA: Insufficient documentation

## 2018-08-09 DIAGNOSIS — Z7901 Long term (current) use of anticoagulants: Secondary | ICD-10-CM | POA: Insufficient documentation

## 2018-08-09 DIAGNOSIS — E1122 Type 2 diabetes mellitus with diabetic chronic kidney disease: Secondary | ICD-10-CM | POA: Diagnosis not present

## 2018-08-09 LAB — CBC
HCT: 33.2 % — ABNORMAL LOW (ref 36.0–46.0)
Hemoglobin: 10 g/dL — ABNORMAL LOW (ref 12.0–15.0)
MCH: 27.5 pg (ref 26.0–34.0)
MCHC: 30.1 g/dL (ref 30.0–36.0)
MCV: 91.2 fL (ref 80.0–100.0)
PLATELETS: 251 10*3/uL (ref 150–400)
RBC: 3.64 MIL/uL — AB (ref 3.87–5.11)
RDW: 13.7 % (ref 11.5–15.5)
WBC: 10.2 10*3/uL (ref 4.0–10.5)
nRBC: 0 % (ref 0.0–0.2)

## 2018-08-09 LAB — BASIC METABOLIC PANEL
Anion gap: 6 (ref 5–15)
BUN: 14 mg/dL (ref 8–23)
CO2: 23 mmol/L (ref 22–32)
CREATININE: 1.32 mg/dL — AB (ref 0.44–1.00)
Calcium: 8.5 mg/dL — ABNORMAL LOW (ref 8.9–10.3)
Chloride: 106 mmol/L (ref 98–111)
GFR calc Af Amer: 42 mL/min — ABNORMAL LOW (ref >60)
GFR, EST NON AFRICAN AMERICAN: 36 mL/min — AB (ref >60)
GLUCOSE: 269 mg/dL — AB (ref 70–99)
POTASSIUM: 4.3 mmol/L (ref 3.5–5.1)
SODIUM: 135 mmol/L (ref 135–145)

## 2018-08-09 LAB — URINALYSIS, ROUTINE W REFLEX MICROSCOPIC
BILIRUBIN URINE: NEGATIVE
GLUCOSE, UA: NEGATIVE mg/dL
KETONES UR: NEGATIVE mg/dL
NITRITE: POSITIVE — AB
PROTEIN: 30 mg/dL — AB
Specific Gravity, Urine: 1.016 (ref 1.005–1.030)
pH: 6 (ref 5.0–8.0)

## 2018-08-09 LAB — POC OCCULT BLOOD, ED: Fecal Occult Bld: NEGATIVE

## 2018-08-09 MED ORDER — CEPHALEXIN 500 MG PO CAPS: 500.0000 mg | ORAL_CAPSULE | Freq: Four times a day (QID) | ORAL | 0 refills | Status: DC

## 2018-08-09 MED ORDER — CEPHALEXIN 500 MG PO CAPS
500.0000 mg | ORAL_CAPSULE | Freq: Once | ORAL | Status: AC
  Administered 2018-08-09: 500 mg via ORAL
  Filled 2018-08-09: qty 1

## 2018-08-09 NOTE — ED Triage Notes (Addendum)
Pt c/o bright red vaginal bleeding that began yesterday. States she sees blood every time she wipes after urinating. Family reports large clots. Pt denies any pain, but states she did fall on Saturday. Also has RT hand swelling.

## 2018-08-09 NOTE — Discharge Instructions (Signed)
Not able to identify specific source for the bleeding here today.  Urinalysis is consistent with urinary tract infection.  Urine culture sent.  Take the Keflex antibiotic as directed for the next 7 days.  First dose provided here today.  Return for any new or worse symptoms to include any worse bleeding.  Make an appointment to follow-up with your regular doctor.

## 2018-08-09 NOTE — ED Provider Notes (Signed)
[facility] EMERGENCY DEPARTMENT Provider Note   CSN: [id number] Arrival date & time: 08/09/18  1055     History   Chief Complaint Chief Complaint  Patient presents with  . Vaginal Bleeding    HPI Norma [name] is a 84 y.o. female.  Patient brought in by family members.  For complaint of vaginal bleeding that started yesterday.  No clots no pools of blood.  Sees blood every time she wipes after urinating.  No abdominal pain no burning with urination.  Patient's had a hysterectomy many years ago.  No blood in the toilet water.  Patient did have a fall on Saturday.  But had no complaints.  Patient is on Plavix.  No history of syncope.     Past Medical History:  Diagnosis Date  . Acute systolic heart failure ([place])   . Anemia of other chronic disease   . Chronic kidney disease, unspecified   . Chronic pain syndrome   . Congestive heart failure, unspecified   . Coronary atherosclerosis of native coronary artery   . History of blood transfusion 1960's   "when my last child was born" (12/23/2012)  . Hypoxemia   . Other chronic pulmonary heart diseases   . Other primary cardiomyopathies   . Other specified forms of chronic ischemic heart disease   . Pneumonia    "when I was little" (12/23/2012)  . Stroke [facility]) ~ 2011   "slowed my walking" (12/23/2012)  . Type II diabetes mellitus ([place])   . Unspecified hypertensive kidney disease with chronic kidney disease stage I through stage IV, or unspecified(403.90)   . Unspecified pleural effusion     Patient Active Problem List   Diagnosis Date Noted  . Complicated UTI (urinary tract infection) 07/29/2017  . Bacteremia due to Klebsiella pneumoniae 07/28/2017  . Klebsiella sepsis ([place]) 07/28/2017  . Acute pyelonephritis 07/27/2017  . Acute renal failure superimposed on stage 3 chronic kidney disease ([place]) 07/26/2017  . Uncontrolled type 2 diabetes mellitus with hyperglycemia, with long-term current use of insulin ([place]) 07/26/2017    . Acute metabolic encephalopathy 84/53/6468  . Sepsis ([place]) 06/18/2017  . Acute renal failure superimposed on stage 2 chronic kidney disease ([place]) 06/18/2017  . Abdominal pain   . Urinary tract infection without hematuria   . Lower urinary tract infectious disease 02/26/2016  . Weakness generalized 02/26/2016  . Weakness 02/26/2016  . DM2 (diabetes mellitus, type 2) ([place]) 02/26/2016  . Hemoptysis 12/24/2012  . Dyspnea on exertion 03/08/2012  . Palpitations 02/05/2012  . Pulmonary nodule 02/05/2012  . Nausea and vomiting 01/03/2011  . Dilated cardiomyopathy ([place]) 12/04/2010  . Acute on chronic congestive heart failure ([place]) 12/04/2010  . Abnormal EKG 12/04/2010  . Pulmonary hypertension ([place]) 12/04/2010  . Hypotension 12/04/2010  . Chronic kidney disease (CKD), stage III (moderate) ([place]) 12/04/2010  . CAD (coronary artery disease) 12/04/2010    Past Surgical History:  Procedure Laterality Date  . ABDOMINAL HYSTERECTOMY    . APPENDECTOMY    . CATARACT EXTRACTION, BILATERAL    . CHOLECYSTECTOMY    . DILATION AND CURETTAGE OF UTERUS    . PATELLA RECONSTRUCTION Bilateral    "had my knee caps replaced" (12/23/2012)  . TONSILLECTOMY       OB History   None      Home Medications    Prior to Admission medications   Medication Sig Start Date End Date Taking? Authorizing Provider  buPROPion (WELLBUTRIN XL) 150 MG 24 hr tablet Take 150 mg by mouth  daily.  06/01/18  Yes [provider]  carvedilol (COREG) 6.25 MG tablet Take 1 tablet (6.25 mg total) 2 (two) times daily with a meal by mouth. 07/29/17  Yes Rizwan, Eunice Blase, MD  clopidogrel (PLAVIX) 75 MG tablet Take 75 mg by mouth daily.   Yes [provider]  Cranberry 300 MG tablet Take 1 tablet (300 mg total) every other day by mouth. 07/29/17  Yes Rizwan, Eunice Blase, MD  DULoxetine (CYMBALTA) 60 MG capsule Take 60 mg by mouth 2 (two) times daily.    Yes [provider]  furosemide (LASIX) 40 MG tablet Take 40  mg by mouth daily as needed for fluid.  12/04/10  Yes de Stanford Scotland, MD  insulin detemir (LEVEMIR) 100 UNIT/ML injection Inject 0.25 mLs (25 Units total) at bedtime into the skin. Patient taking differently: Inject 20 Units into the skin at bedtime.  07/29/17  Yes Debbe Odea, MD  oxyCODONE-acetaminophen (PERCOCET) 10-325 MG tablet Take 1 tablet 4 (four) times daily by mouth. 07/29/17  Yes Rizwan, Saima, MD  pantoprazole (PROTONIX) 40 MG tablet Take 1 tablet by mouth at bedtime.  02/11/16  Yes [provider]  potassium chloride (K-DUR) 10 MEQ tablet Take 1 tablet by mouth daily.  02/18/16  Yes [provider]  saccharomyces boulardii (FLORASTOR) 250 MG capsule Take 1 capsule (250 mg total) 2 (two) times daily by mouth. 07/29/17  Yes Rizwan, Saima, MD  simvastatin (ZOCOR) 20 MG tablet Take 20 mg by mouth every evening.    Yes [provider]  cephALEXin (KEFLEX) 500 MG capsule Take 1 capsule (500 mg total) by mouth 4 (four) times daily. 08/09/18   Fredia Sorrow, MD  ciprofloxacin (CIPRO) 500 MG tablet Take 1 tablet (500 mg total) daily by mouth. Patient not taking: Reported on 08/17/2017 07/30/17   Debbe Odea, MD    Family History Family History  Problem Relation Age of Onset  . Heart attack Father 51       MI  . Cancer Father        leukemia  . Cancer Brother   . Heart disease Daughter     Social History Social History   Tobacco Use  . Smoking status: Never Smoker  . Smokeless tobacco: Never Used  Substance Use Topics  . Alcohol use: No  . Drug use: No     Allergies   Patient has no known allergies.   Review of Systems Review of Systems  Constitutional: Negative for fever.  HENT: Negative for congestion.   Eyes: Negative for visual disturbance.  Respiratory: Negative for shortness of breath.   Cardiovascular: Negative for chest pain.  Gastrointestinal: Negative for abdominal pain, blood in stool, diarrhea, nausea and vomiting.    Genitourinary: Positive for vaginal bleeding. Negative for dysuria and hematuria.  Musculoskeletal: Negative for back pain.  Skin: Negative for rash.  Neurological: Negative for syncope and headaches.  Hematological: Does not bruise/bleed easily.     Physical Exam Updated Vital Signs BP 119/72 (BP Location: Left Arm)   Pulse 71   Temp 98.9 F (37.2 C) (Oral)   Resp 14   Ht 1.6 m (5\' 3" )   Wt 80 kg   SpO2 95%   BMI 31.24 kg/m   Physical Exam  Constitutional: She appears well-developed and well-nourished. No distress.  HENT:  Head: Normocephalic and atraumatic.  Mouth/Throat: Oropharynx is clear and moist.  Eyes: Pupils are equal, round, and reactive to light. Conjunctivae and EOM are normal.  Neck: Normal  range of motion. Neck supple.  Cardiovascular: Normal rate, regular rhythm and normal heart sounds.  Pulmonary/Chest: Effort normal and breath sounds normal.  Abdominal: Soft. Bowel sounds are normal. There is no tenderness.  Genitourinary: Rectal exam shows guaiac negative stool. No vaginal discharge found.  Genitourinary Comments: Rectal exam heme-negative.  No evidence of any blood at the vaginal area.  Nurse reported that underwear had a little bit of streaking of some brown blood.  Musculoskeletal: Normal range of motion.  No back tenderness  Neurological: She is alert. No cranial nerve deficit or sensory deficit. Coordination normal.  Skin: Skin is warm.  Nursing note and vitals reviewed.    ED Treatments / Results  Labs (all labs ordered are listed, but only abnormal results are displayed) Labs Reviewed  URINALYSIS, ROUTINE W REFLEX MICROSCOPIC - Abnormal; Notable for the following components:      Result Value   Color, Urine AMBER (*)    APPearance CLOUDY (*)    Hgb urine dipstick LARGE (*)    Protein, ur 30 (*)    Nitrite POSITIVE (*)    Leukocytes, UA TRACE (*)    Bacteria, UA MANY (*)    All other components within normal limits  CBC - Abnormal;  Notable for the following components:   RBC 3.64 (*)    Hemoglobin 10.0 (*)    HCT 33.2 (*)    All other components within normal limits  BASIC METABOLIC PANEL - Abnormal; Notable for the following components:   Glucose, Bld 269 (*)    Creatinine, Ser 1.32 (*)    Calcium 8.5 (*)    GFR calc non Af Amer 36 (*)    GFR calc Af Amer 42 (*)    All other components within normal limits  URINE CULTURE  POC OCCULT BLOOD, ED    EKG None  Radiology No results found.  Procedures Procedures (including critical care time)  Medications Ordered in ED Medications  cephALEXin (KEFLEX) capsule 500 mg (has no administration in time range)     Initial Impression / Assessment and Plan / ED Course  I have reviewed the triage vital signs and the nursing notes.  Pertinent labs & imaging results that were available during my care of the patient were reviewed by me and considered in my medical decision making (see chart for details).    Rectal exam negative for any blood.  No obvious blood from the vaginal area.  Patient has had a hysterectomy.  Urinalysis consistent with urinary tract infection but no significant hematuria with it.  Exact cause of the bleeding may very well be the urinary tract.  No evidence of any significant or large amount of bleeding historically or on exam.  Urine culture sent we will treat with Keflex.  We will have her follow-up with her primary care doctor.  They will have him return for any worse bleeding.  Patient nontoxic no acute distress.   Final Clinical Impressions(s) / ED Diagnoses   Final diagnoses:  Acute cystitis without hematuria    ED Discharge Orders         Ordered    cephALEXin (KEFLEX) 500 MG capsule  4 times daily     08/09/18 1453           Fredia Sorrow, MD 08/09/18 1704

## 2018-08-16 LAB — SUSCEPTIBILITY, AER + ANAEROB

## 2018-08-16 LAB — SUSCEPTIBILITY RESULT

## 2018-08-19 LAB — URINE CULTURE: Culture: >=100000 — AB

## 2018-08-20 ENCOUNTER — Telehealth: Payer: Self-pay | Admitting: *Deleted

## 2018-08-20 DIAGNOSIS — K210 Gastro-esophageal reflux disease with esophagitis: Secondary | ICD-10-CM | POA: Diagnosis not present

## 2018-08-20 DIAGNOSIS — E782 Mixed hyperlipidemia: Secondary | ICD-10-CM | POA: Diagnosis not present

## 2018-08-20 DIAGNOSIS — G301 Alzheimer's disease with late onset: Secondary | ICD-10-CM | POA: Diagnosis not present

## 2018-08-20 DIAGNOSIS — N184 Chronic kidney disease, stage 4 (severe): Secondary | ICD-10-CM | POA: Diagnosis not present

## 2018-08-20 DIAGNOSIS — F331 Major depressive disorder, recurrent, moderate: Secondary | ICD-10-CM | POA: Diagnosis not present

## 2018-08-20 DIAGNOSIS — E1122 Type 2 diabetes mellitus with diabetic chronic kidney disease: Secondary | ICD-10-CM | POA: Diagnosis not present

## 2018-08-20 DIAGNOSIS — I10 Essential (primary) hypertension: Secondary | ICD-10-CM | POA: Diagnosis not present

## 2018-08-20 NOTE — Telephone Encounter (Signed)
Post ED Visit - Positive Culture Follow-up  Culture report reviewed by antimicrobial stewardship pharmacist:  []  [name], Pharm.D. []  [name], Pharm.D., BCPS AQ-ID []  [name], Pharm.D., BCPS []  [name], Pharm.D., BCPS []  [name], Pharm.D., BCPS, AAHIVP []  [name], Pharm.D., BCPS, AAHIVP []  [name], PharmD, BCPS []  [name], PharmD, BCPS [x]  [name], PharmD, BCPS []  [name], PharmD  Positive urine culture, reviewed by [name], PA-C Treated with Cephalexin.  Symptom check completed with no further symptoms and feeling better.  No further patient follow-up is required at this time.  [name] [facility] 08/20/2018, 8:39 AM

## 2018-08-23 DIAGNOSIS — J189 Pneumonia, unspecified organism: Secondary | ICD-10-CM | POA: Diagnosis not present

## 2018-08-23 DIAGNOSIS — R509 Fever, unspecified: Secondary | ICD-10-CM | POA: Diagnosis not present

## 2018-08-23 DIAGNOSIS — R05 Cough: Secondary | ICD-10-CM | POA: Diagnosis not present

## 2018-08-25 DIAGNOSIS — G301 Alzheimer's disease with late onset: Secondary | ICD-10-CM | POA: Diagnosis not present

## 2018-08-25 DIAGNOSIS — E1122 Type 2 diabetes mellitus with diabetic chronic kidney disease: Secondary | ICD-10-CM | POA: Diagnosis not present

## 2018-08-25 DIAGNOSIS — I700 Atherosclerosis of aorta: Secondary | ICD-10-CM | POA: Diagnosis not present

## 2018-08-25 DIAGNOSIS — N184 Chronic kidney disease, stage 4 (severe): Secondary | ICD-10-CM | POA: Diagnosis not present

## 2018-08-25 DIAGNOSIS — I10 Essential (primary) hypertension: Secondary | ICD-10-CM | POA: Diagnosis not present

## 2018-08-25 DIAGNOSIS — I69351 Hemiplegia and hemiparesis following cerebral infarction affecting right dominant side: Secondary | ICD-10-CM | POA: Diagnosis not present

## 2018-08-25 DIAGNOSIS — D692 Other nonthrombocytopenic purpura: Secondary | ICD-10-CM | POA: Diagnosis not present

## 2018-09-13 DIAGNOSIS — E782 Mixed hyperlipidemia: Secondary | ICD-10-CM | POA: Diagnosis not present

## 2018-09-13 DIAGNOSIS — E1122 Type 2 diabetes mellitus with diabetic chronic kidney disease: Secondary | ICD-10-CM | POA: Diagnosis not present

## 2018-09-13 DIAGNOSIS — K219 Gastro-esophageal reflux disease without esophagitis: Secondary | ICD-10-CM | POA: Diagnosis not present

## 2018-09-13 DIAGNOSIS — I10 Essential (primary) hypertension: Secondary | ICD-10-CM | POA: Diagnosis not present

## 2018-09-27 DIAGNOSIS — I69351 Hemiplegia and hemiparesis following cerebral infarction affecting right dominant side: Secondary | ICD-10-CM | POA: Diagnosis not present

## 2018-09-27 DIAGNOSIS — K739 Chronic hepatitis, unspecified: Secondary | ICD-10-CM | POA: Diagnosis not present

## 2018-09-27 DIAGNOSIS — Z794 Long term (current) use of insulin: Secondary | ICD-10-CM | POA: Diagnosis not present

## 2018-09-27 DIAGNOSIS — Z6827 Body mass index (BMI) 27.0-27.9, adult: Secondary | ICD-10-CM | POA: Diagnosis not present

## 2018-09-27 DIAGNOSIS — F332 Major depressive disorder, recurrent severe without psychotic features: Secondary | ICD-10-CM | POA: Diagnosis not present

## 2018-09-27 DIAGNOSIS — E1122 Type 2 diabetes mellitus with diabetic chronic kidney disease: Secondary | ICD-10-CM | POA: Diagnosis not present

## 2018-09-27 DIAGNOSIS — E1165 Type 2 diabetes mellitus with hyperglycemia: Secondary | ICD-10-CM | POA: Diagnosis not present

## 2018-09-27 DIAGNOSIS — S85902D Unspecified injury of unspecified blood vessel at lower leg level, left leg, subsequent encounter: Secondary | ICD-10-CM | POA: Diagnosis not present

## 2018-09-27 DIAGNOSIS — R52 Pain, unspecified: Secondary | ICD-10-CM | POA: Diagnosis not present

## 2018-09-27 DIAGNOSIS — E46 Unspecified protein-calorie malnutrition: Secondary | ICD-10-CM | POA: Diagnosis not present

## 2018-09-27 DIAGNOSIS — F1120 Opioid dependence, uncomplicated: Secondary | ICD-10-CM | POA: Diagnosis not present

## 2018-09-27 DIAGNOSIS — F0390 Unspecified dementia without behavioral disturbance: Secondary | ICD-10-CM | POA: Diagnosis not present

## 2018-09-27 DIAGNOSIS — Z7902 Long term (current) use of antithrombotics/antiplatelets: Secondary | ICD-10-CM | POA: Diagnosis not present

## 2018-09-27 DIAGNOSIS — Z96653 Presence of artificial knee joint, bilateral: Secondary | ICD-10-CM | POA: Diagnosis not present

## 2018-09-27 DIAGNOSIS — D692 Other nonthrombocytopenic purpura: Secondary | ICD-10-CM | POA: Diagnosis not present

## 2018-11-29 DIAGNOSIS — R739 Hyperglycemia, unspecified: Secondary | ICD-10-CM | POA: Diagnosis not present

## 2018-11-29 DIAGNOSIS — N184 Chronic kidney disease, stage 4 (severe): Secondary | ICD-10-CM | POA: Diagnosis not present

## 2018-11-29 DIAGNOSIS — G301 Alzheimer's disease with late onset: Secondary | ICD-10-CM | POA: Diagnosis not present

## 2018-11-29 DIAGNOSIS — K210 Gastro-esophageal reflux disease with esophagitis: Secondary | ICD-10-CM | POA: Diagnosis not present

## 2018-11-29 DIAGNOSIS — R5383 Other fatigue: Secondary | ICD-10-CM | POA: Diagnosis not present

## 2018-11-29 DIAGNOSIS — R748 Abnormal levels of other serum enzymes: Secondary | ICD-10-CM | POA: Diagnosis not present

## 2018-11-29 DIAGNOSIS — R945 Abnormal results of liver function studies: Secondary | ICD-10-CM | POA: Diagnosis not present

## 2018-11-29 DIAGNOSIS — I10 Essential (primary) hypertension: Secondary | ICD-10-CM | POA: Diagnosis not present

## 2018-11-29 DIAGNOSIS — E876 Hypokalemia: Secondary | ICD-10-CM | POA: Diagnosis not present

## 2018-11-29 DIAGNOSIS — D649 Anemia, unspecified: Secondary | ICD-10-CM | POA: Diagnosis not present

## 2018-11-29 DIAGNOSIS — R42 Dizziness and giddiness: Secondary | ICD-10-CM | POA: Diagnosis not present

## 2018-11-29 DIAGNOSIS — E1122 Type 2 diabetes mellitus with diabetic chronic kidney disease: Secondary | ICD-10-CM | POA: Diagnosis not present

## 2018-12-03 DIAGNOSIS — F331 Major depressive disorder, recurrent, moderate: Secondary | ICD-10-CM | POA: Diagnosis not present

## 2018-12-03 DIAGNOSIS — I10 Essential (primary) hypertension: Secondary | ICD-10-CM | POA: Diagnosis not present

## 2018-12-03 DIAGNOSIS — G301 Alzheimer's disease with late onset: Secondary | ICD-10-CM | POA: Diagnosis not present

## 2018-12-03 DIAGNOSIS — G252 Other specified forms of tremor: Secondary | ICD-10-CM | POA: Diagnosis not present

## 2018-12-03 DIAGNOSIS — E1122 Type 2 diabetes mellitus with diabetic chronic kidney disease: Secondary | ICD-10-CM | POA: Diagnosis not present

## 2018-12-03 DIAGNOSIS — R300 Dysuria: Secondary | ICD-10-CM | POA: Diagnosis not present

## 2018-12-03 DIAGNOSIS — J301 Allergic rhinitis due to pollen: Secondary | ICD-10-CM | POA: Diagnosis not present

## 2018-12-03 DIAGNOSIS — Z9189 Other specified personal risk factors, not elsewhere classified: Secondary | ICD-10-CM | POA: Diagnosis not present

## 2018-12-03 DIAGNOSIS — Z23 Encounter for immunization: Secondary | ICD-10-CM | POA: Diagnosis not present

## 2018-12-03 DIAGNOSIS — I69351 Hemiplegia and hemiparesis following cerebral infarction affecting right dominant side: Secondary | ICD-10-CM | POA: Diagnosis not present

## 2018-12-03 DIAGNOSIS — N184 Chronic kidney disease, stage 4 (severe): Secondary | ICD-10-CM | POA: Diagnosis not present

## 2018-12-03 DIAGNOSIS — E782 Mixed hyperlipidemia: Secondary | ICD-10-CM | POA: Diagnosis not present

## 2018-12-03 DIAGNOSIS — D692 Other nonthrombocytopenic purpura: Secondary | ICD-10-CM | POA: Diagnosis not present

## 2018-12-03 DIAGNOSIS — K219 Gastro-esophageal reflux disease without esophagitis: Secondary | ICD-10-CM | POA: Diagnosis not present

## 2018-12-04 DIAGNOSIS — C44311 Basal cell carcinoma of skin of nose: Secondary | ICD-10-CM | POA: Diagnosis not present

## 2018-12-04 DIAGNOSIS — C44301 Unspecified malignant neoplasm of skin of nose: Secondary | ICD-10-CM | POA: Diagnosis not present

## 2018-12-04 DIAGNOSIS — C44319 Basal cell carcinoma of skin of other parts of face: Secondary | ICD-10-CM | POA: Diagnosis not present

## 2019-01-11 DIAGNOSIS — R300 Dysuria: Secondary | ICD-10-CM | POA: Diagnosis not present

## 2019-01-11 DIAGNOSIS — N3090 Cystitis, unspecified without hematuria: Secondary | ICD-10-CM | POA: Diagnosis not present

## 2019-01-20 DIAGNOSIS — E1142 Type 2 diabetes mellitus with diabetic polyneuropathy: Secondary | ICD-10-CM | POA: Diagnosis not present

## 2019-01-20 DIAGNOSIS — B351 Tinea unguium: Secondary | ICD-10-CM | POA: Diagnosis not present

## 2019-01-20 DIAGNOSIS — M79676 Pain in unspecified toe(s): Secondary | ICD-10-CM | POA: Diagnosis not present

## 2019-01-20 DIAGNOSIS — L84 Corns and callosities: Secondary | ICD-10-CM | POA: Diagnosis not present

## 2019-02-28 DIAGNOSIS — K210 Gastro-esophageal reflux disease with esophagitis: Secondary | ICD-10-CM | POA: Diagnosis not present

## 2019-02-28 DIAGNOSIS — D649 Anemia, unspecified: Secondary | ICD-10-CM | POA: Diagnosis not present

## 2019-02-28 DIAGNOSIS — E876 Hypokalemia: Secondary | ICD-10-CM | POA: Diagnosis not present

## 2019-02-28 DIAGNOSIS — R42 Dizziness and giddiness: Secondary | ICD-10-CM | POA: Diagnosis not present

## 2019-02-28 DIAGNOSIS — E782 Mixed hyperlipidemia: Secondary | ICD-10-CM | POA: Diagnosis not present

## 2019-02-28 DIAGNOSIS — R5383 Other fatigue: Secondary | ICD-10-CM | POA: Diagnosis not present

## 2019-02-28 DIAGNOSIS — I10 Essential (primary) hypertension: Secondary | ICD-10-CM | POA: Diagnosis not present

## 2019-02-28 DIAGNOSIS — E1122 Type 2 diabetes mellitus with diabetic chronic kidney disease: Secondary | ICD-10-CM | POA: Diagnosis not present

## 2019-02-28 DIAGNOSIS — E119 Type 2 diabetes mellitus without complications: Secondary | ICD-10-CM | POA: Diagnosis not present

## 2019-02-28 DIAGNOSIS — Z0001 Encounter for general adult medical examination with abnormal findings: Secondary | ICD-10-CM | POA: Diagnosis not present

## 2019-02-28 DIAGNOSIS — N184 Chronic kidney disease, stage 4 (severe): Secondary | ICD-10-CM | POA: Diagnosis not present

## 2019-02-28 DIAGNOSIS — I69351 Hemiplegia and hemiparesis following cerebral infarction affecting right dominant side: Secondary | ICD-10-CM | POA: Diagnosis not present

## 2019-03-07 DIAGNOSIS — N184 Chronic kidney disease, stage 4 (severe): Secondary | ICD-10-CM | POA: Diagnosis not present

## 2019-03-07 DIAGNOSIS — N183 Chronic kidney disease, stage 3 (moderate): Secondary | ICD-10-CM | POA: Diagnosis not present

## 2019-03-07 DIAGNOSIS — E782 Mixed hyperlipidemia: Secondary | ICD-10-CM | POA: Diagnosis not present

## 2019-03-07 DIAGNOSIS — I10 Essential (primary) hypertension: Secondary | ICD-10-CM | POA: Diagnosis not present

## 2019-03-07 DIAGNOSIS — J301 Allergic rhinitis due to pollen: Secondary | ICD-10-CM | POA: Diagnosis not present

## 2019-03-07 DIAGNOSIS — Z1389 Encounter for screening for other disorder: Secondary | ICD-10-CM | POA: Diagnosis not present

## 2019-03-07 DIAGNOSIS — I69351 Hemiplegia and hemiparesis following cerebral infarction affecting right dominant side: Secondary | ICD-10-CM | POA: Diagnosis not present

## 2019-03-07 DIAGNOSIS — E1122 Type 2 diabetes mellitus with diabetic chronic kidney disease: Secondary | ICD-10-CM | POA: Diagnosis not present

## 2019-03-07 DIAGNOSIS — I700 Atherosclerosis of aorta: Secondary | ICD-10-CM | POA: Diagnosis not present

## 2019-03-07 DIAGNOSIS — G301 Alzheimer's disease with late onset: Secondary | ICD-10-CM | POA: Diagnosis not present

## 2019-03-07 DIAGNOSIS — Z23 Encounter for immunization: Secondary | ICD-10-CM | POA: Diagnosis not present

## 2019-03-07 DIAGNOSIS — D692 Other nonthrombocytopenic purpura: Secondary | ICD-10-CM | POA: Diagnosis not present

## 2019-03-07 DIAGNOSIS — K219 Gastro-esophageal reflux disease without esophagitis: Secondary | ICD-10-CM | POA: Diagnosis not present

## 2019-03-07 DIAGNOSIS — F331 Major depressive disorder, recurrent, moderate: Secondary | ICD-10-CM | POA: Diagnosis not present

## 2019-03-07 DIAGNOSIS — Z9189 Other specified personal risk factors, not elsewhere classified: Secondary | ICD-10-CM | POA: Diagnosis not present

## 2019-03-07 DIAGNOSIS — Z0001 Encounter for general adult medical examination with abnormal findings: Secondary | ICD-10-CM | POA: Diagnosis not present

## 2019-03-28 DIAGNOSIS — N189 Chronic kidney disease, unspecified: Secondary | ICD-10-CM | POA: Diagnosis not present

## 2019-03-28 DIAGNOSIS — F0390 Unspecified dementia without behavioral disturbance: Secondary | ICD-10-CM | POA: Diagnosis not present

## 2019-03-28 DIAGNOSIS — Z794 Long term (current) use of insulin: Secondary | ICD-10-CM | POA: Diagnosis not present

## 2019-03-28 DIAGNOSIS — E1122 Type 2 diabetes mellitus with diabetic chronic kidney disease: Secondary | ICD-10-CM | POA: Diagnosis not present

## 2019-03-28 DIAGNOSIS — Z7902 Long term (current) use of antithrombotics/antiplatelets: Secondary | ICD-10-CM | POA: Diagnosis not present

## 2019-03-28 DIAGNOSIS — I69351 Hemiplegia and hemiparesis following cerebral infarction affecting right dominant side: Secondary | ICD-10-CM | POA: Diagnosis not present

## 2019-03-28 DIAGNOSIS — E1165 Type 2 diabetes mellitus with hyperglycemia: Secondary | ICD-10-CM | POA: Diagnosis not present

## 2019-03-28 DIAGNOSIS — I129 Hypertensive chronic kidney disease with stage 1 through stage 4 chronic kidney disease, or unspecified chronic kidney disease: Secondary | ICD-10-CM | POA: Diagnosis not present

## 2019-04-12 DIAGNOSIS — Z0289 Encounter for other administrative examinations: Secondary | ICD-10-CM | POA: Diagnosis not present

## 2019-04-14 DIAGNOSIS — B351 Tinea unguium: Secondary | ICD-10-CM | POA: Diagnosis not present

## 2019-04-14 DIAGNOSIS — L84 Corns and callosities: Secondary | ICD-10-CM | POA: Diagnosis not present

## 2019-04-14 DIAGNOSIS — M79676 Pain in unspecified toe(s): Secondary | ICD-10-CM | POA: Diagnosis not present

## 2019-04-14 DIAGNOSIS — E1142 Type 2 diabetes mellitus with diabetic polyneuropathy: Secondary | ICD-10-CM | POA: Diagnosis not present

## 2019-04-15 DIAGNOSIS — E782 Mixed hyperlipidemia: Secondary | ICD-10-CM | POA: Diagnosis not present

## 2019-04-15 DIAGNOSIS — I10 Essential (primary) hypertension: Secondary | ICD-10-CM | POA: Diagnosis not present

## 2019-04-17 ENCOUNTER — Observation Stay (HOSPITAL_COMMUNITY): Payer: Medicare HMO

## 2019-04-17 ENCOUNTER — Inpatient Hospital Stay (HOSPITAL_COMMUNITY)
Admission: EM | Admit: 2019-04-17 | Discharge: 2019-04-21 | DRG: 394 | Disposition: A | Discharge status: Home Health | Payer: Medicare HMO | Attending: Internal Medicine | Admitting: Internal Medicine

## 2019-04-17 ENCOUNTER — Encounter (HOSPITAL_COMMUNITY)
Admission: EM | Disposition: A | Discharge status: Home Health | Payer: Self-pay | Source: Home / Self Care | Attending: Internal Medicine

## 2019-04-17 ENCOUNTER — Encounter (HOSPITAL_COMMUNITY): Payer: Self-pay

## 2019-04-17 ENCOUNTER — Other Ambulatory Visit: Payer: Self-pay

## 2019-04-17 ENCOUNTER — Emergency Department (HOSPITAL_COMMUNITY): Payer: Medicare HMO | Admitting: Anesthesiology

## 2019-04-17 DIAGNOSIS — I4891 Unspecified atrial fibrillation: Secondary | ICD-10-CM | POA: Diagnosis present

## 2019-04-17 DIAGNOSIS — R05 Cough: Secondary | ICD-10-CM

## 2019-04-17 DIAGNOSIS — I5042 Chronic combined systolic (congestive) and diastolic (congestive) heart failure: Secondary | ICD-10-CM | POA: Diagnosis present

## 2019-04-17 DIAGNOSIS — N184 Chronic kidney disease, stage 4 (severe): Secondary | ICD-10-CM | POA: Diagnosis present

## 2019-04-17 DIAGNOSIS — Z7902 Long term (current) use of antithrombotics/antiplatelets: Secondary | ICD-10-CM | POA: Diagnosis not present

## 2019-04-17 DIAGNOSIS — Z20828 Contact with and (suspected) exposure to other viral communicable diseases: Secondary | ICD-10-CM | POA: Diagnosis present

## 2019-04-17 DIAGNOSIS — G894 Chronic pain syndrome: Secondary | ICD-10-CM | POA: Diagnosis present

## 2019-04-17 DIAGNOSIS — E1122 Type 2 diabetes mellitus with diabetic chronic kidney disease: Secondary | ICD-10-CM | POA: Diagnosis present

## 2019-04-17 DIAGNOSIS — I272 Pulmonary hypertension, unspecified: Secondary | ICD-10-CM | POA: Diagnosis present

## 2019-04-17 DIAGNOSIS — J810 Acute pulmonary edema: Secondary | ICD-10-CM | POA: Diagnosis not present

## 2019-04-17 DIAGNOSIS — I361 Nonrheumatic tricuspid (valve) insufficiency: Secondary | ICD-10-CM | POA: Diagnosis not present

## 2019-04-17 DIAGNOSIS — R059 Cough, unspecified: Secondary | ICD-10-CM

## 2019-04-17 DIAGNOSIS — K2950 Unspecified chronic gastritis without bleeding: Secondary | ICD-10-CM | POA: Diagnosis present

## 2019-04-17 DIAGNOSIS — I69351 Hemiplegia and hemiparesis following cerebral infarction affecting right dominant side: Secondary | ICD-10-CM

## 2019-04-17 DIAGNOSIS — Z79899 Other long term (current) drug therapy: Secondary | ICD-10-CM | POA: Diagnosis not present

## 2019-04-17 DIAGNOSIS — Z794 Long term (current) use of insulin: Secondary | ICD-10-CM | POA: Diagnosis not present

## 2019-04-17 DIAGNOSIS — Z8673 Personal history of transient ischemic attack (TIA), and cerebral infarction without residual deficits: Secondary | ICD-10-CM | POA: Diagnosis not present

## 2019-04-17 DIAGNOSIS — Z79891 Long term (current) use of opiate analgesic: Secondary | ICD-10-CM | POA: Diagnosis not present

## 2019-04-17 DIAGNOSIS — I130 Hypertensive heart and chronic kidney disease with heart failure and stage 1 through stage 4 chronic kidney disease, or unspecified chronic kidney disease: Secondary | ICD-10-CM | POA: Diagnosis present

## 2019-04-17 DIAGNOSIS — F329 Major depressive disorder, single episode, unspecified: Secondary | ICD-10-CM | POA: Diagnosis present

## 2019-04-17 DIAGNOSIS — Z8249 Family history of ischemic heart disease and other diseases of the circulatory system: Secondary | ICD-10-CM | POA: Diagnosis not present

## 2019-04-17 DIAGNOSIS — K210 Gastro-esophageal reflux disease with esophagitis: Secondary | ICD-10-CM | POA: Diagnosis present

## 2019-04-17 DIAGNOSIS — T18128A Food in esophagus causing other injury, initial encounter: Secondary | ICD-10-CM | POA: Diagnosis present

## 2019-04-17 DIAGNOSIS — D631 Anemia in chronic kidney disease: Secondary | ICD-10-CM | POA: Diagnosis present

## 2019-04-17 DIAGNOSIS — E785 Hyperlipidemia, unspecified: Secondary | ICD-10-CM | POA: Diagnosis present

## 2019-04-17 DIAGNOSIS — Z03818 Encounter for observation for suspected exposure to other biological agents ruled out: Secondary | ICD-10-CM | POA: Diagnosis not present

## 2019-04-17 DIAGNOSIS — I420 Dilated cardiomyopathy: Secondary | ICD-10-CM | POA: Diagnosis present

## 2019-04-17 DIAGNOSIS — N183 Chronic kidney disease, stage 3 unspecified: Secondary | ICD-10-CM | POA: Diagnosis present

## 2019-04-17 DIAGNOSIS — E1165 Type 2 diabetes mellitus with hyperglycemia: Secondary | ICD-10-CM | POA: Diagnosis not present

## 2019-04-17 DIAGNOSIS — Z806 Family history of leukemia: Secondary | ICD-10-CM

## 2019-04-17 DIAGNOSIS — I5030 Unspecified diastolic (congestive) heart failure: Secondary | ICD-10-CM | POA: Diagnosis not present

## 2019-04-17 DIAGNOSIS — R5381 Other malaise: Secondary | ICD-10-CM | POA: Diagnosis not present

## 2019-04-17 DIAGNOSIS — R1314 Dysphagia, pharyngoesophageal phase: Secondary | ICD-10-CM | POA: Diagnosis present

## 2019-04-17 DIAGNOSIS — I2510 Atherosclerotic heart disease of native coronary artery without angina pectoris: Secondary | ICD-10-CM | POA: Diagnosis present

## 2019-04-17 DIAGNOSIS — R7989 Other specified abnormal findings of blood chemistry: Secondary | ICD-10-CM | POA: Diagnosis not present

## 2019-04-17 DIAGNOSIS — I129 Hypertensive chronic kidney disease with stage 1 through stage 4 chronic kidney disease, or unspecified chronic kidney disease: Secondary | ICD-10-CM | POA: Diagnosis not present

## 2019-04-17 DIAGNOSIS — T18128D Food in esophagus causing other injury, subsequent encounter: Secondary | ICD-10-CM | POA: Diagnosis not present

## 2019-04-17 DIAGNOSIS — K222 Esophageal obstruction: Secondary | ICD-10-CM | POA: Diagnosis not present

## 2019-04-17 DIAGNOSIS — I4819 Other persistent atrial fibrillation: Secondary | ICD-10-CM | POA: Diagnosis present

## 2019-04-17 DIAGNOSIS — I5032 Chronic diastolic (congestive) heart failure: Secondary | ICD-10-CM

## 2019-04-17 DIAGNOSIS — Z209 Contact with and (suspected) exposure to unspecified communicable disease: Secondary | ICD-10-CM | POA: Diagnosis not present

## 2019-04-17 DIAGNOSIS — I5043 Acute on chronic combined systolic (congestive) and diastolic (congestive) heart failure: Secondary | ICD-10-CM | POA: Diagnosis not present

## 2019-04-17 HISTORY — PX: ESOPHAGOGASTRODUODENOSCOPY (EGD) WITH PROPOFOL: SHX5813

## 2019-04-17 HISTORY — DX: Chronic kidney disease, stage 3 unspecified: N18.30

## 2019-04-17 HISTORY — PX: FOREIGN BODY REMOVAL: SHX962

## 2019-04-17 HISTORY — DX: Takotsubo syndrome: I51.81

## 2019-04-17 HISTORY — DX: Atherosclerotic heart disease of native coronary artery without angina pectoris: I25.10

## 2019-04-17 LAB — POCT I-STAT 4, (NA,K, GLUC, HGB,HCT)
Glucose, Bld: 191 mg/dL — ABNORMAL HIGH (ref 70–99)
HCT: 38 % (ref 36.0–46.0)
Hemoglobin: 12.9 g/dL (ref 12.0–15.0)
Potassium: 4.6 mmol/L (ref 3.5–5.1)
Sodium: 141 mmol/L (ref 135–145)

## 2019-04-17 LAB — GLUCOSE, CAPILLARY: Glucose-Capillary: 191 mg/dL — ABNORMAL HIGH (ref 70–99)

## 2019-04-17 LAB — SARS CORONAVIRUS 2 BY RT PCR (HOSPITAL ORDER, PERFORMED IN ~~LOC~~ HOSPITAL LAB): SARS Coronavirus 2: NEGATIVE

## 2019-04-17 SURGERY — ESOPHAGOGASTRODUODENOSCOPY (EGD) WITH PROPOFOL
Anesthesia: General

## 2019-04-17 MED ORDER — SODIUM CHLORIDE 0.9 % IV SOLN
INTRAVENOUS | Status: DC | PRN
  Administered 2019-04-17: 50 ug/min via INTRAVENOUS

## 2019-04-17 MED ORDER — ONDANSETRON HCL 4 MG/2ML IJ SOLN
4.0000 mg | Freq: Once | INTRAMUSCULAR | Status: AC
  Administered 2019-04-17: 4 mg via INTRAVENOUS
  Filled 2019-04-17: qty 2

## 2019-04-17 MED ORDER — PANTOPRAZOLE SODIUM 40 MG IV SOLR
40.0000 mg | Freq: Once | INTRAVENOUS | Status: AC
  Administered 2019-04-17: 40 mg via INTRAVENOUS
  Filled 2019-04-17: qty 40

## 2019-04-17 MED ORDER — CARVEDILOL 6.25 MG PO TABS
6.2500 mg | ORAL_TABLET | Freq: Two times a day (BID) | ORAL | Status: DC
  Administered 2019-04-18 – 2019-04-21 (×8): 6.25 mg via ORAL
  Filled 2019-04-17 (×8): qty 1

## 2019-04-17 MED ORDER — OXYCODONE-ACETAMINOPHEN 5-325 MG PO TABS
1.0000 | ORAL_TABLET | Freq: Four times a day (QID) | ORAL | Status: DC | PRN
  Administered 2019-04-18 – 2019-04-21 (×12): 1 via ORAL
  Filled 2019-04-17 (×12): qty 1

## 2019-04-17 MED ORDER — SIMVASTATIN 20 MG PO TABS
20.0000 mg | ORAL_TABLET | Freq: Every evening | ORAL | Status: DC
  Administered 2019-04-18 – 2019-04-20 (×3): 20 mg via ORAL
  Filled 2019-04-17 (×3): qty 1

## 2019-04-17 MED ORDER — ONDANSETRON HCL 4 MG/2ML IJ SOLN: 4.0000 mg | Freq: Four times a day (QID) | INTRAMUSCULAR | Status: DC | PRN

## 2019-04-17 MED ORDER — ACETAMINOPHEN 650 MG RE SUPP: 650.0000 mg | Freq: Four times a day (QID) | RECTAL | Status: DC | PRN

## 2019-04-17 MED ORDER — METOPROLOL TARTRATE 5 MG/5ML IV SOLN
5.0000 mg | Freq: Once | INTRAVENOUS | Status: AC
  Administered 2019-04-17: 22:00:00 5 mg via INTRAVENOUS

## 2019-04-17 MED ORDER — GLUCAGON HCL RDNA (DIAGNOSTIC) 1 MG IJ SOLR
1.0000 mg | Freq: Once | INTRAMUSCULAR | Status: AC
  Administered 2019-04-17: 1 mg via INTRAVENOUS
  Filled 2019-04-17: qty 1

## 2019-04-17 MED ORDER — LIDOCAINE 2% (20 MG/ML) 5 ML SYRINGE
INTRAMUSCULAR | Status: DC | PRN
  Administered 2019-04-17: 50 mg via INTRAVENOUS

## 2019-04-17 MED ORDER — INSULIN ASPART 100 UNIT/ML ~~LOC~~ SOLN
0.0000 [IU] | Freq: Three times a day (TID) | SUBCUTANEOUS | Status: DC
  Administered 2019-04-18 – 2019-04-19 (×4): 2 [IU] via SUBCUTANEOUS
  Administered 2019-04-19: 5 [IU] via SUBCUTANEOUS
  Administered 2019-04-20: 2 [IU] via SUBCUTANEOUS
  Administered 2019-04-20 – 2019-04-21 (×2): 3 [IU] via SUBCUTANEOUS
  Administered 2019-04-21: 2 [IU] via SUBCUTANEOUS

## 2019-04-17 MED ORDER — ONDANSETRON HCL 4 MG/2ML IJ SOLN
INTRAMUSCULAR | Status: AC
  Administered 2019-04-17: 2 mg via INTRAVENOUS
  Filled 2019-04-17: qty 2

## 2019-04-17 MED ORDER — METOPROLOL TARTRATE 5 MG/5ML IV SOLN
INTRAVENOUS | Status: AC
  Administered 2019-04-17: 5 mg via INTRAVENOUS
  Filled 2019-04-17: qty 5

## 2019-04-17 MED ORDER — OXYCODONE-ACETAMINOPHEN 10-325 MG PO TABS: 1.0000 | ORAL_TABLET | Freq: Four times a day (QID) | ORAL | Status: DC | PRN

## 2019-04-17 MED ORDER — ONDANSETRON HCL 4 MG PO TABS: 4.0000 mg | ORAL_TABLET | Freq: Four times a day (QID) | ORAL | Status: DC | PRN

## 2019-04-17 MED ORDER — OXYCODONE HCL 5 MG PO TABS
5.0000 mg | ORAL_TABLET | Freq: Four times a day (QID) | ORAL | Status: DC | PRN
  Administered 2019-04-18 – 2019-04-21 (×10): 5 mg via ORAL
  Filled 2019-04-17 (×10): qty 1

## 2019-04-17 MED ORDER — ONDANSETRON HCL 4 MG/2ML IJ SOLN
2.0000 mg | Freq: Once | INTRAMUSCULAR | Status: AC | PRN
  Administered 2019-04-17: 22:00:00 2 mg via INTRAVENOUS

## 2019-04-17 MED ORDER — SUCCINYLCHOLINE CHLORIDE 20 MG/ML IJ SOLN
INTRAMUSCULAR | Status: DC | PRN
  Administered 2019-04-17: 100 mg via INTRAVENOUS

## 2019-04-17 MED ORDER — ACETAMINOPHEN 325 MG PO TABS: 650.0000 mg | ORAL_TABLET | Freq: Four times a day (QID) | ORAL | Status: DC | PRN

## 2019-04-17 MED ORDER — LACTATED RINGERS IV SOLN
INTRAVENOUS | Status: DC | PRN
  Administered 2019-04-17: 21:00:00 via INTRAVENOUS

## 2019-04-17 MED ORDER — BUPROPION HCL ER (XL) 150 MG PO TB24
150.0000 mg | ORAL_TABLET | Freq: Every day | ORAL | Status: DC
  Administered 2019-04-18 – 2019-04-21 (×4): 150 mg via ORAL
  Filled 2019-04-17 (×4): qty 1

## 2019-04-17 MED ORDER — PANTOPRAZOLE SODIUM 40 MG IV SOLR
40.0000 mg | Freq: Two times a day (BID) | INTRAVENOUS | Status: DC
  Administered 2019-04-18 – 2019-04-19 (×4): 40 mg via INTRAVENOUS
  Filled 2019-04-17 (×4): qty 40

## 2019-04-17 MED ORDER — PROPOFOL 10 MG/ML IV BOLUS
INTRAVENOUS | Status: DC | PRN
  Administered 2019-04-17: 90 mg via INTRAVENOUS

## 2019-04-17 MED ORDER — DULOXETINE HCL 60 MG PO CPEP
60.0000 mg | ORAL_CAPSULE | Freq: Two times a day (BID) | ORAL | Status: DC
  Administered 2019-04-18 – 2019-04-21 (×8): 60 mg via ORAL
  Filled 2019-04-17 (×8): qty 1

## 2019-04-17 MED ORDER — CLOPIDOGREL BISULFATE 75 MG PO TABS
75.0000 mg | ORAL_TABLET | Freq: Every day | ORAL | Status: DC
  Administered 2019-04-18: 75 mg via ORAL
  Filled 2019-04-17: qty 1

## 2019-04-17 SURGICAL SUPPLY — 15 items

## 2019-04-17 NOTE — Brief Op Note (Signed)
04/17/2019 Is Dr. Shirley Muscat 9:39 PM  PATIENT:  Norma [name]  85 y.o. female  PRE-OPERATIVE DIAGNOSIS:  food impaction  POST-OPERATIVE DIAGNOSIS:  EGD with foreign body removal; gastric bx for h. pylori   PROCEDURE:  Procedure(s): ESOPHAGOGASTRODUODENOSCOPY (EGD) WITH PROPOFOL (N/A) FOREIGN BODY REMOVAL  SURGEON:  Surgeon(s) and Role:    * Fate Galanti, MD - Primary  Findings ------------ -Large food bolus in the upper esophagus just below upper esophageal sphincter. - There was evidence of bleeding with minimal instrumentation.  Attempt was made to remove food bolus with Tolan grasper as well as with rat-tooth without initial success.  Several pieces of food bolus was removed with use of rat-tooth.  ENT physician was consulted and decision was made to take patient to the OR for food bolus removal by rigid esophagoscopy.  After this, I noted bolus moving downwards.  Subsequently I was able to use Talon grasper and removed small part of the food bolus.  Subsequently remaining food bolus was pushed down into the stomach without any resistance.  -  there was mild gastritis and esophagitis. -Small superficial tear noted in the proximal esophagus after the procedure.  Recommendations ------------------------ -Admit to hospital for new onset A. fib.  Discussed with hospitalist. -Okay to have clear liquid diet today.  Advance diet as tolerated tomorrow if no postoperative complications. -If anticoagulation is needed, okay to start anticoagulation after cardiology evaluation tomorrow. -Start IV twice daily PPI. -Call us back if needed .   Otis Brace MD, FACP 04/17/2019, 9:44 PM  Contact #  [phone]

## 2019-04-17 NOTE — Anesthesia Postprocedure Evaluation (Signed)
Anesthesia Post Note  Patient: ORTHA METTS  Procedure(s) Performed: ESOPHAGOGASTRODUODENOSCOPY (EGD) WITH PROPOFOL (N/A ) FOREIGN BODY REMOVAL     Patient location during evaluation: PACU Anesthesia Type: General Level of consciousness: awake and alert Pain management: pain level controlled Vital Signs Assessment: post-procedure vital signs reviewed and stable Respiratory status: spontaneous breathing, nonlabored ventilation, respiratory function stable and patient connected to nasal cannula oxygen Postop Assessment: no apparent nausea or vomiting Anesthetic complications: no Comments: New onset Afib, patient being admitted for further evaluation by cardiology.     Last Vitals:  Vitals:   04/17/19 2240 04/17/19 2241  BP: (!) 145/92   Pulse:  88  Resp: (!) 24 (!) 21  Temp:    SpO2:  97%    Last Pain:  Vitals:   04/17/19 2019  TempSrc: Temporal  PainSc: 0-No pain                 Effie Berkshire

## 2019-04-17 NOTE — Progress Notes (Signed)
Point of contact is pt's grandson,  Edythe Riches [phone]. Dr Alessandra Bevels spoke with Thedore Mins via phone to update on pt's status and admission to the hospital for observation per verbal report from MD.

## 2019-04-17 NOTE — Progress Notes (Signed)
Pt placed on hold at 2141- no answer on 4E

## 2019-04-17 NOTE — Progress Notes (Signed)
Attempt to call report no answer

## 2019-04-17 NOTE — ED Provider Notes (Signed)
[facility] EMERGENCY DEPARTMENT Provider Note   CSN: [id number] Arrival date & time: 04/17/19  1830    History   Chief Complaint No chief complaint on file.   HPI Norma [name] is a 85 y.o. female.     HPI  85 year old female presents as a transfer from [facility] with food impaction. ED had spoken to Dr. Alessandra Bevels, who has accepted. Ate steak around 1 pm, unable to swallow since. No trouble breathing. No chest/abdominal pain. Sent here for endcoscopy.  Past Medical History:  Diagnosis Date  . Acute systolic heart failure ([place])   . Anemia of other chronic disease   . Chronic kidney disease, unspecified   . Chronic pain syndrome   . Congestive heart failure, unspecified   . Coronary atherosclerosis of native coronary artery   . History of blood transfusion 1960's   "when my last child was born" (12/23/2012)  . Hypoxemia   . Other chronic pulmonary heart diseases   . Other primary cardiomyopathies   . Other specified forms of chronic ischemic heart disease   . Pneumonia    "when I was little" (12/23/2012)  . Stroke [facility]) ~ 2011   "slowed my walking" (12/23/2012)  . Type II diabetes mellitus ([place])   . Unspecified hypertensive kidney disease with chronic kidney disease stage I through stage IV, or unspecified(403.90)   . Unspecified pleural effusion     Patient Active Problem List   Diagnosis Date Noted  . Atrial fibrillation with RVR ([place]) 04/17/2019  . Complicated UTI (urinary tract infection) 07/29/2017  . Bacteremia due to Klebsiella pneumoniae 07/28/2017  . Klebsiella sepsis ([place]) 07/28/2017  . Acute pyelonephritis 07/27/2017  . Acute renal failure superimposed on stage 3 chronic kidney disease ([place]) 07/26/2017  . Uncontrolled type 2 diabetes mellitus with hyperglycemia, with long-term current use of insulin ([place]) 07/26/2017  . Acute metabolic encephalopathy 49/44/9675  . Sepsis ([place]) 06/18/2017  . Acute renal failure superimposed on stage 2  chronic kidney disease ([place]) 06/18/2017  . Abdominal pain   . Urinary tract infection without hematuria   . Lower urinary tract infectious disease 02/26/2016  . Weakness generalized 02/26/2016  . Weakness 02/26/2016  . DM2 (diabetes mellitus, type 2) ([place]) 02/26/2016  . Hemoptysis 12/24/2012  . Dyspnea on exertion 03/08/2012  . Palpitations 02/05/2012  . Pulmonary nodule 02/05/2012  . Nausea and vomiting 01/03/2011  . Dilated cardiomyopathy ([place]) 12/04/2010  . Acute on chronic congestive heart failure ([place]) 12/04/2010  . Abnormal EKG 12/04/2010  . Pulmonary hypertension ([place]) 12/04/2010  . Hypotension 12/04/2010  . Chronic kidney disease (CKD), stage III (moderate) ([place]) 12/04/2010  . CAD (coronary artery disease) 12/04/2010    Past Surgical History:  Procedure Laterality Date  . ABDOMINAL HYSTERECTOMY    . APPENDECTOMY    . CATARACT EXTRACTION, BILATERAL    . CHOLECYSTECTOMY    . DILATION AND CURETTAGE OF UTERUS    . PATELLA RECONSTRUCTION Bilateral    "had my knee caps replaced" (12/23/2012)  . TONSILLECTOMY       OB History   No obstetric history on file.      Home Medications    Prior to Admission medications   Medication Sig Start Date End Date Taking? Authorizing Provider  clopidogrel (PLAVIX) 75 MG tablet Take 75 mg by mouth daily.   Yes [provider]  buPROPion (WELLBUTRIN XL) 150 MG 24 hr tablet Take 150 mg by mouth daily.  06/01/18   [provider]  carvedilol (  COREG) 6.25 MG tablet Take 1 tablet (6.25 mg total) 2 (two) times daily with a meal by mouth. 07/29/17   Debbe Odea, MD  cephALEXin (KEFLEX) 500 MG capsule Take 1 capsule (500 mg total) by mouth 4 (four) times daily. 08/09/18   Fredia Sorrow, MD  ciprofloxacin (CIPRO) 500 MG tablet Take 1 tablet (500 mg total) daily by mouth. Patient not taking: Reported on 08/17/2017 07/30/17   Debbe Odea, MD  Cranberry 300 MG tablet Take 1 tablet (300 mg total) every other day by mouth.  07/29/17   Debbe Odea, MD  DULoxetine (CYMBALTA) 60 MG capsule Take 60 mg by mouth 2 (two) times daily.     [provider]  furosemide (LASIX) 40 MG tablet Take 40 mg by mouth daily as needed for fluid.  12/04/10   de Stanford Scotland, MD  insulin detemir (LEVEMIR) 100 UNIT/ML injection Inject 0.25 mLs (25 Units total) at bedtime into the skin. Patient taking differently: Inject 20 Units into the skin at bedtime.  07/29/17   Debbe Odea, MD  oxyCODONE-acetaminophen (PERCOCET) 10-325 MG tablet Take 1 tablet 4 (four) times daily by mouth. 07/29/17   Debbe Odea, MD  pantoprazole (PROTONIX) 40 MG tablet Take 1 tablet by mouth at bedtime.  02/11/16   [provider]  potassium chloride (K-DUR) 10 MEQ tablet Take 1 tablet by mouth daily.  02/18/16   [provider]  saccharomyces boulardii (FLORASTOR) 250 MG capsule Take 1 capsule (250 mg total) 2 (two) times daily by mouth. 07/29/17   Debbe Odea, MD  simvastatin (ZOCOR) 20 MG tablet Take 20 mg by mouth every evening.     [provider]    Family History Family History  Problem Relation Age of Onset  . Heart attack Father 39       MI  . Cancer Father        leukemia  . Cancer Brother   . Heart disease Daughter     Social History Social History   Tobacco Use  . Smoking status: Never Smoker  . Smokeless tobacco: Never Used  Substance Use Topics  . Alcohol use: No  . Drug use: No     Allergies   Patient has no known allergies.   Review of Systems Review of Systems  Respiratory: Negative for shortness of breath.   Cardiovascular: Negative for chest pain.  Gastrointestinal: Negative for abdominal pain and vomiting.  All other systems reviewed and are negative.    Physical Exam Updated Vital Signs BP 134/85   Pulse 90   Temp 98.2 F (36.8 C)   Resp 20   Ht 5\' 2"  (1.575 m)   Wt 68 kg   SpO2 97%   BMI 27.44 kg/m   Physical Exam Vitals signs and nursing note reviewed.   Constitutional:      General: She is not in acute distress.    Appearance: She is well-developed. She is not ill-appearing or diaphoretic.  HENT:     Head: Normocephalic and atraumatic.     Right Ear: External ear normal.     Left Ear: External ear normal.     Nose: Nose normal.  Eyes:     General:        Right eye: No discharge.        Left eye: No discharge.  Cardiovascular:     Rate and Rhythm: Normal rate and regular rhythm.     Heart sounds: Normal heart sounds.  Pulmonary:  Effort: Pulmonary effort is normal.     Breath sounds: Normal breath sounds.  Abdominal:     General: There is no distension.     Palpations: Abdomen is soft.     Tenderness: There is no abdominal tenderness.  Skin:    General: Skin is warm and dry.  Neurological:     Mental Status: She is alert.  Psychiatric:        Mood and Affect: Mood is not anxious.      ED Treatments / Results  Labs (all labs ordered are listed, but only abnormal results are displayed) Labs Reviewed  GLUCOSE, CAPILLARY - Abnormal; Notable for the following components:      Result Value   Glucose-Capillary 191 (*)    All other components within normal limits  POCT I-STAT 4, (NA,K, GLUC, HGB,HCT) - Abnormal; Notable for the following components:   Glucose, Bld 191 (*)    All other components within normal limits  SARS CORONAVIRUS 2 (HOSPITAL ORDER, [facility])  SURGICAL PATHOLOGY    EKG None  Radiology No results found.  Procedures Procedures (including critical care time)  Medications Ordered in ED Medications  pantoprazole (PROTONIX) injection 40 mg (has no administration in time range)  pantoprazole (PROTONIX) injection 40 mg (40 mg Intravenous Given 04/17/19 1917)  glucagon (human recombinant) (GLUCAGEN) injection 1 mg (1 mg Intravenous Given 04/17/19 1911)  ondansetron (ZOFRAN) injection 4 mg (4 mg Intravenous Given 04/17/19 1926)  ondansetron (ZOFRAN) injection 2 mg (2 mg  Intravenous Given 04/17/19 2224)  metoprolol tartrate (LOPRESSOR) injection 5 mg (5 mg Intravenous Given 04/17/19 2226)     Initial Impression / Assessment and Plan / ED Course  I have reviewed the triage vital signs and the nursing notes.  Pertinent labs & imaging results that were available during my care of the patient were reviewed by me and considered in my medical decision making (see chart for details).  Clinical Course as of Apr 17 2315  Nancy Fetter Apr 17, 2019  1849 Discussed with GI. Covid is pending. He asks for dose of IV protonix (40 mg) and dose of glucagon while we're waiting. Otherwise he will come in no relief.   [SG]    Clinical Course User Index [SG] Sherwood Gambler, MD       Patient started vomiting after glucagon.  Given she still has the impaction, GI will take to endoscopy.  Norma [name] was evaluated in Emergency Department on 04/17/2019 for the symptoms described in the history of present illness. She was evaluated in the context of the global COVID-19 pandemic, which necessitated consideration that the patient might be at risk for infection with the SARS-CoV-2 virus that causes COVID-19. Institutional protocols and algorithms that pertain to the evaluation of patients at risk for COVID-19 are in a state of rapid change based on information released by regulatory bodies including the CDC and federal and state organizations. These policies and algorithms were followed during the patient's care in the ED.   Final Clinical Impressions(s) / ED Diagnoses   Final diagnoses:  Food impaction of esophagus, initial encounter    ED Discharge Orders    None       Sherwood Gambler, MD 04/17/19 2317

## 2019-04-17 NOTE — Op Note (Addendum)
[facility] Patient Name: Norma [name] Procedure Date : 04/17/2019 MRN: [id number] Attending MD: Otis Brace , MD Date of Birth: [birth year] CSN: [id number] Age: 85 Admit Type: Emergency Department Procedure:                Upper GI endoscopy Indications:              Dysphagia, Foreign body in the esophagus Providers:                Otis Brace, MD, Jeanella Cara, RN,                            Laverda Sorenson, Technician, Ivar Drape CRNA, CRNA Referring MD:              Medicines:                General Anesthesia Complications:            Tear Just below upper esophageal sphincter Estimated Blood Loss:     Estimated blood loss was minimal. Procedure:                Pre-Anesthesia Assessment:                           - Prior to the procedure, a History and Physical                            was performed, and patient medications and                            allergies were reviewed. The patient's tolerance of                            previous anesthesia was also reviewed. The risks                            and benefits of the procedure and the sedation                            options and risks were discussed with the patient.                            All questions were answered, and informed consent                            was obtained. Prior Anticoagulants: The patient has                            taken Plavix (clopidogrel), last dose was day of                            procedure. ASA Grade Assessment: III - A patient                            with severe systemic disease. After reviewing the  risks and benefits, the patient was deemed in                            satisfactory condition to undergo the procedure.                           After obtaining informed consent, the endoscope was                            passed under direct vision. Throughout the                            procedure, the  patient's blood pressure, pulse, and                            oxygen saturations were monitored continuously. The                            GIF-H190 ([id number] Olympus gastroscope was                            introduced through the mouth, and advanced to the                            second part of duodenum. The upper GI endoscopy was                            technically difficult and complex due to presence                            of food. The patient tolerated the procedure well. Scope In: Scope Out: Findings:      Food was found in the proximal esophagus just below the upper esophageal       sphincter. Removal of food was accomplished as mentioned below.      There was evidence of bleeding with minimal instrumentation. Attempt was       made to remove food bolus with Tolan grasper as well as with rat-tooth       without initial success. Several pieces of food bolus was removed with       use of rat-tooth. ENT physician was consulted and decision was made to       take patient to the OR for food bolus removal by rigid esophagoscopy.       After this, I noted bolus moving downwards. Subsequently I was able to       use Talon grasper and removed small part of the food bolus. Subsequently       remaining food bolus was pushed down into the stomach without any       resistance.      LA Grade B (one or more mucosal breaks greater than 5 mm, not extending       between the tops of two mucosal folds) esophagitis with no bleeding was       found in the distal esophagus.      Scattered mild inflammation was found in the entire examined stomach.  Biopsies were taken with a cold forceps for histology.      The cardia and gastric fundus were normal on retroflexion.      The duodenal bulb, first portion of the duodenum and second portion of       the duodenum were normal. Impression:               - Food in the proximal esophagus. Removal was                            successful.                            - LA Grade B reflux esophagitis.                           - Chronic gastritis. Biopsied.                           - Normal duodenal bulb, first portion of the                            duodenum and second portion of the duodenum. Recommendation:           - Admit the patient to hospital ward for ongoing                            care.                           - Clear liquid diet.                           - Continue present medications.                           - Use Protonix (pantoprazole) 40 mg IV BID                           - Await pathology results. Procedure Code(s):        --- Professional ---                           [phone], Esophagogastroduodenoscopy, flexible,                            transoral; with removal of foreign body(s)                           43239, Esophagogastroduodenoscopy, flexible,                            transoral; with biopsy, single or multiple Diagnosis Code(s):        --- Professional ---                           G95.621H, Food in esophagus causing other injury,  initial encounter                           K21.0, Gastro-esophageal reflux disease with                            esophagitis                           K29.50, Unspecified chronic gastritis without                            bleeding                           R13.10, Dysphagia, unspecified                           T18.108A, Unspecified foreign body in esophagus                            causing other injury, initial encounter CPT copyright 2019 [employer]. All rights reserved. The codes documented in this report are preliminary and upon coder review may  be revised to meet current compliance requirements. Otis Brace, MD Otis Brace, MD 04/17/2019 9:39:45 PM Number of Addenda: 0

## 2019-04-17 NOTE — ED Triage Notes (Signed)
Patient arrived by [facility] from [facility] for food bolus. Patient handling secretions and speaking complete sentences. States that she ate steak at 1pm and it want pass, hx of same.

## 2019-04-17 NOTE — H&P (Addendum)
History and Physical    DAVELYN GWINN FIE:[id number] DOB: 28-Feb-1934 DOA: 04/17/2019  PCP: Caryl Bis, MD  Patient coming from: Home.  Chief Complaint: Food impaction.  HPI: FIORA [name] is a 85 y.o. female with history of diabetes mellitus type 2, chronic pain, CHF, hypertension, history of stroke with right-sided weakness presented to the ER because of patient had difficulty swallowing after eating steak at lunchtime today.  Patient was brought to the endoscopic suite and over there patient was found to be in A. fib with RVR.  Patient underwent EGD and the foreign body was pushed to the stomach.  And the hospitalist was consulted for admission and management of A. fib with RVR.  At the time of my exam all labs are pending.  Discussed with gastroenterologist who advised at this time to hold off anticoagulation for next 12 hours since patient had mild bleeding from the procedure.  On my exam patient denies any chest pain shortness of breath nausea vomiting palpitations.  Patient states she has never had A. fib before.  Monitor shows A. fib with RVR and was given 2 dose of IV metoprolol 5 mg.  At this time the heart rate is around 100 bpm.  ED Course: Patient was a direct admit from the endoscopy excluded.  Review of Systems: As per HPI, rest all negative.   Past Medical History:  Diagnosis Date  . Acute systolic heart failure ([place])   . Anemia of other chronic disease   . Chronic kidney disease, unspecified   . Chronic pain syndrome   . Congestive heart failure, unspecified   . Coronary atherosclerosis of native coronary artery   . History of blood transfusion 1960's   "when my last child was born" (12/23/2012)  . Hypoxemia   . Other chronic pulmonary heart diseases   . Other primary cardiomyopathies   . Other specified forms of chronic ischemic heart disease   . Pneumonia    "when I was little" (12/23/2012)  . Stroke [facility]) ~ 2011   "slowed my walking" (12/23/2012)  . Type  II diabetes mellitus ([place])   . Unspecified hypertensive kidney disease with chronic kidney disease stage I through stage IV, or unspecified(403.90)   . Unspecified pleural effusion     Past Surgical History:  Procedure Laterality Date  . ABDOMINAL HYSTERECTOMY    . APPENDECTOMY    . CATARACT EXTRACTION, BILATERAL    . CHOLECYSTECTOMY    . DILATION AND CURETTAGE OF UTERUS    . PATELLA RECONSTRUCTION Bilateral    "had my knee caps replaced" (12/23/2012)  . TONSILLECTOMY       reports that she has never smoked. She has never used smokeless tobacco. She reports that she does not drink alcohol or use drugs.  No Known Allergies  Family History  Problem Relation Age of Onset  . Heart attack Father 45       MI  . Cancer Father        leukemia  . Cancer Brother   . Heart disease Daughter     Prior to Admission medications   Medication Sig Start Date End Date Taking? Authorizing Provider  clopidogrel (PLAVIX) 75 MG tablet Take 75 mg by mouth daily.   Yes [provider]  buPROPion (WELLBUTRIN XL) 150 MG 24 hr tablet Take 150 mg by mouth daily.  06/01/18   [provider]  carvedilol (COREG) 6.25 MG tablet Take 1 tablet (6.25 mg total) 2 (two) times daily with  a meal by mouth. 07/29/17   Debbe Odea, MD  cephALEXin (KEFLEX) 500 MG capsule Take 1 capsule (500 mg total) by mouth 4 (four) times daily. 08/09/18   Fredia Sorrow, MD  ciprofloxacin (CIPRO) 500 MG tablet Take 1 tablet (500 mg total) daily by mouth. Patient not taking: Reported on 08/17/2017 07/30/17   Debbe Odea, MD  Cranberry 300 MG tablet Take 1 tablet (300 mg total) every other day by mouth. 07/29/17   Debbe Odea, MD  DULoxetine (CYMBALTA) 60 MG capsule Take 60 mg by mouth 2 (two) times daily.     [provider]  furosemide (LASIX) 40 MG tablet Take 40 mg by mouth daily as needed for fluid.  12/04/10   de Stanford Scotland, MD  insulin detemir (LEVEMIR) 100 UNIT/ML injection Inject 0.25 mLs (25  Units total) at bedtime into the skin. Patient taking differently: Inject 20 Units into the skin at bedtime.  07/29/17   Debbe Odea, MD  oxyCODONE-acetaminophen (PERCOCET) 10-325 MG tablet Take 1 tablet 4 (four) times daily by mouth. 07/29/17   Debbe Odea, MD  pantoprazole (PROTONIX) 40 MG tablet Take 1 tablet by mouth at bedtime.  02/11/16   [provider]  potassium chloride (K-DUR) 10 MEQ tablet Take 1 tablet by mouth daily.  02/18/16   [provider]  saccharomyces boulardii (FLORASTOR) 250 MG capsule Take 1 capsule (250 mg total) 2 (two) times daily by mouth. 07/29/17   Debbe Odea, MD  simvastatin (ZOCOR) 20 MG tablet Take 20 mg by mouth every evening.     [provider]    Physical Exam: Constitutional: Moderately built and nourished. Vitals:   04/17/19 2240 04/17/19 2241 04/17/19 2310 04/17/19 2320  BP: (!) 145/92  134/85 125/76  Pulse:  88 90 78  Resp: (!) 24 (!) 21 20 (!) 23  Temp: 98.2 F (36.8 C)   98.4 F (36.9 C)  TempSrc:    Oral  SpO2:  97% 97% 99%  Weight:      Height:       Eyes: Anicteric no pallor. ENMT: No discharge from the ears eyes nose or mouth. Neck: No mass felt.  No neck rigidity. Respiratory: No rhonchi or crepitations. Cardiovascular: S1-S2 heard. Abdomen: Soft nontender bowel sounds present. Musculoskeletal: No edema. Skin: No rash. Neurologic: Alert awake oriented to time place and person.  Mild weakness of the right side. Psychiatric: Appears normal per normal affect.   Labs on Admission: I have personally reviewed following labs and imaging studies  CBC: Recent Labs  Lab 04/17/19 2039  HGB 12.9  HCT 35.3   Basic Metabolic Panel: Recent Labs  Lab 04/17/19 2039  NA 141  K 4.6  GLUCOSE 191*   GFR: CrCl cannot be calculated (Patient's most recent lab result is older than the maximum 21 days allowed.). Liver Function Tests: No results for input(s): AST, ALT, ALKPHOS, BILITOT, PROT, ALBUMIN in the  last 168 hours. No results for input(s): LIPASE, AMYLASE in the last 168 hours. No results for input(s): AMMONIA in the last 168 hours. Coagulation Profile: No results for input(s): INR, PROTIME in the last 168 hours. Cardiac Enzymes: No results for input(s): CKTOTAL, CKMB, CKMBINDEX, TROPONINI in the last 168 hours. BNP (last 3 results) No results for input(s): PROBNP in the last 8760 hours. HbA1C: No results for input(s): HGBA1C in the last 72 hours. CBG: Recent Labs  Lab 04/17/19 2142  GLUCAP 191*   Lipid Profile: No results for input(s): CHOL, HDL, LDLCALC,  TRIG, CHOLHDL, LDLDIRECT in the last 72 hours. Thyroid Function Tests: No results for input(s): TSH, T4TOTAL, FREET4, T3FREE, THYROIDAB in the last 72 hours. Anemia Panel: No results for input(s): VITAMINB12, FOLATE, FERRITIN, TIBC, IRON, RETICCTPCT in the last 72 hours. Urine analysis:    Component Value Date/Time   COLORURINE AMBER (A) 08/09/2018 1146   APPEARANCEUR CLOUDY (A) 08/09/2018 1146   LABSPEC 1.016 08/09/2018 1146   PHURINE 6.0 08/09/2018 1146   GLUCOSEU NEGATIVE 08/09/2018 1146   HGBUR LARGE (A) 08/09/2018 1146   BILIRUBINUR NEGATIVE 08/09/2018 1146   KETONESUR NEGATIVE 08/09/2018 1146   PROTEINUR 30 (A) 08/09/2018 1146   UROBILINOGEN 1.0 04/25/2008 1210   NITRITE POSITIVE (A) 08/09/2018 1146   LEUKOCYTESUR TRACE (A) 08/09/2018 1146   Sepsis Labs: @LABRCNTIP (procalcitonin:4,lacticidven:4) ) Recent Results (from the past 240 hour(s))  SARS Coronavirus 2 [facility] order, Performed in [facility] hospital lab) Nasopharyngeal Nasopharyngeal Swab     Status: None   Collection Time: 04/17/19  6:43 PM   Specimen: Nasopharyngeal Swab  Result Value Ref Range Status   SARS Coronavirus 2 NEGATIVE NEGATIVE Final    Comment: (NOTE) If result is NEGATIVE SARS-CoV-2 target nucleic acids are NOT DETECTED. The SARS-CoV-2 RNA is generally detectable in upper and lower  respiratory specimens during the acute phase  of infection. The lowest  concentration of SARS-CoV-2 viral copies this assay can detect is 250  copies / mL. A negative result does not preclude SARS-CoV-2 infection  and should not be used as the sole basis for treatment or other  patient management decisions.  A negative result may occur with  improper specimen collection / handling, submission of specimen other  than nasopharyngeal swab, presence of viral mutation(s) within the  areas targeted by this assay, and inadequate number of viral copies  (<250 copies / mL). A negative result must be combined with clinical  observations, patient history, and epidemiological information. If result is POSITIVE SARS-CoV-2 target nucleic acids are DETECTED. The SARS-CoV-2 RNA is generally detectable in upper and lower  respiratory specimens dur ing the acute phase of infection.  Positive  results are indicative of active infection with SARS-CoV-2.  Clinical  correlation with patient history and other diagnostic information is  necessary to determine patient infection status.  Positive results do  not rule out bacterial infection or co-infection with other viruses. If result is PRESUMPTIVE POSTIVE SARS-CoV-2 nucleic acids MAY BE PRESENT.   A presumptive positive result was obtained on the submitted specimen  and confirmed on repeat testing.  While 2019 novel coronavirus  (SARS-CoV-2) nucleic acids may be present in the submitted sample  additional confirmatory testing may be necessary for epidemiological  and / or clinical management purposes  to differentiate between  SARS-CoV-2 and other Sarbecovirus currently known to infect humans.  If clinically indicated additional testing with an alternate test  methodology [phone]) is advised. The SARS-CoV-2 RNA is generally  detectable in upper and lower respiratory sp ecimens during the acute  phase of infection. The expected result is Negative. Fact Sheet for Patients:   StrictlyIdeas.no Fact Sheet for Healthcare Providers: BankingDealers.co.za This test is not yet approved or cleared by the [place] FDA and has been authorized for detection and/or diagnosis of SARS-CoV-2 by FDA under an Emergency Use Authorization (EUA).  This EUA will remain in effect (meaning this test can be used) for the duration of the COVID-19 declaration under Section 564(b)(1) of the Act, 21 U.S.C. section 360bbb-3(b)(1), unless the authorization is terminated or revoked  sooner. Performed at [facility], [place] [address], [place], [place] [address]      Radiological Exams on Admission: No results found.   Assessment/Plan Principal Problem:   Atrial fibrillation with RVR (HCC) Active Problems:   Dilated cardiomyopathy (HCC)   Pulmonary hypertension (HCC)   Chronic kidney disease (CKD), stage III (moderate) (HCC)   CAD (coronary artery disease)   Uncontrolled type 2 diabetes mellitus with hyperglycemia, with long-term current use of insulin (HCC)   History of completed stroke   Food impaction of esophagus    1. A. fib with RVR likely new onset.  Patient denies any chest pain palpitation or shortness of breath.  Has had previous history of stroke and is on Plavix.  Discussed with gastroenterologist Dr. Patricia Nettle who at this time advised to hold of anticoagulation for at least 12 hours given the recent procedure for food impaction and mild bleeding.  I have placed patient on Coreg which patient takes at home.  If heart rate does not improve may need Cardizem infusion.  Will check cardiac markers TSH and patient's basic metabolic panel and CBC are pending.  Patient's chads 2 vasc score is 8. 2. Food impaction status post removal by gastroenterologist.  On IV Protonix and clear liquid diet. 3. History of stroke on Plavix and statins. 4. History of hypertension on Coreg. 5. History of CHF appears compensated last EF  measured was 60 to 65% in November 2018. 6. Diabetes mellitus type 2 on sliding scale coverage. 7. History of depression on bupropion and Cymbalta. 8. History of chronic pain on Percocet.  All labs chest x-ray EKG are pending.   DVT prophylaxis: SCDs. Code Status: Full code this is a reversal from previous as confirmed with patient. Family Communication: Updated patient's granddaughter. Disposition Plan: Home. Consults called: Cardiology.  Gastroenterology. Admission status: Observation.   Rise Patience MD [facility] Pager [phone].  If 7PM-7AM, please contact night-coverage [email or web address] Password [facility]  04/17/2019, 11:49 PM

## 2019-04-17 NOTE — Transfer of Care (Signed)
Immediate Anesthesia Transfer of Care Note  Patient: IDAMAE COCCIA  Procedure(s) Performed: ESOPHAGOGASTRODUODENOSCOPY (EGD) WITH PROPOFOL (N/A ) FOREIGN BODY REMOVAL  Patient Location: PACU  Anesthesia Type:General  Level of Consciousness: drowsy  Airway & Oxygen Therapy: Patient Spontanous Breathing and Patient connected to nasal cannula oxygen  Post-op Assessment: Report given to RN and Post -op Vital signs reviewed and stable  Post vital signs: Reviewed and stable  Last Vitals:  Vitals Value Taken Time  BP 119/92 04/17/19 2141  Temp    Pulse 112 04/17/19 2145  Resp 24 04/17/19 2145  SpO2 100 % 04/17/19 2145  Vitals shown include unvalidated device data.  Last Pain:  Vitals:   04/17/19 2019  TempSrc: Temporal  PainSc: 0-No pain         Complications: No apparent anesthesia complications

## 2019-04-17 NOTE — ED Notes (Signed)
covid swab to lab.

## 2019-04-17 NOTE — Anesthesia Preprocedure Evaluation (Addendum)
Anesthesia Evaluation  Patient identified by MRN, date of birth, ID band Patient awake    Reviewed: Allergy & Precautions, NPO status , Patient's Chart, lab work & pertinent test results, reviewed documented beta blocker date and time   Airway Mallampati: I  TM Distance: <3 FB Neck ROM: Full    Dental  (+) Edentulous Upper, Edentulous Lower   Pulmonary    breath sounds clear to auscultation       Cardiovascular + CAD and +CHF   Rhythm:Irregular Rate:Normal     Neuro/Psych CVA    GI/Hepatic Neg liver ROS, GERD  Medicated,  Endo/Other  diabetes, Type 2, Insulin Dependent  Renal/GU      Musculoskeletal negative musculoskeletal ROS (+)   Abdominal Normal abdominal exam  (+)   Peds  Hematology negative hematology ROS (+)   Anesthesia Other Findings ? New onset A-Fib  I-Stat in Pre-op Na 141 K 4.6 H/H 12.9/38Gluc 191  Reproductive/Obstetrics                          Echo: - Left ventricle: The cavity size was normal. Wall thickness was   increased in a pattern of mild LVH. Systolic function was normal.   The estimated ejection fraction was in the range of 60% to 65%.   Wall motion was normal; there were no regional wall motion   abnormalities. Diastolic dysfunction, grade indeterminate.   Doppler parameters are consistent with high ventricular filling   pressure. - Mitral valve: There was mild regurgitation. - Tricuspid valve: There was mild regurgitation.  Anesthesia Physical Anesthesia Plan  ASA: III and emergent  Anesthesia Plan: General   Post-op Pain Management:    Induction: Intravenous, Rapid sequence and Cricoid pressure planned  PONV Risk Score and Plan: 3 and Ondansetron and Treatment may vary due to age or medical condition  Airway Management Planned: Oral ETT  Additional Equipment: None  Intra-op Plan:   Post-operative Plan: Extubation in OR  Informed Consent: I  have reviewed the patients History and Physical, chart, labs and discussed the procedure including the risks, benefits and alternatives for the proposed anesthesia with the patient or authorized representative who has indicated his/her understanding and acceptance.       Plan Discussed with: CRNA  Anesthesia Plan Comments: (istat in Endo)       Anesthesia Quick Evaluation

## 2019-04-17 NOTE — ED Notes (Signed)
Pt has had swallowing problems previously but this time she could not vomit the meat up alert oriented skin warm and dry

## 2019-04-17 NOTE — Consult Note (Signed)
Referring Provider:  EDP Primary Care Physician:  Caryl Bis, MD Primary Gastroenterologist: Althia Forts  Reason for Consultation: Food impaction  HPI: Norma [name] is a 85 y.o. female with past medical history of coronary artery disease currently on Plavix, history of CHF and diabetes initially presented [facility] him for possible food impaction.  She was eating a piece of steak when he got stuck in esophagus.  Not able to eat or drink since then.  Not able to tolerate her own saliva.  She denies abdominal pain, diarrhea or constipation.  Denies any blood in the stool or black stool.  While in the preop, she was found to have a new onset A. fib.  History of food impaction in the past which resolved on its own.  No recent EGD.  Last dose of Plavix this morning.  Past Medical History:  Diagnosis Date  . Acute systolic heart failure ([place])   . Anemia of other chronic disease   . Chronic kidney disease, unspecified   . Chronic pain syndrome   . Congestive heart failure, unspecified   . Coronary atherosclerosis of native coronary artery   . History of blood transfusion 1960's   "when my last child was born" (12/23/2012)  . Hypoxemia   . Other chronic pulmonary heart diseases   . Other primary cardiomyopathies   . Other specified forms of chronic ischemic heart disease   . Pneumonia    "when I was little" (12/23/2012)  . Stroke [facility]) ~ 2011   "slowed my walking" (12/23/2012)  . Type II diabetes mellitus ([place])   . Unspecified hypertensive kidney disease with chronic kidney disease stage I through stage IV, or unspecified(403.90)   . Unspecified pleural effusion     Past Surgical History:  Procedure Laterality Date  . ABDOMINAL HYSTERECTOMY    . APPENDECTOMY    . CATARACT EXTRACTION, BILATERAL    . CHOLECYSTECTOMY    . DILATION AND CURETTAGE OF UTERUS    . PATELLA RECONSTRUCTION Bilateral    "had my knee caps replaced" (12/23/2012)  . TONSILLECTOMY      Prior to  Admission medications   Medication Sig Start Date End Date Taking? Authorizing Provider  clopidogrel (PLAVIX) 75 MG tablet Take 75 mg by mouth daily.   Yes [provider]  buPROPion (WELLBUTRIN XL) 150 MG 24 hr tablet Take 150 mg by mouth daily.  06/01/18   [provider]  carvedilol (COREG) 6.25 MG tablet Take 1 tablet (6.25 mg total) 2 (two) times daily with a meal by mouth. 07/29/17   Debbe Odea, MD  cephALEXin (KEFLEX) 500 MG capsule Take 1 capsule (500 mg total) by mouth 4 (four) times daily. 08/09/18   Fredia Sorrow, MD  ciprofloxacin (CIPRO) 500 MG tablet Take 1 tablet (500 mg total) daily by mouth. Patient not taking: Reported on 08/17/2017 07/30/17   Debbe Odea, MD  Cranberry 300 MG tablet Take 1 tablet (300 mg total) every other day by mouth. 07/29/17   Debbe Odea, MD  DULoxetine (CYMBALTA) 60 MG capsule Take 60 mg by mouth 2 (two) times daily.     [provider]  furosemide (LASIX) 40 MG tablet Take 40 mg by mouth daily as needed for fluid.  12/04/10   de Stanford Scotland, MD  insulin detemir (LEVEMIR) 100 UNIT/ML injection Inject 0.25 mLs (25 Units total) at bedtime into the skin. Patient taking differently: Inject 20 Units into the skin at bedtime.  07/29/17   Debbe Odea, MD  oxyCODONE-acetaminophen (PERCOCET) 10-325 MG tablet Take 1 tablet 4 (four) times daily by mouth. 07/29/17   Debbe Odea, MD  pantoprazole (PROTONIX) 40 MG tablet Take 1 tablet by mouth at bedtime.  02/11/16   [provider]  potassium chloride (K-DUR) 10 MEQ tablet Take 1 tablet by mouth daily.  02/18/16   [provider]  saccharomyces boulardii (FLORASTOR) 250 MG capsule Take 1 capsule (250 mg total) 2 (two) times daily by mouth. 07/29/17   Debbe Odea, MD  simvastatin (ZOCOR) 20 MG tablet Take 20 mg by mouth every evening.     [provider]    Scheduled Meds: Continuous Infusions: PRN Meds:.  Allergies as of 04/17/2019  . (No Known  Allergies)    Family History  Problem Relation Age of Onset  . Heart attack Father 35       MI  . Cancer Father        leukemia  . Cancer Brother   . Heart disease Daughter     Social History   Socioeconomic History  . Marital status: Married    Spouse name: Not on file  . Number of children: Not on file  . Years of education: Not on file  . Highest education level: Not on file  Occupational History  . Occupation: RETIRED  Social Needs  . Financial resource strain: Not on file  . Food insecurity    Worry: Not on file    Inability: Not on file  . Transportation needs    Medical: Not on file    Non-medical: Not on file  Tobacco Use  . Smoking status: Never Smoker  . Smokeless tobacco: Never Used  Substance and Sexual Activity  . Alcohol use: No  . Drug use: No  . Sexual activity: Never  Lifestyle  . Physical activity    Days per week: Not on file    Minutes per session: Not on file  . Stress: Not on file  Relationships  . Social Herbalist on phone: Not on file    Gets together: Not on file    Attends religious service: Not on file    Active member of club or organization: Not on file    Attends meetings of clubs or organizations: Not on file    Relationship status: Not on file  . Intimate partner violence    Fear of current or ex partner: Not on file    Emotionally abused: Not on file    Physically abused: Not on file    Forced sexual activity: Not on file  Other Topics Concern  . Not on file  Social History Narrative  . Not on file    Review of Systems: Review of Systems  Constitutional: Negative for chills and fever.  HENT: Negative for hearing loss and tinnitus.   Eyes: Negative for blurred vision and double vision.  Respiratory: Negative for cough and hemoptysis.   Cardiovascular: Positive for palpitations. Negative for chest pain.  Gastrointestinal: Positive for heartburn, nausea and vomiting. Negative for abdominal pain, blood in  stool, constipation and melena.  Genitourinary: Negative for dysuria and urgency.  Musculoskeletal: Positive for joint pain and myalgias.  Skin: Negative for itching and rash.  Neurological: Negative for seizures and loss of consciousness.  Endo/Heme/Allergies: Does not bruise/bleed easily.  Psychiatric/Behavioral: Negative for hallucinations and suicidal ideas.    Physical Exam: Vital signs: Vitals:   04/17/19 1915 04/17/19 2019  BP: (!) 141/80 115/76  Pulse: 87 99  Resp:  20  Temp:  97.6 F (36.4 C)  SpO2: 95% 99%     Physical Exam  Constitutional: She is oriented to person, place, and time. She appears well-developed and well-nourished. No distress.  HENT:  Head: Normocephalic and atraumatic.  Mouth/Throat: No oropharyngeal exudate.  Eyes: EOM are normal. No scleral icterus.  Neck: Normal range of motion. Neck supple.  Cardiovascular:  Irregularly irregular rate with tachycardia  Pulmonary/Chest: Effort normal and breath sounds normal. No respiratory distress.  Anterior exam only  Abdominal: Soft. Bowel sounds are normal. She exhibits no distension. There is no abdominal tenderness. There is no rebound and no guarding.  Musculoskeletal: Normal range of motion.        General: No edema.  Neurological: She is alert and oriented to person, place, and time.  Skin: Skin is warm. No erythema.  Psychiatric: She has a normal mood and affect. Judgment normal.   GI:  Lab Results: No results for input(s): WBC, HGB, HCT, PLT in the last 72 hours. BMET No results for input(s): NA, K, CL, CO2, GLUCOSE, BUN, CREATININE, CALCIUM in the last 72 hours. LFT No results for input(s): PROT, ALBUMIN, AST, ALT, ALKPHOS, BILITOT, BILIDIR, IBILI in the last 72 hours. PT/INR No results for input(s): LABPROT, INR in the last 72 hours.   Studies/Results: No results found.  Impression/Plan: -Food impaction -History of dysphagia -Coronary artery disease.  Currently on Plavix -New onset  A. Fib.  Recommendations ------------------------- -Urgent EGD today for food impaction. -Anesthesia physician recommending admission for evaluation of new onset A. Fib -Need for endoscopy as well as need for admission discussed with patient's grandson Thedore Mins over the phone.  He verbalized understanding. -Trial hospitalist has been paged.  Risks (bleeding, infection, bowel perforation that could require surgery, sedation-related changes in cardiopulmonary systems), benefits (identification and possible treatment of source of symptoms, exclusion of certain causes of symptoms), and alternatives (watchful waiting, radiographic imaging studies, empiric medical treatment)  were explained to patient  in detail and patient wishes to proceed.  -High risk for bleeding because of use of Plavix also discussed.   LOS: 0 days   Otis Brace  MD, FACP 04/17/2019, 8:31 PM  Contact #  [phone]

## 2019-04-17 NOTE — ED Notes (Signed)
PT VOMITING AFTER THE PROTONIX  ZOFRAN GIVEN  NO STEAK VOMITED BACK  LIQUID ONLY

## 2019-04-17 NOTE — Anesthesia Procedure Notes (Signed)
Procedure Name: Intubation Date/Time: 04/17/2019 8:47 PM Performed by: Suzy Bouchard, CRNA Pre-anesthesia Checklist: Patient identified, Emergency Drugs available, Suction available, Patient being monitored and Timeout performed Patient Re-evaluated:Patient Re-evaluated prior to induction Oxygen Delivery Method: Circle system utilized Preoxygenation: Pre-oxygenation with 100% oxygen Induction Type: IV induction, Rapid sequence and Cricoid Pressure applied Laryngoscope Size: Miller and 2 Grade View: Grade I Tube type: Oral Tube size: 7.0 mm Number of attempts: 1 Airway Equipment and Method: Stylet Placement Confirmation: ETT inserted through vocal cords under direct vision,  positive ETCO2 and breath sounds checked- equal and bilateral Secured at: 22 cm Tube secured with: Tape Dental Injury: Teeth and Oropharynx as per pre-operative assessment

## 2019-04-18 ENCOUNTER — Encounter (HOSPITAL_COMMUNITY): Payer: Self-pay | Admitting: Physician Assistant

## 2019-04-18 ENCOUNTER — Ambulatory Visit (HOSPITAL_COMMUNITY): Payer: Medicare HMO

## 2019-04-18 DIAGNOSIS — I420 Dilated cardiomyopathy: Secondary | ICD-10-CM | POA: Diagnosis present

## 2019-04-18 DIAGNOSIS — Z20828 Contact with and (suspected) exposure to other viral communicable diseases: Secondary | ICD-10-CM | POA: Diagnosis present

## 2019-04-18 DIAGNOSIS — I2510 Atherosclerotic heart disease of native coronary artery without angina pectoris: Secondary | ICD-10-CM

## 2019-04-18 DIAGNOSIS — I5032 Chronic diastolic (congestive) heart failure: Secondary | ICD-10-CM | POA: Diagnosis not present

## 2019-04-18 DIAGNOSIS — T18128A Food in esophagus causing other injury, initial encounter: Secondary | ICD-10-CM | POA: Diagnosis present

## 2019-04-18 DIAGNOSIS — I5042 Chronic combined systolic (congestive) and diastolic (congestive) heart failure: Secondary | ICD-10-CM | POA: Diagnosis present

## 2019-04-18 DIAGNOSIS — I69351 Hemiplegia and hemiparesis following cerebral infarction affecting right dominant side: Secondary | ICD-10-CM | POA: Diagnosis not present

## 2019-04-18 DIAGNOSIS — R05 Cough: Secondary | ICD-10-CM | POA: Diagnosis not present

## 2019-04-18 DIAGNOSIS — E1122 Type 2 diabetes mellitus with diabetic chronic kidney disease: Secondary | ICD-10-CM | POA: Diagnosis present

## 2019-04-18 DIAGNOSIS — N183 Chronic kidney disease, stage 3 (moderate): Secondary | ICD-10-CM

## 2019-04-18 DIAGNOSIS — Z794 Long term (current) use of insulin: Secondary | ICD-10-CM | POA: Diagnosis not present

## 2019-04-18 DIAGNOSIS — I361 Nonrheumatic tricuspid (valve) insufficiency: Secondary | ICD-10-CM | POA: Diagnosis not present

## 2019-04-18 DIAGNOSIS — I4819 Other persistent atrial fibrillation: Secondary | ICD-10-CM | POA: Diagnosis present

## 2019-04-18 DIAGNOSIS — Z8249 Family history of ischemic heart disease and other diseases of the circulatory system: Secondary | ICD-10-CM | POA: Diagnosis not present

## 2019-04-18 DIAGNOSIS — Z806 Family history of leukemia: Secondary | ICD-10-CM | POA: Diagnosis not present

## 2019-04-18 DIAGNOSIS — N184 Chronic kidney disease, stage 4 (severe): Secondary | ICD-10-CM | POA: Diagnosis present

## 2019-04-18 DIAGNOSIS — I272 Pulmonary hypertension, unspecified: Secondary | ICD-10-CM | POA: Diagnosis present

## 2019-04-18 DIAGNOSIS — G894 Chronic pain syndrome: Secondary | ICD-10-CM | POA: Diagnosis present

## 2019-04-18 DIAGNOSIS — R1314 Dysphagia, pharyngoesophageal phase: Secondary | ICD-10-CM | POA: Diagnosis present

## 2019-04-18 DIAGNOSIS — I4891 Unspecified atrial fibrillation: Secondary | ICD-10-CM | POA: Diagnosis not present

## 2019-04-18 DIAGNOSIS — Z79891 Long term (current) use of opiate analgesic: Secondary | ICD-10-CM | POA: Diagnosis not present

## 2019-04-18 DIAGNOSIS — I130 Hypertensive heart and chronic kidney disease with heart failure and stage 1 through stage 4 chronic kidney disease, or unspecified chronic kidney disease: Secondary | ICD-10-CM | POA: Diagnosis present

## 2019-04-18 DIAGNOSIS — E785 Hyperlipidemia, unspecified: Secondary | ICD-10-CM | POA: Diagnosis present

## 2019-04-18 DIAGNOSIS — F329 Major depressive disorder, single episode, unspecified: Secondary | ICD-10-CM | POA: Diagnosis present

## 2019-04-18 DIAGNOSIS — K2950 Unspecified chronic gastritis without bleeding: Secondary | ICD-10-CM | POA: Diagnosis present

## 2019-04-18 DIAGNOSIS — D631 Anemia in chronic kidney disease: Secondary | ICD-10-CM | POA: Diagnosis present

## 2019-04-18 DIAGNOSIS — K210 Gastro-esophageal reflux disease with esophagitis: Secondary | ICD-10-CM | POA: Diagnosis present

## 2019-04-18 DIAGNOSIS — Z7902 Long term (current) use of antithrombotics/antiplatelets: Secondary | ICD-10-CM | POA: Diagnosis not present

## 2019-04-18 DIAGNOSIS — I5043 Acute on chronic combined systolic (congestive) and diastolic (congestive) heart failure: Secondary | ICD-10-CM | POA: Diagnosis not present

## 2019-04-18 LAB — COMPREHENSIVE METABOLIC PANEL
ALT: 60 U/L — ABNORMAL HIGH (ref 0–44)
AST: 71 U/L — ABNORMAL HIGH (ref 15–41)
Albumin: 3.3 g/dL — ABNORMAL LOW (ref 3.5–5.0)
Alkaline Phosphatase: 154 U/L — ABNORMAL HIGH (ref 38–126)
Anion gap: 9 (ref 5–15)
BUN: 25 mg/dL — ABNORMAL HIGH (ref 8–23)
CO2: 22 mmol/L (ref 22–32)
Calcium: 8.9 mg/dL (ref 8.9–10.3)
Chloride: 107 mmol/L (ref 98–111)
Creatinine, Ser: 1.39 mg/dL — ABNORMAL HIGH (ref 0.44–1.00)
GFR calc Af Amer: 41 mL/min — ABNORMAL LOW (ref >60)
GFR calc non Af Amer: 35 mL/min — ABNORMAL LOW (ref >60)
Glucose, Bld: 228 mg/dL — ABNORMAL HIGH (ref 70–99)
Potassium: 5.2 mmol/L — ABNORMAL HIGH (ref 3.5–5.1)
Sodium: 138 mmol/L (ref 135–145)
Total Bilirubin: 0.7 mg/dL (ref 0.3–1.2)
Total Protein: 6.5 g/dL (ref 6.5–8.1)

## 2019-04-18 LAB — CBC WITH DIFFERENTIAL/PLATELET
Abs Immature Granulocytes: 0.04 10*3/uL (ref 0.00–0.07)
Basophils Absolute: 0 10*3/uL (ref 0.0–0.1)
Basophils Relative: 0 %
Eosinophils Absolute: 0 10*3/uL (ref 0.0–0.5)
Eosinophils Relative: 0 %
HCT: 39.2 % (ref 36.0–46.0)
Hemoglobin: 12.1 g/dL (ref 12.0–15.0)
Immature Granulocytes: 0 %
Lymphocytes Relative: 13 %
Lymphs Abs: 1.6 10*3/uL (ref 0.7–4.0)
MCH: 27.9 pg (ref 26.0–34.0)
MCHC: 30.9 g/dL (ref 30.0–36.0)
MCV: 90.3 fL (ref 80.0–100.0)
Monocytes Absolute: 0.6 10*3/uL (ref 0.1–1.0)
Monocytes Relative: 5 %
Neutro Abs: 10 10*3/uL — ABNORMAL HIGH (ref 1.7–7.7)
Neutrophils Relative %: 82 %
Platelets: 210 10*3/uL (ref 150–400)
RBC: 4.34 MIL/uL (ref 3.87–5.11)
RDW: 14.2 % (ref 11.5–15.5)
WBC: 12.2 10*3/uL — ABNORMAL HIGH (ref 4.0–10.5)
nRBC: 0 % (ref 0.0–0.2)

## 2019-04-18 LAB — GLUCOSE, CAPILLARY
Glucose-Capillary: 184 mg/dL — ABNORMAL HIGH (ref 70–99)
Glucose-Capillary: 196 mg/dL — ABNORMAL HIGH (ref 70–99)
Glucose-Capillary: 214 mg/dL — ABNORMAL HIGH (ref 70–99)
Glucose-Capillary: 190 mg/dL — ABNORMAL HIGH (ref 70–99)
Glucose-Capillary: 278 mg/dL — ABNORMAL HIGH (ref 70–99)

## 2019-04-18 LAB — ECHOCARDIOGRAM COMPLETE
Height: 62 in
Weight: 2400 oz

## 2019-04-18 LAB — TROPONIN I (HIGH SENSITIVITY)
Troponin I (High Sensitivity): 24 ng/L — ABNORMAL HIGH (ref <18)
Troponin I (High Sensitivity): 28 ng/L — ABNORMAL HIGH (ref <18)

## 2019-04-18 LAB — TSH: TSH: 0.494 u[IU]/mL (ref 0.350–4.500)

## 2019-04-18 LAB — HEMOGLOBIN A1C
Hgb A1c MFr Bld: 7.9 % — ABNORMAL HIGH (ref 4.8–5.6)
Mean Plasma Glucose: 180.03 mg/dL

## 2019-04-18 LAB — MAGNESIUM: Magnesium: 2 mg/dL (ref 1.7–2.4)

## 2019-04-18 NOTE — Progress Notes (Signed)
Pt up to recliner.  She is quite weak and required 2 assist as well as walker for balance.  I spoke to pt's grandson earlier this afternoon and he said that this is baseline for her and that she has "plenty of help" at home.  States that she is active with [facility] and CNA comes to house during the week.

## 2019-04-18 NOTE — Consult Note (Addendum)
**Note Norma-Identified via Obfuscation** Cardiology Consultation:   Patient ID: Norma [name] MRN: [id number]; DOB: 23-Dec-1933  Admit date: 04/17/2019 Date of Consult: 04/18/2019  Primary Care Provider: Caryl Bis, MD Primary Cardiologist: Remotely seen by Dr. Synetta [name], being newly seen by Dr. Radford [name], but will need eventual follow-up in [facility] Primary Electrophysiologist:  None    Patient Profile:   Norma [name] is a 85 y.o. female with a hx of diabetes mellitus type 2, chronic pain, Chronic Diastolic HF (EF 62-13% 0865), hypertension, hyperlipidemia, history of stroke (on Plavix) with right-sided weakness who is being seen today for the evaluation of new onset afib at the request of Dr. Sloan [name].  History of Present Illness:   Patient has history of remote cardiac catheterizations with the last one in 2008 showing 30% prox Cx and 70% distal Cx. No PCI performed and medical management was continued.  2012 patient followed with Norma [name] for presumed Tako-tsubo cardiomyopathy, evaluated with a Carolite study without any inferior ischemia. Patient had been admitted at the beginning of the year for Acute heart failure, EF was 20-25% with pulmonary hypertension (PA systolic pressure of 70 to 75 mm Hg) and renal insufficiency. Patient continued to follow with cardiology and eventually follow-up echo showed EF improvement to 50-55%.   In 2013 it appears a cardiac catheterization had been ordered for 04/15/2012, but the patient called to cancel it the day before. Patient did not follow-up with cardiology after this.   The last echo was 07/28/2017 for an admission for confusion and generalized weakness. Echo showed EF 60-65%, no wall motion abnormalities, diastolic dysfunction, mild MR, mild TR, PA peak pressure 37 mm HG.   Norma [name] presented to the ER 04/17/19 for difficulty swallowing. She was eating steak for lunch and felt the food was not able to go down or up. Patient was brought back into the endoscopic suite and was found be  in Afib with RVR, max HR 126. Patient underwent the EGD and food was pushed down. Patient is taking Plavix for h/o of stroke. Gastroenterologist recommended holding Plavix for 12 hours due to some mild bleeding during the procedure. Patient continued to stay in afib with RVR. Patient was given IV metoprolol 5mg  and HR went down to 100s. Patient denied chest pain, palpitations, or recent swelling. Patient says she had been feeling more tired the last week and was having difficulty bathing herself. Patient uses a walker. She was also having shortness of breath on exertion. She occasionally feels dizziness upon standing up as well. Labs revealed K+ 5.2, Glucose 228, Creatinine 1.39 (appears baseline), Mg 2.0, HS tropo 24>28,WBC 12.2, Hgb 12.1, A1C 7.9, TSH 0.494. Patient was a direct admit from endoscopy suite.   Today patient is still in Atrial fibrillation, 75bpm. It appears rates slowly improved overnight. Patient lives with her husband and grandson. She uses Lasix as needed for lower leg edema. She denies tobacco/alcohol/drug use. She has not seen a cardiologist recently or had any significant cardiac procedures since last seen by cardiology in 2012-2013. Denies h/o of heart attack or stents. Denies history of thyroid problems.   Heart Pathway Score:     Past Medical History:  Diagnosis Date   Anemia of other chronic disease    CAD (coronary artery disease)    a. cath 2008 - 30% prox Cx, 70% distal Cx treated medically.   Chronic diastolic CHF (congestive heart failure) (HCC)    Chronic pain syndrome    CKD (chronic kidney disease), stage III ([facility])  History of blood transfusion 1960's   "when my last child was born" (12/23/2012)   Hypoxemia    Other chronic pulmonary heart diseases    Pneumonia    "when I was little" (12/23/2012)   Stroke [facility]) ~ 2011   "slowed my walking" (12/23/2012)   Takotsubo cardiomyopathy    a. 2012 - dx unclear. Cardiolite OK but no cath at the time due to  renal insufficiency. EF was 20-25%, but normalized in 2018.   Type II diabetes mellitus (HCC)    Unspecified pleural effusion     Past Surgical History:  Procedure Laterality Date   ABDOMINAL HYSTERECTOMY     APPENDECTOMY     CATARACT EXTRACTION, BILATERAL     CHOLECYSTECTOMY     DILATION AND CURETTAGE OF UTERUS     PATELLA RECONSTRUCTION Bilateral    "had my knee caps replaced" (12/23/2012)   TONSILLECTOMY       Home Medications:  Prior to Admission medications   Medication Sig Start Date End Date Taking? Authorizing Provider  buPROPion (WELLBUTRIN XL) 150 MG 24 hr tablet Take 150 mg by mouth daily.  06/01/18  Yes [provider]  carvedilol (COREG) 6.25 MG tablet Take 1 tablet (6.25 mg total) 2 (two) times daily with a meal by mouth. 07/29/17  Yes Norma [name], Norma Blase, MD  clopidogrel (PLAVIX) 75 MG tablet Take 75 mg by mouth daily.   Yes [provider]  DULoxetine (CYMBALTA) 60 MG capsule Take 60 mg by mouth 2 (two) times daily.    Yes [provider]  furosemide (LASIX) 40 MG tablet Take 40 mg by mouth daily as needed for fluid.  12/04/10  Yes Norma Norma Scotland, MD  glipiZIDE (GLUCOTROL XL) 2.5 MG 24 hr tablet Take 2.5 mg by mouth daily with breakfast.   Yes [provider]  insulin detemir (LEVEMIR) 100 UNIT/ML injection Inject 0.25 mLs (25 Units total) at bedtime into the skin. Patient taking differently: Inject 20 Units into the skin at bedtime.  07/29/17  Yes Norma Odea, MD  oxyCODONE-acetaminophen (PERCOCET) 10-325 MG tablet Take 1 tablet 4 (four) times daily by mouth. 07/29/17  Yes Norma [name], Saima, MD  potassium chloride (K-DUR) 10 MEQ tablet Take 1 tablet by mouth daily.  02/18/16  Yes [provider]  simvastatin (ZOCOR) 20 MG tablet Take 20 mg by mouth every evening.    Yes [provider]  Cranberry 300 MG tablet Take 1 tablet (300 mg total) every other day by mouth. Patient not taking: Reported on 04/18/2019 07/29/17    Norma Odea, MD  saccharomyces boulardii (FLORASTOR) 250 MG capsule Take 1 capsule (250 mg total) 2 (two) times daily by mouth. Patient not taking: Reported on 04/18/2019 07/29/17   Norma Odea, MD    Inpatient Medications: Scheduled Meds:  buPROPion  150 mg Oral Daily   carvedilol  6.25 mg Oral BID WC   clopidogrel  75 mg Oral Daily   DULoxetine  60 mg Oral BID   insulin aspart  0-9 Units Subcutaneous TID WC   pantoprazole (PROTONIX) IV  40 mg Intravenous Q12H   simvastatin  20 mg Oral QPM   Continuous Infusions:  PRN Meds: acetaminophen **OR** acetaminophen, ondansetron **OR** ondansetron (ZOFRAN) IV, oxyCODONE-acetaminophen **AND** oxyCODONE  Allergies:   No Known Allergies  Social History:   Social History   Socioeconomic History   Marital status: Married    Spouse name: Not on file   Number of children: Not on file   Years of  education: Not on file   Highest education level: Not on file  Occupational History   Occupation: RETIRED  Social Designer, fashion/clothing strain: Not on file   Food insecurity    Worry: Not on file    Inability: Not on file   Transportation needs    Medical: Not on file    Non-medical: Not on file  Tobacco Use   Smoking status: Never Smoker   Smokeless tobacco: Never Used  Substance and Sexual Activity   Alcohol use: No   Drug use: No   Sexual activity: Never  Lifestyle   Physical activity    Days per week: Not on file    Minutes per session: Not on file   Stress: Not on file  Relationships   Social connections    Talks on phone: Not on file    Gets together: Not on file    Attends religious service: Not on file    Active member of club or organization: Not on file    Attends meetings of clubs or organizations: Not on file    Relationship status: Not on file   Intimate partner violence    Fear of current or ex partner: Not on file    Emotionally abused: Not on file    Physically abused: Not on file     Forced sexual activity: Not on file  Other Topics Concern   Not on file  Social History Narrative   Not on file    Family History:   Family History  Problem Relation Age of Onset   Heart attack Father 70       MI   Cancer Father        leukemia   Cancer Brother    Heart disease Daughter      ROS:  Please see the history of present illness.  All other ROS reviewed and negative.     Physical Exam/Data:   Vitals:   04/17/19 2320 04/18/19 0500 04/18/19 0831 04/18/19 1309  BP: 125/76 (!) 142/88 136/76 133/75  Pulse: 78 82 62 71  Resp: (!) 23 17 16 17   Temp: 98.4 F (36.9 C) 97.9 F (36.6 C) 97.7 F (36.5 C) 98.3 F (36.8 C)  TempSrc: Oral Oral Oral Oral  SpO2: 99% 99% 98% 98%  Weight:      Height:        Intake/Output Summary (Last 24 hours) at 04/18/2019 1431 Last data filed at 04/18/2019 1025 Gross per 24 hour  Intake 827 ml  Output 155 ml  Net 672 ml   Last 3 Weights 04/17/2019 08/09/2018 07/26/2017  Weight (lbs) 150 lb 176 lb 5.9 oz 174 lb 6.1 oz  Weight (kg) 68.04 kg 80 kg 79.1 kg     Body mass index is 27.44 kg/m.  General:  WF in no acute distress HEENT: normal Neck: no JVD Endocrine:  No thryomegaly Vascular: No carotid bruits; FA pulses 2+ bilaterally without bruits  Cardiac:  normal S1, S2; Irreg Irreg rhythym; no murmur  Lungs:  clear to auscultation bilaterally, no wheezing, rhonchi or rales  Abd: soft, nontender, no hepatomegaly  Ext: no edema Musculoskeletal:  No deformities, BUE and BLE strength normal and equal Skin: warm and dry  Neuro: general right sided weakness since stroke 2012 Psych:  Normal affect   EKG:  The EKG was personally reviewed and demonstrates:  Atrial fibrillation, 76 bpm Telemetry:  Telemetry was personally reviewed and demonstrates:  Atrial fibrillation, rates slowly improving over  the last 24 hours. Currently heart rates 70-80s, max 124.   Relevant CV Studies:  Cardiac Cath 2008     FINDINGS:  1. LV:  183/8/15. EF 65% without regional wall motion abnormality.  2. No aortic stenosis or mitral regurgitation.  3. Renal arteries: Single vessels bilaterally, both of which are  normal.  4. Left main:Angiographically normal.  5. LAD: Moderate-sized vessel giving rise to a small first and moderate-sized second diagonal. There are minor luminal irregularities along the course of the vessel.  6. Circumflex: Moderate size nondominant vessel. There is a 30% stenosis proximally. The distal circumflex has a 70% stenosis in a segment that is well under 2-mm in diameter.  7. RCA: Moderate-sized dominant vessel. The proximal vessel is calcified. There are only minor luminal irregularities.    IMPRESSION/RECOMMENDATIONS: The patient has moderate nonobstructive coronary disease with the most severe lesion being a 70% stenosis of the distal circumflex. The vessel in this region is well under 2-mm in diameter.      Echo 07/2017 Study Conclusions  - Left ventricle: The cavity size was normal. Wall thickness was   increased in a pattern of mild LVH. Systolic function was normal.   The estimated ejection fraction was in the range of 60% to 65%.   Wall motion was normal; there were no regional wall motion   abnormalities. Diastolic dysfunction, grade indeterminate.   Doppler parameters are consistent with high ventricular filling   pressure. - Mitral valve: There was mild regurgitation. - Tricuspid valve: There was mild regurgitation. - Pulmonary arteries: PA peak pressure: 37 mm Hg (S).  Laboratory Data:  High Sensitivity Troponin:   Recent Labs  Lab 04/18/19 0030 04/18/19 0237  TROPONINIHS 24* 28*     Cardiac EnzymesNo results for input(s): TROPONINI in the last 168 hours. No results for input(s): TROPIPOC in the last 168 hours.  Chemistry Recent Labs  Lab 04/17/19 2039 04/18/19 0030  NA 141 138  K 4.6 5.2*  CL  --  107  CO2  --  22  GLUCOSE 191* 228*  BUN  --  25*    CREATININE  --  1.39*  CALCIUM  --  8.9  GFRNONAA  --  35*  GFRAA  --  41*  ANIONGAP  --  9    Recent Labs  Lab 04/18/19 0030  PROT 6.5  ALBUMIN 3.3*  AST 71*  ALT 60*  ALKPHOS 154*  BILITOT 0.7   Hematology Recent Labs  Lab 04/17/19 2039 04/18/19 0030  WBC  --  12.2*  RBC  --  4.34  HGB 12.9 12.1  HCT 38.0 39.2  MCV  --  90.3  MCH  --  27.9  MCHC  --  30.9  RDW  --  14.2  PLT  --  210   BNPNo results for input(s): BNP, PROBNP in the last 168 hours.  DDimer No results for input(s): DDIMER in the last 168 hours.   Radiology/Studies:  Dg Chest Port 1 View  Result Date: 04/18/2019 CLINICAL DATA:  Cough and history of recent food impaction removed EXAM: PORTABLE CHEST 1 VIEW COMPARISON:  04/17/2019 FINDINGS: Cardiac [name] is enlarged but stable. Aortic calcifications are seen. Lungs are well aerated bilaterally. No focal infiltrate or effusion is seen. No pneumomediastinum is noted. No bony abnormality is seen. IMPRESSION: No acute abnormality noted. Electronically Signed   By: Inez Catalina M.D.   On: 04/18/2019 00:11    Assessment and Plan:   1. New onset Afib  with RVR -Patient presented to the ED for difficulty swallowing and was found to be in Efib RVR, Hr max 126. Patient underwent EGD and had mild bleeding from procedure. Patient was on Plavix for hx stroke, held for 12 hours post procedure.  -Patient received IV metoprolol 5 mg, HR improved to 100s - K+ 5.2, Mg 2.0 -Creatinine 1.39, appears to be around baseline -Hgb 12.1. HS trop 24>28 -TSH 0.494 -Coreg 6.25 BID for rate control (home dose) -Patient has stayed in atrial fib. Heart rates have improved, 70-80s. -Patient reports generalized weakness and DOE for the last week. Denies chest pain or palpitations - 07/2017 Echo EF 60-65%, no wall motion abnormalities, diastolic dysfunction, mild MR, mild TR, PA peak pressure 37 mm HG. -Will repeat echo -Last Cath in 2008 showed 30% prox Cx and 70% distal Cx. No  PCI then.  -Plavix restarted this morning -CHADS2VASC = 8 ( CHF, HTN, age>75, Stroke, DM, female) -Will need to start patient on a/c. Will discuss with MD Eliquis vs Xarelto. Patient qualifies for lower dose Xarelto. For Eliquis patient age >25, BMI 35, Creatinine on the border of 1.5 - Can consider Cardioversion outpatient since patient asymptomatic and rate controlled. This patients CHA2DS2-VASc Score and unadjusted Ischemic Stroke Rate (% per year) is equal to 10.8 % stroke rate/year from a score of 8 Above score calculated as 1 point each if present [CHF, HTN, DM, Vascular=MI/PAD/Aortic Plaque, Age if 65-74, or Female], 2 points each if present [Age > 75, or Stroke/TIA/TE]  2. Difficulty Swallowing/Food impaction -Per Gastroenterologist -IV Protonix and clear liquid diet, advance diet today -Cleared patient to restart a/c this morning  3. Chronic Diastolic HF with transient Cardiomyopathy - 2018 Echo showed EF 60-65%, no wall motion abnormalities, diastolic dysfunction, mild MR, mild TR, PA peak pressure 37 mm HG. -No signs of acute exacerbation -Patient says she takes Lasix as needed at home -Coreg -Will repeat echo  4. CAD -Last cath was 2008 30% prox Cx, 70% distal Cx, no PCI at that time. -No recent chest pain -HS Trop 24>28.  -No concern for ACS    5. HTN -Coreg -Pressures reasonable  6. CKD, stage 4 -Creatinine 1.39 -Baseline unsure, 1.3-1.5  7. DM 2, Uncontrolled -SSI per IM -A1C 7.9 yesterday  8. Hx of Stroke w/ residual right sided weakness -On Plavix with anticipated change of a/c  9. Hyperlipidemia -Simvastatin -No LDL on file -Per PCP  For questions or updates, please contact [facility] Please consult [email or web address] for contact info under     Signed, Ebelin Dillehay Ninfa Meeker, PA-C  04/18/2019 2:31 PM

## 2019-04-18 NOTE — Progress Notes (Signed)
PROGRESS NOTE    Norma [name]  WOE:[id number] DOB: [birth year] DOA: 04/17/2019 PCP: Caryl Bis, MD    Brief Narrative:  85 year old female with history of diabetes type 2, chronic pain, congestive heart failure, hypertension, history of a stroke with right-sided hemiparesis presented to the emergency room because of difficulty swallowing and food stuck on her throat after eating steak for lunch.  She was brought to endoscopy suite and was found with A. fib RVR.  Underwent EGD and impacted food on esophagus was pushed to the stomach with improvement with some superficial bleeding.  Because of A. fib with RVR, started on metoprolol and admitted to the hospital.  Seen by cardiology.   Assessment & Plan:   Principal Problem:   Atrial fibrillation with RVR (HCC) Active Problems:   Dilated cardiomyopathy (HCC)   Pulmonary hypertension (HCC)   Chronic kidney disease (CKD), stage III (moderate) (HCC)   CAD (coronary artery disease)   Uncontrolled type 2 diabetes mellitus with hyperglycemia, with long-term current use of insulin (HCC)   History of completed stroke   Food impaction of esophagus   New onset a-fib ([place])  New onset A. fib with RVR: Remains persistent A. fib.  Currently rate controlled.  TSH normal.  Echocardiogram pending.  Started on beta-blockers.  Cardiology to see the patient.  We will continue to monitor closely. Chads VASC 2 score 8 with history of diabetes and stroke.  Currently on Plavix.  Discussion about anticoagulation choices.  Esophageal dysphagia with recurrent food impaction: Patient is stay on mechanical soft diet.  Tolerating liquid diet.  Will advance to mechanical soft diet.  Dilated cardiomyopathy with history of stress cardiomyopathy: Currently euvolemic.  Type 2 diabetes: On insulin that she will continue.  History of a stroke: On Plavix, beta-blockers and a statin.  No new neurological deficit.  Patient has new onset A. fib.  She is needing  continuous monitoring, adjustment of medications, follow-up by cardiology.  Patient needs to be monitored for advancement of diet and recurrence of symptoms. Without hospitalization, she has high chance of deterioration of symptoms, she will need inpatient hospitalization.   DVT prophylaxis: SCDs Code Status: Full code Family Communication: None Disposition Plan: Home.  Anticipate tomorrow   Consultants:   Cardiology  Procedures:   EGD, 04/17/2019  Antimicrobials:   None   Subjective: Patient was seen and examined.  Denied any chest pain or palpitations.  She was able to swallow clear liquids without trouble.  A. fib on telemetry.  Objective: Vitals:   04/17/19 2320 04/18/19 0500 04/18/19 0831 04/18/19 1309  BP: 125/76 (!) 142/88 136/76 133/75  Pulse: 78 82 62 71  Resp: (!) 23 17 16 17   Temp: 98.4 F (36.9 C) 97.9 F (36.6 C) 97.7 F (36.5 C) 98.3 F (36.8 C)  TempSrc: Oral Oral Oral Oral  SpO2: 99% 99% 98% 98%  Weight:      Height:        Intake/Output Summary (Last 24 hours) at 04/18/2019 1444 Last data filed at 04/18/2019 1025 Gross per 24 hour  Intake 827 ml  Output 155 ml  Net 672 ml   Filed Weights   04/17/19 2151  Weight: 68 kg    Examination:  General exam: Appears calm and comfortable  Respiratory system: Clear to auscultation. Respiratory effort normal. Cardiovascular system: S1 & S2 heard, irregularly irregular. No JVD, murmurs, rubs, gallops or clicks. No pedal edema. Gastrointestinal system: Abdomen is nondistended, soft and nontender. No organomegaly or masses felt.  Normal bowel sounds heard. Central nervous system: Alert and oriented. No focal neurological deficits. Extremities: Symmetric 5 x 5 power. Skin: No rashes, lesions or ulcers Psychiatry: Judgement and insight appear normal. Mood & affect appropriate.     Data Reviewed: I have personally reviewed following labs and imaging studies  CBC: Recent Labs  Lab 04/17/19 2039 04/18/19  0030  WBC  --  12.2*  NEUTROABS  --  10.0*  HGB 12.9 12.1  HCT 38.0 39.2  MCV  --  90.3  PLT  --  626   Basic Metabolic Panel: Recent Labs  Lab 04/17/19 2039 04/18/19 0030  NA 141 138  K 4.6 5.2*  CL  --  107  CO2  --  22  GLUCOSE 191* 228*  BUN  --  25*  CREATININE  --  1.39*  CALCIUM  --  8.9  MG  --  2.0   GFR: Estimated Creatinine Clearance: 28.2 mL/min (A) (by C-G formula based on SCr of 1.39 mg/dL (H)). Liver Function Tests: Recent Labs  Lab 04/18/19 0030  AST 71*  ALT 60*  ALKPHOS 154*  BILITOT 0.7  PROT 6.5  ALBUMIN 3.3*   No results for input(s): LIPASE, AMYLASE in the last 168 hours. No results for input(s): AMMONIA in the last 168 hours. Coagulation Profile: No results for input(s): INR, PROTIME in the last 168 hours. Cardiac Enzymes: No results for input(s): CKTOTAL, CKMB, CKMBINDEX, TROPONINI in the last 168 hours. BNP (last 3 results) No results for input(s): PROBNP in the last 8760 hours. HbA1C: Recent Labs    04/18/19 0030  HGBA1C 7.9*   CBG: Recent Labs  Lab 04/17/19 2031 04/17/19 2142 04/18/19 0613 04/18/19 1107  GLUCAP 184* 191* 196* 214*   Lipid Profile: No results for input(s): CHOL, HDL, LDLCALC, TRIG, CHOLHDL, LDLDIRECT in the last 72 hours. Thyroid Function Tests: Recent Labs    04/18/19 0030  TSH 0.494   Anemia Panel: No results for input(s): VITAMINB12, FOLATE, FERRITIN, TIBC, IRON, RETICCTPCT in the last 72 hours. Sepsis Labs: No results for input(s): PROCALCITON, LATICACIDVEN in the last 168 hours.  Recent Results (from the past 240 hour(s))  SARS Coronavirus 2 [facility] order, Performed in [facility] hospital lab) Nasopharyngeal Nasopharyngeal Swab     Status: None   Collection Time: 04/17/19  6:43 PM   Specimen: Nasopharyngeal Swab  Result Value Ref Range Status   SARS Coronavirus 2 NEGATIVE NEGATIVE Final    Comment: (NOTE) If result is NEGATIVE SARS-CoV-2 target nucleic acids are NOT DETECTED. The  SARS-CoV-2 RNA is generally detectable in upper and lower  respiratory specimens during the acute phase of infection. The lowest  concentration of SARS-CoV-2 viral copies this assay can detect is 250  copies / mL. A negative result does not preclude SARS-CoV-2 infection  and should not be used as the sole basis for treatment or other  patient management decisions.  A negative result may occur with  improper specimen collection / handling, submission of specimen other  than nasopharyngeal swab, presence of viral mutation(s) within the  areas targeted by this assay, and inadequate number of viral copies  (<250 copies / mL). A negative result must be combined with clinical  observations, patient history, and epidemiological information. If result is POSITIVE SARS-CoV-2 target nucleic acids are DETECTED. The SARS-CoV-2 RNA is generally detectable in upper and lower  respiratory specimens dur ing the acute phase of infection.  Positive  results are indicative of active infection with SARS-CoV-2.  Clinical  correlation with patient history and other diagnostic information is  necessary to determine patient infection status.  Positive results do  not rule out bacterial infection or co-infection with other viruses. If result is PRESUMPTIVE POSTIVE SARS-CoV-2 nucleic acids MAY BE PRESENT.   A presumptive positive result was obtained on the submitted specimen  and confirmed on repeat testing.  While 2019 novel coronavirus  (SARS-CoV-2) nucleic acids may be present in the submitted sample  additional confirmatory testing may be necessary for epidemiological  and / or clinical management purposes  to differentiate between  SARS-CoV-2 and other Sarbecovirus currently known to infect humans.  If clinically indicated additional testing with an alternate test  methodology [phone]) is advised. The SARS-CoV-2 RNA is generally  detectable in upper and lower respiratory sp ecimens during the acute   phase of infection. The expected result is Negative. Fact Sheet for Patients:  StrictlyIdeas.no Fact Sheet for Healthcare Providers: BankingDealers.co.za This test is not yet approved or cleared by the [place] FDA and has been authorized for detection and/or diagnosis of SARS-CoV-2 by FDA under an Emergency Use Authorization (EUA).  This EUA will remain in effect (meaning this test can be used) for the duration of the COVID-19 declaration under Section 564(b)(1) of the Act, 21 U.S.C. section 360bbb-3(b)(1), unless the authorization is terminated or revoked sooner. Performed at [facility], [place] [address] [place], [place] [address]          Radiology Studies: Dg Chest Port 1 View  Result Date: 04/18/2019 CLINICAL DATA:  Cough and history of recent food impaction removed EXAM: PORTABLE CHEST 1 VIEW COMPARISON:  04/17/2019 FINDINGS: Cardiac shadow is enlarged but stable. Aortic calcifications are seen. Lungs are well aerated bilaterally. No focal infiltrate or effusion is seen. No pneumomediastinum is noted. No bony abnormality is seen. IMPRESSION: No acute abnormality noted. Electronically Signed   By: Inez Catalina M.D.   On: 04/18/2019 00:11        Scheduled Meds: . buPROPion  150 mg Oral Daily  . carvedilol  6.25 mg Oral BID WC  . clopidogrel  75 mg Oral Daily  . DULoxetine  60 mg Oral BID  . insulin aspart  0-9 Units Subcutaneous TID WC  . pantoprazole (PROTONIX) IV  40 mg Intravenous Q12H  . simvastatin  20 mg Oral QPM   Continuous Infusions:   LOS: 0 days    Time spent: 25 minutes    Barb Merino, MD [facility] Pager [phone]  If 7PM-7AM, please contact night-coverage [email or web address] Password [email or web address] 04/18/2019, 2:44 PM

## 2019-04-18 NOTE — Progress Notes (Signed)
Echocardiogram 2D Echocardiogram has been performed.  Oneal Deputy Zeven Kocak 04/18/2019, 3:34 PM

## 2019-04-19 DIAGNOSIS — R7989 Other specified abnormal findings of blood chemistry: Secondary | ICD-10-CM

## 2019-04-19 DIAGNOSIS — I5042 Chronic combined systolic (congestive) and diastolic (congestive) heart failure: Secondary | ICD-10-CM

## 2019-04-19 DIAGNOSIS — I5032 Chronic diastolic (congestive) heart failure: Secondary | ICD-10-CM

## 2019-04-19 DIAGNOSIS — I420 Dilated cardiomyopathy: Secondary | ICD-10-CM

## 2019-04-19 LAB — CBC WITH DIFFERENTIAL/PLATELET
Abs Immature Granulocytes: 0.02 10*3/uL (ref 0.00–0.07)
Basophils Absolute: 0 10*3/uL (ref 0.0–0.1)
Basophils Relative: 0 %
Eosinophils Absolute: 0.1 10*3/uL (ref 0.0–0.5)
Eosinophils Relative: 1 %
HCT: 37.6 % (ref 36.0–46.0)
Hemoglobin: 11.8 g/dL — ABNORMAL LOW (ref 12.0–15.0)
Immature Granulocytes: 0 %
Lymphocytes Relative: 34 %
Lymphs Abs: 3 10*3/uL (ref 0.7–4.0)
MCH: 28.5 pg (ref 26.0–34.0)
MCHC: 31.4 g/dL (ref 30.0–36.0)
MCV: 90.8 fL (ref 80.0–100.0)
Monocytes Absolute: 0.7 10*3/uL (ref 0.1–1.0)
Monocytes Relative: 8 %
Neutro Abs: 5 10*3/uL (ref 1.7–7.7)
Neutrophils Relative %: 57 %
Platelets: 153 10*3/uL (ref 150–400)
RBC: 4.14 MIL/uL (ref 3.87–5.11)
RDW: 14.2 % (ref 11.5–15.5)
WBC: 8.7 10*3/uL (ref 4.0–10.5)
nRBC: 0 % (ref 0.0–0.2)

## 2019-04-19 LAB — BASIC METABOLIC PANEL
Anion gap: 11 (ref 5–15)
BUN: 22 mg/dL (ref 8–23)
CO2: 20 mmol/L — ABNORMAL LOW (ref 22–32)
Calcium: 9.1 mg/dL (ref 8.9–10.3)
Chloride: 107 mmol/L (ref 98–111)
Creatinine, Ser: 1.43 mg/dL — ABNORMAL HIGH (ref 0.44–1.00)
GFR calc Af Amer: 39 mL/min — ABNORMAL LOW (ref >60)
GFR calc non Af Amer: 34 mL/min — ABNORMAL LOW (ref >60)
Glucose, Bld: 181 mg/dL — ABNORMAL HIGH (ref 70–99)
Potassium: 4.5 mmol/L (ref 3.5–5.1)
Sodium: 138 mmol/L (ref 135–145)

## 2019-04-19 LAB — GLUCOSE, CAPILLARY
Glucose-Capillary: 159 mg/dL — ABNORMAL HIGH (ref 70–99)
Glucose-Capillary: 198 mg/dL — ABNORMAL HIGH (ref 70–99)
Glucose-Capillary: 257 mg/dL — ABNORMAL HIGH (ref 70–99)

## 2019-04-19 MED ORDER — APIXABAN 5 MG PO TABS
5.0000 mg | ORAL_TABLET | Freq: Two times a day (BID) | ORAL | Status: DC
  Administered 2019-04-19 – 2019-04-21 (×5): 5 mg via ORAL
  Filled 2019-04-19 (×5): qty 1

## 2019-04-19 MED ORDER — PANTOPRAZOLE SODIUM 40 MG PO TBEC
40.0000 mg | DELAYED_RELEASE_TABLET | Freq: Two times a day (BID) | ORAL | Status: DC
  Administered 2019-04-19 – 2019-04-21 (×4): 40 mg via ORAL
  Filled 2019-04-19 (×4): qty 1

## 2019-04-19 NOTE — Discharge Instructions (Signed)

## 2019-04-19 NOTE — Evaluation (Signed)
Physical Therapy Evaluation Patient Details Name: Norma [name] MRN: [id number] DOB: 06-Jan-1934 Today's Date: 04/19/2019   History of Present Illness  85 y.o. female with a hx of DM type 2, chronic diastolic CHF, HTN, HLD, CVA (now on Plavix), cath 2008 with 30% prox LCx and 70% distal LCx. Also had Takotsubo CM in 2012 and MPI with no ischemia. Echo early in the year showed EF 20-25% in setting of acute CHF but repeat echo on followup was normal.  Now admitted with food impaction and found to be in new onset afib.  Clinical Impression  Pt admitted with above diagnosis. Pt currently with functional limitations due to the deficits listed below (see PT Problem List). Pt was able to take a few pivotal steps to recliner from bed with mod assist for sit to stand and min assist and cues for transfer.  States husband provides this assist at home.  If husband can provide 24 hour care, HHPT appropriate. If he cannot, SNF.   Pt will benefit from skilled PT to increase their independence and safety with mobility to allow discharge to the venue listed below.      Follow Up Recommendations Home health PT;Supervision/Assistance - 24 hour    Equipment Recommendations  None recommended by PT    Recommendations for Other Services       Precautions / Restrictions Precautions Precautions: Fall Restrictions Weight Bearing Restrictions: No      Mobility  Bed Mobility Overal bed mobility: Needs Assistance Bed Mobility: Supine to Sit     Supine to sit: Mod assist     General bed mobility comments: Initiates movement of LEs but needed assist for elevation of trunk and use of pad to scoot to EOB.   Transfers Overall transfer level: Needs assistance Equipment used: Rolling walker (2 wheeled) Transfers: Sit to/from [facility] Sit to Stand: Mod assist Stand pivot transfers: Min assist       General transfer comment: Pt needed mod assist to power up and doesn't stand fully  upright.  Pt able to stand pivot to recliner with min assist with difficulty moving right LE to step around to the chair but was able to do so with incr time and cues. States this is typical and that husband can help her at home.    Ambulation/Gait                Stairs            Wheelchair Mobility    Modified Rankin (Stroke Patients Only)       Balance Overall balance assessment: Needs assistance Sitting-balance support: No upper extremity supported;Feet supported Sitting balance-Leahy Scale: Fair     Standing balance support: Bilateral upper extremity supported;During functional activity Standing balance-Leahy Scale: Poor Standing balance comment: relies on RW and external support. Flexed posture                             Pertinent Vitals/Pain Pain Assessment: No/denies pain    Home Living Family/patient expects to be discharged to:: Private residence Living Arrangements: Spouse/significant other Available Help at Discharge: Family;Available 24 hours/day Type of Home: House Home Access: Ramped entrance     Home Layout: One level Home Equipment: Walker - 2 wheels;Bedside commode;Shower seat;Wheelchair - manual      Prior Function Level of Independence: Needs assistance   Gait / Transfers Assistance Needed: needs walker for inside the house uses wheelchair outside  ADL's / Homemaking Assistance Needed: husband has recently been sponge bathing pt but she does get on seat in tub as well.          Hand Dominance        Extremity/Trunk Assessment   Upper Extremity Assessment Upper Extremity Assessment: Defer to OT evaluation    Lower Extremity Assessment Lower Extremity Assessment: RLE deficits/detail RLE Deficits / Details: grossly 3-5    Cervical / Trunk Assessment Cervical / Trunk Assessment: Kyphotic  Communication   Communication: No difficulties  Cognition Arousal/Alertness: Awake/alert Behavior During Therapy: Flat  affect Overall Cognitive Status: Within Functional Limits for tasks assessed                                        General Comments      Exercises     Assessment/Plan    PT Assessment Patient needs continued PT services  PT Problem List Decreased activity tolerance;Decreased balance;Decreased mobility;Decreased strength;Decreased knowledge of use of DME;Decreased safety awareness;Decreased knowledge of precautions       PT Treatment Interventions DME instruction;Gait training;Functional mobility training;Therapeutic activities;Therapeutic exercise;Balance training;Patient/family education    PT Goals (Current goals can be found in the Care Plan section)  Acute Rehab PT Goals Patient Stated Goal: to go home PT Goal Formulation: With patient Time For Goal Achievement: 05/03/19 Potential to Achieve Goals: Good    Frequency Min 3X/week   Barriers to discharge        Co-evaluation               AM-PAC PT "6 Clicks" Mobility  Outcome Measure Help needed turning from your back to your side while in a flat bed without using bedrails?: A Little Help needed moving from lying on your back to sitting on the side of a flat bed without using bedrails?: A Lot Help needed moving to and from a bed to a chair (including a wheelchair)?: A Lot Help needed standing up from a chair using your arms (e.g., wheelchair or bedside chair)?: A Lot Help needed to walk in hospital room?: Total Help needed climbing 3-5 steps with a railing? : Total 6 Click Score: 11    End of Session Equipment Utilized During Treatment: Gait belt Activity Tolerance: Patient limited by fatigue Patient left: in chair;with call bell/phone within reach;with chair alarm set Nurse Communication: Mobility status PT Visit Diagnosis: Muscle weakness (generalized) (M62.81);Unsteadiness on feet (R26.81)    Time: 1610-9604 PT Time Calculation (min) (ACUTE ONLY): 24 min   Charges:   PT  Evaluation $PT Eval Moderate Complexity: 1 Mod PT Treatments $Therapeutic Activity: 8-22 mins        Codi Kertz,PT Acute Rehabilitation Services Pager:  [phone]  Office:  [place] 04/19/2019, 11:41 AM

## 2019-04-19 NOTE — Progress Notes (Signed)
Tried to educate pt on Eliquis. Not at all confident in what information she did/didn't absorb. She said her daughter died on Easter and granddaughter Elmyra Ricks number not found in computer (who does her pill box). Lives alone with husband.Patient won't give me Granddaughter's number because it is "unlisted." She took the pharmacy phone number and will have Elmyra Ricks call us for further Eliquis education.Also posted in discharge AVS.  Trinty Marken S. Alford Highland, PharmD, [place] Clinical Staff Pharmacist

## 2019-04-19 NOTE — Progress Notes (Signed)
Progress Note  Patient Name: Norma [name] Date of Encounter: 04/19/2019  Primary Cardiologist: Fransico Him, MD   Subjective   No complaints this am.  Remains in afib with CVR.  Inpatient Medications    Scheduled Meds: . apixaban  5 mg Oral BID  . buPROPion  150 mg Oral Daily  . carvedilol  6.25 mg Oral BID WC  . DULoxetine  60 mg Oral BID  . insulin aspart  0-9 Units Subcutaneous TID WC  . pantoprazole (PROTONIX) IV  40 mg Intravenous Q12H  . simvastatin  20 mg Oral QPM   Continuous Infusions:  PRN Meds: acetaminophen **OR** acetaminophen, ondansetron **OR** ondansetron (ZOFRAN) IV, oxyCODONE-acetaminophen **AND** oxyCODONE   Vital Signs    Vitals:   04/18/19 1309 04/18/19 1656 04/18/19 1931 04/19/19 0419  BP: 133/75 134/80 99/82 (!) 120/51  Pulse: 71 87 86 75  Resp: 17 (!) 25 20 15   Temp: 98.3 F (36.8 C) 97.6 F (36.4 C) 98 F (36.7 C) (!) 97.5 F (36.4 C)  TempSrc: Oral Oral Oral Oral  SpO2: 98% 96% 98% 97%  Weight:      Height:        Intake/Output Summary (Last 24 hours) at 04/19/2019 1033 Last data filed at 04/19/2019 0900 Gross per 24 hour  Intake 300 ml  Output 950 ml  Net -650 ml   Filed Weights   04/17/19 2151  Weight: 68 kg    Telemetry    Atrial fibrillation - Personally Reviewed  ECG    No new EKG to review - Personally Reviewed  Physical Exam   GEN: No acute distress.   Neck: No JVD Cardiac: irregularly irregular, no murmurs, rubs, or gallops.  Respiratory: Clear to auscultation bilaterally. GI: Soft, nontender, non-distended  MS: No edema; No deformity. Neuro:  Nonfocal  Psych: Normal affect   Labs    Chemistry Recent Labs  Lab 04/17/19 2039 04/18/19 0030 04/19/19 0525  NA 141 138 138  K 4.6 5.2* 4.5  CL  --  107 107  CO2  --  22 20*  GLUCOSE 191* 228* 181*  BUN  --  25* 22  CREATININE  --  1.39* 1.43*  CALCIUM  --  8.9 9.1  PROT  --  6.5  --   ALBUMIN  --  3.3*  --   AST  --  71*  --   ALT  --  60*  --    ALKPHOS  --  154*  --   BILITOT  --  0.7  --   GFRNONAA  --  35* 34*  GFRAA  --  41* 39*  ANIONGAP  --  9 11     Hematology Recent Labs  Lab 04/17/19 2039 04/18/19 0030 04/19/19 0525  WBC  --  12.2* 8.7  RBC  --  4.34 4.14  HGB 12.9 12.1 11.8*  HCT 38.0 39.2 37.6  MCV  --  90.3 90.8  MCH  --  27.9 28.5  MCHC  --  30.9 31.4  RDW  --  14.2 14.2  PLT  --  210 153    Cardiac EnzymesNo results for input(s): TROPONINI in the last 168 hours. No results for input(s): TROPIPOC in the last 168 hours.   BNPNo results for input(s): BNP, PROBNP in the last 168 hours.   DDimer No results for input(s): DDIMER in the last 168 hours.   Radiology    Dg Chest Port 1 View  Result Date: 04/18/2019 CLINICAL DATA:  Cough and history of recent food impaction removed EXAM: PORTABLE CHEST 1 VIEW COMPARISON:  04/17/2019 FINDINGS: Cardiac shadow is enlarged but stable. Aortic calcifications are seen. Lungs are well aerated bilaterally. No focal infiltrate or effusion is seen. No pneumomediastinum is noted. No bony abnormality is seen. IMPRESSION: No acute abnormality noted. Electronically Signed   By: Inez Catalina M.D.   On: 04/18/2019 00:11    Cardiac Studies   2D echo 04/18/2019 IMPRESSIONS    1. The left ventricle has mildly reduced systolic function, with an ejection fraction of 45-50%. The cavity size was normal. There is mildly increased left ventricular wall thickness. Left ventricular diastolic Doppler parameters are indeterminate.  Hypokinesis of the mid anteroseptal and mid inferoseptal wall segments, unusual pattern.  2. The right ventricle has mildly reduced systolic function. The cavity was mildly enlarged. There is no increase in right ventricular wall thickness.  3. Left atrial size was moderately dilated.  4. Right atrial size was mildly dilated.  5. There is mild mitral annular calcification present. No evidence of mitral valve stenosis. Mild mitral regurgitation.  6. Tricuspid  valve regurgitation is mild-moderate.  7. The aortic valve is tricuspid. Mild calcification of the aortic valve. No stenosis of the aortic valve.  8. The aorta is normal in size and structure.  9. The inferior vena cava was normal in size with <50% respiratory variability. PA systolic pressure 31 mmHg. 10. The patient was in atrial fibrillation.  Patient Profile     85 y.o. female with a hx of DM type 2, chronic diastolic CHF, HTN, HLD, CVA (now on Plavix), cath 2008 with 30% prox LCx and 70% distal LCx.  Also had Takotsubo CM in 2012 and MPI with no ischemia.  Echo early in the year showed EF 20-25% in setting of acute CHF but repeat echo on followup was normal.  Now admitted with food impaction and found to be in new onset afib.  Assessment & Plan    1. New onset Afib with RVR -Patient presented to the ED for difficulty swallowing and was found to be in Efib RVR, Hr max 126. -Patient underwent EGD and had mild bleeding from procedure.  -Patient was on Plavix for hx stroke which has now been stopped due to need for DOAC -TSH 0.494 -HR controlled on tele so will continue on Coreg 6.25 BID for rate control (home dose) -CHADS2VASC = 8 ( CHF, HTN, age>75, Stroke, DM, female) and now on Eliquis 5mg  BID -plan outpt Cardioversion since patient asymptomatic and rate controlled. -LA moderately dilated on echo  2.  Chronic combined systolic/diastolic CHF -Patient reports generalized weakness and DOE for the last week. Denies chest pain or palpitations -has a hx of Takotsubo CM in the past and echo showed EF 20-25% at that time but then normalized by echo 07/2017 - 07/2017 Echo EF 60-65%, no wall motion abnormalities, diastolic dysfunction, mild MR, mild TR, PA peak pressure 37 mm HG. -Last Cath in 2008 showed 30% prox Cx and 70% distal Cx. No PCI then.  -does not appear volume overloaded on exam. -repeat 2D echo this admit with mildly reduced LVF EF 45-50% with HK of the mid anteroseptal and  inferoseptal walls. -on PRN Lasix at home -continue Carvedilol   3. Difficulty Swallowing/Food impaction -Per Gastroenterologist -IV Protonix and clear liquid diet, advance diet today -Cleared patient to restart a/c this morning  4. CAD -Last cath was 2008 30% prox Cx, 70% distal Cx, no PCI at that time. -No  recent chest pain -HS Trop 24>28.  -she does have new T wave abnormality in the anteroseptal leads -likely due to demand ischemia in the setting of afib but given new T wave abnormalities and wall motion abnormality in same area will get Lexiscan myoview to rule out ischemia   5. HTN -BP well controlled at 120/71mmHg today -Continue carvedilol 6.25mg  BID  6. CKD, stage 4 -Creatinine up from 1.39>1.43 this am -Baseline unsure, 1.3-1.5  7. DM 2, Uncontrolled -SSI per IM -A1C 7.9   8. Hx of Stroke w/ residual right sided weakness -had been on plavix but stopped after initiation of Eliquis  9. Hyperlipidemia -continue statin -No LDL on file -Per PCP  For questions or updates, please contact [place] Please consult [email or web address] for contact info under Cardiology/STEMI.      Signed, Fransico Him, MD  04/19/2019, 10:33 AM

## 2019-04-19 NOTE — Progress Notes (Signed)
PROGRESS NOTE    Norma [name]  ZHY:[id number] DOB: [birth year] DOA: 04/17/2019 PCP: Caryl Bis, MD    Brief Narrative:  85 year old female with history of diabetes type 2, chronic pain, congestive heart failure, hypertension, history of a stroke with right-sided hemiparesis presented to the emergency room because of difficulty swallowing and food stuck on her throat after eating steak for lunch.  She was brought to endoscopy suite and was found with A. fib RVR.  Underwent EGD and impacted food on esophagus was pushed to the stomach with improvement with some superficial bleeding.  Because of A. fib with RVR, started on metoprolol and admitted to the hospital.  Seen by cardiology.    Subjective:  The patient was seen and examined this morning, stable no acute distress heart rate within control, denies any palpitation or chest pain or shortness of breath.    Assessment & Plan:   Principal Problem:   Atrial fibrillation with RVR (HCC) Active Problems:   Dilated cardiomyopathy (HCC)   Pulmonary hypertension (HCC)   Chronic kidney disease (CKD), stage III (moderate) (HCC)   CAD (coronary artery disease)   Uncontrolled type 2 diabetes mellitus with hyperglycemia, with long-term current use of insulin (HCC)   History of completed stroke   Food impaction of esophagus   New onset a-fib ([place])   Chronic diastolic heart failure ([place])  New onset A. fib with RVR:  Remains persistent A. fib.  Currently rate controlled.  TSH normal.   Echocardiogram -reviewed as below -Stable  on beta-blockers. -Cardiology following closely  Chads VASC 2 score 8 with history of diabetes and stroke.  Patient has been started on Eliquis and aspirin DC home dose Plavix   Esophageal dysphagia - with recurrent food impaction: Patient is stay on mechanical soft diet.  Tolerating liquid diet.  Will advance to mechanical soft diet. -Currently stable tolerating diet  Dilated cardiomyopathy with history of  stress cardiomyopathy: Currently euvolemic. -Remained stable  Type 2 diabetes: On insulin that she will continue.  History of a stroke:  -Severe generalized weaknesses due to previous stroke, -She has been on Plavix, now switched to aspirin and Eliquis, -Continue beta-blocker and statins No new neurological deficit.    DVT prophylaxis: SCDs Code Status: Full code Family Communication: None Disposition Plan: Home.  Anticipate tomorrow Remain inpatient - Patient has new onset A. fib.  She is needing continuous monitoring, adjustment of medications, follow-up by cardiology.  Patient needs to be monitored for advancement of diet and recurrence of symptoms. Without hospitalization, she has high chance of deterioration of symptoms, she will need inpatient hospitalization.   Consultants:   Cardiology  Procedures:   EGD, 04/17/2019  Antimicrobials:   None  2D echocardiogram:IMPRESSIONS    1. The left ventricle has mildly reduced systolic function, with an ejection fraction of 45-50%. The cavity size was normal. There is mildly increased left ventricular wall thickness. Left ventricular diastolic Doppler parameters are indeterminate.  Hypokinesis of the mid anteroseptal and mid inferoseptal wall segments, unusual pattern.  2. The right ventricle has mildly reduced systolic function. The cavity was mildly enlarged. There is no increase in right ventricular wall thickness.  3. Left atrial size was moderately dilated.  4. Right atrial size was mildly dilated.  5. There is mild mitral annular calcification present. No evidence of mitral valve stenosis. Mild mitral regurgitation.  6. Tricuspid valve regurgitation is mild-moderate.  7. The aortic valve is tricuspid. Mild calcification of the aortic valve. No stenosis of the  aortic valve.  8. The aorta is normal in size and structure.  9. The inferior vena cava was normal in size with <50% respiratory variability. PA systolic pressure 31  mmHg. 10. The patient was in atrial fibrillation.     Objective: Vitals:   04/18/19 1656 04/18/19 1931 04/19/19 0419 04/19/19 1122  BP: 134/80 99/82 (!) 120/51 (!) 157/95  Pulse: 87 86 75 78  Resp: (!) 25 20 15 14   Temp: 97.6 F (36.4 C) 98 F (36.7 C) (!) 97.5 F (36.4 C) 98.2 F (36.8 C)  TempSrc: Oral Oral Oral Oral  SpO2: 96% 98% 97% 98%  Weight:      Height:        Intake/Output Summary (Last 24 hours) at 04/19/2019 1343 Last data filed at 04/19/2019 0900 Gross per 24 hour  Intake 200 ml  Output 950 ml  Net -750 ml   Filed Weights   04/17/19 2151  Weight: 68 kg   BP (!) 157/95 (BP Location: Right Arm)   Pulse 78   Temp 98.2 F (36.8 C) (Oral)   Resp 14   Ht 5\' 2"  (1.575 m)   Wt 68 kg   SpO2 98%   BMI 27.44 kg/m    Physical Exam  Constitution:  Alert, cooperative, no distress,  Psychiatric: Normal and stable mood and affect, cognition intact,   HEENT: Normocephalic, PERRL, otherwise with in Normal limits  Chest:Chest symmetric Cardio vascular:  S1/S2, RRR, No murmure, No Rubs or Gallops  pulmonary: Clear to auscultation bilaterally, respirations unlabored, negative wheezes / crackles Abdomen: Soft, non-tender, non-distended, bowel sounds,no masses, no organomegaly Muscular skeletal: Limited exam -severe generalized weakness is noted in bed, able to move all 4 extremities, Normal strength,  Neuro: CNII-XII intact. , normal motor and sensation, reflexes intact  Extremities: No pitting edema lower extremities, +2 pulses  Skin: Dry, warm to touch, negative for any Rashes, No open wounds        Data Reviewed: I have personally reviewed following labs and imaging studies  CBC: Recent Labs  Lab 04/17/19 2039 04/18/19 0030 04/19/19 0525  WBC  --  12.2* 8.7  NEUTROABS  --  10.0* 5.0  HGB 12.9 12.1 11.8*  HCT 38.0 39.2 37.6  MCV  --  90.3 90.8  PLT  --  210 660   Basic Metabolic Panel: Recent Labs  Lab 04/17/19 2039 04/18/19 0030 04/19/19 0525   NA 141 138 138  K 4.6 5.2* 4.5  CL  --  107 107  CO2  --  22 20*  GLUCOSE 191* 228* 181*  BUN  --  25* 22  CREATININE  --  1.39* 1.43*  CALCIUM  --  8.9 9.1  MG  --  2.0  --    GFR: Estimated Creatinine Clearance: 27.4 mL/min (A) (by C-G formula based on SCr of 1.43 mg/dL (H)). Liver Function Tests: Recent Labs  Lab 04/18/19 0030  AST 71*  ALT 60*  ALKPHOS 154*  BILITOT 0.7  PROT 6.5  ALBUMIN 3.3*   No results for input(s): LIPASE, AMYLASE in the last 168 hours. No results for input(s): AMMONIA in the last 168 hours. Coagulation Profile: No results for input(s): INR, PROTIME in the last 168 hours. Cardiac Enzymes: No results for input(s): CKTOTAL, CKMB, CKMBINDEX, TROPONINI in the last 168 hours. BNP (last 3 results) No results for input(s): PROBNP in the last 8760 hours. HbA1C: Recent Labs    04/18/19 0030  HGBA1C 7.9*   CBG: Recent Labs  Lab 04/18/19 1107 04/18/19 1644 04/18/19 2108 04/19/19 0627 04/19/19 1124  GLUCAP 214* 190* 278* 159* 198*   Lipid Profile: No results for input(s): CHOL, HDL, LDLCALC, TRIG, CHOLHDL, LDLDIRECT in the last 72 hours. Thyroid Function Tests: Recent Labs    04/18/19 0030  TSH 0.494   Anemia Panel: No results for input(s): VITAMINB12, FOLATE, FERRITIN, TIBC, IRON, RETICCTPCT in the last 72 hours. Sepsis Labs: No results for input(s): PROCALCITON, LATICACIDVEN in the last 168 hours.  Recent Results (from the past 240 hour(s))  SARS Coronavirus 2 [facility] order, Performed in [facility] hospital lab) Nasopharyngeal Nasopharyngeal Swab     Status: None   Collection Time: 04/17/19  6:43 PM   Specimen: Nasopharyngeal Swab  Result Value Ref Range Status   SARS Coronavirus 2 NEGATIVE NEGATIVE Final    Comment: (NOTE) If result is NEGATIVE SARS-CoV-2 target nucleic acids are NOT DETECTED. The SARS-CoV-2 RNA is generally detectable in upper and lower  respiratory specimens during the acute phase of infection. The lowest   concentration of SARS-CoV-2 viral copies this assay can detect is 250  copies / mL. A negative result does not preclude SARS-CoV-2 infection  and should not be used as the sole basis for treatment or other  patient management decisions.  A negative result may occur with  improper specimen collection / handling, submission of specimen other  than nasopharyngeal swab, presence of viral mutation(s) within the  areas targeted by this assay, and inadequate number of viral copies  (<250 copies / mL). A negative result must be combined with clinical  observations, patient history, and epidemiological information. If result is POSITIVE SARS-CoV-2 target nucleic acids are DETECTED. The SARS-CoV-2 RNA is generally detectable in upper and lower  respiratory specimens dur ing the acute phase of infection.  Positive  results are indicative of active infection with SARS-CoV-2.  Clinical  correlation with patient history and other diagnostic information is  necessary to determine patient infection status.  Positive results do  not rule out bacterial infection or co-infection with other viruses. If result is PRESUMPTIVE POSTIVE SARS-CoV-2 nucleic acids MAY BE PRESENT.   A presumptive positive result was obtained on the submitted specimen  and confirmed on repeat testing.  While 2019 novel coronavirus  (SARS-CoV-2) nucleic acids may be present in the submitted sample  additional confirmatory testing may be necessary for epidemiological  and / or clinical management purposes  to differentiate between  SARS-CoV-2 and other Sarbecovirus currently known to infect humans.  If clinically indicated additional testing with an alternate test  methodology [phone]) is advised. The SARS-CoV-2 RNA is generally  detectable in upper and lower respiratory sp ecimens during the acute  phase of infection. The expected result is Negative. Fact Sheet for Patients:  StrictlyIdeas.no Fact Sheet  for Healthcare Providers: BankingDealers.co.za This test is not yet approved or cleared by the [place] FDA and has been authorized for detection and/or diagnosis of SARS-CoV-2 by FDA under an Emergency Use Authorization (EUA).  This EUA will remain in effect (meaning this test can be used) for the duration of the COVID-19 declaration under Section 564(b)(1) of the Act, 21 U.S.C. section 360bbb-3(b)(1), unless the authorization is terminated or revoked sooner. Performed at [facility], [place] [address], [place], [place] [address]          Radiology Studies: Dg Chest [facility] 1 View  Result Date: 04/18/2019 CLINICAL DATA:  Cough and history of recent food impaction removed EXAM: PORTABLE CHEST 1 VIEW COMPARISON:  04/17/2019 FINDINGS: Cardiac shadow is enlarged but stable. Aortic calcifications are seen. Lungs are well aerated bilaterally. No focal infiltrate or effusion is seen. No pneumomediastinum is noted. No bony abnormality is seen. IMPRESSION: No acute abnormality noted. Electronically Signed   By: Inez Catalina M.D.   On: 04/18/2019 00:11        Scheduled Meds: . apixaban  5 mg Oral BID  . buPROPion  150 mg Oral Daily  . carvedilol  6.25 mg Oral BID WC  . DULoxetine  60 mg Oral BID  . insulin aspart  0-9 Units Subcutaneous TID WC  . pantoprazole  40 mg Oral BID  . simvastatin  20 mg Oral QPM   Continuous Infusions:   LOS: 1 day    Time spent: 25 minutes    Deatra James, MD [facility] Pager [phone]  If 7PM-7AM, please contact night-coverage [email or web address] Password [email or web address] 04/19/2019, 1:43 PM

## 2019-04-19 NOTE — TOC Benefit Eligibility Note (Signed)
Transition of Care [facility]) Benefit Eligibility Note    Patient Details  Name: Norma [name] MRN: [id number] Date of Birth: [birth year]   Medication/Dose: Arne [place]  5 MG BID  Covered?: Yes  Tier: 3 Drug  Prescription Coverage Preferred Pharmacy: [place] with Person/Company/Phone Number:: TERRI  @ [place] # [phone]  Co-Pay: Johnsie Kindred  Prior Approval: No  Deductible: Met       Memory Argue Phone Number: 04/19/2019, 10:49 AM

## 2019-04-20 ENCOUNTER — Inpatient Hospital Stay (HOSPITAL_COMMUNITY): Payer: Medicare HMO

## 2019-04-20 DIAGNOSIS — Z8673 Personal history of transient ischemic attack (TIA), and cerebral infarction without residual deficits: Secondary | ICD-10-CM

## 2019-04-20 DIAGNOSIS — I272 Pulmonary hypertension, unspecified: Secondary | ICD-10-CM

## 2019-04-20 DIAGNOSIS — Z794 Long term (current) use of insulin: Secondary | ICD-10-CM

## 2019-04-20 DIAGNOSIS — T18128D Food in esophagus causing other injury, subsequent encounter: Secondary | ICD-10-CM

## 2019-04-20 DIAGNOSIS — I2510 Atherosclerotic heart disease of native coronary artery without angina pectoris: Secondary | ICD-10-CM

## 2019-04-20 DIAGNOSIS — E1165 Type 2 diabetes mellitus with hyperglycemia: Secondary | ICD-10-CM

## 2019-04-20 DIAGNOSIS — I4891 Unspecified atrial fibrillation: Secondary | ICD-10-CM

## 2019-04-20 DIAGNOSIS — I5043 Acute on chronic combined systolic (congestive) and diastolic (congestive) heart failure: Secondary | ICD-10-CM

## 2019-04-20 LAB — GLUCOSE, CAPILLARY
Glucose-Capillary: 193 mg/dL — ABNORMAL HIGH (ref 70–99)
Glucose-Capillary: 167 mg/dL — ABNORMAL HIGH (ref 70–99)
Glucose-Capillary: 210 mg/dL — ABNORMAL HIGH (ref 70–99)
Glucose-Capillary: 212 mg/dL — ABNORMAL HIGH (ref 70–99)
Glucose-Capillary: 203 mg/dL — ABNORMAL HIGH (ref 70–99)

## 2019-04-20 LAB — NM MYOCAR MULTI W/SPECT W/WALL MOTION / EF
Estimated workload: 1 METS
LV dias vol: 76 mL (ref 46–106)
LV sys vol: 38 mL
MPHR: 138 {beats}/min
Peak HR: 104 {beats}/min
Percent HR: 75 %
RATE: 0.36
Rest HR: 77 {beats}/min
TID: 1.4

## 2019-04-20 MED ORDER — REGADENOSON 0.4 MG/5ML IV SOLN
INTRAVENOUS | Status: AC
  Administered 2019-04-20: 0.4 mg via INTRAVENOUS
  Filled 2019-04-20: qty 5

## 2019-04-20 MED ORDER — TECHNETIUM TC 99M TETROFOSMIN IV KIT
10.0000 | PACK | Freq: Once | INTRAVENOUS | Status: AC | PRN
  Administered 2019-04-20: 10:00:00 10 via INTRAVENOUS

## 2019-04-20 MED ORDER — TECHNETIUM TC 99M TETROFOSMIN IV KIT
30.0000 | PACK | Freq: Once | INTRAVENOUS | Status: AC | PRN
  Administered 2019-04-20: 12:00:00 30 via INTRAVENOUS

## 2019-04-20 MED ORDER — REGADENOSON 0.4 MG/5ML IV SOLN
0.4000 mg | Freq: Once | INTRAVENOUS | Status: AC
  Administered 2019-04-20: 0.4 mg via INTRAVENOUS
  Filled 2019-04-20: qty 5

## 2019-04-20 NOTE — Plan of Care (Signed)
Poc progressing.  

## 2019-04-20 NOTE — Evaluation (Signed)
Occupational Therapy Evaluation Patient Details Name: Norma [name] MRN: [id number] DOB: Feb 09, 1934 Today's Date: 04/20/2019    History of Present Illness 85 y.o. female with a hx of DM type 2, chronic diastolic CHF, HTN, HLD, CVA (now on Plavix), cath 2008 with 30% prox LCx and 70% distal LCx. Also had Takotsubo CM in 2012 and MPI with no ischemia. Echo early in the year showed EF 20-25% in setting of acute CHF but repeat echo on followup was normal.  Now admitted with food impaction and found to be in new onset afib.   Clinical Impression   PTA patient reports needing some assist with ADLs, transferring without assist using RW, and using RW for mobility within her home (w/c in community). Reports having PCA assist 5 days/week from 9am-2pm, and spouse can assist as needed.  She was admitted for above and limited by problem list below, including impaired balance, generalized weakness, and decreased activity tolerance.  She appears near baseline for bathing/dressing and requires min assist for UB ADLs, mod-max assist for LB ADLs, but needs increased assist for toileting (max -total assist) and transfers (mod assist using RW).  She will benefit from continued OT services while admitted and after dc at [facility] level in order to maximize independence and safety/decreased burden of care with ADLs,mobility.      Follow Up Recommendations  Home health OT;Supervision/Assistance - 24 hour    Equipment Recommendations  None recommended by OT    Recommendations for Other Services       Precautions / Restrictions Precautions Precautions: Fall Restrictions Weight Bearing Restrictions: No      Mobility Bed Mobility Overal bed mobility: Needs Assistance Bed Mobility: Supine to Sit     Supine to sit: Mod assist     General bed mobility comments: Initiates movement of LEs but needed assist for elevation of trunk and use of pad to scoot to EOB.   Transfers Overall transfer level: Needs  assistance Equipment used: Rolling walker (2 wheeled) Transfers: Sit to/from [facility] Sit to Stand: Mod assist Stand pivot transfers: Min assist       General transfer comment: mod assist to power up from EOB, once fully standing with cueing for anterior weightshifting pt able to pivot using RW with min assist given increased time and support for balance     Balance Overall balance assessment: Needs assistance Sitting-balance support: No upper extremity supported;Feet supported Sitting balance-Leahy Scale: Fair Sitting balance - Comments: verbal cueing to avoid posterior leaning at EOB    Standing balance support: Bilateral upper extremity supported;During functional activity Standing balance-Leahy Scale: Poor Standing balance comment: reliant on B UE and external support                           ADL either performed or assessed with clinical judgement   ADL Overall ADL's : Needs assistance/impaired     Grooming: Sitting;Minimal assistance   Upper Body Bathing: Minimal assistance;Sitting   Lower Body Bathing: Moderate assistance;Sit to/from stand   Upper Body Dressing : Minimal assistance;Sitting   Lower Body Dressing: Sit to/from stand;Maximal assistance   Toilet Transfer: Moderate assistance;Stand-pivot;RW Toilet Transfer Details (indicate cue type and reason): simulated to recliner  Toileting- Clothing Manipulation and Hygiene: Total assistance;Sit to/from stand       Functional mobility during ADLs: Moderate assistance;Rolling walker General ADL Comments: pt near baseline for bathing/dressing, increased assist required for transfers and toileting; limited by generalized weakness and impaired  balance      Vision         Perception     Praxis      Pertinent Vitals/Pain Pain Assessment: Faces Faces Pain Scale: Hurts little more Pain Location: B LES Pain Descriptors / Indicators: Discomfort Pain Intervention(s): Monitored  during session;Repositioned     Hand Dominance Right   Extremity/Trunk Assessment Upper Extremity Assessment Upper Extremity Assessment: Generalized weakness(limited shoulder FF due to arthritis)   Lower Extremity Assessment Lower Extremity Assessment: Defer to PT evaluation   Cervical / Trunk Assessment Cervical / Trunk Assessment: Kyphotic   Communication Communication Communication: No difficulties   Cognition Arousal/Alertness: Awake/alert Behavior During Therapy: Flat affect Overall Cognitive Status: Within Functional Limits for tasks assessed                                     General Comments  VSS    Exercises     Shoulder Instructions      Home Living Family/patient expects to be discharged to:: Private residence Living Arrangements: Spouse/significant other Available Help at Discharge: Family;Available 24 hours/day;Personal care attendant Type of Home: House Home Access: Ramped entrance     Home Layout: One level     Bathroom Shower/Tub: Teacher, early years/pre: Standard     Home Equipment: Environmental consultant - 2 wheels;Bedside commode;Shower seat;Wheelchair - manual   Additional Comments: reports PCA M-F 9am-2pm       Prior Functioning/Environment Level of Independence: Needs assistance  Gait / Transfers Assistance Needed: needs walker for inside the house uses wheelchair outside with spouse assist as needed ADL's / Homemaking Assistance Needed: patient reports PCA/spouse assists as needed, min assist for UB and mod-max for LB; could toilet with supervision            OT Problem List: Decreased strength;Decreased activity tolerance;Impaired balance (sitting and/or standing);Decreased knowledge of use of DME or AE;Obesity;Cardiopulmonary status limiting activity;Decreased range of motion      OT Treatment/Interventions: Self-care/ADL training;DME and/or AE instruction;Therapeutic exercise;Therapeutic activities;Patient/family  education;Balance training    OT Goals(Current goals can be found in the care plan section) Acute Rehab OT Goals Patient Stated Goal: to go home OT Goal Formulation: With patient Time For Goal Achievement: 05/04/19 Potential to Achieve Goals: Good  OT Frequency: Min 2X/week   Barriers to D/C:            Co-evaluation              AM-PAC OT "6 Clicks" Daily Activity     Outcome Measure Help from another person eating meals?: A Little Help from another person taking care of personal grooming?: A Little Help from another person toileting, which includes using toliet, bedpan, or urinal?: A Lot Help from another person bathing (including washing, rinsing, drying)?: A Lot Help from another person to put on and taking off regular upper body clothing?: A Little Help from another person to put on and taking off regular lower body clothing?: A Lot 6 Click Score: 15   End of Session Equipment Utilized During Treatment: Gait belt;Rolling walker Nurse Communication: Mobility status;Precautions  Activity Tolerance: Patient tolerated treatment well Patient left: in chair;with call bell/phone within reach;with chair alarm set  OT Visit Diagnosis: Other abnormalities of gait and mobility (R26.89);Unsteadiness on feet (R26.81);Muscle weakness (generalized) (M62.81)                Time: 6720-9470 OT Time Calculation (min):  27 min Charges:  OT General Charges $OT Visit: 1 Visit OT Evaluation $OT Eval Moderate Complexity: 1 Mod OT Treatments $Self Care/Home Management : 8-22 mins  Delight Stare, OT Acute Rehabilitation Services Pager [phone] Office [phone]   Delight Stare 04/20/2019, 5:15 PM

## 2019-04-20 NOTE — Plan of Care (Signed)

## 2019-04-20 NOTE — Progress Notes (Signed)
OT Cancellation Note  Patient Details Name: Norma [name] MRN: [id number] DOB: 06/20/1934   Cancelled Treatment:    Reason Eval/Treat Not Completed: Patient at procedure or test/ unavailable, pt off unit at nuclear med per RN. Will follow and initiate OT eval when able.  Delight Stare, OT Acute Rehabilitation Services Pager [phone] Office [phone]    Delight Stare 04/20/2019, 10:18 AM

## 2019-04-20 NOTE — Progress Notes (Signed)
   Marcheta Grammes presented for a lexiscan cardiolite today.  No immediate complications.  Stress imaging is pending at this time.  Reino Bellis, NP 04/20/2019, 11:14 AM

## 2019-04-20 NOTE — Progress Notes (Signed)
Norma [name] was transported to Nuclear medicine.

## 2019-04-20 NOTE — Progress Notes (Signed)
Received call from [place], [place].  Results of stress test showed no major abnormalities.  Follow up appointment made for the A-fib clinic.  Ok to give results to the patient.

## 2019-04-20 NOTE — Progress Notes (Addendum)
Nuc reviewed per Dr. Harrell Gave "Imaging was challenging but no clear ischemia." Discussed with Dr. Radford Pax who does not feel any further ischemic work-up as needed. Will follow-up with afib clinic August 19 @ 10:30. She will need 3-4 weeks of eliquis before Cardioversion.   Attempted to call patient with results but discussed with nurse.   Loreli Debruler Kathlen Mody, PA-C

## 2019-04-20 NOTE — Progress Notes (Signed)
Nuclear stress test reviewed.  Poor quality study.  There was a large defect of moderate severity present in the basal anteroseptal, basal inferoseptal, basal inferior, mid anteroseptal, mid inferoseptal, mid inferior, apical septal, apical inferior and apex location.  Uptake is low on both rest and stress but only wall motion was in the basal and mid septum and was mild.  Apex had decreased counts but normal wall motion making infarct much less likely.  No typical pattern consistent with ischemia.    She has not had any chest pain and came in with a food impaction.  She has some mild T wave inversions in the anteroseptal leads with mid and basal antero and inferoseptal walls.  No large defect and given no anginal sx will not pursue further workup at this time. Followup with primary cardiologist.  If she develops CP then consider cardiac cath or coronary CTA.  Recommend followup of afib in afib clinic and will set up for primary cardiologist in [place].

## 2019-04-20 NOTE — Progress Notes (Signed)
Inpatient Diabetes Program Recommendations  AACE/ADA: New Consensus Statement on Inpatient Glycemic Control (2015)  Target Ranges:  Prepandial:   less than 140 mg/dL      Peak postprandial:   less than 180 mg/dL (1-2 hours)      Critically ill patients:  140 - 180 mg/dL   Lab Results  Component Value Date   GLUCAP 210 (H) 04/20/2019   HGBA1C 7.9 (H) 04/18/2019    Review of Glycemic Control Results for Norma [name], Norma [name] (MRN [id number]) as of 04/20/2019 13:25  Ref. Range 04/19/2019 11:24 04/19/2019 16:18 04/19/2019 21:12 04/20/2019 06:08 04/20/2019 12:10  Glucose-Capillary Latest Ref Range: 70 - 99 mg/dL 198 (H) 257 (H) 193 (H) 167 (H) 210 (H)   Diabetes history: DM 2 Outpatient Diabetes medications:  Glipizide 2.5 mg daily, Levemir 5-22 units daily Current orders for Inpatient glycemic control:  Novolog sensitive tid w/ meals  Inpatient Diabetes Program Recommendations:   May consider adding Levemir 10 units daily.   Thanks  Adah Perl, RN, BC-ADM Inpatient Diabetes Coordinator Pager [phone] (8a-5p)

## 2019-04-20 NOTE — Progress Notes (Signed)
PROGRESS NOTE  Norma [name] SVX:[id number] DOB: 1934-05-21 DOA: 04/17/2019 PCP: Caryl Bis, MD  Brief History   85 year old female with history of diabetes type 2, chronic pain, congestive heart failure, hypertension, history of a stroke with right-sided hemiparesis presented to the emergency room because of difficulty swallowing and food stuck on her throat after eating steak for lunch.  She was brought to endoscopy suite and was found with A. fib RVR.  Underwent EGD and impacted food on esophagus was pushed to the stomach with improvement with some superficial bleeding.  Because of A. fib with RVR, started on metoprolol and admitted to the hospital.  Seen by cardiology. The patient underwent a Lexiscan stress today that was negative for reversible ischemia.  Consultants  . Cardiology . Gastroenterology  Procedures  . EGD for removal of food impaction.  Antibiotics   Anti-infectives (From admission, onward)   None    .   Subjective  The patient is resting comfortably. No new complaints.  Objective   Vitals:  Vitals:   04/20/19 1208 04/20/19 2002  BP: (!) 148/82 (!) 150/93  Pulse: 70 72  Resp: (!) 21 16  Temp: 97.6 F (36.4 C) 98.3 F (36.8 C)  SpO2: 97% 97%    Exam:  Constitutional:  . The patient is resting comfortably. No new complaints. Respiratory:  . No increased work of breathing. . No wheezes, rales, or rhonchi. . No tactile fremitus. Cardiovascular:  . Regular rate and rhythm . No murmurs, ectopy, or gallups. . No lateral PMI. No thrills. Abdomen:  . Abdomen is soft, non-tender, non-distended. . No hernias, masses, or organomegaly. Musculoskeletal:  . No cyanosis, clubbing, or edema. Skin:  . No rashes, lesions, ulcers . palpation of skin: no induration or nodules Neurologic:  . CN 2-12 intact . Sensation all 4 extremities intact Psychiatric:  . Mental status o Mood, affect appropriate o Orientation to person, place, time  . judgment  and insight appear intact   I have personally reviewed the following:   Today's Data  . Vitals, CBC, BMP  Cardiology Data  . Lexiscan stress: Negative for reversible ischemia  Scheduled Meds: . apixaban  5 mg Oral BID  . buPROPion  150 mg Oral Daily  . carvedilol  6.25 mg Oral BID WC  . DULoxetine  60 mg Oral BID  . insulin aspart  0-9 Units Subcutaneous TID WC  . pantoprazole  40 mg Oral BID  . simvastatin  20 mg Oral QPM   Continuous Infusions:  Principal Problem:   Atrial fibrillation with RVR (HCC) Active Problems:   Dilated cardiomyopathy (HCC)   Pulmonary hypertension (HCC)   Chronic kidney disease (CKD), stage III (moderate) (HCC)   CAD (coronary artery disease)   Uncontrolled type 2 diabetes mellitus with hyperglycemia, with long-term current use of insulin (HCC)   History of completed stroke   Food impaction of esophagus   New onset a-fib ([place])   Chronic diastolic heart failure ([place])   LOS: 2 days   A & P  New onset A. fib with RVR: Remains in persistent A. fib.  Currently rate controlled.  TSH normal. Echocardiogram revealed cardiomyopathy. Heart rate is stable on beta-blockers. Cardiology following closely. Chads VASC 2 score 8 with history of diabetes and stroke.  Patient has been started on Eliquis and aspirin DC home dose Plavix   Esophageal dysphagia: She has suffered recurrent issues with food impaction: Patient is stay on mechanical soft diet.  Tolerating liquid diet.  Will  advance to mechanical soft diet. Currently stable tolerating diet  Dilated cardiomyopathy with history of stress cardiomyopathy: Currently euvolemic. Stable  Type 2 diabetes: On insulin that she will continue.  History of a stroke: Severe generalized weaknesses due to previous stroke. She has been on Plavix which has now been switched to aspirin and Eliquis. Continue beta-blocker and statins. Neurologically stable. deficit.  I have seen and examined this patient myself. I  have spent 32 minutes in her evaluation and care.  DVT prophylaxis: SCDs Code Status: Full code Family Communication: None Disposition Plan: Home.  Anticipate tomorrow  Earnestine Tuohey, DO [facility] Direct contact: see [email or web address]  7PM-7AM contact night coverage as above 04/20/2019, 8:44 PM  LOS: 2 days

## 2019-04-20 NOTE — Progress Notes (Signed)
Received Norma [name] from [facility]

## 2019-04-21 LAB — GLUCOSE, CAPILLARY
Glucose-Capillary: 181 mg/dL — ABNORMAL HIGH (ref 70–99)
Glucose-Capillary: 234 mg/dL — ABNORMAL HIGH (ref 70–99)
Glucose-Capillary: 220 mg/dL — ABNORMAL HIGH (ref 70–99)

## 2019-04-21 MED ORDER — INSULIN DETEMIR 100 UNIT/ML ~~LOC~~ SOLN: 10.0000 [IU] | Freq: Every day | SUBCUTANEOUS | 11 refills | Status: AC

## 2019-04-21 MED ORDER — APIXABAN 5 MG PO TABS: 5.0000 mg | ORAL_TABLET | Freq: Two times a day (BID) | ORAL | 0 refills | Status: DC

## 2019-04-21 NOTE — Progress Notes (Signed)
FYI Norma [name] received request from Dr Radford Pax for additional f/u in [facility] - she sent message to schedulers to arrange and call pt with information. Please call with questions.

## 2019-04-21 NOTE — Progress Notes (Signed)
Physical Therapy Treatment Patient Details Name: Norma [name] MRN: [id number] DOB: 1933-10-18 Today's Date: 04/21/2019    History of Present Illness 85 yo female admitted with food impaction and found to have new onset Afib. PMhx: DM, CHF, HTN, HLD, CVA, takotsubo CM    PT Comments    Pt agreeable to get OOB to chair with decreased assist for transfer required this session with pt declining further ambulation or exercise due to lunch arrival. Pt encouraged to be OOB daily with nursing staff and pt continues to state spouse can provide physical assist for transfers at home. Will continue to follow.  HR 92-115 with limited mobility   Follow Up Recommendations  Home health PT;Supervision/Assistance - 24 hour     Equipment Recommendations  None recommended by PT    Recommendations for Other Services       Precautions / Restrictions Precautions Precautions: Fall    Mobility  Bed Mobility Overal bed mobility: Needs Assistance Bed Mobility: Supine to Sit     Supine to sit: Mod assist     General bed mobility comments: Initiates movement of LEs but needed assist for elevation of trunk and use of pad to scoot to EOB with HOB 30 degrees and increased time  Transfers Overall transfer level: Needs assistance   Transfers: Sit to/from Stand;Stand Pivot Transfers Sit to Stand: Min assist Stand pivot transfers: Min assist       General transfer comment: assist to rise from surface with cues for hand placement and anterior translation. Pt with use of RW to walk 3' bed to recliner with slight assist for balance  Ambulation/Gait             General Gait Details: pt declined due to lunch arrival   Stairs             Wheelchair Mobility    Modified Rankin (Stroke Patients Only)       Balance Overall balance assessment: Needs assistance Sitting-balance support: No upper extremity supported;Feet supported Sitting balance-Leahy Scale: Fair Sitting balance -  Comments: EOB with supervision   Standing balance support: Bilateral upper extremity supported;During functional activity Standing balance-Leahy Scale: Poor Standing balance comment: reliant on B UE and external support                            Cognition Arousal/Alertness: Awake/alert Behavior During Therapy: Flat affect Overall Cognitive Status: Within Functional Limits for tasks assessed                                        Exercises      General Comments        Pertinent Vitals/Pain Pain Assessment: No/denies pain    Home Living                      Prior Function            PT Goals (current goals can now be found in the care plan section) Progress towards PT goals: Progressing toward goals    Frequency           PT Plan Current plan remains appropriate    Co-evaluation              AM-PAC PT "6 Clicks" Mobility   Outcome Measure  Help needed turning from your back to your side  while in a flat bed without using bedrails?: A Little Help needed moving from lying on your back to sitting on the side of a flat bed without using bedrails?: A Lot Help needed moving to and from a bed to a chair (including a wheelchair)?: A Little Help needed standing up from a chair using your arms (e.g., wheelchair or bedside chair)?: A Little Help needed to walk in hospital room?: A Lot Help needed climbing 3-5 steps with a railing? : Total 6 Click Score: 14    End of Session Equipment Utilized During Treatment: Gait belt Activity Tolerance: Patient tolerated treatment well Patient left: in chair;with call bell/phone within reach;with chair alarm set Nurse Communication: Mobility status PT Visit Diagnosis: Muscle weakness (generalized) (M62.81);Unsteadiness on feet (R26.81);Other abnormalities of gait and mobility (R26.89)     Time: 1141-1155 PT Time Calculation (min) (ACUTE ONLY): 14 min  Charges:  $Therapeutic Activity:  8-22 mins                     [place], PT Acute Rehabilitation Services Pager: [phone] Office: [place] 04/21/2019, 1:04 PM

## 2019-04-21 NOTE — Discharge Summary (Signed)
Physician Discharge Summary  Norma [name] [id number]:[id number] DOB: 12-10-1933 DOA: 04/17/2019  PCP: Caryl Bis, MD  Admit date: 04/17/2019 Discharge date: 04/21/2019  Recommendations for Outpatient Follow-up:  1. Home health PT/OT 2. Follow up with Cardiology as directed 3. Follow up with atrial fibrillation clinic. 4. Follow up with PCP in 7-10 days 5. Follow up with GI as directed.  Follow-up Information    [facility] Follow up.   Specialty: Cardiology Why: You have been referred to the Afib clinic with the appointment information below. Before your appointment you can call the clinic number for instructions on directions and parking garage code.  Contact information: [address] [id number] Dan[facility] G[facility] Follow up.   Specialty: Cardiology Why: The [facility] (general cardiology) office will call you to arrange follow-up in either our [place] or [place] office. Contact information: [place] [phone]           Discharge Diagnoses: Principal diagnosis is #1 1. New onset atrial fibrillation with RVR 2. Esophageal dysphagia 3. Dilated cardiomyopathy 4. DM II 5. CKD IV  Discharge Condition: Fair Disposition: Home with home health  Diet recommendation: Heart healthy with modified carbohydrates  Filed Weights   04/17/19 2151  Weight: 68 kg    History of present illness:  Norma [name] is a 85 y.o. female with history of diabetes mellitus type 2, chronic pain, CHF, hypertension, history of stroke with right-sided weakness presented to the ER because of patient had difficulty swallowing after eating steak at lunchtime today.  Patient was brought to the endoscopic suite and over there patient was found to be in A. fib with RVR.  Patient underwent EGD and the foreign body was pushed to the stomach.   And the hospitalist was consulted for admission and management of A. fib with RVR.  At the time of my exam all labs are pending.  Discussed with gastroenterologist who advised at this time to hold off anticoagulation for next 12 hours since patient had mild bleeding from the procedure.  On my exam patient denies any chest pain shortness of breath nausea vomiting palpitations.  Patient states she has never had A. fib before.  Monitor shows A. fib with RVR and was given 2 dose of IV metoprolol 5 mg.  At this time the heart rate is around 100 bpm.  ED Course: Patient was a direct admit from the endoscopy.  Hospital Course:  85 year old female with history of diabetes type 2, chronic pain, congestive heart failure, hypertension, history of a stroke with right-sided hemiparesis presented to the emergency room because of difficulty swallowing and food stuck on her throat after eating steak for lunch. She was brought to endoscopy suite and was found with A. fib RVR. Underwent EGD and impacted food on esophagus was pushed to the stomach with improvement with some superficial bleeding. Because of A. fib with RVR, started on metoprolol and admitted to the hospital. Seen by cardiology. The patient underwent a Lexiscan stress today that was negative for reversible ischemia.  Today's assessment: S: The patient is resting comfortably. No new complaints. O: Vitals:  Vitals:   04/21/19 0455 04/21/19 1125  BP: (!) 163/77 (!) 142/89  Pulse: 92 86  Resp: (!) 21 20  Temp: 98.3 F (36.8 C)   SpO2: 96% 98%   Constitutional:   The patient is resting  comfortably. No new complaints. Respiratory:   No increased work of breathing.  No wheezes, rales, or rhonchi.  No tactile fremitus. Cardiovascular:   Regular rate and rhythm  No murmurs, ectopy, or gallups.  No lateral PMI. No thrills. Abdomen:   Abdomen is soft, non-tender, non-distended.  No hernias, masses, or organomegaly. Musculoskeletal:    No cyanosis, clubbing, or edema. Skin:   No rashes, lesions, ulcers  palpation of skin: no induration or nodules Neurologic:   CN 2-12 intact  Sensation all 4 extremities intact Psychiatric:   Mental status ? Mood, affect appropriate ? Orientation to person, place, time   judgment and insight appear intact    Discharge Instructions  Discharge Instructions    (HEART FAILURE PATIENTS) Call MD:  Anytime you have any of the following symptoms: 1) 3 pound weight gain in 24 hours or 5 pounds in 1 week 2) shortness of breath, with or without a dry hacking cough 3) swelling in the hands, feet or stomach 4) if you have to sleep on extra pillows at night in order to breathe.   Complete by: As directed    Activity as tolerated - No restrictions   Complete by: As directed    Call MD for:   Complete by: As directed    Chest pain, shortness of breath   Diet - low sodium heart healthy   Complete by: As directed    DYS 3 diet. Carbohydrate controlled and 2 gm sodium restriction.   Discharge instructions   Complete by: As directed    Follow up with gastroenterology as directed. Follow up with cardiology as directed. Follow up with PCP in 7-10 days. Call Doctor for more than 3 lbs in weight gain in one day or 5 lbs in weight gain in one week.   Heart Failure patients record your daily weight using the same scale at the same time of day   Complete by: As directed    Increase activity slowly   Complete by: As directed    Increase activity slowly   Complete by: As directed      Allergies as of 04/21/2019   No Known Allergies     Medication List    STOP taking these medications   clopidogrel 75 MG tablet Commonly known as: PLAVIX   Cranberry 300 MG tablet     TAKE these medications   apixaban 5 MG Tabs tablet Commonly known as: ELIQUIS Take 1 tablet (5 mg total) by mouth 2 (two) times daily.   buPROPion 150 MG 24 hr tablet Commonly known as: WELLBUTRIN XL Take 150 mg by  mouth daily.   carvedilol 6.25 MG tablet Commonly known as: COREG Take 1 tablet (6.25 mg total) 2 (two) times daily with a meal by mouth. What changed: Another medication with the same name was removed. Continue taking this medication, and follow the directions you see here.   DULoxetine 60 MG capsule Commonly known as: CYMBALTA Take 60 mg by mouth 2 (two) times daily.   glipiZIDE 2.5 MG 24 hr tablet Commonly known as: GLUCOTROL XL Take 2.5 mg by mouth daily with breakfast.   insulin detemir 100 UNIT/ML injection Commonly known as: LEVEMIR Inject 0.1 mLs (10 Units total) into the skin at bedtime. What changed: how much to take   oxyCODONE-acetaminophen 10-325 MG tablet Commonly known as: PERCOCET Take 1 tablet 4 (four) times daily by mouth.   potassium chloride 10 MEQ tablet Commonly known as: K-DUR Take 10 mEq by mouth daily.  saccharomyces boulardii 250 MG capsule Commonly known as: Florastor Take 1 capsule (250 mg total) 2 (two) times daily by mouth. What changed: when to take this   simvastatin 20 MG tablet Commonly known as: ZOCOR Take 20 mg by mouth every evening.      No Known Allergies  The results of significant diagnostics from this hospitalization (including imaging, microbiology, ancillary and laboratory) are listed below for reference.    Significant Diagnostic Studies: Nm Myocar Multi W/spect W/wall Motion / Ef  Result Date: 04/20/2019  There was no ST segment deviation noted during stress. There was baseline t wave inversion in the precordial anteroseptal leads that did not change with stress.  Defect 1: There is a large defect of moderate severity present in the basal anteroseptal, basal inferoseptal, basal inferior, mid anteroseptal, mid inferoseptal, mid inferior, apical septal, apical inferior and apex location.  This is an intermediate risk study.  The left ventricular ejection fraction is mildly decreased (45-54%).  Nuclear stress EF: 51%.   Findings consistent with prior myocardial infarction with peri-infarct ischemia.  Technically limited study. Appears to be a large area in the anteroseptal/inferoseptal/inferior region that has low uptake at both rest and stress. However, only wall motion abnormality is for mild mid septal hypokinesis at the base. Apex has reduced perfusion at rest but increases with stress. This is not a typical pattern for ischemia. Cannot exclude prior infarct with some peri-infarct ischemia, but there is minimal difference between rest and stress images. Overall not typical for ischemia.   Dg Chest Port 1 View  Result Date: 04/18/2019 CLINICAL DATA:  Cough and history of recent food impaction removed EXAM: PORTABLE CHEST 1 VIEW COMPARISON:  04/17/2019 FINDINGS: Cardiac shadow is enlarged but stable. Aortic calcifications are seen. Lungs are well aerated bilaterally. No focal infiltrate or effusion is seen. No pneumomediastinum is noted. No bony abnormality is seen. IMPRESSION: No acute abnormality noted. Electronically Signed   By: Inez Catalina M.D.   On: 04/18/2019 00:11    Microbiology: Recent Results (from the past 240 hour(s))  SARS Coronavirus 2 [facility] order, Performed in [facility] hospital lab) Nasopharyngeal Nasopharyngeal Swab     Status: None   Collection Time: 04/17/19  6:43 PM   Specimen: Nasopharyngeal Swab  Result Value Ref Range Status   SARS Coronavirus 2 NEGATIVE NEGATIVE Final    Comment: (NOTE) If result is NEGATIVE SARS-CoV-2 target nucleic acids are NOT DETECTED. The SARS-CoV-2 RNA is generally detectable in upper and lower  respiratory specimens during the acute phase of infection. The lowest  concentration of SARS-CoV-2 viral copies this assay can detect is 250  copies / mL. A negative result does not preclude SARS-CoV-2 infection  and should not be used as the sole basis for treatment or other  patient management decisions.  A negative result may occur with  improper specimen  collection / handling, submission of specimen other  than nasopharyngeal swab, presence of viral mutation(s) within the  areas targeted by this assay, and inadequate number of viral copies  (<250 copies / mL). A negative result must be combined with clinical  observations, patient history, and epidemiological information. If result is POSITIVE SARS-CoV-2 target nucleic acids are DETECTED. The SARS-CoV-2 RNA is generally detectable in upper and lower  respiratory specimens dur ing the acute phase of infection.  Positive  results are indicative of active infection with SARS-CoV-2.  Clinical  correlation with patient history and other diagnostic information is  necessary to determine patient infection status.  Positive results do  not rule out bacterial infection or co-infection with other viruses. If result is PRESUMPTIVE POSTIVE SARS-CoV-2 nucleic acids MAY BE PRESENT.   A presumptive positive result was obtained on the submitted specimen  and confirmed on repeat testing.  While 2019 novel coronavirus  (SARS-CoV-2) nucleic acids may be present in the submitted sample  additional confirmatory testing may be necessary for epidemiological  and / or clinical management purposes  to differentiate between  SARS-CoV-2 and other Sarbecovirus currently known to infect humans.  If clinically indicated additional testing with an alternate test  methodology [phone]) is advised. The SARS-CoV-2 RNA is generally  detectable in upper and lower respiratory sp ecimens during the acute  phase of infection. The expected result is Negative. Fact Sheet for Patients:  StrictlyIdeas.no Fact Sheet for Healthcare Providers: BankingDealers.co.za This test is not yet approved or cleared by the [place] FDA and has been authorized for detection and/or diagnosis of SARS-CoV-2 by FDA under an Emergency Use Authorization (EUA).  This EUA will remain in effect  (meaning this test can be used) for the duration of the COVID-19 declaration under Section 564(b)(1) of the Act, 21 U.S.C. section 360bbb-3(b)(1), unless the authorization is terminated or revoked sooner. Performed at [facility], [place] [address] [place], [place] [address]      Labs: Basic Metabolic Panel: Recent Labs  Lab 04/17/19 2039 04/18/19 0030 04/19/19 0525  NA 141 138 138  K 4.6 5.2* 4.5  CL  --  107 107  CO2  --  22 20*  GLUCOSE 191* 228* 181*  BUN  --  25* 22  CREATININE  --  1.39* 1.43*  CALCIUM  --  8.9 9.1  MG  --  2.0  --    Liver Function Tests: Recent Labs  Lab 04/18/19 0030  AST 71*  ALT 60*  ALKPHOS 154*  BILITOT 0.7  PROT 6.5  ALBUMIN 3.3*   No results for input(s): LIPASE, AMYLASE in the last 168 hours. No results for input(s): AMMONIA in the last 168 hours. CBC: Recent Labs  Lab 04/17/19 2039 04/18/19 0030 04/19/19 0525  WBC  --  12.2* 8.7  NEUTROABS  --  10.0* 5.0  HGB 12.9 12.1 11.8*  HCT 38.0 39.2 37.6  MCV  --  90.3 90.8  PLT  --  210 153   Cardiac Enzymes: No results for input(s): CKTOTAL, CKMB, CKMBINDEX, TROPONINI in the last 168 hours. BNP: BNP (last 3 results) No results for input(s): BNP in the last 8760 hours.  ProBNP (last 3 results) No results for input(s): PROBNP in the last 8760 hours.  CBG: Recent Labs  Lab 04/20/19 1627 04/20/19 2144 04/21/19 0657 04/21/19 1121 04/21/19 1650  GLUCAP 212* 203* 181* 234* 220*    Principal Problem:   Atrial fibrillation with RVR (HCC) Active Problems:   Dilated cardiomyopathy (HCC)   Pulmonary hypertension (HCC)   Chronic kidney disease (CKD), stage III (moderate) (HCC)   CAD (coronary artery disease)   Uncontrolled type 2 diabetes mellitus with hyperglycemia, with long-term current use of insulin (HCC)   History of completed stroke   Food impaction of esophagus   New onset a-fib ([place])   Chronic diastolic heart failure ([place])   Time coordinating discharge:  38 minutes  Signed:        Hartlee Amedee, DO [facility]  04/21/2019, 6:27 PM

## 2019-04-21 NOTE — Care Management Important Message (Signed)
Important Message  Patient Details  Name: Norma [name] MRN: [id number] Date of Birth: [birth year]   Medicare Important Message Given:  Yes     Shelda Altes 04/21/2019, 12:11 PM

## 2019-05-04 ENCOUNTER — Encounter (HOSPITAL_COMMUNITY): Payer: Self-pay | Admitting: Physician Assistant

## 2019-05-04 ENCOUNTER — Other Ambulatory Visit: Payer: Self-pay

## 2019-05-04 ENCOUNTER — Ambulatory Visit (HOSPITAL_COMMUNITY)
Admit: 2019-05-04 | Discharge: 2019-05-04 | Disposition: A | Discharge status: Home / Self Care | Payer: Medicare HMO | Source: Ambulatory Visit | Attending: Physician Assistant | Admitting: Physician Assistant

## 2019-05-04 VITALS — BP 140/62 | HR 100 | Ht 62.0 in | Wt 157.0 lb

## 2019-05-04 DIAGNOSIS — I130 Hypertensive heart and chronic kidney disease with heart failure and stage 1 through stage 4 chronic kidney disease, or unspecified chronic kidney disease: Secondary | ICD-10-CM | POA: Diagnosis not present

## 2019-05-04 DIAGNOSIS — Z8249 Family history of ischemic heart disease and other diseases of the circulatory system: Secondary | ICD-10-CM | POA: Insufficient documentation

## 2019-05-04 DIAGNOSIS — Z806 Family history of leukemia: Secondary | ICD-10-CM | POA: Insufficient documentation

## 2019-05-04 DIAGNOSIS — Z79899 Other long term (current) drug therapy: Secondary | ICD-10-CM | POA: Diagnosis not present

## 2019-05-04 DIAGNOSIS — Z794 Long term (current) use of insulin: Secondary | ICD-10-CM | POA: Diagnosis not present

## 2019-05-04 DIAGNOSIS — I5032 Chronic diastolic (congestive) heart failure: Secondary | ICD-10-CM | POA: Diagnosis not present

## 2019-05-04 DIAGNOSIS — Z8673 Personal history of transient ischemic attack (TIA), and cerebral infarction without residual deficits: Secondary | ICD-10-CM | POA: Diagnosis not present

## 2019-05-04 DIAGNOSIS — G894 Chronic pain syndrome: Secondary | ICD-10-CM | POA: Diagnosis not present

## 2019-05-04 DIAGNOSIS — Z7901 Long term (current) use of anticoagulants: Secondary | ICD-10-CM | POA: Diagnosis not present

## 2019-05-04 DIAGNOSIS — N183 Chronic kidney disease, stage 3 (moderate): Secondary | ICD-10-CM | POA: Diagnosis not present

## 2019-05-04 DIAGNOSIS — I4819 Other persistent atrial fibrillation: Secondary | ICD-10-CM | POA: Diagnosis not present

## 2019-05-04 DIAGNOSIS — I4891 Unspecified atrial fibrillation: Secondary | ICD-10-CM | POA: Diagnosis present

## 2019-05-04 DIAGNOSIS — E1122 Type 2 diabetes mellitus with diabetic chronic kidney disease: Secondary | ICD-10-CM | POA: Diagnosis not present

## 2019-05-04 DIAGNOSIS — I2510 Atherosclerotic heart disease of native coronary artery without angina pectoris: Secondary | ICD-10-CM | POA: Insufficient documentation

## 2019-05-04 MED ORDER — CARVEDILOL 12.5 MG PO TABS: ORAL_TABLET | ORAL | 2 refills | Status: DC

## 2019-05-04 NOTE — Progress Notes (Signed)
Primary Care Physician: Caryl Bis, MD Primary Cardiologist: Dr Radford Pax (pending f/u in [place]) Primary Electrophysiologist: none Referring Physician: Dr Caroll Rancher is a 85 y.o. female with a history of DM type 2, chronic diastolic CHF, HTN, HLD, CVA (now on Plavix), cath 2008 with 30% prox LCx and 70% distal LCx, Takotsubo CM in 2012 and MPI with no ischemia, and persistent atrial fibrillation who presents for consultation in the [facility].  The patient was initially diagnosed with atrial fibrillation on 04/17/19 after presenting to the ER with a food impaction. Underwent urgent EGD. Patient was rate controlled on home dose Coreg and started on Eliquis. Repeat echo did shows mildly reduced EF 45-50%. She reports that a week prior to her admission she had generalized weakness and dyspnea with exertion. She denies significant snoring or alcohol use. She remains in afib today and continues to have DOE and fatigue. She is tolerating the Eliquis with no bleeding issues.  Today, she denies symptoms of palpitations, chest pain, orthopnea, PND, lower extremity edema, dizziness, presyncope, syncope, snoring, daytime somnolence, bleeding, or neurologic sequela. The patient is tolerating medications without difficulties and is otherwise without complaint today.    Atrial Fibrillation Risk Factors:  she does not have symptoms or diagnosis of sleep apnea. she does not have a history of rheumatic fever. she does not have a history of alcohol use. The patient does not have a history of early familial atrial fibrillation or other arrhythmias.  she has a BMI of Body mass index is 28.72 kg/m.Marland Kitchen Filed Weights   05/04/19 1113  Weight: 71.2 kg    Family History  Problem Relation Age of Onset  . Heart attack Father 13       MI  . Cancer Father        leukemia  . Cancer Brother   . Heart disease Daughter      Atrial Fibrillation Management history:   Previous antiarrhythmic drugs: none Previous cardioversions: none Previous ablations: none CHADS2VASC score: 8 Anticoagulation history: Eliquis   Past Medical History:  Diagnosis Date  . Anemia of other chronic disease   . CAD (coronary artery disease)    a. cath 2008 - 30% prox Cx, 70% distal Cx treated medically.  . Chronic diastolic CHF (congestive heart failure) ([place])   . Chronic pain syndrome   . CKD (chronic kidney disease), stage III ([place])   . History of blood transfusion 1960's   "when my last child was born" (12/23/2012)  . Hypoxemia   . Other chronic pulmonary heart diseases   . Pneumonia    "when I was little" (12/23/2012)  . Stroke [facility]) ~ 2011   "slowed my walking" (12/23/2012)  . Takotsubo cardiomyopathy    a. 2012 - dx unclear. Cardiolite OK but no cath at the time due to renal insufficiency. EF was 20-25%, but normalized in 2018.  . Type II diabetes mellitus ([place])   . Unspecified pleural effusion    Past Surgical History:  Procedure Laterality Date  . ABDOMINAL HYSTERECTOMY    . APPENDECTOMY    . CATARACT EXTRACTION, BILATERAL    . CHOLECYSTECTOMY    . DILATION AND CURETTAGE OF UTERUS    . ESOPHAGOGASTRODUODENOSCOPY (EGD) WITH PROPOFOL N/A 04/17/2019   Procedure: ESOPHAGOGASTRODUODENOSCOPY (EGD) WITH PROPOFOL;  Surgeon: Otis Brace, MD;  Location: [facility];  Service: Gastroenterology;  Laterality: N/A;  . FOREIGN BODY REMOVAL  04/17/2019   Procedure: FOREIGN BODY REMOVAL;  Surgeon:  Otis Brace, MD;  Location: [facility];  Service: Gastroenterology;;  . PATELLA RECONSTRUCTION Bilateral    "had my knee caps replaced" (12/23/2012)  . TONSILLECTOMY      Current Outpatient Medications  Medication Sig Dispense Refill  . apixaban (ELIQUIS) 5 MG TABS tablet Take 1 tablet (5 mg total) by mouth 2 (two) times daily. 60 tablet 0  . buPROPion (WELLBUTRIN XL) 150 MG 24 hr tablet Take 150 mg by mouth daily.     . carvedilol (COREG) 12.5 MG tablet Take 1  tablet in the AM and 2 tablet in the PM 90 tablet 2  . Cranberry 125 MG TABS Take 125 mg by mouth daily as needed.    . DULoxetine (CYMBALTA) 60 MG capsule Take 60 mg by mouth 2 (two) times daily.     Marland Kitchen glipiZIDE (GLUCOTROL XL) 2.5 MG 24 hr tablet Take 2.5 mg by mouth daily with breakfast.    . insulin detemir (LEVEMIR) 100 UNIT/ML injection Inject 0.1 mLs (10 Units total) into the skin at bedtime. (Patient taking differently: Inject 10-22 Units into the skin at bedtime. ) 10 mL 11  . oxyCODONE-acetaminophen (PERCOCET) 10-325 MG tablet Take 1 tablet 4 (four) times daily by mouth. 12 tablet 0  . potassium chloride (K-DUR) 10 MEQ tablet Take 10 mEq by mouth daily.     Marland Kitchen saccharomyces boulardii (FLORASTOR) 250 MG capsule Take 1 capsule (250 mg total) 2 (two) times daily by mouth. (Patient taking differently: Take 250 mg by mouth daily. ) 21 capsule 0  . simvastatin (ZOCOR) 20 MG tablet Take 20 mg by mouth every evening.      No current facility-administered medications for this encounter.     No Known Allergies  Social History   Socioeconomic History  . Marital status: Married    Spouse name: Not on file  . Number of children: Not on file  . Years of education: Not on file  . Highest education level: Not on file  Occupational History  . Occupation: RETIRED  Social Needs  . Financial resource strain: Not on file  . Food insecurity    Worry: Not on file    Inability: Not on file  . Transportation needs    Medical: Not on file    Non-medical: Not on file  Tobacco Use  . Smoking status: Never Smoker  . Smokeless tobacco: Never Used  Substance and Sexual Activity  . Alcohol use: No  . Drug use: No  . Sexual activity: Never  Lifestyle  . Physical activity    Days per week: Not on file    Minutes per session: Not on file  . Stress: Not on file  Relationships  . Social Herbalist on phone: Not on file    Gets together: Not on file    Attends religious service: Not on  file    Active member of club or organization: Not on file    Attends meetings of clubs or organizations: Not on file    Relationship status: Not on file  . Intimate partner violence    Fear of current or ex partner: Not on file    Emotionally abused: Not on file    Physically abused: Not on file    Forced sexual activity: Not on file  Other Topics Concern  . Not on file  Social History Narrative  . Not on file     ROS- All systems are reviewed and negative except as per the HPI  above.  Physical Exam: Vitals:   05/04/19 1113  BP: 140/62  Pulse: 100  Weight: 71.2 kg  Height: 5\' 2"  (1.575 m)    GEN- The patient is well appearing elderly female, alert and oriented x 3 today.   Head- normocephalic, atraumatic Eyes-  Sclera clear, conjunctiva pink Ears- hearing intact Oropharynx- clear Neck- supple  Lungs- Clear to ausculation bilaterally, normal work of breathing Heart- irregular rate and rhythm, no murmurs, rubs or gallops  GI- soft, NT, ND, + BS Extremities- no clubbing, cyanosis. Trace bilateral edema MS- no significant deformity or atrophy Skin- no rash or lesion Psych- euthymic mood, full affect Neuro- strength and sensation are intact  Wt Readings from Last 3 Encounters:  05/04/19 71.2 kg  04/17/19 68 kg  08/09/18 80 kg    EKG today demonstrates afib HR 100, QRS 92, QTc 472  Echo 04/18/19 demonstrated   1. The left ventricle has mildly reduced systolic function, with an ejection fraction of 45-50%. The cavity size was normal. There is mildly increased left ventricular wall thickness. Left ventricular diastolic Doppler parameters are indeterminate.  Hypokinesis of the mid anteroseptal and mid inferoseptal wall segments, unusual pattern.  2. The right ventricle has mildly reduced systolic function. The cavity was mildly enlarged. There is no increase in right ventricular wall thickness.  3. Left atrial size was moderately dilated.  4. Right atrial size was mildly  dilated.  5. There is mild mitral annular calcification present. No evidence of mitral valve stenosis. Mild mitral regurgitation.  6. Tricuspid valve regurgitation is mild-moderate.  7. The aortic valve is tricuspid. Mild calcification of the aortic valve. No stenosis of the aortic valve.  8. The aorta is normal in size and structure.  9. The inferior vena cava was normal in size with <50% respiratory variability. PA systolic pressure 31 mmHg. 10. The patient was in atrial fibrillation.  Epic records are reviewed at length today  Assessment and Plan:  1. Persistent atrial fibrillation Patient remains in atrial fibrillation.  General education about afib provided and questions answered.  We also discussed her stroke risk and the risks and benefits of anticoagulation. We discussed therapeutic options including DCCV. Patient would like to defer for now. Explained that her afib is likely contributing to her symptoms and lower EF. Patient voices understanding. She wants to wait a couple weeks before considering DCCV. Continue Eliquis 5 mg BID. Stressed importance of not missing doses.  Increase Coreg to 12.5mg  AM and 25mg  PM for better rate control.  This patients CHA2DS2-VASc Score and unadjusted Ischemic Stroke Rate (% per year) is equal to 10.8 % stroke rate/year from a score of 8  Above score calculated as 1 point each if present [CHF, HTN, DM, Vascular=MI/PAD/Aortic Plaque, Age if 65-74, or Female] Above score calculated as 2 points each if present [Age > 75, or Stroke/TIA/TE]  2. CAD LHC 2008 30% prox Cx, 70% distal Cx, no PCI at that time. No anginal symptoms. Continue present therapy.  3. HTN Stable, med changes as above.  4. Chronic diastolic CHF/mildly reduced EF EF 45-50% on last echo. No signs or symptoms of fluid overload. Continue BB as above. Would consider repeat echo in [place].    Follow up in the AF clinic in two weeks. Will plan to refer to cardiology in [facility] for long  term follow up.   [facility] [address] [place], [place] [address] [phone] 05/04/2019 12:08 PM

## 2019-05-04 NOTE — Patient Instructions (Signed)
Increase coreg to 12.5mg  in the AM and 25mg  in the PM (2 of your 12.5mg  tabs in the PM)

## 2019-05-16 DIAGNOSIS — E782 Mixed hyperlipidemia: Secondary | ICD-10-CM | POA: Diagnosis not present

## 2019-05-16 DIAGNOSIS — I10 Essential (primary) hypertension: Secondary | ICD-10-CM | POA: Diagnosis not present

## 2019-05-18 ENCOUNTER — Ambulatory Visit (HOSPITAL_COMMUNITY): Payer: Medicare HMO | Admitting: Physician Assistant

## 2019-05-26 ENCOUNTER — Other Ambulatory Visit: Payer: Self-pay

## 2019-05-26 ENCOUNTER — Ambulatory Visit (HOSPITAL_COMMUNITY)
Admission: RE | Admit: 2019-05-26 | Discharge: 2019-05-26 | Disposition: A | Discharge status: Home / Self Care | Payer: Medicare HMO | Source: Ambulatory Visit | Attending: Physician Assistant | Admitting: Physician Assistant

## 2019-05-26 ENCOUNTER — Encounter (HOSPITAL_COMMUNITY): Payer: Self-pay | Admitting: Physician Assistant

## 2019-05-26 VITALS — BP 120/62 | HR 88 | Ht 62.0 in | Wt 147.0 lb

## 2019-05-26 DIAGNOSIS — Z794 Long term (current) use of insulin: Secondary | ICD-10-CM | POA: Diagnosis not present

## 2019-05-26 DIAGNOSIS — Z79899 Other long term (current) drug therapy: Secondary | ICD-10-CM | POA: Diagnosis not present

## 2019-05-26 DIAGNOSIS — I5032 Chronic diastolic (congestive) heart failure: Secondary | ICD-10-CM | POA: Diagnosis not present

## 2019-05-26 DIAGNOSIS — Z7901 Long term (current) use of anticoagulants: Secondary | ICD-10-CM | POA: Diagnosis not present

## 2019-05-26 DIAGNOSIS — E785 Hyperlipidemia, unspecified: Secondary | ICD-10-CM | POA: Insufficient documentation

## 2019-05-26 DIAGNOSIS — I2510 Atherosclerotic heart disease of native coronary artery without angina pectoris: Secondary | ICD-10-CM | POA: Insufficient documentation

## 2019-05-26 DIAGNOSIS — N183 Chronic kidney disease, stage 3 (moderate): Secondary | ICD-10-CM | POA: Diagnosis not present

## 2019-05-26 DIAGNOSIS — I4819 Other persistent atrial fibrillation: Secondary | ICD-10-CM | POA: Insufficient documentation

## 2019-05-26 DIAGNOSIS — E1122 Type 2 diabetes mellitus with diabetic chronic kidney disease: Secondary | ICD-10-CM | POA: Insufficient documentation

## 2019-05-26 DIAGNOSIS — Z8673 Personal history of transient ischemic attack (TIA), and cerebral infarction without residual deficits: Secondary | ICD-10-CM | POA: Diagnosis not present

## 2019-05-26 DIAGNOSIS — I130 Hypertensive heart and chronic kidney disease with heart failure and stage 1 through stage 4 chronic kidney disease, or unspecified chronic kidney disease: Secondary | ICD-10-CM | POA: Diagnosis not present

## 2019-05-26 DIAGNOSIS — Z8249 Family history of ischemic heart disease and other diseases of the circulatory system: Secondary | ICD-10-CM | POA: Diagnosis not present

## 2019-05-26 DIAGNOSIS — G894 Chronic pain syndrome: Secondary | ICD-10-CM | POA: Insufficient documentation

## 2019-05-26 LAB — BASIC METABOLIC PANEL WITH GFR
Anion gap: 7 (ref 5–15)
BUN: 19 mg/dL (ref 8–23)
CO2: 24 mmol/L (ref 22–32)
Calcium: 8.8 mg/dL — ABNORMAL LOW (ref 8.9–10.3)
Chloride: 108 mmol/L (ref 98–111)
Creatinine, Ser: 1.24 mg/dL — ABNORMAL HIGH (ref 0.44–1.00)
GFR calc Af Amer: 47 mL/min — ABNORMAL LOW
GFR calc non Af Amer: 40 mL/min — ABNORMAL LOW
Glucose, Bld: 95 mg/dL (ref 70–99)
Potassium: 4.7 mmol/L (ref 3.5–5.1)
Sodium: 139 mmol/L (ref 135–145)

## 2019-05-26 LAB — CBC
HCT: 40.5 % (ref 36.0–46.0)
Hemoglobin: 12.7 g/dL (ref 12.0–15.0)
MCH: 29.1 pg (ref 26.0–34.0)
MCHC: 31.4 g/dL (ref 30.0–36.0)
MCV: 92.9 fL (ref 80.0–100.0)
Platelets: 236 K/uL (ref 150–400)
RBC: 4.36 MIL/uL (ref 3.87–5.11)
RDW: 13.8 % (ref 11.5–15.5)
WBC: 9.4 K/uL (ref 4.0–10.5)
nRBC: 0 % (ref 0.0–0.2)

## 2019-05-26 NOTE — H&P (View-Only) (Signed)
Primary Care Physician: Caryl Bis, MD Primary Cardiologist: Dr Radford Pax (pending f/u in [place]) Primary Electrophysiologist: none Referring Physician: Dr Caroll Rancher is a 85 y.o. female with a history of DM type 2, chronic diastolic CHF, HTN, HLD, CVA (now on Plavix), cath 2008 with 30% prox LCx and 70% distal LCx, Takotsubo CM in 2012 and MPI with no ischemia, and persistent atrial fibrillation who presents for consultation in the [facility].  The patient was initially diagnosed with atrial fibrillation on 04/17/19 after presenting to the ER with a food impaction. Underwent urgent EGD. Patient was rate controlled on home dose Coreg and started on Eliquis. Repeat echo did shows mildly reduced EF 45-50%. She reports that a week prior to her admission she had generalized weakness and dyspnea with exertion. She denies significant snoring or alcohol use.   On follow up today, patient reports that she remains SOB and fatigued. She has intermittent palpitations. She remains in rate controlled afib today.   Today, she denies symptoms of chest pain, orthopnea, PND, lower extremity edema, dizziness, presyncope, syncope, snoring, daytime somnolence, bleeding, or neurologic sequela. The patient is tolerating medications without difficulties and is otherwise without complaint today.    Atrial Fibrillation Risk Factors:  she does not have symptoms or diagnosis of sleep apnea. she does not have a history of rheumatic fever. she does not have a history of alcohol use. The patient does not have a history of early familial atrial fibrillation or other arrhythmias.  she has a BMI of Body mass index is 26.89 kg/m.Marland Kitchen Filed Weights   05/26/19 1118  Weight: 66.7 kg    Family History  Problem Relation Age of Onset   Heart attack Father 36       MI   Cancer Father        leukemia   Cancer Brother    Heart disease Daughter      Atrial Fibrillation  Management history:  Previous antiarrhythmic drugs: none Previous cardioversions: none Previous ablations: none CHADS2VASC score: 8 Anticoagulation history: Eliquis   Past Medical History:  Diagnosis Date   Anemia of other chronic disease    CAD (coronary artery disease)    a. cath 2008 - 30% prox Cx, 70% distal Cx treated medically.   Chronic diastolic CHF (congestive heart failure) ([facility])    Chronic pain syndrome    CKD (chronic kidney disease), stage III ([place])    History of blood transfusion 1960's   "when my last child was born" (12/23/2012)   Hypoxemia    Other chronic pulmonary heart diseases    Pneumonia    "when I was little" (12/23/2012)   Stroke [facility]) ~ 2011   "slowed my walking" (12/23/2012)   Takotsubo cardiomyopathy    a. 2012 - dx unclear. Cardiolite OK but no cath at the time due to renal insufficiency. EF was 20-25%, but normalized in 2018.   Type II diabetes mellitus ([facility])    Unspecified pleural effusion    Past Surgical History:  Procedure Laterality Date   ABDOMINAL HYSTERECTOMY     APPENDECTOMY     CATARACT EXTRACTION, BILATERAL     CHOLECYSTECTOMY     DILATION AND CURETTAGE OF UTERUS     ESOPHAGOGASTRODUODENOSCOPY (EGD) WITH PROPOFOL N/A 04/17/2019   Procedure: ESOPHAGOGASTRODUODENOSCOPY (EGD) WITH PROPOFOL;  Surgeon: Otis Brace, MD;  Location: [facility];  Service: Gastroenterology;  Laterality: N/A;   FOREIGN BODY REMOVAL  04/17/2019   Procedure: FOREIGN  BODY REMOVAL;  Surgeon: Otis Brace, MD;  Location: [facility];  Service: Gastroenterology;;   PATELLA RECONSTRUCTION Bilateral    "had my knee caps replaced" (12/23/2012)   TONSILLECTOMY      Current Outpatient Medications  Medication Sig Dispense Refill   apixaban (ELIQUIS) 5 MG TABS tablet Take 1 tablet (5 mg total) by mouth 2 (two) times daily. 60 tablet 0   buPROPion (WELLBUTRIN XL) 150 MG 24 hr tablet Take 150 mg by mouth daily.      carvedilol (COREG)  12.5 MG tablet Take 1 tablet in the AM and 2 tablet in the PM 90 tablet 2   Cranberry 125 MG TABS Take 125 mg by mouth daily as needed.     DULoxetine (CYMBALTA) 60 MG capsule Take 60 mg by mouth 2 (two) times daily.      glipiZIDE (GLUCOTROL XL) 2.5 MG 24 hr tablet Take 2.5 mg by mouth daily with breakfast.     insulin detemir (LEVEMIR) 100 UNIT/ML injection Inject 0.1 mLs (10 Units total) into the skin at bedtime. (Patient taking differently: Inject 10-22 Units into the skin at bedtime. ) 10 mL 11   oxyCODONE-acetaminophen (PERCOCET) 10-325 MG tablet Take 1 tablet 4 (four) times daily by mouth. 12 tablet 0   potassium chloride (K-DUR) 10 MEQ tablet Take 10 mEq by mouth daily.      saccharomyces boulardii (FLORASTOR) 250 MG capsule Take 1 capsule (250 mg total) 2 (two) times daily by mouth. (Patient taking differently: Take 250 mg by mouth daily. ) 21 capsule 0   simvastatin (ZOCOR) 20 MG tablet Take 20 mg by mouth every evening.      No current facility-administered medications for this encounter.     No Known Allergies  Social History   Socioeconomic History   Marital status: Married    Spouse name: Not on file   Number of children: Not on file   Years of education: Not on file   Highest education level: Not on file  Occupational History   Occupation: RETIRED  Social Needs   Financial resource strain: Not on file   Food insecurity    Worry: Not on file    Inability: Not on file   Transportation needs    Medical: Not on file    Non-medical: Not on file  Tobacco Use   Smoking status: Never Smoker   Smokeless tobacco: Never Used  Substance and Sexual Activity   Alcohol use: No   Drug use: No   Sexual activity: Never  Lifestyle   Physical activity    Days per week: Not on file    Minutes per session: Not on file   Stress: Not on file  Relationships   Social connections    Talks on phone: Not on file    Gets together: Not on file    Attends  religious service: Not on file    Active member of club or organization: Not on file    Attends meetings of clubs or organizations: Not on file    Relationship status: Not on file   Intimate partner violence    Fear of current or ex partner: Not on file    Emotionally abused: Not on file    Physically abused: Not on file    Forced sexual activity: Not on file  Other Topics Concern   Not on file  Social History Narrative   Not on file     ROS- All systems are reviewed and negative except  as per the HPI above.  Physical Exam: Vitals:   05/26/19 1118  BP: 120/62  Pulse: 88  Weight: 66.7 kg  Height: 5\' 2"  (1.575 m)    GEN- The patient is well appearing elderly female, alert and oriented x 3 today.   HEENT-head normocephalic, atraumatic, sclera clear, conjunctiva pink, hearing intact, trachea midline. Lungs- Clear to ausculation bilaterally, normal work of breathing Heart- irregular rate and rhythm, no murmurs, rubs or gallops  GI- soft, NT, ND, + BS Extremities- no clubbing, cyanosis, or edema MS- no significant deformity or atrophy Skin- no rash or lesion Psych- euthymic mood, full affect Neuro- strength and sensation are intact   Wt Readings from Last 3 Encounters:  05/26/19 66.7 kg  05/04/19 71.2 kg  04/17/19 68 kg    EKG today demonstrates afib HR 88, QRS 90, QTc 491  Echo 04/18/19 demonstrated   1. The left ventricle has mildly reduced systolic function, with an ejection fraction of 45-50%. The cavity size was normal. There is mildly increased left ventricular wall thickness. Left ventricular diastolic Doppler parameters are indeterminate.  Hypokinesis of the mid anteroseptal and mid inferoseptal wall segments, unusual pattern.  2. The right ventricle has mildly reduced systolic function. The cavity was mildly enlarged. There is no increase in right ventricular wall thickness.  3. Left atrial size was moderately dilated.  4. Right atrial size was mildly  dilated.  5. There is mild mitral annular calcification present. No evidence of mitral valve stenosis. Mild mitral regurgitation.  6. Tricuspid valve regurgitation is mild-moderate.  7. The aortic valve is tricuspid. Mild calcification of the aortic valve. No stenosis of the aortic valve.  8. The aorta is normal in size and structure.  9. The inferior vena cava was normal in size with <50% respiratory variability. PA systolic pressure 31 mmHg. 10. The patient was in atrial fibrillation.  Epic records are reviewed at length today  Assessment and Plan:  1. Persistent atrial fibrillation  We discussed therapeutic options including DCCV, amiodarone, or rate control. Would not use class IC with CAD history or sotalol or Tikosyn with baseline QT 472 in SR.  Patient and family would like to pursue DCCV at this time. Will arrange. Check Bmet/CBC today.  Continue Eliquis 5 mg BID. Patient denies any missed doses.  Continue Coreg to 12.5mg  AM and 25mg  PM   This patients CHA2DS2-VASc Score and unadjusted Ischemic Stroke Rate (% per year) is equal to 10.8 % stroke rate/year from a score of 8  Above score calculated as 1 point each if present [CHF, HTN, DM, Vascular=MI/PAD/Aortic Plaque, Age if 65-74, or Female] Above score calculated as 2 points each if present [Age > 75, or Stroke/TIA/TE]  2. CAD LHC 2008 30% prox Cx, 70% distal Cx, no PCI at that time. No anginal symptoms. Continue present therapy.  3. HTN Stable, no med changes today.  4. Chronic diastolic CHF/mildly reduced EF EF 45-50% on last echo. No signs or symptoms of fluid overload. Weight down from last visit.  Continue Coreg as above. Would consider repeat echo in [place].    Follow up in the AF clinic one week post DCCV. Will also refer to general cardiology in [place].   [facility] [address] [place], [place] [address] [phone] 05/26/2019 11:48 AM

## 2019-05-26 NOTE — Patient Instructions (Signed)
Cardioversion scheduled for Wednesday, September 23rd  - Arrive at the [facility] and go to admitting at [facility] not eat or drink anything after midnight the night prior to your procedure.  - Take all your morning medication except diabetic medications with a sip of water prior to arrival.  - You will not be able to drive home after your procedure.

## 2019-05-26 NOTE — Progress Notes (Signed)
Primary Care Physician: Caryl Bis, MD Primary Cardiologist: Dr Radford Pax (pending f/u in [place]) Primary Electrophysiologist: none Referring Physician: Dr Caroll Rancher is a 85 y.o. female with a history of DM type 2, chronic diastolic CHF, HTN, HLD, CVA (now on Plavix), cath 2008 with 30% prox LCx and 70% distal LCx, Takotsubo CM in 2012 and MPI with no ischemia, and persistent atrial fibrillation who presents for consultation in the [facility].  The patient was initially diagnosed with atrial fibrillation on 04/17/19 after presenting to the ER with a food impaction. Underwent urgent EGD. Patient was rate controlled on home dose Coreg and started on Eliquis. Repeat echo did shows mildly reduced EF 45-50%. She reports that a week prior to her admission she had generalized weakness and dyspnea with exertion. She denies significant snoring or alcohol use.   On follow up today, patient reports that she remains SOB and fatigued. She has intermittent palpitations. She remains in rate controlled afib today.   Today, she denies symptoms of chest pain, orthopnea, PND, lower extremity edema, dizziness, presyncope, syncope, snoring, daytime somnolence, bleeding, or neurologic sequela. The patient is tolerating medications without difficulties and is otherwise without complaint today.    Atrial Fibrillation Risk Factors:  she does not have symptoms or diagnosis of sleep apnea. she does not have a history of rheumatic fever. she does not have a history of alcohol use. The patient does not have a history of early familial atrial fibrillation or other arrhythmias.  she has a BMI of Body mass index is 26.89 kg/m.Marland Kitchen Filed Weights   05/26/19 1118  Weight: 66.7 kg    Family History  Problem Relation Age of Onset   Heart attack Father 21       MI   Cancer Father        leukemia   Cancer Brother    Heart disease Daughter      Atrial Fibrillation  Management history:  Previous antiarrhythmic drugs: none Previous cardioversions: none Previous ablations: none CHADS2VASC score: 8 Anticoagulation history: Eliquis   Past Medical History:  Diagnosis Date   Anemia of other chronic disease    CAD (coronary artery disease)    a. cath 2008 - 30% prox Cx, 70% distal Cx treated medically.   Chronic diastolic CHF (congestive heart failure) (HCC)    Chronic pain syndrome    CKD (chronic kidney disease), stage III ([place])    History of blood transfusion 1960's   "when my last child was born" (12/23/2012)   Hypoxemia    Other chronic pulmonary heart diseases    Pneumonia    "when I was little" (12/23/2012)   Stroke [facility]) ~ 2011   "slowed my walking" (12/23/2012)   Takotsubo cardiomyopathy    a. 2012 - dx unclear. Cardiolite OK but no cath at the time due to renal insufficiency. EF was 20-25%, but normalized in 2018.   Type II diabetes mellitus (HCC)    Unspecified pleural effusion    Past Surgical History:  Procedure Laterality Date   ABDOMINAL HYSTERECTOMY     APPENDECTOMY     CATARACT EXTRACTION, BILATERAL     CHOLECYSTECTOMY     DILATION AND CURETTAGE OF UTERUS     ESOPHAGOGASTRODUODENOSCOPY (EGD) WITH PROPOFOL N/A 04/17/2019   Procedure: ESOPHAGOGASTRODUODENOSCOPY (EGD) WITH PROPOFOL;  Surgeon: Otis Brace, MD;  Location: [address];  Service: Gastroenterology;  Laterality: N/A;   FOREIGN BODY REMOVAL  04/17/2019   Procedure: FOREIGN  BODY REMOVAL;  Surgeon: Otis Brace, MD;  Location: [facility];  Service: Gastroenterology;;   PATELLA RECONSTRUCTION Bilateral    "had my knee caps replaced" (12/23/2012)   TONSILLECTOMY      Current Outpatient Medications  Medication Sig Dispense Refill   apixaban (ELIQUIS) 5 MG TABS tablet Take 1 tablet (5 mg total) by mouth 2 (two) times daily. 60 tablet 0   buPROPion (WELLBUTRIN XL) 150 MG 24 hr tablet Take 150 mg by mouth daily.      carvedilol (COREG)  12.5 MG tablet Take 1 tablet in the AM and 2 tablet in the PM 90 tablet 2   Cranberry 125 MG TABS Take 125 mg by mouth daily as needed.     DULoxetine (CYMBALTA) 60 MG capsule Take 60 mg by mouth 2 (two) times daily.      glipiZIDE (GLUCOTROL XL) 2.5 MG 24 hr tablet Take 2.5 mg by mouth daily with breakfast.     insulin detemir (LEVEMIR) 100 UNIT/ML injection Inject 0.1 mLs (10 Units total) into the skin at bedtime. (Patient taking differently: Inject 10-22 Units into the skin at bedtime. ) 10 mL 11   oxyCODONE-acetaminophen (PERCOCET) 10-325 MG tablet Take 1 tablet 4 (four) times daily by mouth. 12 tablet 0   potassium chloride (K-DUR) 10 MEQ tablet Take 10 mEq by mouth daily.      saccharomyces boulardii (FLORASTOR) 250 MG capsule Take 1 capsule (250 mg total) 2 (two) times daily by mouth. (Patient taking differently: Take 250 mg by mouth daily. ) 21 capsule 0   simvastatin (ZOCOR) 20 MG tablet Take 20 mg by mouth every evening.      No current facility-administered medications for this encounter.     No Known Allergies  Social History   Socioeconomic History   Marital status: Married    Spouse name: Not on file   Number of children: Not on file   Years of education: Not on file   Highest education level: Not on file  Occupational History   Occupation: RETIRED  Social Needs   Financial resource strain: Not on file   Food insecurity    Worry: Not on file    Inability: Not on file   Transportation needs    Medical: Not on file    Non-medical: Not on file  Tobacco Use   Smoking status: Never Smoker   Smokeless tobacco: Never Used  Substance and Sexual Activity   Alcohol use: No   Drug use: No   Sexual activity: Never  Lifestyle   Physical activity    Days per week: Not on file    Minutes per session: Not on file   Stress: Not on file  Relationships   Social connections    Talks on phone: Not on file    Gets together: Not on file    Attends  religious service: Not on file    Active member of club or organization: Not on file    Attends meetings of clubs or organizations: Not on file    Relationship status: Not on file   Intimate partner violence    Fear of current or ex partner: Not on file    Emotionally abused: Not on file    Physically abused: Not on file    Forced sexual activity: Not on file  Other Topics Concern   Not on file  Social History Narrative   Not on file     ROS- All systems are reviewed and negative except  as per the HPI above.  Physical Exam: Vitals:   05/26/19 1118  BP: 120/62  Pulse: 88  Weight: 66.7 kg  Height: 5\' 2"  (1.575 m)    GEN- The patient is well appearing elderly female, alert and oriented x 3 today.   HEENT-head normocephalic, atraumatic, sclera clear, conjunctiva pink, hearing intact, trachea midline. Lungs- Clear to ausculation bilaterally, normal work of breathing Heart- irregular rate and rhythm, no murmurs, rubs or gallops  GI- soft, NT, ND, + BS Extremities- no clubbing, cyanosis, or edema MS- no significant deformity or atrophy Skin- no rash or lesion Psych- euthymic mood, full affect Neuro- strength and sensation are intact   Wt Readings from Last 3 Encounters:  05/26/19 66.7 kg  05/04/19 71.2 kg  04/17/19 68 kg    EKG today demonstrates afib HR 88, QRS 90, QTc 491  Echo 04/18/19 demonstrated   1. The left ventricle has mildly reduced systolic function, with an ejection fraction of 45-50%. The cavity size was normal. There is mildly increased left ventricular wall thickness. Left ventricular diastolic Doppler parameters are indeterminate.  Hypokinesis of the mid anteroseptal and mid inferoseptal wall segments, unusual pattern.  2. The right ventricle has mildly reduced systolic function. The cavity was mildly enlarged. There is no increase in right ventricular wall thickness.  3. Left atrial size was moderately dilated.  4. Right atrial size was mildly  dilated.  5. There is mild mitral annular calcification present. No evidence of mitral valve stenosis. Mild mitral regurgitation.  6. Tricuspid valve regurgitation is mild-moderate.  7. The aortic valve is tricuspid. Mild calcification of the aortic valve. No stenosis of the aortic valve.  8. The aorta is normal in size and structure.  9. The inferior vena cava was normal in size with <50% respiratory variability. PA systolic pressure 31 mmHg. 10. The patient was in atrial fibrillation.  Epic records are reviewed at length today  Assessment and Plan:  1. Persistent atrial fibrillation  We discussed therapeutic options including DCCV, amiodarone, or rate control. Would not use class IC with CAD history or sotalol or Tikosyn with baseline QT 472 in SR.  Patient and family would like to pursue DCCV at this time. Will arrange. Check Bmet/CBC today.  Continue Eliquis 5 mg BID. Patient denies any missed doses.  Continue Coreg to 12.5mg  AM and 25mg  PM   This patients CHA2DS2-VASc Score and unadjusted Ischemic Stroke Rate (% per year) is equal to 10.8 % stroke rate/year from a score of 8  Above score calculated as 1 point each if present [CHF, HTN, DM, Vascular=MI/PAD/Aortic Plaque, Age if 65-74, or Female] Above score calculated as 2 points each if present [Age > 75, or Stroke/TIA/TE]  2. CAD LHC 2008 30% prox Cx, 70% distal Cx, no PCI at that time. No anginal symptoms. Continue present therapy.  3. HTN Stable, no med changes today.  4. Chronic diastolic CHF/mildly reduced EF EF 45-50% on last echo. No signs or symptoms of fluid overload. Weight down from last visit.  Continue Coreg as above. Would consider repeat echo in [place].    Follow up in the AF clinic one week post DCCV. Will also refer to general cardiology in [place].   [facility] [address] [place], [place] [address] [phone] 05/26/2019 11:48 AM

## 2019-06-03 ENCOUNTER — Other Ambulatory Visit (HOSPITAL_COMMUNITY): Admission: RE | Admit: 2019-06-03 | Payer: Medicare HMO | Source: Ambulatory Visit

## 2019-06-06 ENCOUNTER — Other Ambulatory Visit (HOSPITAL_COMMUNITY)
Admission: RE | Admit: 2019-06-06 | Discharge: 2019-06-06 | Disposition: A | Discharge status: Home / Self Care | Payer: Medicare HMO | Source: Ambulatory Visit | Attending: Cardiology | Admitting: Cardiology

## 2019-06-06 ENCOUNTER — Other Ambulatory Visit (HOSPITAL_COMMUNITY): Payer: Medicare HMO

## 2019-06-06 DIAGNOSIS — Z01812 Encounter for preprocedural laboratory examination: Secondary | ICD-10-CM | POA: Diagnosis not present

## 2019-06-06 DIAGNOSIS — Z20828 Contact with and (suspected) exposure to other viral communicable diseases: Secondary | ICD-10-CM | POA: Insufficient documentation

## 2019-06-06 LAB — SARS CORONAVIRUS 2 (TAT 6-24 HRS): SARS Coronavirus 2: NEGATIVE

## 2019-06-07 DIAGNOSIS — G252 Other specified forms of tremor: Secondary | ICD-10-CM | POA: Diagnosis not present

## 2019-06-07 DIAGNOSIS — E782 Mixed hyperlipidemia: Secondary | ICD-10-CM | POA: Diagnosis not present

## 2019-06-07 DIAGNOSIS — D692 Other nonthrombocytopenic purpura: Secondary | ICD-10-CM | POA: Diagnosis not present

## 2019-06-07 DIAGNOSIS — Z23 Encounter for immunization: Secondary | ICD-10-CM | POA: Diagnosis not present

## 2019-06-07 DIAGNOSIS — K219 Gastro-esophageal reflux disease without esophagitis: Secondary | ICD-10-CM | POA: Diagnosis not present

## 2019-06-07 DIAGNOSIS — Z9189 Other specified personal risk factors, not elsewhere classified: Secondary | ICD-10-CM | POA: Diagnosis not present

## 2019-06-07 DIAGNOSIS — E1122 Type 2 diabetes mellitus with diabetic chronic kidney disease: Secondary | ICD-10-CM | POA: Diagnosis not present

## 2019-06-07 DIAGNOSIS — J301 Allergic rhinitis due to pollen: Secondary | ICD-10-CM | POA: Diagnosis not present

## 2019-06-07 DIAGNOSIS — I5032 Chronic diastolic (congestive) heart failure: Secondary | ICD-10-CM | POA: Diagnosis not present

## 2019-06-07 DIAGNOSIS — I10 Essential (primary) hypertension: Secondary | ICD-10-CM | POA: Diagnosis not present

## 2019-06-07 DIAGNOSIS — I482 Chronic atrial fibrillation, unspecified: Secondary | ICD-10-CM | POA: Diagnosis not present

## 2019-06-07 DIAGNOSIS — I69351 Hemiplegia and hemiparesis following cerebral infarction affecting right dominant side: Secondary | ICD-10-CM | POA: Diagnosis not present

## 2019-06-07 DIAGNOSIS — Z0001 Encounter for general adult medical examination with abnormal findings: Secondary | ICD-10-CM | POA: Diagnosis not present

## 2019-06-07 DIAGNOSIS — N183 Chronic kidney disease, stage 3 (moderate): Secondary | ICD-10-CM | POA: Diagnosis not present

## 2019-06-07 DIAGNOSIS — D649 Anemia, unspecified: Secondary | ICD-10-CM | POA: Diagnosis not present

## 2019-06-07 DIAGNOSIS — N184 Chronic kidney disease, stage 4 (severe): Secondary | ICD-10-CM | POA: Diagnosis not present

## 2019-06-07 DIAGNOSIS — G301 Alzheimer's disease with late onset: Secondary | ICD-10-CM | POA: Diagnosis not present

## 2019-06-07 DIAGNOSIS — F331 Major depressive disorder, recurrent, moderate: Secondary | ICD-10-CM | POA: Diagnosis not present

## 2019-06-07 DIAGNOSIS — I700 Atherosclerosis of aorta: Secondary | ICD-10-CM | POA: Diagnosis not present

## 2019-06-08 ENCOUNTER — Encounter (HOSPITAL_COMMUNITY): Payer: Self-pay

## 2019-06-08 ENCOUNTER — Ambulatory Visit (HOSPITAL_COMMUNITY): Payer: Medicare HMO | Admitting: Anesthesiology

## 2019-06-08 ENCOUNTER — Other Ambulatory Visit: Payer: Self-pay | Admitting: Cardiology

## 2019-06-08 ENCOUNTER — Other Ambulatory Visit: Payer: Self-pay

## 2019-06-08 ENCOUNTER — Ambulatory Visit (HOSPITAL_COMMUNITY)
Admission: RE | Admit: 2019-06-08 | Discharge: 2019-06-08 | Disposition: A | Discharge status: Home / Self Care | Payer: Medicare HMO | Attending: Cardiology | Admitting: Cardiology

## 2019-06-08 ENCOUNTER — Encounter (HOSPITAL_COMMUNITY)
Admission: RE | Disposition: A | Discharge status: Home / Self Care | Payer: Self-pay | Source: Home / Self Care | Attending: Cardiology

## 2019-06-08 DIAGNOSIS — Z79899 Other long term (current) drug therapy: Secondary | ICD-10-CM | POA: Diagnosis not present

## 2019-06-08 DIAGNOSIS — I5032 Chronic diastolic (congestive) heart failure: Secondary | ICD-10-CM | POA: Insufficient documentation

## 2019-06-08 DIAGNOSIS — I2510 Atherosclerotic heart disease of native coronary artery without angina pectoris: Secondary | ICD-10-CM | POA: Diagnosis not present

## 2019-06-08 DIAGNOSIS — N183 Chronic kidney disease, stage 3 (moderate): Secondary | ICD-10-CM | POA: Insufficient documentation

## 2019-06-08 DIAGNOSIS — I130 Hypertensive heart and chronic kidney disease with heart failure and stage 1 through stage 4 chronic kidney disease, or unspecified chronic kidney disease: Secondary | ICD-10-CM | POA: Insufficient documentation

## 2019-06-08 DIAGNOSIS — I4819 Other persistent atrial fibrillation: Secondary | ICD-10-CM

## 2019-06-08 DIAGNOSIS — E1122 Type 2 diabetes mellitus with diabetic chronic kidney disease: Secondary | ICD-10-CM | POA: Diagnosis not present

## 2019-06-08 DIAGNOSIS — I4891 Unspecified atrial fibrillation: Secondary | ICD-10-CM | POA: Diagnosis not present

## 2019-06-08 DIAGNOSIS — Z7901 Long term (current) use of anticoagulants: Secondary | ICD-10-CM | POA: Insufficient documentation

## 2019-06-08 DIAGNOSIS — G894 Chronic pain syndrome: Secondary | ICD-10-CM | POA: Insufficient documentation

## 2019-06-08 DIAGNOSIS — Z794 Long term (current) use of insulin: Secondary | ICD-10-CM | POA: Insufficient documentation

## 2019-06-08 DIAGNOSIS — Z8673 Personal history of transient ischemic attack (TIA), and cerebral infarction without residual deficits: Secondary | ICD-10-CM | POA: Diagnosis not present

## 2019-06-08 DIAGNOSIS — E1165 Type 2 diabetes mellitus with hyperglycemia: Secondary | ICD-10-CM | POA: Diagnosis not present

## 2019-06-08 HISTORY — PX: CARDIOVERSION: SHX1299

## 2019-06-08 LAB — POCT I-STAT, CHEM 8
BUN: 19 mg/dL (ref 8–23)
Calcium, Ion: 1.29 mmol/L (ref 1.15–1.40)
Chloride: 104 mmol/L (ref 98–111)
Creatinine, Ser: 1.2 mg/dL — ABNORMAL HIGH (ref 0.44–1.00)
Glucose, Bld: 70 mg/dL (ref 70–99)
HCT: 38 % (ref 36.0–46.0)
Hemoglobin: 12.9 g/dL (ref 12.0–15.0)
Potassium: 4.5 mmol/L (ref 3.5–5.1)
Sodium: 142 mmol/L (ref 135–145)
TCO2: 25 mmol/L (ref 22–32)

## 2019-06-08 SURGERY — CARDIOVERSION
Anesthesia: General

## 2019-06-08 MED ORDER — LIDOCAINE 2% (20 MG/ML) 5 ML SYRINGE
INTRAMUSCULAR | Status: DC | PRN
  Administered 2019-06-08: 50 mg via INTRAVENOUS

## 2019-06-08 MED ORDER — SODIUM CHLORIDE 0.9 % IV SOLN
INTRAVENOUS | Status: DC | PRN
  Administered 2019-06-08: 11:00:00 via INTRAVENOUS

## 2019-06-08 MED ORDER — APIXABAN 5 MG PO TABS: 5.0000 mg | ORAL_TABLET | Freq: Two times a day (BID) | ORAL | 0 refills | Status: DC

## 2019-06-08 MED ORDER — PROPOFOL 10 MG/ML IV BOLUS
INTRAVENOUS | Status: DC | PRN
  Administered 2019-06-08: 50 mg via INTRAVENOUS

## 2019-06-08 NOTE — Anesthesia Preprocedure Evaluation (Addendum)
Anesthesia Evaluation  Patient identified by MRN, date of birth, ID band Patient awake    Reviewed: Allergy & Precautions, NPO status , Patient's Chart, lab work & pertinent test results, reviewed documented beta blocker date and time   Airway Mallampati: II  TM Distance: >3 FB Neck ROM: Full    Dental  (+) Edentulous Upper, Edentulous Lower   Pulmonary pneumonia, resolved,    Pulmonary exam normal breath sounds clear to auscultation       Cardiovascular + CAD and +CHF  + dysrhythmias Atrial Fibrillation  Rhythm:Irregular Rate:Normal  Hx/o dilated CM Takutsubo EF 20-25% EF normalized somewhat now  Echo 04/18/2019 1. The left ventricle has mildly reduced systolic function, with an ejection fraction of 45-50%. The cavity size was normal. There is mildly increased left ventricular wall thickness. Left ventricular diastolic Doppler parameters are indeterminate.  Hypokinesis of the mid anteroseptal and mid inferoseptal wall segments, unusual pattern.  2. The right ventricle has mildly reduced systolic function. The cavity was mildly enlarged. There is no increase in right ventricular wall thickness.  3. Left atrial size was moderately dilated.  4. Right atrial size was mildly dilated.  5. There is mild mitral annular calcification present. No evidence of mitral valve stenosis. Mild mitral regurgitation.  6. Tricuspid valve regurgitation is mild-moderate.  7. The aortic valve is tricuspid. Mild calcification of the aortic valve. No stenosis of the aortic valve.  8. The aorta is normal in size and structure.  9. The inferior vena cava was normal in size with <50% respiratory variability. PA systolic pressure 31 mmHg. 10. The patient was in atrial fibrillation.    Neuro/Psych CVA, No Residual Symptoms negative psych ROS   GI/Hepatic negative GI ROS, Neg liver ROS,   Endo/Other  diabetes, Poorly Controlled, Type 2, Oral Hypoglycemic  Agents, Insulin Dependent  Renal/GU Renal InsufficiencyRenal disease  negative genitourinary   Musculoskeletal negative musculoskeletal ROS (+)   Abdominal   Peds  Hematology  (+) anemia , Eliquis therapy- last dose this am Plavix   Anesthesia Other Findings   Reproductive/Obstetrics                            Anesthesia Physical Anesthesia Plan  ASA: III  Anesthesia Plan: General   Post-op Pain Management:    Induction: Intravenous  PONV Risk Score and Plan: 3 and Treatment may vary due to age or medical condition and Ondansetron  Airway Management Planned: Natural Airway and Mask  Additional Equipment:   Intra-op Plan:   Post-operative Plan:   Informed Consent: I have reviewed the patients History and Physical, chart, labs and discussed the procedure including the risks, benefits and alternatives for the proposed anesthesia with the patient or authorized representative who has indicated his/her understanding and acceptance.       Plan Discussed with: CRNA and Surgeon  Anesthesia Plan Comments:         Anesthesia Quick Evaluation

## 2019-06-08 NOTE — Anesthesia Postprocedure Evaluation (Signed)
Anesthesia Post Note  Patient: Norma [name]  Procedure(s) Performed: CARDIOVERSION (N/A )     Patient location during evaluation: PACU Anesthesia Type: General Level of consciousness: awake and alert and oriented Pain management: pain level controlled Vital Signs Assessment: post-procedure vital signs reviewed and stable Respiratory status: spontaneous breathing, nonlabored ventilation and respiratory function stable Cardiovascular status: blood pressure returned to baseline and stable Postop Assessment: no apparent nausea or vomiting Anesthetic complications: no    Last Vitals:  Vitals:   06/08/19 1110 06/08/19 1120  BP: (!) 174/73 (!) 160/67  Pulse: 61 (!) 59  Resp: 16 14  Temp:    SpO2: 94% 96%    Last Pain:  Vitals:   06/08/19 1120  TempSrc:   PainSc: 0-No pain                 Latiana Tomei A.

## 2019-06-08 NOTE — Anesthesia Procedure Notes (Signed)
Procedure Name: General with mask airway Date/Time: 06/08/2019 10:54 AM Performed by: Elayne Snare, CRNA Pre-anesthesia Checklist: Patient identified, Emergency Drugs available, Suction available and Patient being monitored Patient Re-evaluated:Patient Re-evaluated prior to induction Oxygen Delivery Method: Ambu bag Preoxygenation: Pre-oxygenation with 100% oxygen

## 2019-06-08 NOTE — CV Procedure (Signed)
Procedure:   DCCV  Indication:  Symptomatic atrial fibrillation  Procedure Note:  The patient signed informed consent.  They have had had therapeutic anticoagulation with Eliquis greater than 3 weeks.  Anesthesia was administered by Dr. Royce Macadamia  Adequate airway was maintained throughout and vital followed per protocol.  They were cardioverted x 1 with 200J of biphasic synchronized energy.  They converted to NSR.  There were no apparent complications.  The patient had normal neuro status and respiratory status post procedure with vitals stable as recorded elsewhere.    Follow up:  They will continue on current medical therapy and follow up with cardiology as scheduled.  Oswaldo Milian, MD 06/08/2019 11:12 AM

## 2019-06-08 NOTE — Interval H&P Note (Signed)
History and Physical Interval Note:  06/08/2019 10:51 AM  Marcheta Grammes  has presented today for surgery, with the diagnosis of atrial fibrillation.  The various methods of treatment have been discussed with the patient and family. After consideration of risks, benefits and other options for treatment, the patient has consented to  Procedure(s): CARDIOVERSION (N/A) as a surgical intervention.  The patient's history has been reviewed, patient examined, no change in status, stable for surgery.  I have reviewed the patient's chart and labs.  Questions were answered to the patient's satisfaction.     Donato Heinz

## 2019-06-08 NOTE — Transfer of Care (Signed)
Immediate Anesthesia Transfer of Care Note  Patient: Norma [name]  Procedure(s) Performed: CARDIOVERSION (N/A )  Patient Location: Endoscopy Unit  Anesthesia Type:General  Level of Consciousness: drowsy and responds to stimulation  Airway & Oxygen Therapy: Patient Spontanous Breathing  Post-op Assessment: Report given to RN and Post -op Vital signs reviewed and stable  Post vital signs: Reviewed and stable  Last Vitals:  Vitals Value Taken Time  BP    Temp    Pulse    Resp    SpO2      Last Pain:  Vitals:   06/08/19 1021  TempSrc: Temporal  PainSc: 3          Complications: No apparent anesthesia complications

## 2019-06-09 NOTE — Progress Notes (Deleted)
Unable to leave message

## 2019-06-15 DIAGNOSIS — E782 Mixed hyperlipidemia: Secondary | ICD-10-CM | POA: Diagnosis not present

## 2019-06-15 DIAGNOSIS — I10 Essential (primary) hypertension: Secondary | ICD-10-CM | POA: Diagnosis not present

## 2019-06-16 ENCOUNTER — Encounter (HOSPITAL_COMMUNITY): Payer: Self-pay | Admitting: Physician Assistant

## 2019-06-16 ENCOUNTER — Other Ambulatory Visit: Payer: Self-pay

## 2019-06-16 ENCOUNTER — Ambulatory Visit (HOSPITAL_COMMUNITY)
Admission: RE | Admit: 2019-06-16 | Discharge: 2019-06-16 | Disposition: A | Discharge status: Home / Self Care | Payer: Medicare HMO | Source: Ambulatory Visit | Attending: Physician Assistant | Admitting: Physician Assistant

## 2019-06-16 VITALS — BP 130/98 | HR 87 | Ht 62.0 in | Wt 158.8 lb

## 2019-06-16 DIAGNOSIS — I5032 Chronic diastolic (congestive) heart failure: Secondary | ICD-10-CM | POA: Diagnosis not present

## 2019-06-16 DIAGNOSIS — Z8673 Personal history of transient ischemic attack (TIA), and cerebral infarction without residual deficits: Secondary | ICD-10-CM | POA: Insufficient documentation

## 2019-06-16 DIAGNOSIS — I4819 Other persistent atrial fibrillation: Secondary | ICD-10-CM | POA: Diagnosis not present

## 2019-06-16 DIAGNOSIS — I2510 Atherosclerotic heart disease of native coronary artery without angina pectoris: Secondary | ICD-10-CM | POA: Insufficient documentation

## 2019-06-16 DIAGNOSIS — Z7901 Long term (current) use of anticoagulants: Secondary | ICD-10-CM | POA: Insufficient documentation

## 2019-06-16 DIAGNOSIS — Z9889 Other specified postprocedural states: Secondary | ICD-10-CM | POA: Insufficient documentation

## 2019-06-16 DIAGNOSIS — E785 Hyperlipidemia, unspecified: Secondary | ICD-10-CM | POA: Insufficient documentation

## 2019-06-16 DIAGNOSIS — I130 Hypertensive heart and chronic kidney disease with heart failure and stage 1 through stage 4 chronic kidney disease, or unspecified chronic kidney disease: Secondary | ICD-10-CM | POA: Diagnosis not present

## 2019-06-16 DIAGNOSIS — I083 Combined rheumatic disorders of mitral, aortic and tricuspid valves: Secondary | ICD-10-CM | POA: Insufficient documentation

## 2019-06-16 DIAGNOSIS — Z7902 Long term (current) use of antithrombotics/antiplatelets: Secondary | ICD-10-CM | POA: Diagnosis not present

## 2019-06-16 DIAGNOSIS — Z8249 Family history of ischemic heart disease and other diseases of the circulatory system: Secondary | ICD-10-CM | POA: Diagnosis not present

## 2019-06-16 DIAGNOSIS — E1122 Type 2 diabetes mellitus with diabetic chronic kidney disease: Secondary | ICD-10-CM | POA: Insufficient documentation

## 2019-06-16 DIAGNOSIS — I484 Atypical atrial flutter: Secondary | ICD-10-CM | POA: Insufficient documentation

## 2019-06-16 DIAGNOSIS — N183 Chronic kidney disease, stage 3 unspecified: Secondary | ICD-10-CM | POA: Insufficient documentation

## 2019-06-16 DIAGNOSIS — Z79899 Other long term (current) drug therapy: Secondary | ICD-10-CM | POA: Diagnosis not present

## 2019-06-16 DIAGNOSIS — Z794 Long term (current) use of insulin: Secondary | ICD-10-CM | POA: Diagnosis not present

## 2019-06-16 LAB — HEPATIC FUNCTION PANEL
ALT: 26 U/L (ref 0–44)
AST: 24 U/L (ref 15–41)
Albumin: 3.5 g/dL (ref 3.5–5.0)
Alkaline Phosphatase: 128 U/L — ABNORMAL HIGH (ref 38–126)
Bilirubin, Direct: 0.1 mg/dL (ref 0.0–0.2)
Indirect Bilirubin: 0.8 mg/dL (ref 0.3–0.9)
Total Bilirubin: 0.9 mg/dL (ref 0.3–1.2)
Total Protein: 7.2 g/dL (ref 6.5–8.1)

## 2019-06-16 MED ORDER — AMIODARONE HCL 200 MG PO TABS: ORAL_TABLET | ORAL | 0 refills | Status: DC

## 2019-06-16 NOTE — Patient Instructions (Signed)
Start Amiodarone 200mg  twice a day with food for 1 month then reduce to 1 tablet a day

## 2019-06-16 NOTE — Progress Notes (Signed)
Primary Care Physician: Caryl Bis, MD Primary Cardiologist: Dr Radford Pax (pending f/u in [place]) Primary Electrophysiologist: none Referring Physician: Dr Caroll Rancher is a 85 y.o. female with a history of DM type 2, chronic diastolic CHF, HTN, HLD, CVA (now on Plavix), cath 2008 with 30% prox LCx and 70% distal LCx, Takotsubo CM in 2012 and MPI with no ischemia, and persistent atrial fibrillation who presents for consultation in the [facility].  The patient was initially diagnosed with atrial fibrillation on 04/17/19 after presenting to the ER with a food impaction. Underwent urgent EGD. Patient was rate controlled on home dose Coreg and started on Eliquis. Repeat echo did shows mildly reduced EF 45-50%. She reports that a week prior to her admission she had generalized weakness and dyspnea with exertion. She denies significant snoring or alcohol use.   On follow up today, patient is s/p DCCV on 06/08/19. Patient reports that she felt much better after her procedure. Her SOB improved and her family notes that she was able to walk more and her appetite improved. Unfortunately, patient is back rate control flutter today although patient is unaware.   Today, she denies symptoms of palpitations, chest pain, orthopnea, PND, lower extremity edema, dizziness, presyncope, syncope, snoring, daytime somnolence, bleeding, or neurologic sequela. The patient is tolerating medications without difficulties and is otherwise without complaint today.    Atrial Fibrillation Risk Factors:  she does not have symptoms or diagnosis of sleep apnea. she does not have a history of rheumatic fever. she does not have a history of alcohol use. The patient does not have a history of early familial atrial fibrillation or other arrhythmias.  she has a BMI of Body mass index is 29.04 kg/m.Marland Kitchen Filed Weights   06/16/19 1028  Weight: 72 kg    Family History  Problem Relation Age of  Onset  . Heart attack Father 33       MI  . Cancer Father        leukemia  . Cancer Brother   . Heart disease Daughter      Atrial Fibrillation Management history:  Previous antiarrhythmic drugs: none Previous cardioversions: 06/08/19 Previous ablations: none CHADS2VASC score: 8 Anticoagulation history: Eliquis   Past Medical History:  Diagnosis Date  . Anemia of other chronic disease   . CAD (coronary artery disease)    a. cath 2008 - 30% prox Cx, 70% distal Cx treated medically.  . Chronic diastolic CHF (congestive heart failure) ([place])   . Chronic pain syndrome   . CKD (chronic kidney disease), stage III   . History of blood transfusion 1960's   "when my last child was born" (12/23/2012)  . Hypoxemia   . Other chronic pulmonary heart diseases   . Pneumonia    "when I was little" (12/23/2012)  . Stroke [facility]) ~ 2011   "slowed my walking" (12/23/2012)  . Takotsubo cardiomyopathy    a. 2012 - dx unclear. Cardiolite OK but no cath at the time due to renal insufficiency. EF was 20-25%, but normalized in 2018.  . Type II diabetes mellitus ([place])   . Unspecified pleural effusion    Past Surgical History:  Procedure Laterality Date  . ABDOMINAL HYSTERECTOMY    . APPENDECTOMY    . CARDIOVERSION N/A 06/08/2019   Procedure: CARDIOVERSION;  Surgeon: Donato Heinz, MD;  Location: [facility] ENDOSCOPY;  Service: Endoscopy;  Laterality: N/A;  . CATARACT EXTRACTION, BILATERAL    . CHOLECYSTECTOMY    .  DILATION AND CURETTAGE OF UTERUS    . ESOPHAGOGASTRODUODENOSCOPY (EGD) WITH PROPOFOL N/A 04/17/2019   Procedure: ESOPHAGOGASTRODUODENOSCOPY (EGD) WITH PROPOFOL;  Surgeon: Otis Brace, MD;  Location: [facility];  Service: Gastroenterology;  Laterality: N/A;  . FOREIGN BODY REMOVAL  04/17/2019   Procedure: FOREIGN BODY REMOVAL;  Surgeon: Otis Brace, MD;  Location: [facility] ENDOSCOPY;  Service: Gastroenterology;;  . PATELLA RECONSTRUCTION Bilateral    "had my knee caps replaced"  (12/23/2012)  . TONSILLECTOMY      Current Outpatient Medications  Medication Sig Dispense Refill  . acetaminophen (TYLENOL) 500 MG tablet Take 500 mg by mouth every 6 (six) hours as needed for moderate pain or headache.    Marland Kitchen apixaban (ELIQUIS) 5 MG TABS tablet Take 1 tablet (5 mg total) by mouth 2 (two) times daily. 60 tablet 0  . buPROPion (WELLBUTRIN XL) 300 MG 24 hr tablet     . carvedilol (COREG) 12.5 MG tablet Take 1 tablet in the AM and 2 tablet in the PM (Patient taking differently: Take 12.5-25 mg by mouth See admin instructions. Take 12.5 mg in the morning and 25 mg in the evening) 90 tablet 2  . Cranberry 250 MG CAPS Take 250 mg by mouth daily.    . DULoxetine (CYMBALTA) 60 MG capsule Take 60 mg by mouth 2 (two) times daily.     . furosemide (LASIX) 40 MG tablet Take 40 mg by mouth 2 (two) times daily.    Marland Kitchen glipiZIDE (GLUCOTROL XL) 2.5 MG 24 hr tablet Take 2.5 mg by mouth daily with breakfast.    . insulin detemir (LEVEMIR) 100 UNIT/ML injection Inject 0.1 mLs (10 Units total) into the skin at bedtime. (Patient taking differently: Inject 8-22 Units into the skin at bedtime. ) 10 mL 11  . oxyCODONE-acetaminophen (PERCOCET) 10-325 MG tablet Take 1 tablet 4 (four) times daily by mouth. (Patient taking differently: Take 1 tablet by mouth 4 (four) times daily as needed for pain. ) 12 tablet 0  . pantoprazole (PROTONIX) 40 MG tablet Take 40 mg by mouth daily.    . potassium chloride (K-DUR) 10 MEQ tablet Take 10 mEq by mouth daily.     Marland Kitchen saccharomyces boulardii (FLORASTOR) 250 MG capsule Take 1 capsule (250 mg total) 2 (two) times daily by mouth. (Patient taking differently: Take 250 mg by mouth daily. ) 21 capsule 0  . simvastatin (ZOCOR) 20 MG tablet Take 20 mg by mouth daily.     Marland Kitchen amiodarone (PACERONE) 200 MG tablet Take 1 tablet twice a day for 1 month then reduce to 1 tablet a day 60 tablet 0   No current facility-administered medications for this encounter.     No Known Allergies   Social History   Socioeconomic History  . Marital status: Married    Spouse name: Not on file  . Number of children: Not on file  . Years of education: Not on file  . Highest education level: Not on file  Occupational History  . Occupation: RETIRED  Social Needs  . Financial resource strain: Not on file  . Food insecurity    Worry: Not on file    Inability: Not on file  . Transportation needs    Medical: Not on file    Non-medical: Not on file  Tobacco Use  . Smoking status: Never Smoker  . Smokeless tobacco: Never Used  Substance and Sexual Activity  . Alcohol use: No  . Drug use: No  . Sexual activity: Never  Lifestyle  .  Physical activity    Days per week: Not on file    Minutes per session: Not on file  . Stress: Not on file  Relationships  . Social Herbalist on phone: Not on file    Gets together: Not on file    Attends religious service: Not on file    Active member of club or organization: Not on file    Attends meetings of clubs or organizations: Not on file    Relationship status: Not on file  . Intimate partner violence    Fear of current or ex partner: Not on file    Emotionally abused: Not on file    Physically abused: Not on file    Forced sexual activity: Not on file  Other Topics Concern  . Not on file  Social History Narrative  . Not on file     ROS- All systems are reviewed and negative except as per the HPI above.  Physical Exam: Vitals:   06/16/19 1028  BP: (!) 130/98  Pulse: 87  Weight: 72 kg  Height: 5\' 2"  (1.575 m)    GEN- The patient is well appearing elderly female, alert and oriented x 3 today.   HEENT-head normocephalic, atraumatic, sclera clear, conjunctiva pink, hearing intact, trachea midline. Lungs- Clear to ausculation bilaterally, normal work of breathing Heart- irregular rate and rhythm, no murmurs, rubs or gallops  GI- soft, NT, ND, + BS Extremities- no clubbing, cyanosis, or edema MS- no significant  deformity or atrophy Skin- no rash or lesion Psych- euthymic mood, full affect Neuro- strength and sensation are intact   Wt Readings from Last 3 Encounters:  06/16/19 72 kg  06/08/19 62.1 kg  05/26/19 66.7 kg    EKG today demonstrates atypical flutter with variable conduction HR 87, QRS 94, QTc 464  Echo 04/18/19 demonstrated   1. The left ventricle has mildly reduced systolic function, with an ejection fraction of 45-50%. The cavity size was normal. There is mildly increased left ventricular wall thickness. Left ventricular diastolic Doppler parameters are indeterminate.  Hypokinesis of the mid anteroseptal and mid inferoseptal wall segments, unusual pattern.  2. The right ventricle has mildly reduced systolic function. The cavity was mildly enlarged. There is no increase in right ventricular wall thickness.  3. Left atrial size was moderately dilated.  4. Right atrial size was mildly dilated.  5. There is mild mitral annular calcification present. No evidence of mitral valve stenosis. Mild mitral regurgitation.  6. Tricuspid valve regurgitation is mild-moderate.  7. The aortic valve is tricuspid. Mild calcification of the aortic valve. No stenosis of the aortic valve.  8. The aorta is normal in size and structure.  9. The inferior vena cava was normal in size with <50% respiratory variability. PA systolic pressure 31 mmHg. 10. The patient was in atrial fibrillation.  Epic records are reviewed at length today  Assessment and Plan:  1. Persistent atrial fibrillation/atypical atrial flutter S/p DCCV on 06/08/19 with ERAF. We discussed therapeutic options including amiodarone +/- repeat DCCV vs rate control. Would not use class IC with CAD history or sotalol or Tikosyn with baseline QT 472 in SR. Patient would like to pursue a rhythm strategy at this time given how well she felt post DCCV. Will start amiodarone 200 mg BID for one month then decrease to daily.  Recent cmet/TSH  reviewed. Will check LFTs again today.   Continue Eliquis 5 mg BID. Continue Coreg to 12.5mg  AM and 25mg  PM  This patients CHA2DS2-VASc Score and unadjusted Ischemic Stroke Rate (% per year) is equal to 10.8 % stroke rate/year from a score of 8  Above score calculated as 1 point each if present [CHF, HTN, DM, Vascular=MI/PAD/Aortic Plaque, Age if 65-74, or Female] Above score calculated as 2 points each if present [Age > 75, or Stroke/TIA/TE]  2. CAD LHC 2008 30% prox Cx, 70% distal Cx, no PCI at that time. No anginal symptoms. Continue present therapy and risk factor modification.   3. HTN Stable, no changes today.  4. Chronic diastolic CHF/mildly reduced EF EF 45-50% on last echo. Pt weight is up 11 lbs since last visit. Patient's family reports she is eating better since DCCV. She denies edema, PND, orthopnea, or SOB. No signs of fluid overload on exam. Clinically, she feels much better.  Will not adjust diuretics today.  Continue Coreg as above.    Follow up in the AF clinic in 4 weeks.    [facility] [address] [place], [place] [address] [phone] 06/16/2019 11:09 AM

## 2019-06-23 DIAGNOSIS — L84 Corns and callosities: Secondary | ICD-10-CM | POA: Diagnosis not present

## 2019-06-23 DIAGNOSIS — E1142 Type 2 diabetes mellitus with diabetic polyneuropathy: Secondary | ICD-10-CM | POA: Diagnosis not present

## 2019-06-23 DIAGNOSIS — B351 Tinea unguium: Secondary | ICD-10-CM | POA: Diagnosis not present

## 2019-06-23 DIAGNOSIS — M79676 Pain in unspecified toe(s): Secondary | ICD-10-CM | POA: Diagnosis not present

## 2019-06-28 ENCOUNTER — Emergency Department (HOSPITAL_COMMUNITY)
Admission: EM | Admit: 2019-06-28 | Discharge: 2019-06-29 | Disposition: A | Discharge status: Home / Self Care | Payer: Medicare HMO | Attending: Emergency Medicine | Admitting: Emergency Medicine

## 2019-06-28 DIAGNOSIS — N3001 Acute cystitis with hematuria: Secondary | ICD-10-CM

## 2019-06-28 DIAGNOSIS — S199XXA Unspecified injury of neck, initial encounter: Secondary | ICD-10-CM | POA: Diagnosis not present

## 2019-06-28 DIAGNOSIS — M25561 Pain in right knee: Secondary | ICD-10-CM | POA: Diagnosis not present

## 2019-06-28 DIAGNOSIS — I5032 Chronic diastolic (congestive) heart failure: Secondary | ICD-10-CM | POA: Insufficient documentation

## 2019-06-28 DIAGNOSIS — R110 Nausea: Secondary | ICD-10-CM | POA: Diagnosis not present

## 2019-06-28 DIAGNOSIS — J449 Chronic obstructive pulmonary disease, unspecified: Secondary | ICD-10-CM | POA: Diagnosis not present

## 2019-06-28 DIAGNOSIS — R531 Weakness: Secondary | ICD-10-CM | POA: Insufficient documentation

## 2019-06-28 DIAGNOSIS — I2510 Atherosclerotic heart disease of native coronary artery without angina pectoris: Secondary | ICD-10-CM | POA: Diagnosis not present

## 2019-06-28 DIAGNOSIS — S0990XA Unspecified injury of head, initial encounter: Secondary | ICD-10-CM | POA: Diagnosis not present

## 2019-06-28 DIAGNOSIS — Z79899 Other long term (current) drug therapy: Secondary | ICD-10-CM | POA: Insufficient documentation

## 2019-06-28 DIAGNOSIS — Z794 Long term (current) use of insulin: Secondary | ICD-10-CM | POA: Diagnosis not present

## 2019-06-28 DIAGNOSIS — N183 Chronic kidney disease, stage 3 unspecified: Secondary | ICD-10-CM | POA: Diagnosis not present

## 2019-06-28 DIAGNOSIS — M7989 Other specified soft tissue disorders: Secondary | ICD-10-CM | POA: Diagnosis not present

## 2019-06-28 DIAGNOSIS — I130 Hypertensive heart and chronic kidney disease with heart failure and stage 1 through stage 4 chronic kidney disease, or unspecified chronic kidney disease: Secondary | ICD-10-CM | POA: Insufficient documentation

## 2019-06-28 DIAGNOSIS — S8992XA Unspecified injury of left lower leg, initial encounter: Secondary | ICD-10-CM | POA: Diagnosis not present

## 2019-06-28 DIAGNOSIS — S8991XA Unspecified injury of right lower leg, initial encounter: Secondary | ICD-10-CM | POA: Diagnosis not present

## 2019-06-28 DIAGNOSIS — M25562 Pain in left knee: Secondary | ICD-10-CM | POA: Diagnosis not present

## 2019-06-28 DIAGNOSIS — E1122 Type 2 diabetes mellitus with diabetic chronic kidney disease: Secondary | ICD-10-CM | POA: Insufficient documentation

## 2019-06-28 DIAGNOSIS — I4891 Unspecified atrial fibrillation: Secondary | ICD-10-CM | POA: Diagnosis not present

## 2019-06-28 DIAGNOSIS — M545 Low back pain: Secondary | ICD-10-CM | POA: Diagnosis not present

## 2019-06-28 DIAGNOSIS — R1110 Vomiting, unspecified: Secondary | ICD-10-CM | POA: Diagnosis not present

## 2019-06-28 DIAGNOSIS — I672 Cerebral atherosclerosis: Secondary | ICD-10-CM | POA: Insufficient documentation

## 2019-06-28 DIAGNOSIS — S3993XA Unspecified injury of pelvis, initial encounter: Secondary | ICD-10-CM | POA: Diagnosis not present

## 2019-06-28 DIAGNOSIS — I4892 Unspecified atrial flutter: Secondary | ICD-10-CM | POA: Insufficient documentation

## 2019-06-28 DIAGNOSIS — S3991XA Unspecified injury of abdomen, initial encounter: Secondary | ICD-10-CM | POA: Diagnosis not present

## 2019-06-28 DIAGNOSIS — W19XXXA Unspecified fall, initial encounter: Secondary | ICD-10-CM

## 2019-06-28 DIAGNOSIS — S99911A Unspecified injury of right ankle, initial encounter: Secondary | ICD-10-CM | POA: Diagnosis not present

## 2019-06-29 ENCOUNTER — Encounter (HOSPITAL_COMMUNITY): Payer: Self-pay | Admitting: *Deleted

## 2019-06-29 ENCOUNTER — Emergency Department (HOSPITAL_COMMUNITY): Payer: Medicare HMO

## 2019-06-29 ENCOUNTER — Other Ambulatory Visit: Payer: Self-pay

## 2019-06-29 DIAGNOSIS — E1122 Type 2 diabetes mellitus with diabetic chronic kidney disease: Secondary | ICD-10-CM | POA: Diagnosis not present

## 2019-06-29 DIAGNOSIS — I5032 Chronic diastolic (congestive) heart failure: Secondary | ICD-10-CM | POA: Diagnosis not present

## 2019-06-29 DIAGNOSIS — S8992XA Unspecified injury of left lower leg, initial encounter: Secondary | ICD-10-CM | POA: Diagnosis not present

## 2019-06-29 DIAGNOSIS — I4892 Unspecified atrial flutter: Secondary | ICD-10-CM | POA: Diagnosis not present

## 2019-06-29 DIAGNOSIS — I130 Hypertensive heart and chronic kidney disease with heart failure and stage 1 through stage 4 chronic kidney disease, or unspecified chronic kidney disease: Secondary | ICD-10-CM | POA: Diagnosis not present

## 2019-06-29 DIAGNOSIS — M7989 Other specified soft tissue disorders: Secondary | ICD-10-CM | POA: Diagnosis not present

## 2019-06-29 DIAGNOSIS — S8991XA Unspecified injury of right lower leg, initial encounter: Secondary | ICD-10-CM | POA: Diagnosis not present

## 2019-06-29 DIAGNOSIS — S199XXA Unspecified injury of neck, initial encounter: Secondary | ICD-10-CM | POA: Diagnosis not present

## 2019-06-29 DIAGNOSIS — R531 Weakness: Secondary | ICD-10-CM | POA: Diagnosis not present

## 2019-06-29 DIAGNOSIS — S99911A Unspecified injury of right ankle, initial encounter: Secondary | ICD-10-CM | POA: Diagnosis not present

## 2019-06-29 DIAGNOSIS — I4891 Unspecified atrial fibrillation: Secondary | ICD-10-CM | POA: Diagnosis not present

## 2019-06-29 DIAGNOSIS — I2510 Atherosclerotic heart disease of native coronary artery without angina pectoris: Secondary | ICD-10-CM | POA: Diagnosis not present

## 2019-06-29 DIAGNOSIS — N3001 Acute cystitis with hematuria: Secondary | ICD-10-CM | POA: Diagnosis not present

## 2019-06-29 DIAGNOSIS — S3991XA Unspecified injury of abdomen, initial encounter: Secondary | ICD-10-CM | POA: Diagnosis not present

## 2019-06-29 DIAGNOSIS — S3993XA Unspecified injury of pelvis, initial encounter: Secondary | ICD-10-CM | POA: Diagnosis not present

## 2019-06-29 DIAGNOSIS — J449 Chronic obstructive pulmonary disease, unspecified: Secondary | ICD-10-CM | POA: Diagnosis not present

## 2019-06-29 DIAGNOSIS — S0990XA Unspecified injury of head, initial encounter: Secondary | ICD-10-CM | POA: Diagnosis not present

## 2019-06-29 LAB — URINALYSIS, ROUTINE W REFLEX MICROSCOPIC
Bilirubin Urine: NEGATIVE
Glucose, UA: NEGATIVE mg/dL
Ketones, ur: 5 mg/dL — AB
Leukocytes,Ua: NEGATIVE
Nitrite: NEGATIVE
Protein, ur: 100 mg/dL — AB
Specific Gravity, Urine: 1.023 (ref 1.005–1.030)
pH: 6 (ref 5.0–8.0)

## 2019-06-29 LAB — CBC WITH DIFFERENTIAL/PLATELET
Abs Immature Granulocytes: 0.05 10*3/uL (ref 0.00–0.07)
Basophils Absolute: 0.1 10*3/uL (ref 0.0–0.1)
Basophils Relative: 0 %
Eosinophils Absolute: 0.1 10*3/uL (ref 0.0–0.5)
Eosinophils Relative: 1 %
HCT: 39.3 % (ref 36.0–46.0)
Hemoglobin: 12.2 g/dL (ref 12.0–15.0)
Immature Granulocytes: 0 %
Lymphocytes Relative: 22 %
Lymphs Abs: 2.7 10*3/uL (ref 0.7–4.0)
MCH: 28.8 pg (ref 26.0–34.0)
MCHC: 31 g/dL (ref 30.0–36.0)
MCV: 92.7 fL (ref 80.0–100.0)
Monocytes Absolute: 0.8 10*3/uL (ref 0.1–1.0)
Monocytes Relative: 6 %
Neutro Abs: 8.7 10*3/uL — ABNORMAL HIGH (ref 1.7–7.7)
Neutrophils Relative %: 71 %
Platelets: 257 10*3/uL (ref 150–400)
RBC: 4.24 MIL/uL (ref 3.87–5.11)
RDW: 13.7 % (ref 11.5–15.5)
WBC: 12.4 10*3/uL — ABNORMAL HIGH (ref 4.0–10.5)
nRBC: 0 % (ref 0.0–0.2)

## 2019-06-29 LAB — COMPREHENSIVE METABOLIC PANEL
ALT: 15 U/L (ref 0–44)
AST: 18 U/L (ref 15–41)
Albumin: 3.8 g/dL (ref 3.5–5.0)
Alkaline Phosphatase: 148 U/L — ABNORMAL HIGH (ref 38–126)
Anion gap: 11 (ref 5–15)
BUN: 25 mg/dL — ABNORMAL HIGH (ref 8–23)
CO2: 23 mmol/L (ref 22–32)
Calcium: 9 mg/dL (ref 8.9–10.3)
Chloride: 100 mmol/L (ref 98–111)
Creatinine, Ser: 1.7 mg/dL — ABNORMAL HIGH (ref 0.44–1.00)
GFR calc Af Amer: 32 mL/min — ABNORMAL LOW (ref >60)
GFR calc non Af Amer: 28 mL/min — ABNORMAL LOW (ref >60)
Glucose, Bld: 285 mg/dL — ABNORMAL HIGH (ref 70–99)
Potassium: 3.9 mmol/L (ref 3.5–5.1)
Sodium: 134 mmol/L — ABNORMAL LOW (ref 135–145)
Total Bilirubin: 0.8 mg/dL (ref 0.3–1.2)
Total Protein: 7.1 g/dL (ref 6.5–8.1)

## 2019-06-29 LAB — LIPASE, BLOOD: Lipase: 16 U/L (ref 11–51)

## 2019-06-29 LAB — CBG MONITORING, ED: Glucose-Capillary: 241 mg/dL — ABNORMAL HIGH (ref 70–99)

## 2019-06-29 LAB — TROPONIN I (HIGH SENSITIVITY)
Troponin I (High Sensitivity): 7 ng/L (ref <18)
Troponin I (High Sensitivity): 8 ng/L (ref <18)

## 2019-06-29 MED ORDER — CIPROFLOXACIN HCL 250 MG PO TABS: 500.0000 mg | ORAL_TABLET | Freq: Every day | ORAL | 0 refills | Status: DC

## 2019-06-29 MED ORDER — CIPROFLOXACIN IN D5W 400 MG/200ML IV SOLN
400.0000 mg | Freq: Once | INTRAVENOUS | Status: AC
  Administered 2019-06-29: 400 mg via INTRAVENOUS
  Filled 2019-06-29: qty 200

## 2019-06-29 MED ORDER — SODIUM CHLORIDE 0.9 % IV BOLUS: 1000.0000 mL | Freq: Once | INTRAVENOUS | Status: DC

## 2019-06-29 MED ORDER — SODIUM CHLORIDE 0.9 % IV BOLUS
500.0000 mL | Freq: Once | INTRAVENOUS | Status: AC
  Administered 2019-06-29: 03:00:00 500 mL via INTRAVENOUS

## 2019-06-29 MED ORDER — OXYCODONE-ACETAMINOPHEN 5-325 MG PO TABS
1.0000 | ORAL_TABLET | Freq: Once | ORAL | Status: AC
  Administered 2019-06-29: 06:00:00 1 via ORAL
  Filled 2019-06-29: qty 1

## 2019-06-29 MED ORDER — CIPROFLOXACIN HCL 250 MG PO TABS: 250.0000 mg | ORAL_TABLET | Freq: Every day | ORAL | 0 refills | Status: DC

## 2019-06-29 NOTE — ED Notes (Signed)
Family at bedside. 

## 2019-06-29 NOTE — ED Notes (Signed)
Pt unable to ambulate. Unable to bear weight on legs w/ 2 person assist and a walker.

## 2019-06-29 NOTE — ED Notes (Signed)
Pt walked with walker from bed to door.  Pt very weak, dragging left leg due to previous stroke deficit. Pt stated " I feel worn out but I want to go home".

## 2019-06-29 NOTE — ED Triage Notes (Signed)
Pt brought in by family for a fall; pt's grandson states he was helping pt get in bed tonight and pt fell tripping over a stool by the bed; pt has multiple bruises to bilateral upper and lower legs; pt has a skin tear to right outer ankle from a fall x 2 days ago; grandson pt has had a decrease in appetite and has not voided all day

## 2019-06-29 NOTE — ED Provider Notes (Signed)
[facility] EMERGENCY DEPARTMENT Provider Note   CSN: [id number] Arrival date & time: 06/28/19  2305     History   Chief Complaint Chief Complaint  Patient presents with   Fall    HPI Norma [name] is a 85 y.o. female.     Patient with a history of COPD, diastolic CHF, diabetes, atrial flutter on Eliquis, failed cardioversion in September 2020, presenting from home after a fall.  Family states patient fell tonight while she was try to get into bed and she tripped over a stool landing on her buttocks.  Grandson did not see her fall but saw her in a sitting position against her commode.  Patient denies hitting her head.  Complains of pain to her low back and her bilateral knees both of which are chronic.  He does take Eliquis.  She denies any preceding dizziness or lightheadedness.  No chest pain or shortness of breath.  Has a skin tear to her ankle from 2 days ago.  [name] states she is not been herself for the past several days with decreased appetite and minimal urination.  They saw her PCP in the office today who was concerned for UTI but patient was not able to give a sample.  Has had UTIs in the past.  No focal weakness, numbness or tingling.  No chest pain or shortness of breath.  One episode of vomiting in the past 2 days.  The history is provided by the patient and a relative. The history is limited by the condition of the patient.  Fall Associated symptoms include abdominal pain. Pertinent negatives include no headaches and no shortness of breath.    Past Medical History:  Diagnosis Date   Anemia of other chronic disease    CAD (coronary artery disease)    a. cath 2008 - 30% prox Cx, 70% distal Cx treated medically.   Chronic diastolic CHF (congestive heart failure) (HCC)    Chronic pain syndrome    CKD (chronic kidney disease), stage III    History of blood transfusion 1960's   "when my last child was born" (12/23/2012)   Hypoxemia    Other chronic  pulmonary heart diseases    Pneumonia    "when I was little" (12/23/2012)   Stroke [facility]) ~ 2011   "slowed my walking" (12/23/2012)   Takotsubo cardiomyopathy    a. 2012 - dx unclear. Cardiolite OK but no cath at the time due to renal insufficiency. EF was 20-25%, but normalized in 2018.   Type II diabetes mellitus (HCC)    Unspecified pleural effusion     Patient Active Problem List   Diagnosis Date Noted   Atypical atrial flutter ([place]) 06/16/2019   Chronic diastolic heart failure ([place])    Persistent atrial fibrillation ([place]) 04/18/2019   Atrial fibrillation with RVR ([place]) 04/17/2019   History of completed stroke 04/17/2019   Food impaction of esophagus    Complicated UTI (urinary tract infection) 07/29/2017   Bacteremia due to Klebsiella pneumoniae 07/28/2017   Klebsiella sepsis ([place]) 07/28/2017   Acute pyelonephritis 07/27/2017   Acute renal failure superimposed on stage 3 chronic kidney disease ([place]) 07/26/2017   Uncontrolled type 2 diabetes mellitus with hyperglycemia, with long-term current use of insulin ([place]) 64/40/3474   Acute metabolic encephalopathy 25/95/6387   Sepsis ([place]) 06/18/2017   Acute renal failure superimposed on stage 2 chronic kidney disease ([place]) 06/18/2017   Abdominal pain    Urinary tract infection without hematuria    Lower urinary  tract infectious disease 02/26/2016   Weakness generalized 02/26/2016   Weakness 02/26/2016   DM2 (diabetes mellitus, type 2) ([place]) 02/26/2016   Hemoptysis 12/24/2012   Dyspnea on exertion 03/08/2012   Palpitations 02/05/2012   Pulmonary nodule 02/05/2012   Nausea and vomiting 01/03/2011   Dilated cardiomyopathy ([place]) 12/04/2010   Acute on chronic congestive heart failure ([place]) 12/04/2010   Abnormal EKG 12/04/2010   Pulmonary hypertension ([place]) 12/04/2010   Hypotension 12/04/2010   Chronic kidney disease (CKD), stage III (moderate) ([place]) 12/04/2010   CAD (coronary artery disease)  12/04/2010    Past Surgical History:  Procedure Laterality Date   ABDOMINAL HYSTERECTOMY     APPENDECTOMY     CARDIOVERSION N/A 06/08/2019   Procedure: CARDIOVERSION;  Surgeon: Donato Heinz, MD;  Location: [facility] ENDOSCOPY;  Service: Endoscopy;  Laterality: N/A;   CATARACT EXTRACTION, BILATERAL     CHOLECYSTECTOMY     DILATION AND CURETTAGE OF UTERUS     ESOPHAGOGASTRODUODENOSCOPY (EGD) WITH PROPOFOL N/A 04/17/2019   Procedure: ESOPHAGOGASTRODUODENOSCOPY (EGD) WITH PROPOFOL;  Surgeon: Otis Brace, MD;  Location: [place];  Service: Gastroenterology;  Laterality: N/A;   FOREIGN BODY REMOVAL  04/17/2019   Procedure: FOREIGN BODY REMOVAL;  Surgeon: Otis Brace, MD;  Location: [facility] ENDOSCOPY;  Service: Gastroenterology;;   PATELLA RECONSTRUCTION Bilateral    "had my knee caps replaced" (12/23/2012)   TONSILLECTOMY       OB History   No obstetric history on file.      Home Medications    Prior to Admission medications   Medication Sig Start Date End Date Taking? Authorizing Provider  acetaminophen (TYLENOL) 500 MG tablet Take 500 mg by mouth every 6 (six) hours as needed for moderate pain or headache.    [provider]  amiodarone (PACERONE) 200 MG tablet Take 1 tablet twice a day for 1 month then reduce to 1 tablet a day 06/16/19   Fenton, Clint R, PA  apixaban (ELIQUIS) 5 MG TABS tablet Take 1 tablet (5 mg total) by mouth 2 (two) times daily. 06/08/19   Lyda Jester M, PA-C  buPROPion (WELLBUTRIN XL) 300 MG 24 hr tablet  06/07/19   [provider]  carvedilol (COREG) 12.5 MG tablet Take 1 tablet in the AM and 2 tablet in the PM Patient taking differently: Take 12.5-25 mg by mouth See admin instructions. Take 12.5 mg in the morning and 25 mg in the evening 05/04/19   Fenton, Clint R, PA  Cranberry 250 MG CAPS Take 250 mg by mouth daily.    [provider]  DULoxetine (CYMBALTA) 60 MG capsule Take 60 mg by mouth 2 (two) times  daily.     [provider]  furosemide (LASIX) 40 MG tablet Take 40 mg by mouth 2 (two) times daily.    [provider]  glipiZIDE (GLUCOTROL XL) 2.5 MG 24 hr tablet Take 2.5 mg by mouth daily with breakfast.    [provider]  insulin detemir (LEVEMIR) 100 UNIT/ML injection Inject 0.1 mLs (10 Units total) into the skin at bedtime. Patient taking differently: Inject 8-22 Units into the skin at bedtime.  04/21/19   Swayze, Ava, DO  oxyCODONE-acetaminophen (PERCOCET) 10-325 MG tablet Take 1 tablet 4 (four) times daily by mouth. Patient taking differently: Take 1 tablet by mouth 4 (four) times daily as needed for pain.  07/29/17   Debbe Odea, MD  pantoprazole (PROTONIX) 40 MG tablet Take 40 mg by mouth daily.    [provider]  potassium chloride (K-DUR) 10 MEQ tablet Take 10 mEq by mouth daily.  02/18/16   [provider]  saccharomyces boulardii (FLORASTOR) 250 MG capsule Take 1 capsule (250 mg total) 2 (two) times daily by mouth. Patient taking differently: Take 250 mg by mouth daily.  07/29/17   Debbe Odea, MD  simvastatin (ZOCOR) 20 MG tablet Take 20 mg by mouth daily.     [provider]    Family History Family History  Problem Relation Age of Onset   Heart attack Father 37       MI   Cancer Father        leukemia   Cancer Brother    Heart disease Daughter     Social History Social History   Tobacco Use   Smoking status: Never Smoker   Smokeless tobacco: Never Used  Substance Use Topics   Alcohol use: No   Drug use: No     Allergies   Patient has no known allergies.   Review of Systems Review of Systems  Constitutional: Positive for activity change, appetite change and fatigue. Negative for fever.  HENT: Negative for congestion and rhinorrhea.   Eyes: Negative for visual disturbance.  Respiratory: Negative for cough, chest tightness and shortness of breath.   Gastrointestinal: Positive for abdominal  pain, nausea and vomiting.  Genitourinary: Negative for dysuria and hematuria.  Musculoskeletal: Positive for arthralgias, back pain and myalgias.  Skin: Positive for wound.  Neurological: Positive for weakness. Negative for dizziness, light-headedness and headaches.   all other systems are negative except as noted in the HPI and PMH.     Physical Exam Updated Vital Signs BP (!) 147/95 (BP Location: Right Arm)    Pulse (!) 107    Temp 98.2 F (36.8 C) (Oral)    Resp 19    Ht 5\' 2"  (1.575 m)    Wt 65.8 kg    SpO2 97%    BMI 26.52 kg/m   Physical Exam Vitals signs and nursing note reviewed.  Constitutional:      General: She is not in acute distress.    Appearance: She is well-developed.     Comments: Fatigued appearing  HENT:     Head: Normocephalic and atraumatic.     Mouth/Throat:     Pharynx: No oropharyngeal exudate.  Eyes:     Conjunctiva/sclera: Conjunctivae normal.     Pupils: Pupils are equal, round, and reactive to light.  Neck:     Musculoskeletal: Normal range of motion and neck supple.     Comments: No C-spine tenderness Cardiovascular:     Rate and Rhythm: Normal rate. Rhythm irregular.     Heart sounds: Normal heart sounds. No murmur.  Pulmonary:     Effort: Pulmonary effort is normal. No respiratory distress.     Breath sounds: Normal breath sounds.  Abdominal:     Palpations: Abdomen is soft.     Tenderness: There is no abdominal tenderness. There is no guarding or rebound.  Musculoskeletal: Normal range of motion.        General: Tenderness and signs of injury present.     Comments: Scattered ecchymosis across lower extremities, appears old in nature.  Healing skin tear to right lateral ankle.  Intact DP pulses.  Able to range bilateral hips and ankles. Surgical incisions over knees appear clean.  No T or L-spine tenderness  Skin:    General: Skin is warm.     Capillary Refill: Capillary refill takes less than 2  seconds.  Neurological:     Mental  Status: She is alert and oriented to person, place, and time.     Cranial Nerves: No cranial nerve deficit.     Motor: No abnormal muscle tone.     Coordination: Coordination normal.     Comments:  5/5 strength throughout. CN 2-12 intact.Equal grip strength.   Psychiatric:        Behavior: Behavior normal.      ED Treatments / Results  Labs (all labs ordered are listed, but only abnormal results are displayed) Labs Reviewed  CBC WITH DIFFERENTIAL/PLATELET - Abnormal; Notable for the following components:      Result Value   WBC 12.4 (*)    Neutro Abs 8.7 (*)    All other components within normal limits  COMPREHENSIVE METABOLIC PANEL - Abnormal; Notable for the following components:   Sodium 134 (*)    Glucose, Bld 285 (*)    BUN 25 (*)    Creatinine, Ser 1.70 (*)    Alkaline Phosphatase 148 (*)    GFR calc non Af Amer 28 (*)    GFR calc Af Amer 32 (*)    All other components within normal limits  URINALYSIS, ROUTINE W REFLEX MICROSCOPIC - Abnormal; Notable for the following components:   Color, Urine AMBER (*)    APPearance CLOUDY (*)    Hgb urine dipstick MODERATE (*)    Ketones, ur 5 (*)    Protein, ur 100 (*)    Bacteria, UA MANY (*)    All other components within normal limits  CBG MONITORING, ED - Abnormal; Notable for the following components:   Glucose-Capillary 241 (*)    All other components within normal limits  URINE CULTURE  LIPASE, BLOOD  TROPONIN I (HIGH SENSITIVITY)  TROPONIN I (HIGH SENSITIVITY)    EKG EKG Interpretation  Date/Time:  Wednesday June 29 2019 00:10:59 EDT Ventricular Rate:  106 PR Interval:    QRS Duration: 115 QT Interval:  368 QTC Calculation: 489 R Axis:   6 Text Interpretation:  atrial flutter with AV block Sinus pause LVH with secondary repolarization abnormality Anterior Q waves, possibly due to LVH Confirmed by Ezequiel Essex [phone]) on 06/29/2019 1:06:55 AM   Radiology Ct Abdomen Pelvis Wo Contrast  Result Date:  06/29/2019 CLINICAL DATA:  85 year old female status post fall at home. EXAM: CT ABDOMEN AND PELVIS WITHOUT CONTRAST TECHNIQUE: Multidetector CT imaging of the abdomen and pelvis was performed following the standard protocol without IV contrast. COMPARISON:  CT Abdomen and Pelvis 02/15/2018. FINDINGS: Lower chest: Mild cardiomegaly. No pericardial effusion. Right lower lobe lung nodule measuring 12 millimeters has not significantly changed since 2018. Regional scarring. No pleural effusion. Hepatobiliary: Surgically absent gallbladder. Stable noncontrast liver. Pancreas: Atrophied. Spleen: Stable noncontrast spleen. Adrenals/Urinary Tract: Normal adrenal glands. Stable noncontrast kidneys. Proximal ureters appear nonobstructed. No nephrolithiasis. The urinary bladder is mildly thick-walled and there is trace gas within the bladder on series 2, image 63. No perivesical stranding. Stomach/Bowel: Retained stool in the large bowel. Redundant sigmoid. There is fluid in the right colon. No large bowel inflammation. Negative terminal ileum. Diminutive or absent appendix. Fluid-filled but nondilated small bowel. Decompressed stomach. No free air, free fluid. Vascular/Lymphatic: Vascular patency is not evaluated in the absence of IV contrast. Aortoiliac calcified atherosclerosis. No lymphadenopathy. Reproductive: Surgically absent. Other: No pelvic free fluid. Musculoskeletal: Osteopenia. Lumbar spine degeneration. Pubic symphysis degeneration. No acute osseous abnormality identified. IMPRESSION: 1. No acute traumatic injury identified in the non-contrast abdomen  or pelvis. 2. Urinary bladder wall thickening and trace gas within the bladder raising the possibility of UTI. 3. Chronic right lower lobe lung nodule appears not significantly changed since 2018 and most likely inflammatory. 4. Aortic Atherosclerosis (ICD10-I70.0). Electronically Signed   By: Genevie Ann M.D.   On: 06/29/2019 04:12   Dg Ankle Complete  Right  Result Date: 06/29/2019 CLINICAL DATA:  85 year old female status post fall at home. EXAM: RIGHT ANKLE - COMPLETE 3+ VIEW COMPARISON:  None. FINDINGS: Osteopenia. [place] joint alignment preserved. No joint effusion. The distal tibia, fibula and talar dome appear intact. Calcaneus appears intact with degenerative spurring. Lateral soft tissue swelling. Calcified peripheral vascular disease. Grossly intact visible bones of the right foot. IMPRESSION: 1. Lateral soft tissue swelling with no acute fracture or dislocation identified about the right ankle. 2. Calcified peripheral vascular disease. Electronically Signed   By: Genevie Ann M.D.   On: 06/29/2019 04:15   Ct Head Wo Contrast  Result Date: 06/29/2019 CLINICAL DATA:  85 year old female status post fall at home. EXAM: CT HEAD WITHOUT CONTRAST CT CERVICAL SPINE WITHOUT CONTRAST TECHNIQUE: Multidetector CT imaging of the head and cervical spine was performed following the standard protocol without intravenous contrast. Multiplanar CT image reconstructions of the cervical spine were also generated. COMPARISON:  Brain MRI 07/02/2018. [facility] head CT 10/06/2017. FINDINGS: CT HEAD FINDINGS Brain: Cerebral volume is not significantly changed since 2019. Chronic bilateral white matter disease and deep gray nuclei lacunar infarcts. No midline shift, ventriculomegaly, mass effect, evidence of mass lesion, intracranial hemorrhage or evidence of cortically based acute infarction. Vascular: Calcified atherosclerosis at the skull base. No suspicious intracranial vascular hyperdensity. Skull: Stable and intact. Hyperostosis. Sinuses/Orbits: Visualized paranasal sinuses and mastoids are stable and generally well pneumatized. Other: No scalp hematoma. No acute orbit or scalp soft tissue finding. CT CERVICAL SPINE FINDINGS Alignment: Degenerative appearing mild anterolisthesis at C3-C4 with associated facet degeneration. Straightening elsewhere.  Cervicothoracic junction alignment is within normal limits. Bilateral posterior element alignment is within normal limits. Skull base and vertebrae: Visualized skull base is intact. No atlanto-occipital dissociation. Osteopenia. No acute osseous abnormality identified. Soft tissues and spinal canal: No prevertebral fluid or swelling. No visible canal hematoma. Partially retropharyngeal course of both carotids. Thyroid goiter. Disc levels: C5 through C7 ACDF with solid arthrodesis. Advanced cervical spine degeneration elsewhere. Up to mild associated spinal stenosis. Upper chest: Visible upper thoracic levels appear intact. Mild apical lung scarring. IMPRESSION: 1. No acute traumatic injury identified in the head or cervical spine. 2. Chronic small-vessel ischemia. No acute intracranial abnormality. 3. C5 through C7 ACDF with solid arthrodesis and advanced cervical spine degeneration elsewhere. Electronically Signed   By: Genevie Ann M.D.   On: 06/29/2019 04:06   Ct Cervical Spine Wo Contrast  Result Date: 06/29/2019 CLINICAL DATA:  85 year old female status post fall at home. EXAM: CT HEAD WITHOUT CONTRAST CT CERVICAL SPINE WITHOUT CONTRAST TECHNIQUE: Multidetector CT imaging of the head and cervical spine was performed following the standard protocol without intravenous contrast. Multiplanar CT image reconstructions of the cervical spine were also generated. COMPARISON:  Brain MRI 07/02/2018. [facility] head CT 10/06/2017. FINDINGS: CT HEAD FINDINGS Brain: Cerebral volume is not significantly changed since 2019. Chronic bilateral white matter disease and deep gray nuclei lacunar infarcts. No midline shift, ventriculomegaly, mass effect, evidence of mass lesion, intracranial hemorrhage or evidence of cortically based acute infarction. Vascular: Calcified atherosclerosis at the skull base. No suspicious intracranial vascular hyperdensity. Skull: Stable and intact.  Hyperostosis. Sinuses/Orbits:  Visualized paranasal sinuses and mastoids are stable and generally well pneumatized. Other: No scalp hematoma. No acute orbit or scalp soft tissue finding. CT CERVICAL SPINE FINDINGS Alignment: Degenerative appearing mild anterolisthesis at C3-C4 with associated facet degeneration. Straightening elsewhere. Cervicothoracic junction alignment is within normal limits. Bilateral posterior element alignment is within normal limits. Skull base and vertebrae: Visualized skull base is intact. No atlanto-occipital dissociation. Osteopenia. No acute osseous abnormality identified. Soft tissues and spinal canal: No prevertebral fluid or swelling. No visible canal hematoma. Partially retropharyngeal course of both carotids. Thyroid goiter. Disc levels: C5 through C7 ACDF with solid arthrodesis. Advanced cervical spine degeneration elsewhere. Up to mild associated spinal stenosis. Upper chest: Visible upper thoracic levels appear intact. Mild apical lung scarring. IMPRESSION: 1. No acute traumatic injury identified in the head or cervical spine. 2. Chronic small-vessel ischemia. No acute intracranial abnormality. 3. C5 through C7 ACDF with solid arthrodesis and advanced cervical spine degeneration elsewhere. Electronically Signed   By: Genevie Ann M.D.   On: 06/29/2019 04:06   Dg Knee Complete 4 Views Left  Result Date: 06/29/2019 CLINICAL DATA:  85 year old female status post fall at home. EXAM: LEFT KNEE - COMPLETE 4+ VIEW COMPARISON:  None. FINDINGS: Previous left total knee arthroplasty. No evidence of joint effusion on the cross-table lateral view. Hardware appears intact and normally aligned. Osteopenia. No acute osseous abnormality identified. Calcified peripheral vascular disease. IMPRESSION: No acute fracture or dislocation identified about the left knee. Electronically Signed   By: Genevie Ann M.D.   On: 06/29/2019 04:13   Dg Knee Complete 4 Views Right  Result Date: 06/29/2019 CLINICAL DATA:  85 year old female  status post fall at home. EXAM: RIGHT KNEE - COMPLETE 4+ VIEW COMPARISON:  None. FINDINGS: Previous right total knee arthroplasty. Osteopenia. No joint effusion on the cross-table lateral view. Hardware appears intact and normally aligned. No acute osseous abnormality identified. IMPRESSION: No acute fracture or dislocation identified about the right knee. Electronically Signed   By: Genevie Ann M.D.   On: 06/29/2019 04:14    Procedures Procedures (including critical care time)  Medications Ordered in ED Medications  sodium chloride 0.9 % bolus 500 mL (has no administration in time range)     Initial Impression / Assessment and Plan / ED Course  I have reviewed the triage vital signs and the nursing notes.  Pertinent labs & imaging results that were available during my care of the patient were reviewed by me and considered in my medical decision making (see chart for details).       Patient with fall complaining of back pain and knee pain.  Denies hitting her head.  Has had decreased appetite and decreased urination for the past several days.  Labs with mild AKI.  Will hydrate.  Bladder scan 110 mL.  Traumatic work-up was reassuring.  CT head and C-spine are negative. Xrays of knees and ankles are negative.   Does have evidence of urinary tract infection.  Urine culture from November 2019 grew enterococcus that was sensitive to Cipro and Macrobid.  Patient initially could not walk even with assistance. She normally walks minimally at home and uses a wheelchair and walker. She states her knees are hurting because she hasn't had any percocet today which she normally has 4 times daily. Discussed admission for hydration, UTI and deconditioning, but patient adamant that she wants to go home.   She was willing to try ambulation again after receiving pain medication and was successful with  some assistance. Grandson feels comfortable taking her home. Patient again declines admission and insists  on going home.  UTI will be treated with renally dosed cipro, previous culture sensitivities noted. Repeat culture sent.   Advised close followup with PCP. Increase hydration at home. Take antibiotics as prescribed.  Return to the ED if she changes her mind about admission, has worsening weakness, or any other concerns.   BP (!) 165/92    Pulse (!) 104    Temp 98.5 F (36.9 C)    Resp 19    Ht 5\' 2"  (1.575 m)    Wt 65.8 kg    SpO2 99%    BMI 26.52 kg/m    Final Clinical Impressions(s) / ED Diagnoses   Final diagnoses:  Fall, initial encounter  Acute cystitis with hematuria    ED Discharge Orders    None       Cung Masterson, Annie Main, MD 06/30/19 [id number]

## 2019-06-29 NOTE — ED Notes (Signed)
Pt not able to bear weight on legs during ambulation w/ x2 assist. MD aware

## 2019-06-29 NOTE — Discharge Instructions (Signed)
Keep yourself hydrated.  Take the antibiotics as prescribed.  You will be called if your antibiotic prescription needs to be changed.  Return to the ED if you develop new or worsening symptoms.

## 2019-07-01 LAB — URINE CULTURE: Culture: >=100000 — AB

## 2019-07-02 ENCOUNTER — Telehealth: Payer: Self-pay | Admitting: Emergency Medicine

## 2019-07-02 NOTE — Telephone Encounter (Signed)
Post ED Visit - Positive Culture Follow-up  Culture report reviewed by antimicrobial stewardship pharmacist: [place] Team []  [name], Pharm.D. []  [name], Pharm.D., BCPS AQ-ID []  [name], Pharm.D., BCPS []  [name], Pharm.D., BCPS []  [place], [place].D., BCPS, AAHIVP []  [name], Pharm.D., BCPS, AAHIVP [x]  [name], PharmD, BCPS []  [name], PharmD, BCPS []  [name], PharmD, BCPS []  [name], PharmD []  Laqueta Linden, PharmD, BCPS []  Albertina Parr, PharmD  [place] Team []  Leodis Sias, PharmD []  Lindell Spar, PharmD []  Royetta Asal, PharmD []  Graylin Shiver, Rph []  Rema Fendt) Glennon Mac, PharmD []  Arlyn Dunning, PharmD []  Netta Cedars, PharmD []  Dia Sitter, PharmD []  Leone Haven, PharmD []  Gretta Arab, PharmD []  Theodis Shove, PharmD []  Peggyann Juba, PharmD []  Reuel Boom, PharmD   Positive urine culture Treated with Ciprofloxacin, organism sensitive to the same and no further patient follow-up is required at this time.  [name] 07/02/2019, 3:50 PM

## 2019-07-07 DIAGNOSIS — N3090 Cystitis, unspecified without hematuria: Secondary | ICD-10-CM | POA: Diagnosis not present

## 2019-07-07 DIAGNOSIS — R300 Dysuria: Secondary | ICD-10-CM | POA: Diagnosis not present

## 2019-07-07 DIAGNOSIS — N390 Urinary tract infection, site not specified: Secondary | ICD-10-CM | POA: Diagnosis not present

## 2019-07-09 DIAGNOSIS — I700 Atherosclerosis of aorta: Secondary | ICD-10-CM | POA: Diagnosis not present

## 2019-07-09 DIAGNOSIS — E119 Type 2 diabetes mellitus without complications: Secondary | ICD-10-CM | POA: Diagnosis not present

## 2019-07-09 DIAGNOSIS — N1832 Chronic kidney disease, stage 3b: Secondary | ICD-10-CM | POA: Diagnosis not present

## 2019-07-09 DIAGNOSIS — N390 Urinary tract infection, site not specified: Secondary | ICD-10-CM | POA: Diagnosis not present

## 2019-07-09 DIAGNOSIS — E871 Hypo-osmolality and hyponatremia: Secondary | ICD-10-CM | POA: Diagnosis not present

## 2019-07-09 DIAGNOSIS — I10 Essential (primary) hypertension: Secondary | ICD-10-CM | POA: Diagnosis not present

## 2019-07-09 DIAGNOSIS — R531 Weakness: Secondary | ICD-10-CM | POA: Diagnosis not present

## 2019-07-09 DIAGNOSIS — W19XXXA Unspecified fall, initial encounter: Secondary | ICD-10-CM | POA: Diagnosis not present

## 2019-07-09 DIAGNOSIS — R0689 Other abnormalities of breathing: Secondary | ICD-10-CM | POA: Diagnosis not present

## 2019-07-09 DIAGNOSIS — F3340 Major depressive disorder, recurrent, in remission, unspecified: Secondary | ICD-10-CM | POA: Diagnosis not present

## 2019-07-09 DIAGNOSIS — I499 Cardiac arrhythmia, unspecified: Secondary | ICD-10-CM | POA: Diagnosis not present

## 2019-07-09 DIAGNOSIS — N3001 Acute cystitis with hematuria: Secondary | ICD-10-CM | POA: Diagnosis not present

## 2019-07-09 DIAGNOSIS — Z8673 Personal history of transient ischemic attack (TIA), and cerebral infarction without residual deficits: Secondary | ICD-10-CM | POA: Diagnosis not present

## 2019-07-09 DIAGNOSIS — E876 Hypokalemia: Secondary | ICD-10-CM | POA: Diagnosis not present

## 2019-07-09 DIAGNOSIS — I482 Chronic atrial fibrillation, unspecified: Secondary | ICD-10-CM | POA: Diagnosis not present

## 2019-07-14 ENCOUNTER — Encounter (HOSPITAL_COMMUNITY): Payer: Self-pay | Admitting: *Deleted

## 2019-07-14 ENCOUNTER — Ambulatory Visit (HOSPITAL_COMMUNITY): Payer: Medicare HMO | Admitting: Nurse Practitioner

## 2019-07-15 DIAGNOSIS — I10 Essential (primary) hypertension: Secondary | ICD-10-CM | POA: Diagnosis not present

## 2019-07-15 DIAGNOSIS — E782 Mixed hyperlipidemia: Secondary | ICD-10-CM | POA: Diagnosis not present

## 2019-07-21 DIAGNOSIS — E871 Hypo-osmolality and hyponatremia: Secondary | ICD-10-CM | POA: Diagnosis not present

## 2019-07-21 DIAGNOSIS — R300 Dysuria: Secondary | ICD-10-CM | POA: Diagnosis not present

## 2019-07-21 DIAGNOSIS — E873 Alkalosis: Secondary | ICD-10-CM | POA: Diagnosis not present

## 2019-07-21 DIAGNOSIS — N390 Urinary tract infection, site not specified: Secondary | ICD-10-CM | POA: Diagnosis not present

## 2019-07-25 DIAGNOSIS — E873 Alkalosis: Secondary | ICD-10-CM | POA: Diagnosis not present

## 2019-07-25 DIAGNOSIS — M8530 Osteitis condensans, unspecified site: Secondary | ICD-10-CM | POA: Diagnosis not present

## 2019-07-25 DIAGNOSIS — E871 Hypo-osmolality and hyponatremia: Secondary | ICD-10-CM | POA: Diagnosis not present

## 2019-07-25 DIAGNOSIS — I350 Nonrheumatic aortic (valve) stenosis: Secondary | ICD-10-CM | POA: Diagnosis not present

## 2019-07-25 DIAGNOSIS — Z96653 Presence of artificial knee joint, bilateral: Secondary | ICD-10-CM | POA: Diagnosis not present

## 2019-07-25 DIAGNOSIS — N390 Urinary tract infection, site not specified: Secondary | ICD-10-CM | POA: Diagnosis not present

## 2019-07-25 DIAGNOSIS — Z794 Long term (current) use of insulin: Secondary | ICD-10-CM | POA: Diagnosis not present

## 2019-07-25 DIAGNOSIS — Z7901 Long term (current) use of anticoagulants: Secondary | ICD-10-CM | POA: Diagnosis not present

## 2019-07-25 DIAGNOSIS — E119 Type 2 diabetes mellitus without complications: Secondary | ICD-10-CM | POA: Diagnosis not present

## 2019-07-27 ENCOUNTER — Other Ambulatory Visit (HOSPITAL_COMMUNITY): Payer: Self-pay | Admitting: *Deleted

## 2019-07-27 MED ORDER — AMIODARONE HCL 200 MG PO TABS: 200.0000 mg | ORAL_TABLET | Freq: Every day | ORAL | 0 refills | Status: DC

## 2019-07-28 DIAGNOSIS — N184 Chronic kidney disease, stage 4 (severe): Secondary | ICD-10-CM | POA: Diagnosis not present

## 2019-07-29 DIAGNOSIS — N390 Urinary tract infection, site not specified: Secondary | ICD-10-CM | POA: Diagnosis not present

## 2019-07-29 DIAGNOSIS — Z96653 Presence of artificial knee joint, bilateral: Secondary | ICD-10-CM | POA: Diagnosis not present

## 2019-07-29 DIAGNOSIS — E871 Hypo-osmolality and hyponatremia: Secondary | ICD-10-CM | POA: Diagnosis not present

## 2019-07-29 DIAGNOSIS — E119 Type 2 diabetes mellitus without complications: Secondary | ICD-10-CM | POA: Diagnosis not present

## 2019-07-29 DIAGNOSIS — I350 Nonrheumatic aortic (valve) stenosis: Secondary | ICD-10-CM | POA: Diagnosis not present

## 2019-07-29 DIAGNOSIS — Z794 Long term (current) use of insulin: Secondary | ICD-10-CM | POA: Diagnosis not present

## 2019-07-29 DIAGNOSIS — M8530 Osteitis condensans, unspecified site: Secondary | ICD-10-CM | POA: Diagnosis not present

## 2019-07-29 DIAGNOSIS — Z7901 Long term (current) use of anticoagulants: Secondary | ICD-10-CM | POA: Diagnosis not present

## 2019-07-29 DIAGNOSIS — E873 Alkalosis: Secondary | ICD-10-CM | POA: Diagnosis not present

## 2019-08-01 DIAGNOSIS — E871 Hypo-osmolality and hyponatremia: Secondary | ICD-10-CM | POA: Diagnosis not present

## 2019-08-01 DIAGNOSIS — E119 Type 2 diabetes mellitus without complications: Secondary | ICD-10-CM | POA: Diagnosis not present

## 2019-08-01 DIAGNOSIS — I350 Nonrheumatic aortic (valve) stenosis: Secondary | ICD-10-CM | POA: Diagnosis not present

## 2019-08-01 DIAGNOSIS — Z7901 Long term (current) use of anticoagulants: Secondary | ICD-10-CM | POA: Diagnosis not present

## 2019-08-01 DIAGNOSIS — M8530 Osteitis condensans, unspecified site: Secondary | ICD-10-CM | POA: Diagnosis not present

## 2019-08-01 DIAGNOSIS — N390 Urinary tract infection, site not specified: Secondary | ICD-10-CM | POA: Diagnosis not present

## 2019-08-01 DIAGNOSIS — Z96653 Presence of artificial knee joint, bilateral: Secondary | ICD-10-CM | POA: Diagnosis not present

## 2019-08-01 DIAGNOSIS — E873 Alkalosis: Secondary | ICD-10-CM | POA: Diagnosis not present

## 2019-08-01 DIAGNOSIS — Z794 Long term (current) use of insulin: Secondary | ICD-10-CM | POA: Diagnosis not present

## 2019-08-03 DIAGNOSIS — Z7901 Long term (current) use of anticoagulants: Secondary | ICD-10-CM | POA: Diagnosis not present

## 2019-08-03 DIAGNOSIS — M8530 Osteitis condensans, unspecified site: Secondary | ICD-10-CM | POA: Diagnosis not present

## 2019-08-03 DIAGNOSIS — N390 Urinary tract infection, site not specified: Secondary | ICD-10-CM | POA: Diagnosis not present

## 2019-08-03 DIAGNOSIS — E871 Hypo-osmolality and hyponatremia: Secondary | ICD-10-CM | POA: Diagnosis not present

## 2019-08-03 DIAGNOSIS — I350 Nonrheumatic aortic (valve) stenosis: Secondary | ICD-10-CM | POA: Diagnosis not present

## 2019-08-03 DIAGNOSIS — E119 Type 2 diabetes mellitus without complications: Secondary | ICD-10-CM | POA: Diagnosis not present

## 2019-08-03 DIAGNOSIS — Z794 Long term (current) use of insulin: Secondary | ICD-10-CM | POA: Diagnosis not present

## 2019-08-03 DIAGNOSIS — Z96653 Presence of artificial knee joint, bilateral: Secondary | ICD-10-CM | POA: Diagnosis not present

## 2019-08-03 DIAGNOSIS — E873 Alkalosis: Secondary | ICD-10-CM | POA: Diagnosis not present

## 2019-08-04 DIAGNOSIS — N184 Chronic kidney disease, stage 4 (severe): Secondary | ICD-10-CM | POA: Diagnosis not present

## 2019-08-09 DIAGNOSIS — N390 Urinary tract infection, site not specified: Secondary | ICD-10-CM | POA: Diagnosis not present

## 2019-08-09 DIAGNOSIS — E871 Hypo-osmolality and hyponatremia: Secondary | ICD-10-CM | POA: Diagnosis not present

## 2019-08-09 DIAGNOSIS — E873 Alkalosis: Secondary | ICD-10-CM | POA: Diagnosis not present

## 2019-08-09 DIAGNOSIS — R300 Dysuria: Secondary | ICD-10-CM | POA: Diagnosis not present

## 2019-08-10 DIAGNOSIS — E873 Alkalosis: Secondary | ICD-10-CM | POA: Diagnosis not present

## 2019-08-10 DIAGNOSIS — Z7901 Long term (current) use of anticoagulants: Secondary | ICD-10-CM | POA: Diagnosis not present

## 2019-08-10 DIAGNOSIS — Z96653 Presence of artificial knee joint, bilateral: Secondary | ICD-10-CM | POA: Diagnosis not present

## 2019-08-10 DIAGNOSIS — E871 Hypo-osmolality and hyponatremia: Secondary | ICD-10-CM | POA: Diagnosis not present

## 2019-08-10 DIAGNOSIS — I350 Nonrheumatic aortic (valve) stenosis: Secondary | ICD-10-CM | POA: Diagnosis not present

## 2019-08-10 DIAGNOSIS — N390 Urinary tract infection, site not specified: Secondary | ICD-10-CM | POA: Diagnosis not present

## 2019-08-10 DIAGNOSIS — E119 Type 2 diabetes mellitus without complications: Secondary | ICD-10-CM | POA: Diagnosis not present

## 2019-08-10 DIAGNOSIS — M8530 Osteitis condensans, unspecified site: Secondary | ICD-10-CM | POA: Diagnosis not present

## 2019-08-10 DIAGNOSIS — Z794 Long term (current) use of insulin: Secondary | ICD-10-CM | POA: Diagnosis not present

## 2019-08-15 DIAGNOSIS — E873 Alkalosis: Secondary | ICD-10-CM | POA: Diagnosis not present

## 2019-08-15 DIAGNOSIS — Z794 Long term (current) use of insulin: Secondary | ICD-10-CM | POA: Diagnosis not present

## 2019-08-15 DIAGNOSIS — Z96653 Presence of artificial knee joint, bilateral: Secondary | ICD-10-CM | POA: Diagnosis not present

## 2019-08-15 DIAGNOSIS — E871 Hypo-osmolality and hyponatremia: Secondary | ICD-10-CM | POA: Diagnosis not present

## 2019-08-15 DIAGNOSIS — E119 Type 2 diabetes mellitus without complications: Secondary | ICD-10-CM | POA: Diagnosis not present

## 2019-08-15 DIAGNOSIS — N390 Urinary tract infection, site not specified: Secondary | ICD-10-CM | POA: Diagnosis not present

## 2019-08-15 DIAGNOSIS — M8530 Osteitis condensans, unspecified site: Secondary | ICD-10-CM | POA: Diagnosis not present

## 2019-08-15 DIAGNOSIS — Z7901 Long term (current) use of anticoagulants: Secondary | ICD-10-CM | POA: Diagnosis not present

## 2019-08-15 DIAGNOSIS — I350 Nonrheumatic aortic (valve) stenosis: Secondary | ICD-10-CM | POA: Diagnosis not present

## 2019-08-17 DIAGNOSIS — E873 Alkalosis: Secondary | ICD-10-CM | POA: Diagnosis not present

## 2019-08-17 DIAGNOSIS — I350 Nonrheumatic aortic (valve) stenosis: Secondary | ICD-10-CM | POA: Diagnosis not present

## 2019-08-17 DIAGNOSIS — E119 Type 2 diabetes mellitus without complications: Secondary | ICD-10-CM | POA: Diagnosis not present

## 2019-08-17 DIAGNOSIS — Z794 Long term (current) use of insulin: Secondary | ICD-10-CM | POA: Diagnosis not present

## 2019-08-17 DIAGNOSIS — Z96653 Presence of artificial knee joint, bilateral: Secondary | ICD-10-CM | POA: Diagnosis not present

## 2019-08-17 DIAGNOSIS — E871 Hypo-osmolality and hyponatremia: Secondary | ICD-10-CM | POA: Diagnosis not present

## 2019-08-17 DIAGNOSIS — N390 Urinary tract infection, site not specified: Secondary | ICD-10-CM | POA: Diagnosis not present

## 2019-08-17 DIAGNOSIS — Z7901 Long term (current) use of anticoagulants: Secondary | ICD-10-CM | POA: Diagnosis not present

## 2019-08-17 DIAGNOSIS — M8530 Osteitis condensans, unspecified site: Secondary | ICD-10-CM | POA: Diagnosis not present

## 2019-08-18 DIAGNOSIS — Z96653 Presence of artificial knee joint, bilateral: Secondary | ICD-10-CM | POA: Diagnosis not present

## 2019-08-18 DIAGNOSIS — E871 Hypo-osmolality and hyponatremia: Secondary | ICD-10-CM | POA: Diagnosis not present

## 2019-08-18 DIAGNOSIS — M8530 Osteitis condensans, unspecified site: Secondary | ICD-10-CM | POA: Diagnosis not present

## 2019-08-18 DIAGNOSIS — N390 Urinary tract infection, site not specified: Secondary | ICD-10-CM | POA: Diagnosis not present

## 2019-08-18 DIAGNOSIS — Z7901 Long term (current) use of anticoagulants: Secondary | ICD-10-CM | POA: Diagnosis not present

## 2019-08-18 DIAGNOSIS — I350 Nonrheumatic aortic (valve) stenosis: Secondary | ICD-10-CM | POA: Diagnosis not present

## 2019-08-18 DIAGNOSIS — E873 Alkalosis: Secondary | ICD-10-CM | POA: Diagnosis not present

## 2019-08-18 DIAGNOSIS — Z794 Long term (current) use of insulin: Secondary | ICD-10-CM | POA: Diagnosis not present

## 2019-08-18 DIAGNOSIS — E119 Type 2 diabetes mellitus without complications: Secondary | ICD-10-CM | POA: Diagnosis not present

## 2019-08-22 DIAGNOSIS — E873 Alkalosis: Secondary | ICD-10-CM | POA: Diagnosis not present

## 2019-08-22 DIAGNOSIS — I350 Nonrheumatic aortic (valve) stenosis: Secondary | ICD-10-CM | POA: Diagnosis not present

## 2019-08-22 DIAGNOSIS — M8530 Osteitis condensans, unspecified site: Secondary | ICD-10-CM | POA: Diagnosis not present

## 2019-08-22 DIAGNOSIS — E871 Hypo-osmolality and hyponatremia: Secondary | ICD-10-CM | POA: Diagnosis not present

## 2019-08-22 DIAGNOSIS — Z7901 Long term (current) use of anticoagulants: Secondary | ICD-10-CM | POA: Diagnosis not present

## 2019-08-22 DIAGNOSIS — Z794 Long term (current) use of insulin: Secondary | ICD-10-CM | POA: Diagnosis not present

## 2019-08-22 DIAGNOSIS — N390 Urinary tract infection, site not specified: Secondary | ICD-10-CM | POA: Diagnosis not present

## 2019-08-22 DIAGNOSIS — Z96653 Presence of artificial knee joint, bilateral: Secondary | ICD-10-CM | POA: Diagnosis not present

## 2019-08-22 DIAGNOSIS — E119 Type 2 diabetes mellitus without complications: Secondary | ICD-10-CM | POA: Diagnosis not present

## 2019-08-25 DIAGNOSIS — Z96653 Presence of artificial knee joint, bilateral: Secondary | ICD-10-CM | POA: Diagnosis not present

## 2019-08-25 DIAGNOSIS — N390 Urinary tract infection, site not specified: Secondary | ICD-10-CM | POA: Diagnosis not present

## 2019-08-25 DIAGNOSIS — E119 Type 2 diabetes mellitus without complications: Secondary | ICD-10-CM | POA: Diagnosis not present

## 2019-08-25 DIAGNOSIS — Z794 Long term (current) use of insulin: Secondary | ICD-10-CM | POA: Diagnosis not present

## 2019-08-25 DIAGNOSIS — I350 Nonrheumatic aortic (valve) stenosis: Secondary | ICD-10-CM | POA: Diagnosis not present

## 2019-08-25 DIAGNOSIS — E871 Hypo-osmolality and hyponatremia: Secondary | ICD-10-CM | POA: Diagnosis not present

## 2019-08-25 DIAGNOSIS — R1110 Vomiting, unspecified: Secondary | ICD-10-CM | POA: Diagnosis not present

## 2019-08-25 DIAGNOSIS — M8530 Osteitis condensans, unspecified site: Secondary | ICD-10-CM | POA: Diagnosis not present

## 2019-08-25 DIAGNOSIS — E873 Alkalosis: Secondary | ICD-10-CM | POA: Diagnosis not present

## 2019-08-25 DIAGNOSIS — Z7901 Long term (current) use of anticoagulants: Secondary | ICD-10-CM | POA: Diagnosis not present

## 2019-08-26 DIAGNOSIS — R300 Dysuria: Secondary | ICD-10-CM | POA: Diagnosis not present

## 2019-08-26 DIAGNOSIS — E119 Type 2 diabetes mellitus without complications: Secondary | ICD-10-CM | POA: Diagnosis not present

## 2019-08-26 DIAGNOSIS — E873 Alkalosis: Secondary | ICD-10-CM | POA: Diagnosis not present

## 2019-08-26 DIAGNOSIS — I350 Nonrheumatic aortic (valve) stenosis: Secondary | ICD-10-CM | POA: Diagnosis not present

## 2019-08-26 DIAGNOSIS — N183 Chronic kidney disease, stage 3 unspecified: Secondary | ICD-10-CM | POA: Diagnosis not present

## 2019-08-26 DIAGNOSIS — Z794 Long term (current) use of insulin: Secondary | ICD-10-CM | POA: Diagnosis not present

## 2019-08-26 DIAGNOSIS — N390 Urinary tract infection, site not specified: Secondary | ICD-10-CM | POA: Diagnosis not present

## 2019-08-26 DIAGNOSIS — Z7901 Long term (current) use of anticoagulants: Secondary | ICD-10-CM | POA: Diagnosis not present

## 2019-08-26 DIAGNOSIS — M8530 Osteitis condensans, unspecified site: Secondary | ICD-10-CM | POA: Diagnosis not present

## 2019-08-26 DIAGNOSIS — Z96653 Presence of artificial knee joint, bilateral: Secondary | ICD-10-CM | POA: Diagnosis not present

## 2019-08-26 DIAGNOSIS — E871 Hypo-osmolality and hyponatremia: Secondary | ICD-10-CM | POA: Diagnosis not present

## 2019-08-29 ENCOUNTER — Other Ambulatory Visit: Payer: Self-pay | Admitting: Gastroenterology

## 2019-08-29 DIAGNOSIS — E873 Alkalosis: Secondary | ICD-10-CM | POA: Diagnosis not present

## 2019-08-29 DIAGNOSIS — M8530 Osteitis condensans, unspecified site: Secondary | ICD-10-CM | POA: Diagnosis not present

## 2019-08-29 DIAGNOSIS — Z8719 Personal history of other diseases of the digestive system: Secondary | ICD-10-CM

## 2019-08-29 DIAGNOSIS — R748 Abnormal levels of other serum enzymes: Secondary | ICD-10-CM | POA: Diagnosis not present

## 2019-08-29 DIAGNOSIS — Z96653 Presence of artificial knee joint, bilateral: Secondary | ICD-10-CM | POA: Diagnosis not present

## 2019-08-29 DIAGNOSIS — I350 Nonrheumatic aortic (valve) stenosis: Secondary | ICD-10-CM | POA: Diagnosis not present

## 2019-08-29 DIAGNOSIS — E871 Hypo-osmolality and hyponatremia: Secondary | ICD-10-CM | POA: Diagnosis not present

## 2019-08-29 DIAGNOSIS — R112 Nausea with vomiting, unspecified: Secondary | ICD-10-CM | POA: Diagnosis not present

## 2019-08-29 DIAGNOSIS — I4821 Permanent atrial fibrillation: Secondary | ICD-10-CM | POA: Diagnosis not present

## 2019-08-29 DIAGNOSIS — Z7901 Long term (current) use of anticoagulants: Secondary | ICD-10-CM | POA: Diagnosis not present

## 2019-08-29 DIAGNOSIS — Z794 Long term (current) use of insulin: Secondary | ICD-10-CM | POA: Diagnosis not present

## 2019-08-29 DIAGNOSIS — E119 Type 2 diabetes mellitus without complications: Secondary | ICD-10-CM | POA: Diagnosis not present

## 2019-08-29 DIAGNOSIS — N390 Urinary tract infection, site not specified: Secondary | ICD-10-CM | POA: Diagnosis not present

## 2019-08-30 DIAGNOSIS — N3090 Cystitis, unspecified without hematuria: Secondary | ICD-10-CM | POA: Diagnosis not present

## 2019-08-31 DIAGNOSIS — Z7901 Long term (current) use of anticoagulants: Secondary | ICD-10-CM | POA: Diagnosis not present

## 2019-08-31 DIAGNOSIS — E873 Alkalosis: Secondary | ICD-10-CM | POA: Diagnosis not present

## 2019-08-31 DIAGNOSIS — M8530 Osteitis condensans, unspecified site: Secondary | ICD-10-CM | POA: Diagnosis not present

## 2019-08-31 DIAGNOSIS — I350 Nonrheumatic aortic (valve) stenosis: Secondary | ICD-10-CM | POA: Diagnosis not present

## 2019-08-31 DIAGNOSIS — Z794 Long term (current) use of insulin: Secondary | ICD-10-CM | POA: Diagnosis not present

## 2019-08-31 DIAGNOSIS — E871 Hypo-osmolality and hyponatremia: Secondary | ICD-10-CM | POA: Diagnosis not present

## 2019-08-31 DIAGNOSIS — Z96653 Presence of artificial knee joint, bilateral: Secondary | ICD-10-CM | POA: Diagnosis not present

## 2019-08-31 DIAGNOSIS — E119 Type 2 diabetes mellitus without complications: Secondary | ICD-10-CM | POA: Diagnosis not present

## 2019-08-31 DIAGNOSIS — N390 Urinary tract infection, site not specified: Secondary | ICD-10-CM | POA: Diagnosis not present

## 2019-09-01 DIAGNOSIS — K219 Gastro-esophageal reflux disease without esophagitis: Secondary | ICD-10-CM | POA: Diagnosis not present

## 2019-09-01 DIAGNOSIS — R42 Dizziness and giddiness: Secondary | ICD-10-CM | POA: Diagnosis not present

## 2019-09-01 DIAGNOSIS — I10 Essential (primary) hypertension: Secondary | ICD-10-CM | POA: Diagnosis not present

## 2019-09-01 DIAGNOSIS — Z7901 Long term (current) use of anticoagulants: Secondary | ICD-10-CM | POA: Diagnosis not present

## 2019-09-01 DIAGNOSIS — E871 Hypo-osmolality and hyponatremia: Secondary | ICD-10-CM | POA: Diagnosis not present

## 2019-09-01 DIAGNOSIS — N390 Urinary tract infection, site not specified: Secondary | ICD-10-CM | POA: Diagnosis not present

## 2019-09-01 DIAGNOSIS — Z794 Long term (current) use of insulin: Secondary | ICD-10-CM | POA: Diagnosis not present

## 2019-09-01 DIAGNOSIS — E782 Mixed hyperlipidemia: Secondary | ICD-10-CM | POA: Diagnosis not present

## 2019-09-01 DIAGNOSIS — I350 Nonrheumatic aortic (valve) stenosis: Secondary | ICD-10-CM | POA: Diagnosis not present

## 2019-09-01 DIAGNOSIS — R5383 Other fatigue: Secondary | ICD-10-CM | POA: Diagnosis not present

## 2019-09-01 DIAGNOSIS — E1122 Type 2 diabetes mellitus with diabetic chronic kidney disease: Secondary | ICD-10-CM | POA: Diagnosis not present

## 2019-09-01 DIAGNOSIS — Z96653 Presence of artificial knee joint, bilateral: Secondary | ICD-10-CM | POA: Diagnosis not present

## 2019-09-01 DIAGNOSIS — N184 Chronic kidney disease, stage 4 (severe): Secondary | ICD-10-CM | POA: Diagnosis not present

## 2019-09-01 DIAGNOSIS — E119 Type 2 diabetes mellitus without complications: Secondary | ICD-10-CM | POA: Diagnosis not present

## 2019-09-01 DIAGNOSIS — D649 Anemia, unspecified: Secondary | ICD-10-CM | POA: Diagnosis not present

## 2019-09-01 DIAGNOSIS — M8530 Osteitis condensans, unspecified site: Secondary | ICD-10-CM | POA: Diagnosis not present

## 2019-09-01 DIAGNOSIS — E873 Alkalosis: Secondary | ICD-10-CM | POA: Diagnosis not present

## 2019-09-01 DIAGNOSIS — R748 Abnormal levels of other serum enzymes: Secondary | ICD-10-CM | POA: Diagnosis not present

## 2019-09-05 ENCOUNTER — Other Ambulatory Visit (HOSPITAL_COMMUNITY): Payer: Self-pay | Admitting: *Deleted

## 2019-09-05 DIAGNOSIS — Z794 Long term (current) use of insulin: Secondary | ICD-10-CM | POA: Diagnosis not present

## 2019-09-05 DIAGNOSIS — E119 Type 2 diabetes mellitus without complications: Secondary | ICD-10-CM | POA: Diagnosis not present

## 2019-09-05 DIAGNOSIS — E873 Alkalosis: Secondary | ICD-10-CM | POA: Diagnosis not present

## 2019-09-05 DIAGNOSIS — M8530 Osteitis condensans, unspecified site: Secondary | ICD-10-CM | POA: Diagnosis not present

## 2019-09-05 DIAGNOSIS — Z7901 Long term (current) use of anticoagulants: Secondary | ICD-10-CM | POA: Diagnosis not present

## 2019-09-05 DIAGNOSIS — E871 Hypo-osmolality and hyponatremia: Secondary | ICD-10-CM | POA: Diagnosis not present

## 2019-09-05 DIAGNOSIS — Z96653 Presence of artificial knee joint, bilateral: Secondary | ICD-10-CM | POA: Diagnosis not present

## 2019-09-05 DIAGNOSIS — I350 Nonrheumatic aortic (valve) stenosis: Secondary | ICD-10-CM | POA: Diagnosis not present

## 2019-09-05 DIAGNOSIS — N390 Urinary tract infection, site not specified: Secondary | ICD-10-CM | POA: Diagnosis not present

## 2019-09-05 MED ORDER — CARVEDILOL 12.5 MG PO TABS: ORAL_TABLET | ORAL | 0 refills | Status: DC

## 2019-09-06 ENCOUNTER — Other Ambulatory Visit: Payer: Medicare HMO

## 2019-09-07 DIAGNOSIS — E782 Mixed hyperlipidemia: Secondary | ICD-10-CM | POA: Diagnosis not present

## 2019-09-07 DIAGNOSIS — I5032 Chronic diastolic (congestive) heart failure: Secondary | ICD-10-CM | POA: Diagnosis not present

## 2019-09-07 DIAGNOSIS — E1122 Type 2 diabetes mellitus with diabetic chronic kidney disease: Secondary | ICD-10-CM | POA: Diagnosis not present

## 2019-09-07 DIAGNOSIS — D649 Anemia, unspecified: Secondary | ICD-10-CM | POA: Diagnosis not present

## 2019-09-07 DIAGNOSIS — R4582 Worries: Secondary | ICD-10-CM | POA: Diagnosis not present

## 2019-09-07 DIAGNOSIS — I69351 Hemiplegia and hemiparesis following cerebral infarction affecting right dominant side: Secondary | ICD-10-CM | POA: Diagnosis not present

## 2019-09-07 DIAGNOSIS — I10 Essential (primary) hypertension: Secondary | ICD-10-CM | POA: Diagnosis not present

## 2019-09-07 DIAGNOSIS — G301 Alzheimer's disease with late onset: Secondary | ICD-10-CM | POA: Diagnosis not present

## 2019-09-12 DIAGNOSIS — Z7901 Long term (current) use of anticoagulants: Secondary | ICD-10-CM | POA: Diagnosis not present

## 2019-09-12 DIAGNOSIS — I350 Nonrheumatic aortic (valve) stenosis: Secondary | ICD-10-CM | POA: Diagnosis not present

## 2019-09-12 DIAGNOSIS — M8530 Osteitis condensans, unspecified site: Secondary | ICD-10-CM | POA: Diagnosis not present

## 2019-09-12 DIAGNOSIS — E871 Hypo-osmolality and hyponatremia: Secondary | ICD-10-CM | POA: Diagnosis not present

## 2019-09-12 DIAGNOSIS — E873 Alkalosis: Secondary | ICD-10-CM | POA: Diagnosis not present

## 2019-09-12 DIAGNOSIS — E119 Type 2 diabetes mellitus without complications: Secondary | ICD-10-CM | POA: Diagnosis not present

## 2019-09-12 DIAGNOSIS — Z96653 Presence of artificial knee joint, bilateral: Secondary | ICD-10-CM | POA: Diagnosis not present

## 2019-09-12 DIAGNOSIS — N390 Urinary tract infection, site not specified: Secondary | ICD-10-CM | POA: Diagnosis not present

## 2019-09-12 DIAGNOSIS — Z794 Long term (current) use of insulin: Secondary | ICD-10-CM | POA: Diagnosis not present

## 2019-09-13 DIAGNOSIS — N3090 Cystitis, unspecified without hematuria: Secondary | ICD-10-CM | POA: Diagnosis not present

## 2019-09-13 DIAGNOSIS — R300 Dysuria: Secondary | ICD-10-CM | POA: Diagnosis not present

## 2019-09-14 DIAGNOSIS — M8530 Osteitis condensans, unspecified site: Secondary | ICD-10-CM | POA: Diagnosis not present

## 2019-09-14 DIAGNOSIS — N184 Chronic kidney disease, stage 4 (severe): Secondary | ICD-10-CM | POA: Diagnosis not present

## 2019-09-14 DIAGNOSIS — E871 Hypo-osmolality and hyponatremia: Secondary | ICD-10-CM | POA: Diagnosis not present

## 2019-09-14 DIAGNOSIS — Z96653 Presence of artificial knee joint, bilateral: Secondary | ICD-10-CM | POA: Diagnosis not present

## 2019-09-14 DIAGNOSIS — Z7901 Long term (current) use of anticoagulants: Secondary | ICD-10-CM | POA: Diagnosis not present

## 2019-09-14 DIAGNOSIS — E782 Mixed hyperlipidemia: Secondary | ICD-10-CM | POA: Diagnosis not present

## 2019-09-14 DIAGNOSIS — Z794 Long term (current) use of insulin: Secondary | ICD-10-CM | POA: Diagnosis not present

## 2019-09-14 DIAGNOSIS — N390 Urinary tract infection, site not specified: Secondary | ICD-10-CM | POA: Diagnosis not present

## 2019-09-14 DIAGNOSIS — E119 Type 2 diabetes mellitus without complications: Secondary | ICD-10-CM | POA: Diagnosis not present

## 2019-09-14 DIAGNOSIS — I350 Nonrheumatic aortic (valve) stenosis: Secondary | ICD-10-CM | POA: Diagnosis not present

## 2019-09-14 DIAGNOSIS — E873 Alkalosis: Secondary | ICD-10-CM | POA: Diagnosis not present

## 2019-09-15 DIAGNOSIS — N3090 Cystitis, unspecified without hematuria: Secondary | ICD-10-CM | POA: Diagnosis not present

## 2019-09-15 DIAGNOSIS — R300 Dysuria: Secondary | ICD-10-CM | POA: Diagnosis not present

## 2019-09-19 DIAGNOSIS — M8530 Osteitis condensans, unspecified site: Secondary | ICD-10-CM | POA: Diagnosis not present

## 2019-09-19 DIAGNOSIS — Z794 Long term (current) use of insulin: Secondary | ICD-10-CM | POA: Diagnosis not present

## 2019-09-19 DIAGNOSIS — Z96653 Presence of artificial knee joint, bilateral: Secondary | ICD-10-CM | POA: Diagnosis not present

## 2019-09-19 DIAGNOSIS — N390 Urinary tract infection, site not specified: Secondary | ICD-10-CM | POA: Diagnosis not present

## 2019-09-19 DIAGNOSIS — I350 Nonrheumatic aortic (valve) stenosis: Secondary | ICD-10-CM | POA: Diagnosis not present

## 2019-09-19 DIAGNOSIS — Z7901 Long term (current) use of anticoagulants: Secondary | ICD-10-CM | POA: Diagnosis not present

## 2019-09-19 DIAGNOSIS — E119 Type 2 diabetes mellitus without complications: Secondary | ICD-10-CM | POA: Diagnosis not present

## 2019-09-19 DIAGNOSIS — E871 Hypo-osmolality and hyponatremia: Secondary | ICD-10-CM | POA: Diagnosis not present

## 2019-09-21 DIAGNOSIS — M8530 Osteitis condensans, unspecified site: Secondary | ICD-10-CM | POA: Diagnosis not present

## 2019-09-21 DIAGNOSIS — I350 Nonrheumatic aortic (valve) stenosis: Secondary | ICD-10-CM | POA: Diagnosis not present

## 2019-09-21 DIAGNOSIS — E871 Hypo-osmolality and hyponatremia: Secondary | ICD-10-CM | POA: Diagnosis not present

## 2019-09-21 DIAGNOSIS — Z794 Long term (current) use of insulin: Secondary | ICD-10-CM | POA: Diagnosis not present

## 2019-09-21 DIAGNOSIS — E119 Type 2 diabetes mellitus without complications: Secondary | ICD-10-CM | POA: Diagnosis not present

## 2019-09-21 DIAGNOSIS — Z7901 Long term (current) use of anticoagulants: Secondary | ICD-10-CM | POA: Diagnosis not present

## 2019-09-21 DIAGNOSIS — N390 Urinary tract infection, site not specified: Secondary | ICD-10-CM | POA: Diagnosis not present

## 2019-09-21 DIAGNOSIS — Z96653 Presence of artificial knee joint, bilateral: Secondary | ICD-10-CM | POA: Diagnosis not present

## 2019-09-26 DIAGNOSIS — Z7901 Long term (current) use of anticoagulants: Secondary | ICD-10-CM | POA: Diagnosis not present

## 2019-09-26 DIAGNOSIS — I350 Nonrheumatic aortic (valve) stenosis: Secondary | ICD-10-CM | POA: Diagnosis not present

## 2019-09-26 DIAGNOSIS — E119 Type 2 diabetes mellitus without complications: Secondary | ICD-10-CM | POA: Diagnosis not present

## 2019-09-26 DIAGNOSIS — Z96653 Presence of artificial knee joint, bilateral: Secondary | ICD-10-CM | POA: Diagnosis not present

## 2019-09-26 DIAGNOSIS — M8530 Osteitis condensans, unspecified site: Secondary | ICD-10-CM | POA: Diagnosis not present

## 2019-09-26 DIAGNOSIS — N390 Urinary tract infection, site not specified: Secondary | ICD-10-CM | POA: Diagnosis not present

## 2019-09-26 DIAGNOSIS — Z794 Long term (current) use of insulin: Secondary | ICD-10-CM | POA: Diagnosis not present

## 2019-09-26 DIAGNOSIS — E871 Hypo-osmolality and hyponatremia: Secondary | ICD-10-CM | POA: Diagnosis not present

## 2019-09-27 DIAGNOSIS — E871 Hypo-osmolality and hyponatremia: Secondary | ICD-10-CM | POA: Diagnosis not present

## 2019-09-27 DIAGNOSIS — Z7901 Long term (current) use of anticoagulants: Secondary | ICD-10-CM | POA: Diagnosis not present

## 2019-09-27 DIAGNOSIS — I350 Nonrheumatic aortic (valve) stenosis: Secondary | ICD-10-CM | POA: Diagnosis not present

## 2019-09-27 DIAGNOSIS — Z96653 Presence of artificial knee joint, bilateral: Secondary | ICD-10-CM | POA: Diagnosis not present

## 2019-09-27 DIAGNOSIS — Z794 Long term (current) use of insulin: Secondary | ICD-10-CM | POA: Diagnosis not present

## 2019-09-27 DIAGNOSIS — E119 Type 2 diabetes mellitus without complications: Secondary | ICD-10-CM | POA: Diagnosis not present

## 2019-09-27 DIAGNOSIS — M8530 Osteitis condensans, unspecified site: Secondary | ICD-10-CM | POA: Diagnosis not present

## 2019-09-27 DIAGNOSIS — N390 Urinary tract infection, site not specified: Secondary | ICD-10-CM | POA: Diagnosis not present

## 2019-09-28 DIAGNOSIS — E119 Type 2 diabetes mellitus without complications: Secondary | ICD-10-CM | POA: Diagnosis not present

## 2019-09-28 DIAGNOSIS — Z7901 Long term (current) use of anticoagulants: Secondary | ICD-10-CM | POA: Diagnosis not present

## 2019-09-28 DIAGNOSIS — N390 Urinary tract infection, site not specified: Secondary | ICD-10-CM | POA: Diagnosis not present

## 2019-09-28 DIAGNOSIS — Z96653 Presence of artificial knee joint, bilateral: Secondary | ICD-10-CM | POA: Diagnosis not present

## 2019-09-28 DIAGNOSIS — E871 Hypo-osmolality and hyponatremia: Secondary | ICD-10-CM | POA: Diagnosis not present

## 2019-09-28 DIAGNOSIS — Z794 Long term (current) use of insulin: Secondary | ICD-10-CM | POA: Diagnosis not present

## 2019-09-28 DIAGNOSIS — M8530 Osteitis condensans, unspecified site: Secondary | ICD-10-CM | POA: Diagnosis not present

## 2019-09-28 DIAGNOSIS — I350 Nonrheumatic aortic (valve) stenosis: Secondary | ICD-10-CM | POA: Diagnosis not present

## 2019-09-29 ENCOUNTER — Other Ambulatory Visit: Payer: Medicare HMO

## 2019-10-03 DIAGNOSIS — Z96653 Presence of artificial knee joint, bilateral: Secondary | ICD-10-CM | POA: Diagnosis not present

## 2019-10-03 DIAGNOSIS — M8530 Osteitis condensans, unspecified site: Secondary | ICD-10-CM | POA: Diagnosis not present

## 2019-10-03 DIAGNOSIS — Z7901 Long term (current) use of anticoagulants: Secondary | ICD-10-CM | POA: Diagnosis not present

## 2019-10-03 DIAGNOSIS — Z794 Long term (current) use of insulin: Secondary | ICD-10-CM | POA: Diagnosis not present

## 2019-10-03 DIAGNOSIS — N390 Urinary tract infection, site not specified: Secondary | ICD-10-CM | POA: Diagnosis not present

## 2019-10-03 DIAGNOSIS — E871 Hypo-osmolality and hyponatremia: Secondary | ICD-10-CM | POA: Diagnosis not present

## 2019-10-03 DIAGNOSIS — I350 Nonrheumatic aortic (valve) stenosis: Secondary | ICD-10-CM | POA: Diagnosis not present

## 2019-10-03 DIAGNOSIS — E119 Type 2 diabetes mellitus without complications: Secondary | ICD-10-CM | POA: Diagnosis not present

## 2019-10-04 DIAGNOSIS — N390 Urinary tract infection, site not specified: Secondary | ICD-10-CM | POA: Diagnosis not present

## 2019-10-04 DIAGNOSIS — M8530 Osteitis condensans, unspecified site: Secondary | ICD-10-CM | POA: Diagnosis not present

## 2019-10-04 DIAGNOSIS — E871 Hypo-osmolality and hyponatremia: Secondary | ICD-10-CM | POA: Diagnosis not present

## 2019-10-04 DIAGNOSIS — I350 Nonrheumatic aortic (valve) stenosis: Secondary | ICD-10-CM | POA: Diagnosis not present

## 2019-10-04 DIAGNOSIS — Z794 Long term (current) use of insulin: Secondary | ICD-10-CM | POA: Diagnosis not present

## 2019-10-04 DIAGNOSIS — Z96653 Presence of artificial knee joint, bilateral: Secondary | ICD-10-CM | POA: Diagnosis not present

## 2019-10-04 DIAGNOSIS — E119 Type 2 diabetes mellitus without complications: Secondary | ICD-10-CM | POA: Diagnosis not present

## 2019-10-04 DIAGNOSIS — Z7901 Long term (current) use of anticoagulants: Secondary | ICD-10-CM | POA: Diagnosis not present

## 2019-10-05 DIAGNOSIS — M8530 Osteitis condensans, unspecified site: Secondary | ICD-10-CM | POA: Diagnosis not present

## 2019-10-05 DIAGNOSIS — N390 Urinary tract infection, site not specified: Secondary | ICD-10-CM | POA: Diagnosis not present

## 2019-10-05 DIAGNOSIS — Z794 Long term (current) use of insulin: Secondary | ICD-10-CM | POA: Diagnosis not present

## 2019-10-05 DIAGNOSIS — I350 Nonrheumatic aortic (valve) stenosis: Secondary | ICD-10-CM | POA: Diagnosis not present

## 2019-10-05 DIAGNOSIS — E119 Type 2 diabetes mellitus without complications: Secondary | ICD-10-CM | POA: Diagnosis not present

## 2019-10-05 DIAGNOSIS — Z96653 Presence of artificial knee joint, bilateral: Secondary | ICD-10-CM | POA: Diagnosis not present

## 2019-10-05 DIAGNOSIS — E871 Hypo-osmolality and hyponatremia: Secondary | ICD-10-CM | POA: Diagnosis not present

## 2019-10-05 DIAGNOSIS — Z7901 Long term (current) use of anticoagulants: Secondary | ICD-10-CM | POA: Diagnosis not present

## 2019-10-07 ENCOUNTER — Other Ambulatory Visit (HOSPITAL_COMMUNITY): Payer: Self-pay | Admitting: *Deleted

## 2019-10-07 MED ORDER — CARVEDILOL 12.5 MG PO TABS: ORAL_TABLET | ORAL | 0 refills | Status: DC

## 2019-10-10 DIAGNOSIS — Z794 Long term (current) use of insulin: Secondary | ICD-10-CM | POA: Diagnosis not present

## 2019-10-10 DIAGNOSIS — Z7901 Long term (current) use of anticoagulants: Secondary | ICD-10-CM | POA: Diagnosis not present

## 2019-10-10 DIAGNOSIS — I350 Nonrheumatic aortic (valve) stenosis: Secondary | ICD-10-CM | POA: Diagnosis not present

## 2019-10-10 DIAGNOSIS — E871 Hypo-osmolality and hyponatremia: Secondary | ICD-10-CM | POA: Diagnosis not present

## 2019-10-10 DIAGNOSIS — Z96653 Presence of artificial knee joint, bilateral: Secondary | ICD-10-CM | POA: Diagnosis not present

## 2019-10-10 DIAGNOSIS — N390 Urinary tract infection, site not specified: Secondary | ICD-10-CM | POA: Diagnosis not present

## 2019-10-10 DIAGNOSIS — M8530 Osteitis condensans, unspecified site: Secondary | ICD-10-CM | POA: Diagnosis not present

## 2019-10-10 DIAGNOSIS — E119 Type 2 diabetes mellitus without complications: Secondary | ICD-10-CM | POA: Diagnosis not present

## 2019-10-11 DIAGNOSIS — Z794 Long term (current) use of insulin: Secondary | ICD-10-CM | POA: Diagnosis not present

## 2019-10-11 DIAGNOSIS — N390 Urinary tract infection, site not specified: Secondary | ICD-10-CM | POA: Diagnosis not present

## 2019-10-11 DIAGNOSIS — E871 Hypo-osmolality and hyponatremia: Secondary | ICD-10-CM | POA: Diagnosis not present

## 2019-10-11 DIAGNOSIS — I350 Nonrheumatic aortic (valve) stenosis: Secondary | ICD-10-CM | POA: Diagnosis not present

## 2019-10-11 DIAGNOSIS — Z7901 Long term (current) use of anticoagulants: Secondary | ICD-10-CM | POA: Diagnosis not present

## 2019-10-11 DIAGNOSIS — Z96653 Presence of artificial knee joint, bilateral: Secondary | ICD-10-CM | POA: Diagnosis not present

## 2019-10-11 DIAGNOSIS — E119 Type 2 diabetes mellitus without complications: Secondary | ICD-10-CM | POA: Diagnosis not present

## 2019-10-11 DIAGNOSIS — M8530 Osteitis condensans, unspecified site: Secondary | ICD-10-CM | POA: Diagnosis not present

## 2019-10-12 DIAGNOSIS — Z794 Long term (current) use of insulin: Secondary | ICD-10-CM | POA: Diagnosis not present

## 2019-10-12 DIAGNOSIS — E871 Hypo-osmolality and hyponatremia: Secondary | ICD-10-CM | POA: Diagnosis not present

## 2019-10-12 DIAGNOSIS — M8530 Osteitis condensans, unspecified site: Secondary | ICD-10-CM | POA: Diagnosis not present

## 2019-10-12 DIAGNOSIS — Z96653 Presence of artificial knee joint, bilateral: Secondary | ICD-10-CM | POA: Diagnosis not present

## 2019-10-12 DIAGNOSIS — Z7901 Long term (current) use of anticoagulants: Secondary | ICD-10-CM | POA: Diagnosis not present

## 2019-10-12 DIAGNOSIS — E119 Type 2 diabetes mellitus without complications: Secondary | ICD-10-CM | POA: Diagnosis not present

## 2019-10-12 DIAGNOSIS — I350 Nonrheumatic aortic (valve) stenosis: Secondary | ICD-10-CM | POA: Diagnosis not present

## 2019-10-12 DIAGNOSIS — N390 Urinary tract infection, site not specified: Secondary | ICD-10-CM | POA: Diagnosis not present

## 2019-10-13 ENCOUNTER — Ambulatory Visit (HOSPITAL_COMMUNITY): Payer: Medicare HMO | Admitting: Nurse Practitioner

## 2019-10-14 DIAGNOSIS — E7849 Other hyperlipidemia: Secondary | ICD-10-CM | POA: Diagnosis not present

## 2019-10-14 DIAGNOSIS — I10 Essential (primary) hypertension: Secondary | ICD-10-CM | POA: Diagnosis not present

## 2019-10-17 DIAGNOSIS — Z794 Long term (current) use of insulin: Secondary | ICD-10-CM | POA: Diagnosis not present

## 2019-10-17 DIAGNOSIS — E871 Hypo-osmolality and hyponatremia: Secondary | ICD-10-CM | POA: Diagnosis not present

## 2019-10-17 DIAGNOSIS — M8530 Osteitis condensans, unspecified site: Secondary | ICD-10-CM | POA: Diagnosis not present

## 2019-10-17 DIAGNOSIS — I350 Nonrheumatic aortic (valve) stenosis: Secondary | ICD-10-CM | POA: Diagnosis not present

## 2019-10-17 DIAGNOSIS — Z96653 Presence of artificial knee joint, bilateral: Secondary | ICD-10-CM | POA: Diagnosis not present

## 2019-10-17 DIAGNOSIS — N390 Urinary tract infection, site not specified: Secondary | ICD-10-CM | POA: Diagnosis not present

## 2019-10-17 DIAGNOSIS — Z7901 Long term (current) use of anticoagulants: Secondary | ICD-10-CM | POA: Diagnosis not present

## 2019-10-17 DIAGNOSIS — E119 Type 2 diabetes mellitus without complications: Secondary | ICD-10-CM | POA: Diagnosis not present

## 2019-10-20 ENCOUNTER — Ambulatory Visit (HOSPITAL_COMMUNITY): Payer: Medicare HMO | Admitting: Nurse Practitioner

## 2019-10-21 DIAGNOSIS — E871 Hypo-osmolality and hyponatremia: Secondary | ICD-10-CM | POA: Diagnosis not present

## 2019-10-21 DIAGNOSIS — Z96653 Presence of artificial knee joint, bilateral: Secondary | ICD-10-CM | POA: Diagnosis not present

## 2019-10-21 DIAGNOSIS — M8530 Osteitis condensans, unspecified site: Secondary | ICD-10-CM | POA: Diagnosis not present

## 2019-10-21 DIAGNOSIS — Z7901 Long term (current) use of anticoagulants: Secondary | ICD-10-CM | POA: Diagnosis not present

## 2019-10-21 DIAGNOSIS — N390 Urinary tract infection, site not specified: Secondary | ICD-10-CM | POA: Diagnosis not present

## 2019-10-21 DIAGNOSIS — I350 Nonrheumatic aortic (valve) stenosis: Secondary | ICD-10-CM | POA: Diagnosis not present

## 2019-10-21 DIAGNOSIS — E119 Type 2 diabetes mellitus without complications: Secondary | ICD-10-CM | POA: Diagnosis not present

## 2019-10-21 DIAGNOSIS — Z794 Long term (current) use of insulin: Secondary | ICD-10-CM | POA: Diagnosis not present

## 2019-10-24 DIAGNOSIS — N390 Urinary tract infection, site not specified: Secondary | ICD-10-CM | POA: Diagnosis not present

## 2019-10-24 DIAGNOSIS — I350 Nonrheumatic aortic (valve) stenosis: Secondary | ICD-10-CM | POA: Diagnosis not present

## 2019-10-24 DIAGNOSIS — Z7901 Long term (current) use of anticoagulants: Secondary | ICD-10-CM | POA: Diagnosis not present

## 2019-10-24 DIAGNOSIS — E119 Type 2 diabetes mellitus without complications: Secondary | ICD-10-CM | POA: Diagnosis not present

## 2019-10-24 DIAGNOSIS — Z794 Long term (current) use of insulin: Secondary | ICD-10-CM | POA: Diagnosis not present

## 2019-10-24 DIAGNOSIS — M8530 Osteitis condensans, unspecified site: Secondary | ICD-10-CM | POA: Diagnosis not present

## 2019-10-24 DIAGNOSIS — E871 Hypo-osmolality and hyponatremia: Secondary | ICD-10-CM | POA: Diagnosis not present

## 2019-10-24 DIAGNOSIS — Z96653 Presence of artificial knee joint, bilateral: Secondary | ICD-10-CM | POA: Diagnosis not present

## 2019-10-25 DIAGNOSIS — I350 Nonrheumatic aortic (valve) stenosis: Secondary | ICD-10-CM | POA: Diagnosis not present

## 2019-10-25 DIAGNOSIS — E871 Hypo-osmolality and hyponatremia: Secondary | ICD-10-CM | POA: Diagnosis not present

## 2019-10-25 DIAGNOSIS — Z96653 Presence of artificial knee joint, bilateral: Secondary | ICD-10-CM | POA: Diagnosis not present

## 2019-10-25 DIAGNOSIS — M8530 Osteitis condensans, unspecified site: Secondary | ICD-10-CM | POA: Diagnosis not present

## 2019-10-25 DIAGNOSIS — E119 Type 2 diabetes mellitus without complications: Secondary | ICD-10-CM | POA: Diagnosis not present

## 2019-10-25 DIAGNOSIS — Z7901 Long term (current) use of anticoagulants: Secondary | ICD-10-CM | POA: Diagnosis not present

## 2019-10-25 DIAGNOSIS — N390 Urinary tract infection, site not specified: Secondary | ICD-10-CM | POA: Diagnosis not present

## 2019-10-25 DIAGNOSIS — Z794 Long term (current) use of insulin: Secondary | ICD-10-CM | POA: Diagnosis not present

## 2019-10-27 ENCOUNTER — Encounter (HOSPITAL_COMMUNITY): Payer: Self-pay | Admitting: Physician Assistant

## 2019-10-27 ENCOUNTER — Ambulatory Visit (HOSPITAL_COMMUNITY)
Admission: RE | Admit: 2019-10-27 | Discharge: 2019-10-27 | Disposition: A | Discharge status: Home / Self Care | Payer: Medicare HMO | Source: Ambulatory Visit | Attending: Physician Assistant | Admitting: Physician Assistant

## 2019-10-27 ENCOUNTER — Other Ambulatory Visit: Payer: Self-pay

## 2019-10-27 ENCOUNTER — Ambulatory Visit (HOSPITAL_COMMUNITY): Payer: Medicare HMO | Admitting: Nurse Practitioner

## 2019-10-27 VITALS — BP 132/92 | HR 110 | Ht 62.0 in | Wt 153.0 lb

## 2019-10-27 DIAGNOSIS — Z806 Family history of leukemia: Secondary | ICD-10-CM | POA: Diagnosis not present

## 2019-10-27 DIAGNOSIS — Z8249 Family history of ischemic heart disease and other diseases of the circulatory system: Secondary | ICD-10-CM | POA: Insufficient documentation

## 2019-10-27 DIAGNOSIS — I358 Other nonrheumatic aortic valve disorders: Secondary | ICD-10-CM | POA: Insufficient documentation

## 2019-10-27 DIAGNOSIS — Z79899 Other long term (current) drug therapy: Secondary | ICD-10-CM | POA: Diagnosis not present

## 2019-10-27 DIAGNOSIS — D631 Anemia in chronic kidney disease: Secondary | ICD-10-CM | POA: Insufficient documentation

## 2019-10-27 DIAGNOSIS — Z9049 Acquired absence of other specified parts of digestive tract: Secondary | ICD-10-CM | POA: Diagnosis not present

## 2019-10-27 DIAGNOSIS — I484 Atypical atrial flutter: Secondary | ICD-10-CM | POA: Diagnosis not present

## 2019-10-27 DIAGNOSIS — E785 Hyperlipidemia, unspecified: Secondary | ICD-10-CM | POA: Insufficient documentation

## 2019-10-27 DIAGNOSIS — Z9842 Cataract extraction status, left eye: Secondary | ICD-10-CM | POA: Insufficient documentation

## 2019-10-27 DIAGNOSIS — Z90710 Acquired absence of both cervix and uterus: Secondary | ICD-10-CM | POA: Diagnosis not present

## 2019-10-27 DIAGNOSIS — G894 Chronic pain syndrome: Secondary | ICD-10-CM | POA: Diagnosis not present

## 2019-10-27 DIAGNOSIS — I4819 Other persistent atrial fibrillation: Secondary | ICD-10-CM | POA: Diagnosis not present

## 2019-10-27 DIAGNOSIS — D6869 Other thrombophilia: Secondary | ICD-10-CM | POA: Diagnosis not present

## 2019-10-27 DIAGNOSIS — Z809 Family history of malignant neoplasm, unspecified: Secondary | ICD-10-CM | POA: Insufficient documentation

## 2019-10-27 DIAGNOSIS — Z794 Long term (current) use of insulin: Secondary | ICD-10-CM | POA: Insufficient documentation

## 2019-10-27 DIAGNOSIS — Z8673 Personal history of transient ischemic attack (TIA), and cerebral infarction without residual deficits: Secondary | ICD-10-CM | POA: Insufficient documentation

## 2019-10-27 DIAGNOSIS — I5032 Chronic diastolic (congestive) heart failure: Secondary | ICD-10-CM | POA: Diagnosis not present

## 2019-10-27 DIAGNOSIS — I2510 Atherosclerotic heart disease of native coronary artery without angina pectoris: Secondary | ICD-10-CM | POA: Insufficient documentation

## 2019-10-27 DIAGNOSIS — R9431 Abnormal electrocardiogram [ECG] [EKG]: Secondary | ICD-10-CM | POA: Insufficient documentation

## 2019-10-27 DIAGNOSIS — Z7901 Long term (current) use of anticoagulants: Secondary | ICD-10-CM | POA: Diagnosis not present

## 2019-10-27 DIAGNOSIS — I130 Hypertensive heart and chronic kidney disease with heart failure and stage 1 through stage 4 chronic kidney disease, or unspecified chronic kidney disease: Secondary | ICD-10-CM | POA: Diagnosis not present

## 2019-10-27 DIAGNOSIS — Z9841 Cataract extraction status, right eye: Secondary | ICD-10-CM | POA: Insufficient documentation

## 2019-10-27 DIAGNOSIS — N183 Chronic kidney disease, stage 3 unspecified: Secondary | ICD-10-CM | POA: Insufficient documentation

## 2019-10-27 DIAGNOSIS — E1122 Type 2 diabetes mellitus with diabetic chronic kidney disease: Secondary | ICD-10-CM | POA: Insufficient documentation

## 2019-10-27 LAB — COMPREHENSIVE METABOLIC PANEL WITH GFR
ALT: 26 U/L (ref 0–44)
AST: 40 U/L (ref 15–41)
Albumin: 2.9 g/dL — ABNORMAL LOW (ref 3.5–5.0)
Alkaline Phosphatase: 98 U/L (ref 38–126)
Anion gap: 12 (ref 5–15)
BUN: 17 mg/dL (ref 8–23)
CO2: 22 mmol/L (ref 22–32)
Calcium: 8.6 mg/dL — ABNORMAL LOW (ref 8.9–10.3)
Chloride: 102 mmol/L (ref 98–111)
Creatinine, Ser: 1.22 mg/dL — ABNORMAL HIGH (ref 0.44–1.00)
GFR calc Af Amer: 47 mL/min — ABNORMAL LOW
GFR calc non Af Amer: 41 mL/min — ABNORMAL LOW
Glucose, Bld: 313 mg/dL — ABNORMAL HIGH (ref 70–99)
Potassium: 4.2 mmol/L (ref 3.5–5.1)
Sodium: 136 mmol/L (ref 135–145)
Total Bilirubin: 0.7 mg/dL (ref 0.3–1.2)
Total Protein: 6.1 g/dL — ABNORMAL LOW (ref 6.5–8.1)

## 2019-10-27 LAB — TSH: TSH: 1.098 u[IU]/mL (ref 0.350–4.500)

## 2019-10-27 MED ORDER — CARVEDILOL 25 MG PO TABS: 25.0000 mg | ORAL_TABLET | Freq: Two times a day (BID) | ORAL | 3 refills | Status: DC

## 2019-10-27 NOTE — Patient Instructions (Signed)
Increase coreg to 25mg  twice a day  Scheduling will be in touch with you to set up appointment in [facility] office in 2 weeks.

## 2019-10-27 NOTE — Addendum Note (Signed)
Encounter addended by: Juluis Mire, RN on: 10/27/2019 3:54 PM  Actions taken: Order list changed

## 2019-10-27 NOTE — Progress Notes (Signed)
Primary Care Physician: Caryl Bis, MD Primary Cardiologist: Dr Radford Pax (pending f/u in [place]) Primary Electrophysiologist: none Referring Physician: Dr Caroll Rancher is a 86 y.o. female with a history of DM type 2, chronic diastolic CHF, HTN, HLD, CVA (now on Plavix), cath 2008 with 30% prox LCx and 70% distal LCx, Takotsubo CM in 2012 and MPI with no ischemia, and persistent atrial fibrillation who presents for follow up in the [facility].  The patient was initially diagnosed with atrial fibrillation on 04/17/19 after presenting to the ER with a food impaction. Underwent urgent EGD. Patient was rate controlled on home dose Coreg and started on Eliquis. Repeat echo did shows mildly reduced EF 45-50%. She reports that a week prior to her admission she had generalized weakness and dyspnea with exertion. She denies significant snoring or alcohol use. She is on Eliquis for a CHADS2VASC score of 8. Patient is s/p DCCV on 06/08/19. She felt much better after her procedure with SOB improved and her family notes that she was able to walk more and her appetite improved. Unfortunately, she was back in rate controlled atrial flutter at follow up. She was loaded on amiodarone with plans for repeat DCCV.  Patient lost to follow up since 06/2019. Patient's granddaughter reports that the patient had nausea with amiodarone and stopped taking the medication in 07/2019. Patient does not some heart racing at times but otherwise feels well. She is not very active. She remains in atrial flutter today.   Today, she denies symptoms of chest pain, orthopnea, PND, lower extremity edema, dizziness, presyncope, syncope, snoring, daytime somnolence, bleeding, or neurologic sequela. The patient is tolerating medications without difficulties and is otherwise without complaint today.    Atrial Fibrillation Risk Factors:  she does not have symptoms or diagnosis of sleep apnea. she does  not have a history of rheumatic fever. she does not have a history of alcohol use. The patient does not have a history of early familial atrial fibrillation or other arrhythmias.  she has a BMI of Body mass index is 27.98 kg/m.Marland Kitchen Filed Weights   10/27/19 1115  Weight: 69.4 kg    Family History  Problem Relation Age of Onset  . Heart attack Father 46       MI  . Cancer Father        leukemia  . Cancer Brother   . Heart disease Daughter      Atrial Fibrillation Management history:  Previous antiarrhythmic drugs: amiodarone Previous cardioversions: 06/08/19 Previous ablations: none CHADS2VASC score: 8 Anticoagulation history: Eliquis   Past Medical History:  Diagnosis Date  . Anemia of other chronic disease   . CAD (coronary artery disease)    a. cath 2008 - 30% prox Cx, 70% distal Cx treated medically.  . Chronic diastolic CHF (congestive heart failure) ([place])   . Chronic pain syndrome   . CKD (chronic kidney disease), stage III   . History of blood transfusion 1960's   "when my last child was born" (12/23/2012)  . Hypoxemia   . Other chronic pulmonary heart diseases   . Pneumonia    "when I was little" (12/23/2012)  . Stroke [facility]) ~ 2011   "slowed my walking" (12/23/2012)  . Takotsubo cardiomyopathy    a. 2012 - dx unclear. Cardiolite OK but no cath at the time due to renal insufficiency. EF was 20-25%, but normalized in 2018.  . Type II diabetes mellitus ([place])   .  Unspecified pleural effusion    Past Surgical History:  Procedure Laterality Date  . ABDOMINAL HYSTERECTOMY    . APPENDECTOMY    . CARDIOVERSION N/A 06/08/2019   Procedure: CARDIOVERSION;  Surgeon: Donato Heinz, MD;  Location: [facility] ENDOSCOPY;  Service: Endoscopy;  Laterality: N/A;  . CATARACT EXTRACTION, BILATERAL    . CHOLECYSTECTOMY    . DILATION AND CURETTAGE OF UTERUS    . ESOPHAGOGASTRODUODENOSCOPY (EGD) WITH PROPOFOL N/A 04/17/2019   Procedure: ESOPHAGOGASTRODUODENOSCOPY (EGD) WITH  PROPOFOL;  Surgeon: Otis Brace, MD;  Location: [place];  Service: Gastroenterology;  Laterality: N/A;  . FOREIGN BODY REMOVAL  04/17/2019   Procedure: FOREIGN BODY REMOVAL;  Surgeon: Otis Brace, MD;  Location: [facility] ENDOSCOPY;  Service: Gastroenterology;;  . PATELLA RECONSTRUCTION Bilateral    "had my knee caps replaced" (12/23/2012)  . TONSILLECTOMY      Current Outpatient Medications  Medication Sig Dispense Refill  . acetaminophen (TYLENOL) 500 MG tablet Take 500 mg by mouth every 6 (six) hours as needed for moderate pain or headache.    Marland Kitchen apixaban (ELIQUIS) 5 MG TABS tablet Take 1 tablet (5 mg total) by mouth 2 (two) times daily. 60 tablet 0  . buPROPion (WELLBUTRIN XL) 300 MG 24 hr tablet     . carvedilol (COREG) 25 MG tablet Take 1 tablet (25 mg total) by mouth 2 (two) times daily with a meal. 60 tablet 3  . Cranberry 250 MG CAPS Take 250 mg by mouth daily.    . DULoxetine (CYMBALTA) 60 MG capsule Take 60 mg by mouth 2 (two) times daily.     . furosemide (LASIX) 40 MG tablet Take 40 mg by mouth 2 (two) times daily.    Marland Kitchen glipiZIDE (GLUCOTROL XL) 2.5 MG 24 hr tablet Take 2.5 mg by mouth daily with breakfast.    . insulin detemir (LEVEMIR) 100 UNIT/ML injection Inject 0.1 mLs (10 Units total) into the skin at bedtime. (Patient taking differently: Inject 8-22 Units into the skin at bedtime. ) 10 mL 11  . oxyCODONE-acetaminophen (PERCOCET) 10-325 MG tablet Take 1 tablet 4 (four) times daily by mouth. (Patient taking differently: Take 1 tablet by mouth 4 (four) times daily as needed for pain. ) 12 tablet 0  . pantoprazole (PROTONIX) 40 MG tablet Take 40 mg by mouth daily.    Marland Kitchen saccharomyces boulardii (FLORASTOR) 250 MG capsule Take 1 capsule (250 mg total) 2 (two) times daily by mouth. (Patient taking differently: Take 250 mg by mouth daily. ) 21 capsule 0  . simvastatin (ZOCOR) 20 MG tablet Take 20 mg by mouth daily.      No current facility-administered medications for this  encounter.    No Known Allergies  Social History   Socioeconomic History  . Marital status: Married    Spouse name: Not on file  . Number of children: Not on file  . Years of education: Not on file  . Highest education level: Not on file  Occupational History  . Occupation: RETIRED  Tobacco Use  . Smoking status: Never Smoker  . Smokeless tobacco: Never Used  Substance and Sexual Activity  . Alcohol use: No  . Drug use: No  . Sexual activity: Never  Other Topics Concern  . Not on file  Social History Narrative  . Not on file   Social Determinants of Health   Financial Resource Strain:   . Difficulty of Paying Living Expenses: Not on file  Food Insecurity:   . Worried About [employer] of  Food in the Last Year: Not on file  . Ran Out of Food in the Last Year: Not on file  Transportation Needs:   . Lack of Transportation (Medical): Not on file  . Lack of Transportation (Non-Medical): Not on file  Physical Activity:   . Days of Exercise per Week: Not on file  . Minutes of Exercise per Session: Not on file  Stress:   . Feeling of Stress : Not on file  Social Connections:   . Frequency of Communication with Friends and Family: Not on file  . Frequency of Social Gatherings with Friends and Family: Not on file  . Attends Religious Services: Not on file  . Active Member of Clubs or Organizations: Not on file  . Attends Archivist Meetings: Not on file  . Marital Status: Not on file  Intimate Partner Violence:   . Fear of Current or Ex-Partner: Not on file  . Emotionally Abused: Not on file  . Physically Abused: Not on file  . Sexually Abused: Not on file     ROS- All systems are reviewed and negative except as per the HPI above.  Physical Exam: Vitals:   10/27/19 1115  BP: (!) 132/92  Pulse: (!) 110  Weight: 69.4 kg  Height: 5\' 2"  (1.575 m)    GEN- The patient is well appearing elderly female, alert and oriented x 3 today.   HEENT-head  normocephalic, atraumatic, sclera clear, conjunctiva pink, hearing intact, trachea midline. Lungs- Clear to ausculation bilaterally, normal work of breathing Heart- irregular rate and rhythm, tachycardia, no murmurs, rubs or gallops  GI- soft, NT, ND, + BS Extremities- no clubbing, cyanosis. Trace edema R>L MS- no significant deformity or atrophy Skin- no rash or lesion Psych- euthymic mood, full affect Neuro- strength and sensation are intact   Wt Readings from Last 3 Encounters:  10/27/19 69.4 kg  06/29/19 65.8 kg  06/16/19 72 kg    EKG today demonstrates atrial flutter HR 110, QRS 104, QTc 498  Echo 04/18/19 demonstrated   1. The left ventricle has mildly reduced systolic function, with an ejection fraction of 45-50%. The cavity size was normal. There is mildly increased left ventricular wall thickness. Left ventricular diastolic Doppler parameters are indeterminate.  Hypokinesis of the mid anteroseptal and mid inferoseptal wall segments, unusual pattern.  2. The right ventricle has mildly reduced systolic function. The cavity was mildly enlarged. There is no increase in right ventricular wall thickness.  3. Left atrial size was moderately dilated.  4. Right atrial size was mildly dilated.  5. There is mild mitral annular calcification present. No evidence of mitral valve stenosis. Mild mitral regurgitation.  6. Tricuspid valve regurgitation is mild-moderate.  7. The aortic valve is tricuspid. Mild calcification of the aortic valve. No stenosis of the aortic valve.  8. The aorta is normal in size and structure.  9. The inferior vena cava was normal in size with <50% respiratory variability. PA systolic pressure 31 mmHg. 10. The patient was in atrial fibrillation.  Epic records are reviewed at length today  Assessment and Plan:  1. Persistent atrial fibrillation/atypical atrial flutter S/p DCCV on 06/08/19 with ERAF. Patient loaded on amiodarone but could not tolerate 2/2 side  effects. We had a long discussion today about therapeutic options. Would avoid class 1C and Multaq with h/o reduced systolic dysfunction. ? Compliance with dofetilide. She also requires frequent antibiotics for recurring UTIs. We also discussed repeat DCCV. At this point, patient and family prefer a  conservative approach, will plan for rate control. Increase Coreg to 25 mg BID. Continue Eliquis 5 mg BID.   This patients CHA2DS2-VASc Score and unadjusted Ischemic Stroke Rate (% per year) is equal to 10.8 % stroke rate/year from a score of 8  Above score calculated as 1 point each if present [CHF, HTN, DM, Vascular=MI/PAD/Aortic Plaque, Age if 65-74, or Female] Above score calculated as 2 points each if present [Age > 75, or Stroke/TIA/TE]  2. CAD LHC 2008 30% prox Cx, 70% distal Cx, no PCI at that time. No anginal symptoms.  3. HTN Stable, med changes as above.  4. Chronic diastolic CHF/mildly reduced EF EF 45-50% on last echo. No signs or symptoms of fluid overload.   Patient and family prefer to follow up with cardiology closer to where they live. Will refer to [facility] office. AF clinic as needed.    [facility] [address] [place], [place] [address] [phone] 10/27/2019 12:54 PM

## 2019-10-31 DIAGNOSIS — N390 Urinary tract infection, site not specified: Secondary | ICD-10-CM | POA: Diagnosis not present

## 2019-10-31 DIAGNOSIS — I350 Nonrheumatic aortic (valve) stenosis: Secondary | ICD-10-CM | POA: Diagnosis not present

## 2019-10-31 DIAGNOSIS — F0390 Unspecified dementia without behavioral disturbance: Secondary | ICD-10-CM | POA: Diagnosis not present

## 2019-10-31 DIAGNOSIS — I70213 Atherosclerosis of native arteries of extremities with intermittent claudication, bilateral legs: Secondary | ICD-10-CM | POA: Diagnosis not present

## 2019-10-31 DIAGNOSIS — K739 Chronic hepatitis, unspecified: Secondary | ICD-10-CM | POA: Diagnosis not present

## 2019-10-31 DIAGNOSIS — E46 Unspecified protein-calorie malnutrition: Secondary | ICD-10-CM | POA: Diagnosis not present

## 2019-10-31 DIAGNOSIS — E1151 Type 2 diabetes mellitus with diabetic peripheral angiopathy without gangrene: Secondary | ICD-10-CM | POA: Diagnosis not present

## 2019-10-31 DIAGNOSIS — Z794 Long term (current) use of insulin: Secondary | ICD-10-CM | POA: Diagnosis not present

## 2019-10-31 DIAGNOSIS — S81802A Unspecified open wound, left lower leg, initial encounter: Secondary | ICD-10-CM | POA: Diagnosis not present

## 2019-10-31 DIAGNOSIS — J449 Chronic obstructive pulmonary disease, unspecified: Secondary | ICD-10-CM | POA: Diagnosis not present

## 2019-10-31 DIAGNOSIS — M8530 Osteitis condensans, unspecified site: Secondary | ICD-10-CM | POA: Diagnosis not present

## 2019-10-31 DIAGNOSIS — E871 Hypo-osmolality and hyponatremia: Secondary | ICD-10-CM | POA: Diagnosis not present

## 2019-10-31 DIAGNOSIS — Z6828 Body mass index (BMI) 28.0-28.9, adult: Secondary | ICD-10-CM | POA: Diagnosis not present

## 2019-10-31 DIAGNOSIS — I69351 Hemiplegia and hemiparesis following cerebral infarction affecting right dominant side: Secondary | ICD-10-CM | POA: Diagnosis not present

## 2019-10-31 DIAGNOSIS — E1165 Type 2 diabetes mellitus with hyperglycemia: Secondary | ICD-10-CM | POA: Diagnosis not present

## 2019-10-31 DIAGNOSIS — E1122 Type 2 diabetes mellitus with diabetic chronic kidney disease: Secondary | ICD-10-CM | POA: Diagnosis not present

## 2019-10-31 DIAGNOSIS — Z515 Encounter for palliative care: Secondary | ICD-10-CM | POA: Diagnosis not present

## 2019-10-31 DIAGNOSIS — E119 Type 2 diabetes mellitus without complications: Secondary | ICD-10-CM | POA: Diagnosis not present

## 2019-10-31 DIAGNOSIS — F1120 Opioid dependence, uncomplicated: Secondary | ICD-10-CM | POA: Diagnosis not present

## 2019-10-31 DIAGNOSIS — Z7901 Long term (current) use of anticoagulants: Secondary | ICD-10-CM | POA: Diagnosis not present

## 2019-10-31 DIAGNOSIS — N184 Chronic kidney disease, stage 4 (severe): Secondary | ICD-10-CM | POA: Diagnosis not present

## 2019-10-31 DIAGNOSIS — Z96653 Presence of artificial knee joint, bilateral: Secondary | ICD-10-CM | POA: Diagnosis not present

## 2019-10-31 DIAGNOSIS — I480 Paroxysmal atrial fibrillation: Secondary | ICD-10-CM | POA: Diagnosis not present

## 2019-10-31 DIAGNOSIS — D692 Other nonthrombocytopenic purpura: Secondary | ICD-10-CM | POA: Diagnosis not present

## 2019-11-01 DIAGNOSIS — Z794 Long term (current) use of insulin: Secondary | ICD-10-CM | POA: Diagnosis not present

## 2019-11-01 DIAGNOSIS — M8530 Osteitis condensans, unspecified site: Secondary | ICD-10-CM | POA: Diagnosis not present

## 2019-11-01 DIAGNOSIS — Z96653 Presence of artificial knee joint, bilateral: Secondary | ICD-10-CM | POA: Diagnosis not present

## 2019-11-01 DIAGNOSIS — E119 Type 2 diabetes mellitus without complications: Secondary | ICD-10-CM | POA: Diagnosis not present

## 2019-11-01 DIAGNOSIS — Z7901 Long term (current) use of anticoagulants: Secondary | ICD-10-CM | POA: Diagnosis not present

## 2019-11-01 DIAGNOSIS — E871 Hypo-osmolality and hyponatremia: Secondary | ICD-10-CM | POA: Diagnosis not present

## 2019-11-01 DIAGNOSIS — I350 Nonrheumatic aortic (valve) stenosis: Secondary | ICD-10-CM | POA: Diagnosis not present

## 2019-11-01 DIAGNOSIS — N390 Urinary tract infection, site not specified: Secondary | ICD-10-CM | POA: Diagnosis not present

## 2019-11-08 DIAGNOSIS — E119 Type 2 diabetes mellitus without complications: Secondary | ICD-10-CM | POA: Diagnosis not present

## 2019-11-08 DIAGNOSIS — Z794 Long term (current) use of insulin: Secondary | ICD-10-CM | POA: Diagnosis not present

## 2019-11-08 DIAGNOSIS — Z7901 Long term (current) use of anticoagulants: Secondary | ICD-10-CM | POA: Diagnosis not present

## 2019-11-08 DIAGNOSIS — I350 Nonrheumatic aortic (valve) stenosis: Secondary | ICD-10-CM | POA: Diagnosis not present

## 2019-11-08 DIAGNOSIS — E871 Hypo-osmolality and hyponatremia: Secondary | ICD-10-CM | POA: Diagnosis not present

## 2019-11-08 DIAGNOSIS — N390 Urinary tract infection, site not specified: Secondary | ICD-10-CM | POA: Diagnosis not present

## 2019-11-08 DIAGNOSIS — Z96653 Presence of artificial knee joint, bilateral: Secondary | ICD-10-CM | POA: Diagnosis not present

## 2019-11-08 DIAGNOSIS — M8530 Osteitis condensans, unspecified site: Secondary | ICD-10-CM | POA: Diagnosis not present

## 2019-11-10 ENCOUNTER — Ambulatory Visit: Payer: Medicare HMO | Admitting: Cardiology

## 2019-11-11 DIAGNOSIS — E7849 Other hyperlipidemia: Secondary | ICD-10-CM | POA: Diagnosis not present

## 2019-11-11 DIAGNOSIS — I10 Essential (primary) hypertension: Secondary | ICD-10-CM | POA: Diagnosis not present

## 2019-11-16 DIAGNOSIS — I350 Nonrheumatic aortic (valve) stenosis: Secondary | ICD-10-CM | POA: Diagnosis not present

## 2019-11-16 DIAGNOSIS — N390 Urinary tract infection, site not specified: Secondary | ICD-10-CM | POA: Diagnosis not present

## 2019-11-16 DIAGNOSIS — Z7901 Long term (current) use of anticoagulants: Secondary | ICD-10-CM | POA: Diagnosis not present

## 2019-11-16 DIAGNOSIS — Z96653 Presence of artificial knee joint, bilateral: Secondary | ICD-10-CM | POA: Diagnosis not present

## 2019-11-16 DIAGNOSIS — M8530 Osteitis condensans, unspecified site: Secondary | ICD-10-CM | POA: Diagnosis not present

## 2019-11-16 DIAGNOSIS — E119 Type 2 diabetes mellitus without complications: Secondary | ICD-10-CM | POA: Diagnosis not present

## 2019-11-16 DIAGNOSIS — Z794 Long term (current) use of insulin: Secondary | ICD-10-CM | POA: Diagnosis not present

## 2019-11-16 DIAGNOSIS — E871 Hypo-osmolality and hyponatremia: Secondary | ICD-10-CM | POA: Diagnosis not present

## 2019-11-17 DIAGNOSIS — Z794 Long term (current) use of insulin: Secondary | ICD-10-CM | POA: Diagnosis not present

## 2019-11-17 DIAGNOSIS — M8530 Osteitis condensans, unspecified site: Secondary | ICD-10-CM | POA: Diagnosis not present

## 2019-11-17 DIAGNOSIS — N390 Urinary tract infection, site not specified: Secondary | ICD-10-CM | POA: Diagnosis not present

## 2019-11-17 DIAGNOSIS — I350 Nonrheumatic aortic (valve) stenosis: Secondary | ICD-10-CM | POA: Diagnosis not present

## 2019-11-17 DIAGNOSIS — Z96653 Presence of artificial knee joint, bilateral: Secondary | ICD-10-CM | POA: Diagnosis not present

## 2019-11-17 DIAGNOSIS — E871 Hypo-osmolality and hyponatremia: Secondary | ICD-10-CM | POA: Diagnosis not present

## 2019-11-17 DIAGNOSIS — E119 Type 2 diabetes mellitus without complications: Secondary | ICD-10-CM | POA: Diagnosis not present

## 2019-11-17 DIAGNOSIS — Z7901 Long term (current) use of anticoagulants: Secondary | ICD-10-CM | POA: Diagnosis not present

## 2019-11-17 DIAGNOSIS — L03213 Periorbital cellulitis: Secondary | ICD-10-CM | POA: Diagnosis not present

## 2019-11-21 DIAGNOSIS — I350 Nonrheumatic aortic (valve) stenosis: Secondary | ICD-10-CM | POA: Diagnosis not present

## 2019-11-21 DIAGNOSIS — Z794 Long term (current) use of insulin: Secondary | ICD-10-CM | POA: Diagnosis not present

## 2019-11-21 DIAGNOSIS — E119 Type 2 diabetes mellitus without complications: Secondary | ICD-10-CM | POA: Diagnosis not present

## 2019-11-21 DIAGNOSIS — Z7901 Long term (current) use of anticoagulants: Secondary | ICD-10-CM | POA: Diagnosis not present

## 2019-11-21 DIAGNOSIS — Z96653 Presence of artificial knee joint, bilateral: Secondary | ICD-10-CM | POA: Diagnosis not present

## 2019-11-21 DIAGNOSIS — N390 Urinary tract infection, site not specified: Secondary | ICD-10-CM | POA: Diagnosis not present

## 2019-11-21 DIAGNOSIS — E871 Hypo-osmolality and hyponatremia: Secondary | ICD-10-CM | POA: Diagnosis not present

## 2019-11-21 DIAGNOSIS — M8530 Osteitis condensans, unspecified site: Secondary | ICD-10-CM | POA: Diagnosis not present

## 2019-11-23 DIAGNOSIS — J069 Acute upper respiratory infection, unspecified: Secondary | ICD-10-CM | POA: Diagnosis not present

## 2019-11-23 DIAGNOSIS — Z20828 Contact with and (suspected) exposure to other viral communicable diseases: Secondary | ICD-10-CM | POA: Diagnosis not present

## 2019-11-28 DIAGNOSIS — I69351 Hemiplegia and hemiparesis following cerebral infarction affecting right dominant side: Secondary | ICD-10-CM | POA: Diagnosis not present

## 2019-11-28 DIAGNOSIS — E1165 Type 2 diabetes mellitus with hyperglycemia: Secondary | ICD-10-CM | POA: Diagnosis not present

## 2019-11-28 DIAGNOSIS — Z794 Long term (current) use of insulin: Secondary | ICD-10-CM | POA: Diagnosis not present

## 2019-11-28 DIAGNOSIS — M5442 Lumbago with sciatica, left side: Secondary | ICD-10-CM | POA: Diagnosis not present

## 2019-11-28 DIAGNOSIS — Z6828 Body mass index (BMI) 28.0-28.9, adult: Secondary | ICD-10-CM | POA: Diagnosis not present

## 2019-11-28 DIAGNOSIS — Z7902 Long term (current) use of antithrombotics/antiplatelets: Secondary | ICD-10-CM | POA: Diagnosis not present

## 2019-11-28 DIAGNOSIS — E46 Unspecified protein-calorie malnutrition: Secondary | ICD-10-CM | POA: Diagnosis not present

## 2019-11-28 DIAGNOSIS — Z515 Encounter for palliative care: Secondary | ICD-10-CM | POA: Diagnosis not present

## 2019-11-28 DIAGNOSIS — D692 Other nonthrombocytopenic purpura: Secondary | ICD-10-CM | POA: Diagnosis not present

## 2019-11-29 ENCOUNTER — Inpatient Hospital Stay (HOSPITAL_COMMUNITY)
Admission: EM | Admit: 2019-11-29 | Discharge: 2019-12-03 | DRG: 871 | Disposition: A | Discharge status: Skilled Nursing Facility | Payer: Medicare HMO | Attending: Internal Medicine | Admitting: Internal Medicine

## 2019-11-29 ENCOUNTER — Inpatient Hospital Stay (HOSPITAL_COMMUNITY): Payer: Medicare HMO

## 2019-11-29 ENCOUNTER — Emergency Department (HOSPITAL_COMMUNITY): Payer: Medicare HMO

## 2019-11-29 ENCOUNTER — Encounter (HOSPITAL_COMMUNITY): Payer: Self-pay | Admitting: Emergency Medicine

## 2019-11-29 ENCOUNTER — Other Ambulatory Visit: Payer: Self-pay

## 2019-11-29 DIAGNOSIS — E876 Hypokalemia: Secondary | ICD-10-CM | POA: Diagnosis not present

## 2019-11-29 DIAGNOSIS — E11649 Type 2 diabetes mellitus with hypoglycemia without coma: Secondary | ICD-10-CM | POA: Diagnosis not present

## 2019-11-29 DIAGNOSIS — R600 Localized edema: Secondary | ICD-10-CM

## 2019-11-29 DIAGNOSIS — A419 Sepsis, unspecified organism: Principal | ICD-10-CM | POA: Diagnosis present

## 2019-11-29 DIAGNOSIS — I130 Hypertensive heart and chronic kidney disease with heart failure and stage 1 through stage 4 chronic kidney disease, or unspecified chronic kidney disease: Secondary | ICD-10-CM | POA: Diagnosis not present

## 2019-11-29 DIAGNOSIS — I5042 Chronic combined systolic (congestive) and diastolic (congestive) heart failure: Secondary | ICD-10-CM | POA: Diagnosis not present

## 2019-11-29 DIAGNOSIS — J181 Lobar pneumonia, unspecified organism: Secondary | ICD-10-CM

## 2019-11-29 DIAGNOSIS — Z8673 Personal history of transient ischemic attack (TIA), and cerebral infarction without residual deficits: Secondary | ICD-10-CM | POA: Diagnosis not present

## 2019-11-29 DIAGNOSIS — Z515 Encounter for palliative care: Secondary | ICD-10-CM | POA: Diagnosis not present

## 2019-11-29 DIAGNOSIS — N1832 Chronic kidney disease, stage 3b: Secondary | ICD-10-CM | POA: Diagnosis not present

## 2019-11-29 DIAGNOSIS — R509 Fever, unspecified: Secondary | ICD-10-CM | POA: Diagnosis not present

## 2019-11-29 DIAGNOSIS — Z8249 Family history of ischemic heart disease and other diseases of the circulatory system: Secondary | ICD-10-CM

## 2019-11-29 DIAGNOSIS — I10 Essential (primary) hypertension: Secondary | ICD-10-CM | POA: Diagnosis not present

## 2019-11-29 DIAGNOSIS — G894 Chronic pain syndrome: Secondary | ICD-10-CM | POA: Diagnosis present

## 2019-11-29 DIAGNOSIS — Z66 Do not resuscitate: Secondary | ICD-10-CM | POA: Diagnosis not present

## 2019-11-29 DIAGNOSIS — N390 Urinary tract infection, site not specified: Secondary | ICD-10-CM | POA: Diagnosis present

## 2019-11-29 DIAGNOSIS — Z23 Encounter for immunization: Secondary | ICD-10-CM | POA: Diagnosis not present

## 2019-11-29 DIAGNOSIS — J189 Pneumonia, unspecified organism: Secondary | ICD-10-CM

## 2019-11-29 DIAGNOSIS — Z7901 Long term (current) use of anticoagulants: Secondary | ICD-10-CM

## 2019-11-29 DIAGNOSIS — D649 Anemia, unspecified: Secondary | ICD-10-CM | POA: Diagnosis not present

## 2019-11-29 DIAGNOSIS — I4811 Longstanding persistent atrial fibrillation: Secondary | ICD-10-CM | POA: Diagnosis not present

## 2019-11-29 DIAGNOSIS — Z20822 Contact with and (suspected) exposure to covid-19: Secondary | ICD-10-CM | POA: Diagnosis not present

## 2019-11-29 DIAGNOSIS — R4182 Altered mental status, unspecified: Secondary | ICD-10-CM

## 2019-11-29 DIAGNOSIS — G9341 Metabolic encephalopathy: Secondary | ICD-10-CM | POA: Diagnosis not present

## 2019-11-29 DIAGNOSIS — I4819 Other persistent atrial fibrillation: Secondary | ICD-10-CM | POA: Diagnosis present

## 2019-11-29 DIAGNOSIS — I272 Pulmonary hypertension, unspecified: Secondary | ICD-10-CM | POA: Diagnosis present

## 2019-11-29 DIAGNOSIS — Z794 Long term (current) use of insulin: Secondary | ICD-10-CM | POA: Diagnosis not present

## 2019-11-29 DIAGNOSIS — E785 Hyperlipidemia, unspecified: Secondary | ICD-10-CM | POA: Diagnosis present

## 2019-11-29 DIAGNOSIS — Z79891 Long term (current) use of opiate analgesic: Secondary | ICD-10-CM

## 2019-11-29 DIAGNOSIS — I2510 Atherosclerotic heart disease of native coronary artery without angina pectoris: Secondary | ICD-10-CM | POA: Diagnosis present

## 2019-11-29 DIAGNOSIS — E1165 Type 2 diabetes mellitus with hyperglycemia: Secondary | ICD-10-CM

## 2019-11-29 DIAGNOSIS — G9340 Encephalopathy, unspecified: Secondary | ICD-10-CM | POA: Diagnosis not present

## 2019-11-29 DIAGNOSIS — F329 Major depressive disorder, single episode, unspecified: Secondary | ICD-10-CM | POA: Diagnosis present

## 2019-11-29 DIAGNOSIS — R140 Abdominal distension (gaseous): Secondary | ICD-10-CM | POA: Diagnosis not present

## 2019-11-29 DIAGNOSIS — M7989 Other specified soft tissue disorders: Secondary | ICD-10-CM | POA: Diagnosis not present

## 2019-11-29 DIAGNOSIS — N183 Chronic kidney disease, stage 3 unspecified: Secondary | ICD-10-CM | POA: Diagnosis present

## 2019-11-29 DIAGNOSIS — R109 Unspecified abdominal pain: Secondary | ICD-10-CM | POA: Diagnosis not present

## 2019-11-29 DIAGNOSIS — E782 Mixed hyperlipidemia: Secondary | ICD-10-CM | POA: Diagnosis not present

## 2019-11-29 DIAGNOSIS — Z7189 Other specified counseling: Secondary | ICD-10-CM

## 2019-11-29 DIAGNOSIS — R05 Cough: Secondary | ICD-10-CM | POA: Diagnosis not present

## 2019-11-29 DIAGNOSIS — E1122 Type 2 diabetes mellitus with diabetic chronic kidney disease: Secondary | ICD-10-CM | POA: Diagnosis present

## 2019-11-29 DIAGNOSIS — R000 Tachycardia, unspecified: Secondary | ICD-10-CM | POA: Diagnosis not present

## 2019-11-29 DIAGNOSIS — K2100 Gastro-esophageal reflux disease with esophagitis, without bleeding: Secondary | ICD-10-CM | POA: Diagnosis not present

## 2019-11-29 DIAGNOSIS — R5383 Other fatigue: Secondary | ICD-10-CM | POA: Diagnosis not present

## 2019-11-29 HISTORY — DX: Unspecified atrial fibrillation: I48.91

## 2019-11-29 LAB — COMPREHENSIVE METABOLIC PANEL
ALT: 18 U/L (ref 0–44)
AST: 11 U/L — ABNORMAL LOW (ref 15–41)
Albumin: 3 g/dL — ABNORMAL LOW (ref 3.5–5.0)
Alkaline Phosphatase: 117 U/L (ref 38–126)
Anion gap: 11 (ref 5–15)
BUN: 16 mg/dL (ref 8–23)
CO2: 24 mmol/L (ref 22–32)
Calcium: 8.6 mg/dL — ABNORMAL LOW (ref 8.9–10.3)
Chloride: 102 mmol/L (ref 98–111)
Creatinine, Ser: 1.06 mg/dL — ABNORMAL HIGH (ref 0.44–1.00)
GFR calc Af Amer: 56 mL/min — ABNORMAL LOW (ref >60)
GFR calc non Af Amer: 49 mL/min — ABNORMAL LOW (ref >60)
Glucose, Bld: 300 mg/dL — ABNORMAL HIGH (ref 70–99)
Potassium: 3.6 mmol/L (ref 3.5–5.1)
Sodium: 137 mmol/L (ref 135–145)
Total Bilirubin: 1.5 mg/dL — ABNORMAL HIGH (ref 0.3–1.2)
Total Protein: 6.6 g/dL (ref 6.5–8.1)

## 2019-11-29 LAB — URINALYSIS, ROUTINE W REFLEX MICROSCOPIC
Bacteria, UA: NONE SEEN
Bilirubin Urine: NEGATIVE
Glucose, UA: >=500 mg/dL — AB
Ketones, ur: 20 mg/dL — AB
Nitrite: NEGATIVE
Protein, ur: 100 mg/dL — AB
Specific Gravity, Urine: 1.02 (ref 1.005–1.030)
pH: 5 (ref 5.0–8.0)

## 2019-11-29 LAB — GLUCOSE, CAPILLARY
Glucose-Capillary: 205 mg/dL — ABNORMAL HIGH (ref 70–99)
Glucose-Capillary: 147 mg/dL — ABNORMAL HIGH (ref 70–99)

## 2019-11-29 LAB — CBC WITH DIFFERENTIAL/PLATELET
Abs Immature Granulocytes: 0.27 10*3/uL — ABNORMAL HIGH (ref 0.00–0.07)
Basophils Absolute: 0.1 10*3/uL (ref 0.0–0.1)
Basophils Relative: 0 %
Eosinophils Absolute: 0 10*3/uL (ref 0.0–0.5)
Eosinophils Relative: 0 %
HCT: 37.9 % (ref 36.0–46.0)
Hemoglobin: 11.6 g/dL — ABNORMAL LOW (ref 12.0–15.0)
Immature Granulocytes: 1 %
Lymphocytes Relative: 7 %
Lymphs Abs: 1.7 10*3/uL (ref 0.7–4.0)
MCH: 28.8 pg (ref 26.0–34.0)
MCHC: 30.6 g/dL (ref 30.0–36.0)
MCV: 94 fL (ref 80.0–100.0)
Monocytes Absolute: 1.1 10*3/uL — ABNORMAL HIGH (ref 0.1–1.0)
Monocytes Relative: 5 %
Neutro Abs: 21 10*3/uL — ABNORMAL HIGH (ref 1.7–7.7)
Neutrophils Relative %: 87 %
Platelets: 207 10*3/uL (ref 150–400)
RBC: 4.03 MIL/uL (ref 3.87–5.11)
RDW: 14.4 % (ref 11.5–15.5)
WBC: 24.1 10*3/uL — ABNORMAL HIGH (ref 4.0–10.5)
nRBC: 0 % (ref 0.0–0.2)

## 2019-11-29 LAB — MAGNESIUM: Magnesium: 1.8 mg/dL (ref 1.7–2.4)

## 2019-11-29 LAB — LACTIC ACID, PLASMA
Lactic Acid, Venous: 1.5 mmol/L (ref 0.5–1.9)
Lactic Acid, Venous: 1.3 mmol/L (ref 0.5–1.9)

## 2019-11-29 LAB — CBG MONITORING, ED: Glucose-Capillary: 273 mg/dL — ABNORMAL HIGH (ref 70–99)

## 2019-11-29 LAB — BRAIN NATRIURETIC PEPTIDE: B Natriuretic Peptide: 452 pg/mL — ABNORMAL HIGH (ref 0.0–100.0)

## 2019-11-29 LAB — HEMOGLOBIN A1C
Hgb A1c MFr Bld: 9.4 % — ABNORMAL HIGH (ref 4.8–5.6)
Mean Plasma Glucose: 223.08 mg/dL

## 2019-11-29 LAB — PROTIME-INR
INR: 1.6 — ABNORMAL HIGH (ref 0.8–1.2)
Prothrombin Time: 18.6 seconds — ABNORMAL HIGH (ref 11.4–15.2)

## 2019-11-29 LAB — PROCALCITONIN: Procalcitonin: 0.16 ng/mL

## 2019-11-29 LAB — SARS CORONAVIRUS 2 (TAT 6-24 HRS): SARS Coronavirus 2: NEGATIVE

## 2019-11-29 LAB — POC SARS CORONAVIRUS 2 AG -  ED: SARS Coronavirus 2 Ag: NEGATIVE

## 2019-11-29 LAB — APTT: aPTT: 44 seconds — ABNORMAL HIGH (ref 24–36)

## 2019-11-29 MED ORDER — TETANUS-DIPHTH-ACELL PERTUSSIS 5-2.5-18.5 LF-MCG/0.5 IM SUSP
0.5000 mL | Freq: Once | INTRAMUSCULAR | Status: AC
  Administered 2019-11-29: 0.5 mL via INTRAMUSCULAR
  Filled 2019-11-29: qty 0.5

## 2019-11-29 MED ORDER — IOHEXOL 9 MG/ML PO SOLN
ORAL | Status: AC
  Filled 2019-11-29: qty 1000

## 2019-11-29 MED ORDER — OXYCODONE-ACETAMINOPHEN 5-325 MG PO TABS
1.0000 | ORAL_TABLET | Freq: Four times a day (QID) | ORAL | Status: DC | PRN
  Administered 2019-11-29 – 2019-12-02 (×7): 1 via ORAL
  Filled 2019-11-29 (×9): qty 1

## 2019-11-29 MED ORDER — INSULIN DETEMIR 100 UNIT/ML ~~LOC~~ SOLN
10.0000 [IU] | Freq: Every day | SUBCUTANEOUS | Status: DC
  Administered 2019-11-29: 10 [IU] via SUBCUTANEOUS
  Filled 2019-11-29: qty 0.1

## 2019-11-29 MED ORDER — CARVEDILOL 12.5 MG PO TABS
25.0000 mg | ORAL_TABLET | Freq: Two times a day (BID) | ORAL | Status: DC
  Administered 2019-11-30 – 2019-12-03 (×7): 25 mg via ORAL
  Filled 2019-11-29 (×7): qty 2

## 2019-11-29 MED ORDER — SODIUM CHLORIDE 0.9 % IV SOLN
2.0000 g | Freq: Once | INTRAVENOUS | Status: AC
  Administered 2019-11-29: 2 g via INTRAVENOUS
  Filled 2019-11-29: qty 2

## 2019-11-29 MED ORDER — ONDANSETRON HCL 4 MG/2ML IJ SOLN: 4.0000 mg | Freq: Four times a day (QID) | INTRAMUSCULAR | Status: DC | PRN

## 2019-11-29 MED ORDER — DULOXETINE HCL 60 MG PO CPEP
60.0000 mg | ORAL_CAPSULE | Freq: Two times a day (BID) | ORAL | Status: DC
  Administered 2019-11-29 – 2019-12-03 (×8): 60 mg via ORAL
  Filled 2019-11-29 (×8): qty 1

## 2019-11-29 MED ORDER — DM-GUAIFENESIN ER 30-600 MG PO TB12
1.0000 | ORAL_TABLET | Freq: Two times a day (BID) | ORAL | Status: DC | PRN
  Administered 2019-11-30 – 2019-12-01 (×2): 1 via ORAL
  Filled 2019-11-29 (×2): qty 1

## 2019-11-29 MED ORDER — POTASSIUM CHLORIDE IN NACL 20-0.9 MEQ/L-% IV SOLN: INTRAVENOUS | Status: DC

## 2019-11-29 MED ORDER — AZITHROMYCIN 250 MG PO TABS
500.0000 mg | ORAL_TABLET | Freq: Every day | ORAL | Status: DC
  Administered 2019-11-30 – 2019-12-03 (×4): 500 mg via ORAL
  Filled 2019-11-29 (×4): qty 2

## 2019-11-29 MED ORDER — IOHEXOL 9 MG/ML PO SOLN: 500.0000 mL | ORAL | Status: AC

## 2019-11-29 MED ORDER — ONDANSETRON HCL 4 MG PO TABS: 4.0000 mg | ORAL_TABLET | Freq: Four times a day (QID) | ORAL | Status: DC | PRN

## 2019-11-29 MED ORDER — SODIUM CHLORIDE 0.9 % IV SOLN
500.0000 mg | Freq: Once | INTRAVENOUS | Status: AC
  Administered 2019-11-29: 500 mg via INTRAVENOUS
  Filled 2019-11-29: qty 500

## 2019-11-29 MED ORDER — SIMVASTATIN 20 MG PO TABS
20.0000 mg | ORAL_TABLET | Freq: Every day | ORAL | Status: DC
  Administered 2019-11-30 – 2019-12-03 (×4): 20 mg via ORAL
  Filled 2019-11-29 (×4): qty 1

## 2019-11-29 MED ORDER — APIXABAN 5 MG PO TABS
5.0000 mg | ORAL_TABLET | Freq: Two times a day (BID) | ORAL | Status: DC
  Administered 2019-11-29 – 2019-12-03 (×8): 5 mg via ORAL
  Filled 2019-11-29 (×8): qty 1

## 2019-11-29 MED ORDER — INSULIN ASPART 100 UNIT/ML ~~LOC~~ SOLN: 0.0000 [IU] | Freq: Every day | SUBCUTANEOUS | Status: DC

## 2019-11-29 MED ORDER — ACETAMINOPHEN 325 MG PO TABS
650.0000 mg | ORAL_TABLET | Freq: Four times a day (QID) | ORAL | Status: DC | PRN
  Administered 2019-12-02 (×2): 650 mg via ORAL
  Filled 2019-11-29 (×2): qty 2

## 2019-11-29 MED ORDER — SODIUM CHLORIDE 0.9 % IV SOLN
2.0000 g | Freq: Two times a day (BID) | INTRAVENOUS | Status: DC
  Administered 2019-11-30 – 2019-12-03 (×6): 2 g via INTRAVENOUS
  Filled 2019-11-29 (×6): qty 2

## 2019-11-29 MED ORDER — ACETAMINOPHEN 325 MG PO TABS
650.0000 mg | ORAL_TABLET | Freq: Once | ORAL | Status: AC
  Administered 2019-11-29: 650 mg via ORAL
  Filled 2019-11-29: qty 2

## 2019-11-29 MED ORDER — ACETAMINOPHEN 650 MG RE SUPP: 650.0000 mg | Freq: Four times a day (QID) | RECTAL | Status: DC | PRN

## 2019-11-29 MED ORDER — INSULIN ASPART 100 UNIT/ML ~~LOC~~ SOLN
0.0000 [IU] | Freq: Three times a day (TID) | SUBCUTANEOUS | Status: DC
  Administered 2019-11-29: 3 [IU] via SUBCUTANEOUS
  Administered 2019-11-30: 1 [IU] via SUBCUTANEOUS
  Administered 2019-12-01: 3 [IU] via SUBCUTANEOUS
  Administered 2019-12-01: 2 [IU] via SUBCUTANEOUS
  Administered 2019-12-01: 1 [IU] via SUBCUTANEOUS
  Administered 2019-12-02: 3 [IU] via SUBCUTANEOUS
  Administered 2019-12-02 (×2): 2 [IU] via SUBCUTANEOUS
  Administered 2019-12-03: 1 [IU] via SUBCUTANEOUS
  Administered 2019-12-03: 2 [IU] via SUBCUTANEOUS

## 2019-11-29 MED ORDER — SACCHAROMYCES BOULARDII 250 MG PO CAPS
250.0000 mg | ORAL_CAPSULE | Freq: Every day | ORAL | Status: DC
  Administered 2019-11-30 – 2019-12-03 (×4): 250 mg via ORAL
  Filled 2019-11-29 (×6): qty 1

## 2019-11-29 NOTE — ED Triage Notes (Signed)
Family member reports pt has been talking out of her head for a couple of days. Pt has had a cough x 1 week.  Tested neg for covid last week.

## 2019-11-29 NOTE — ED Provider Notes (Signed)
[facility] EMERGENCY DEPARTMENT Provider Note   CSN: [id number] Arrival date & time: 11/29/19  1017     History Chief Complaint  Patient presents with  . Altered Mental Status    Norma [name] is a 86 y.o. female with a history of CAD, CHF last EF 45-50%, A. Fib on Eliquis, anemia, prior stroke, T2DM, CKD, pulmonary hypertension, & prior sepsis who presents to the emergency department with her grandson for evaluation of AMS over the past few days. Per patient's grandson she has seemed confused with odd behaviors/statesmsents- examples provided include patient discussing the basement of the home when there is no basement and her not taking her medicines and making statements that her husband who cares for her is trying to kill her when he encourages her to take her meds. She has seemed generally weak and has been coughing some. Family has not noted any N/V/D, chest pain, dyspnea, or fevers at home. She does have baseline LE swelling but this seems a bit worse to him than usual. She is non-ambulatory at baseline. He also mentions that the cat scratched her LLE last night, they applied bandaids- unknown last tetanus per grandson. They have not noted any falls. Patient states she is only complaining of cough that is productive of yellow phelgm sputum and feeling generally weak, no other complaints, denies pain, denies URI sxs, dyspnea, chest pain, abdominal pain, N/V/D, or dysuria.   HPI     Past Medical History:  Diagnosis Date  . Anemia of other chronic disease   . Atrial fibrillation ([place])   . CAD (coronary artery disease)    a. cath 2008 - 30% prox Cx, 70% distal Cx treated medically.  . Chronic diastolic CHF (congestive heart failure) ([place])   . Chronic pain syndrome   . CKD (chronic kidney disease), stage III   . History of blood transfusion 1960's   "when my last child was born" (12/23/2012)  . Hypoxemia   . Other chronic pulmonary heart diseases   . Pneumonia    "when I was  little" (12/23/2012)  . Stroke [facility]) ~ 2011   "slowed my walking" (12/23/2012)  . Takotsubo cardiomyopathy    a. 2012 - dx unclear. Cardiolite OK but no cath at the time due to renal insufficiency. EF was 20-25%, but normalized in 2018.  . Type II diabetes mellitus ([place])   . Unspecified pleural effusion     Patient Active Problem List   Diagnosis Date Noted  . Secondary hypercoagulable state ([place]) 10/27/2019  . Atypical atrial flutter ([place]) 06/16/2019  . Chronic diastolic heart failure ([place])   . Persistent atrial fibrillation ([place]) 04/18/2019  . Atrial fibrillation with RVR ([place]) 04/17/2019  . History of completed stroke 04/17/2019  . Food impaction of esophagus   . Complicated UTI (urinary tract infection) 07/29/2017  . Bacteremia due to Klebsiella pneumoniae 07/28/2017  . Klebsiella sepsis ([place]) 07/28/2017  . Acute pyelonephritis 07/27/2017  . Acute renal failure superimposed on stage 3 chronic kidney disease ([place]) 07/26/2017  . Uncontrolled type 2 diabetes mellitus with hyperglycemia, with long-term current use of insulin ([place]) 07/26/2017  . Acute metabolic encephalopathy 93/26/7124  . Sepsis ([place]) 06/18/2017  . Acute renal failure superimposed on stage 2 chronic kidney disease ([place]) 06/18/2017  . Abdominal pain   . Urinary tract infection without hematuria   . Lower urinary tract infectious disease 02/26/2016  . Weakness generalized 02/26/2016  . Weakness 02/26/2016  . DM2 (diabetes mellitus, type 2) ([place]) 02/26/2016  .  Hemoptysis 12/24/2012  . Dyspnea on exertion 03/08/2012  . Palpitations 02/05/2012  . Pulmonary nodule 02/05/2012  . Nausea and vomiting 01/03/2011  . Dilated cardiomyopathy ([place]) 12/04/2010  . Acute on chronic congestive heart failure ([place]) 12/04/2010  . Abnormal EKG 12/04/2010  . Pulmonary hypertension ([place]) 12/04/2010  . Hypotension 12/04/2010  . Chronic kidney disease (CKD), stage III (moderate) ([place]) 12/04/2010  . CAD (coronary artery disease)  12/04/2010    Past Surgical History:  Procedure Laterality Date  . ABDOMINAL HYSTERECTOMY    . APPENDECTOMY    . CARDIOVERSION N/A 06/08/2019   Procedure: CARDIOVERSION;  Surgeon: Donato Heinz, MD;  Location: [facility] ENDOSCOPY;  Service: Endoscopy;  Laterality: N/A;  . CATARACT EXTRACTION, BILATERAL    . CHOLECYSTECTOMY    . DILATION AND CURETTAGE OF UTERUS    . ESOPHAGOGASTRODUODENOSCOPY (EGD) WITH PROPOFOL N/A 04/17/2019   Procedure: ESOPHAGOGASTRODUODENOSCOPY (EGD) WITH PROPOFOL;  Surgeon: Otis Brace, MD;  Location: [place];  Service: Gastroenterology;  Laterality: N/A;  . FOREIGN BODY REMOVAL  04/17/2019   Procedure: FOREIGN BODY REMOVAL;  Surgeon: Otis Brace, MD;  Location: [facility] ENDOSCOPY;  Service: Gastroenterology;;  . PATELLA RECONSTRUCTION Bilateral    "had my knee caps replaced" (12/23/2012)  . TONSILLECTOMY       OB History   No obstetric history on file.     Family History  Problem Relation Age of Onset  . Heart attack Father 8       MI  . Cancer Father        leukemia  . Cancer Brother   . Heart disease Daughter     Social History   Tobacco Use  . Smoking status: Never Smoker  . Smokeless tobacco: Never Used  Substance Use Topics  . Alcohol use: No  . Drug use: No    Home Medications Prior to Admission medications   Medication Sig Start Date End Date Taking? Authorizing Provider  acetaminophen (TYLENOL) 500 MG tablet Take 500 mg by mouth every 6 (six) hours as needed for moderate pain or headache.    [provider]  apixaban (ELIQUIS) 5 MG TABS tablet Take 1 tablet (5 mg total) by mouth 2 (two) times daily. 06/08/19   Lyda Jester M, PA-C  buPROPion (WELLBUTRIN XL) 300 MG 24 hr tablet  06/07/19   [provider]  carvedilol (COREG) 25 MG tablet Take 1 tablet (25 mg total) by mouth 2 (two) times daily with a meal. 10/27/19   Fenton, Clint R, PA  Cranberry 250 MG CAPS Take 250 mg by mouth daily.    [provider]  DULoxetine (CYMBALTA) 60 MG capsule Take 60 mg by mouth 2 (two) times daily.     [provider]  furosemide (LASIX) 40 MG tablet Take 40 mg by mouth 2 (two) times daily.    [provider]  glipiZIDE (GLUCOTROL XL) 2.5 MG 24 hr tablet Take 2.5 mg by mouth daily with breakfast.    [provider]  insulin detemir (LEVEMIR) 100 UNIT/ML injection Inject 0.1 mLs (10 Units total) into the skin at bedtime. Patient taking differently: Inject 8-22 Units into the skin at bedtime.  04/21/19   Swayze, Ava, DO  oxyCODONE-acetaminophen (PERCOCET) 10-325 MG tablet Take 1 tablet 4 (four) times daily by mouth. Patient taking differently: Take 1 tablet by mouth 4 (four) times daily as needed for pain.  07/29/17   Debbe Odea, MD  pantoprazole (PROTONIX) 40 MG tablet Take 40 mg by mouth daily.  [provider]  saccharomyces boulardii (FLORASTOR) 250 MG capsule Take 1 capsule (250 mg total) 2 (two) times daily by mouth. Patient taking differently: Take 250 mg by mouth daily.  07/29/17   Debbe Odea, MD  simvastatin (ZOCOR) 20 MG tablet Take 20 mg by mouth daily.     [provider]    Allergies    Patient has no known allergies.  Review of Systems   Review of Systems  Constitutional: Negative for chills and fever.  Respiratory: Positive for cough. Negative for shortness of breath.   Cardiovascular: Positive for leg swelling. Negative for chest pain.  Gastrointestinal: Negative for abdominal pain, diarrhea, nausea and vomiting.  Genitourinary: Negative for dysuria.  Neurological: Positive for weakness (generalized).  Psychiatric/Behavioral: Positive for confusion.  All other systems reviewed and are negative.   Physical Exam Updated Vital Signs BP (!) 150/90 (BP Location: Left Arm)   Pulse (!) 104   Temp 99 F (37.2 C) (Oral)   Resp 17   Ht 5\' 2"  (1.575 m)   Wt 69.4 kg   SpO2 94%   BMI 27.98 kg/m   Physical Exam Vitals and  nursing note reviewed. Exam conducted with a chaperone present.  Constitutional:      Appearance: She is well-developed. She is ill-appearing. She is not toxic-appearing.  HENT:     Head: Normocephalic and atraumatic.  Eyes:     General:        Right eye: No discharge.        Left eye: No discharge.     Conjunctiva/sclera: Conjunctivae normal.     Pupils: Pupils are equal, round, and reactive to light.  Cardiovascular:     Rate and Rhythm: Tachycardia present.     Heart sounds: No murmur.  Pulmonary:     Effort: No respiratory distress.     Breath sounds: Wheezing (faint at the bases) and rhonchi (right base) present.  Abdominal:     General: There is no distension.     Palpations: Abdomen is soft.     Tenderness: There is abdominal tenderness (very mild, generalized). There is no guarding or rebound.  Genitourinary:    Comments: Peri-anal skin irritation.  Musculoskeletal:     Cervical back: Neck supple.     Comments: Symmetric 1+ pitting edema to the lower legs.  No focal bony tenderness or point/focal midline spinal tenderness  Skin:    General: Skin is warm and dry.     Findings: No rash.     Comments: Multiple scattered areas of bruising/ecchymosis to extremities in variable stages of healing.  LLE: skin tear to the plantar surface of the 5th toe as well as skin tear to the lateral distal 1/3rd of the lower leg. No active bleeding. No purulence or surrounding erythema.    Neurological:     Mental Status: She is alert.     Comments: Clear speech.   Psychiatric:        Behavior: Behavior normal.    ED Results / Procedures / Treatments   Labs (all labs ordered are listed, but only abnormal results are displayed) Labs Reviewed  COMPREHENSIVE METABOLIC PANEL - Abnormal; Notable for the following components:      Result Value   Glucose, Bld 300 (*)    Creatinine, Ser 1.06 (*)    Calcium 8.6 (*)    Albumin 3.0 (*)    AST 11 (*)    Total Bilirubin 1.5 (*)    GFR calc  non Af  Amer 49 (*)    GFR calc Af Amer 56 (*)    All other components within normal limits  CBC WITH DIFFERENTIAL/PLATELET - Abnormal; Notable for the following components:   WBC 24.1 (*)    Hemoglobin 11.6 (*)    Neutro Abs 21.0 (*)    Monocytes Absolute 1.1 (*)    Abs Immature Granulocytes 0.27 (*)    All other components within normal limits  APTT - Abnormal; Notable for the following components:   aPTT 44 (*)    All other components within normal limits  PROTIME-INR - Abnormal; Notable for the following components:   Prothrombin Time 18.6 (*)    INR 1.6 (*)    All other components within normal limits  URINALYSIS, ROUTINE W REFLEX MICROSCOPIC - Abnormal; Notable for the following components:   APPearance HAZY (*)    Glucose, UA >=500 (*)    Hgb urine dipstick SMALL (*)    Ketones, ur 20 (*)    Protein, ur 100 (*)    Leukocytes,Ua SMALL (*)    All other components within normal limits  BRAIN NATRIURETIC PEPTIDE - Abnormal; Notable for the following components:   B Natriuretic Peptide 452.0 (*)    All other components within normal limits  CBG MONITORING, ED - Abnormal; Notable for the following components:   Glucose-Capillary 273 (*)    All other components within normal limits  CULTURE, BLOOD (ROUTINE X 2)  CULTURE, BLOOD (ROUTINE X 2)  URINE CULTURE  SARS CORONAVIRUS 2 (TAT 6-24 HRS)  LACTIC ACID, PLASMA  LACTIC ACID, PLASMA  MAGNESIUM  POC SARS CORONAVIRUS 2 AG -  ED    EKG EKG Interpretation  Date/Time:  Tuesday November 29 2019 10:56:53 EDT Ventricular Rate:  105 PR Interval:    QRS Duration: 100 QT Interval:  383 QTC Calculation: 507 R Axis:   3 Text Interpretation: Sinus tachycardia LVH with secondary repolarization abnormality Anterior Q waves, possibly due to LVH Prolonged QT interval No acute changes BESIDES PROLONGE QT Confirmed by Varney Biles [phone]) on 11/29/2019 11:41:56 AM   Radiology CT Head Wo Contrast  Result Date: 11/29/2019 CLINICAL  DATA:  Encephalopathy EXAM: CT HEAD WITHOUT CONTRAST TECHNIQUE: Contiguous axial images were obtained from the base of the skull through the vertex without intravenous contrast. COMPARISON:  06/29/2019 FINDINGS: Brain: There is no acute intracranial hemorrhage, mass effect, or edema. No new loss of gray-white differentiation. There is no extra-axial fluid collection. Ventricles and sulci are stable in size and configuration. Patchy and confluent areas of hypoattenuation in the supratentorial white matter are nonspecific but probably reflect stable microvascular ischemic changes. There are chronic small vessel infarcts the basal ganglia and adjacent white matter bilaterally. Vascular: There is atherosclerotic calcification at the skull base. Skull: Small foci lucency within the calvarium been present on prior studies. Sinuses/Orbits: Partially imaged extensive paranasal sinus inflammatory changes including a small left maxillary sinus reflect level. Orbits are unremarkable. Other: Patchy right mastoid opacification. IMPRESSION: No acute intracranial abnormality. Stable chronic findings detailed above. Electronically Signed   By: Macy Mis M.D.   On: 11/29/2019 14:12   DG Chest Port 1 View  Result Date: 11/29/2019 CLINICAL DATA:  Cough, fever EXAM: PORTABLE CHEST 1 VIEW COMPARISON:  07/09/2019 FINDINGS: Patient is slightly rotated. Heart size is mildly enlarged, unchanged. Calcific aortic knob. Prominent patchy airspace opacities within the right lower lobe. Milder patchy airspace opacities within the right upper lobe and left upper lobe. Possible small right pleural effusion. No pneumothorax.  IMPRESSION: 1. Patchy bilateral airspace opacities, most confluent in the right lower lobe, suggestive of multifocal pneumonia. Radiographic follow-up to resolution is recommended. 2. Possible small right pleural effusion. Electronically Signed   By: Davina Poke D.O.   On: 11/29/2019 12:02     Procedures .Critical Care Performed by: Amaryllis Dyke, PA-C Authorized by: Amaryllis Dyke, PA-C    CRITICAL CARE Performed by: Kennith Maes   Total critical care time: 30 minutes  Critical care time was exclusive of separately billable procedures and treating other patients.  Critical care was necessary to treat or prevent imminent or life-threatening deterioration.  Critical care was time spent personally by me on the following activities: development of treatment plan with patient and/or surrogate as well as nursing, discussions with consultants, evaluation of patient's response to treatment, examination of patient, obtaining history from patient or surrogate, ordering and performing treatments and interventions, ordering and review of laboratory studies, ordering and review of radiographic studies, pulse oximetry and re-evaluation of patient's condition.    (including critical care time)  Medications Ordered in ED Medications  ceFEPIme (MAXIPIME) 2 g in sodium chloride 0.9 % 100 mL IVPB (has no administration in time range)  acetaminophen (TYLENOL) tablet 650 mg (has no administration in time range)  ceFEPIme (MAXIPIME) 2 g in sodium chloride 0.9 % 100 mL IVPB (0 g Intravenous Stopped 11/29/19 1247)  azithromycin (ZITHROMAX) 500 mg in sodium chloride 0.9 % 250 mL IVPB (500 mg Intravenous New Bag/Given 11/29/19 1306)  Tdap (BOOSTRIX) injection 0.5 mL (0.5 mLs Intramuscular Given 11/29/19 1334)    ED Course  I have reviewed the triage vital signs and the nursing notes.  Pertinent labs & imaging results that were available during my care of the patient were reviewed by me and considered in my medical decision making (see chart for details).    SAMANTHAN DUGO was evaluated in Emergency Department on 11/29/2019 for the symptoms described in the history of present illness. He/she was evaluated in the context of the global COVID-19 pandemic, which  necessitated consideration that the patient might be at risk for infection with the SARS-CoV-2 virus that causes COVID-19. Institutional protocols and algorithms that pertain to the evaluation of patients at risk for COVID-19 are in a state of rapid change based on information released by regulatory bodies including the CDC and federal and state organizations. These policies and algorithms were followed during the patient's care in the ED.  MDM Rules/Calculators/A&P                     Patient presents to the emergency department with her grandson for evaluation of altered mental status and cough for the past few days.  On arrival patient appears ill but nontoxic, she is tachycardic with a rectal temp of 99.1 and SPO2 ranging 92 to 95%.  She has faint expiratory wheezing as well as some rhonchi at the right base.  Very mild generalized abdominal tenderness.  Symmetric pitting edema to the lower legs.  She does have some injuries from a cat injury to the left lower extremity, will update tetanus, wound care, would not close given time of injury and concern for infection with animal bite/scratch type injury. Given her borderline temp with tachycardia & concern for infection code sepsis activated, start cefepime, hold fluids given hx of CHF with reduced EF on chart review without hypotension on initial assessment.   12:00: Personally reviewed and interpreted chest x-ray, right lower lobe infiltrate, will add  on azithromycin to cover for community-acquired pneumonia.  CBC: Leukocytosis @ 24.1 with left shift. Anemia very mildly worse from prior hgb 11.5 compared to 12.2.  CMP: Renal function improved from prior. Hyperglycemia without acidosis or anion gap elevation to indicate DKA. Hypoalbuminemia. Mild hypocalcemia. No significant electrolyte derangement.  UA: Some signs of infection, but not classic for this- being covered with cefepime initially started.  BNP somewhat elevated.  Lactic: WNL  CXR  radiologist read which I am in agreement with:  1. Patchy bilateral airspace opacities, most confluent in the right lower lobe, suggestive of multifocal pneumonia. Radiographic follow-up to resolution is recommended. 2. Possible small right pleural effusion.  EKG with QTc prolongation, no other significant change, patient had received azithromycin at time of my personal reviewing of EKG- will avoid further QTc prolongating medications, patient is on cardiac monitor, add on mag.   Will consult for admission for sepsis secondary to multifocal pneumonia. COVID rapid testing negative. Remains without hypotension, lactic acid WNL, therefore no 30 cc/kg bolus at this time. Discussed with patient and her grandson at bedside who are in agreement with plan.   15:08: CONSULT: Discussed with hospitalist Dr. Carles Collet- accepts admission.   This is a shared visit with supervising physician Dr. Kathrynn Humble who has independently evaluated patient & provided guidance in evaluation/management/disposition, in agreement with care   Final Clinical Impression(s) / ED Diagnoses Final diagnoses:  Multifocal pneumonia  Altered mental status, unspecified altered mental status type  Sepsis, due to unspecified organism, unspecified whether acute organ dysfunction present [facility])    Rx / DC Orders ED Discharge Orders    None       Amaryllis Dyke, PA-C 11/29/19 1514    Varney Biles, MD 11/29/19 1651

## 2019-11-29 NOTE — Progress Notes (Signed)
Pharmacy Antibiotic Note  Norma [name] is a 86 y.o. female admitted on 11/29/2019 with unknown source.  Pharmacy has been consulted for Cefepime dosing.  Plan: Cefepime 2gm IV q12h F/U cxs and clinical progress Monitor V/S,labs  Height: 5\' 2"  (157.5 cm) Weight: 153 lb (69.4 kg) IBW/kg (Calculated) : 50.1  Temp (24hrs), Avg:99.1 F (37.3 C), Min:99 F (37.2 C), Max:99.1 F (37.3 C)  Recent Labs  Lab 11/29/19 1142 11/29/19 1313  WBC 24.1*  --   CREATININE 1.06*  --   LATICACIDVEN 1.5 1.3    Estimated Creatinine Clearance: 36.7 mL/min (A) (by C-G formula based on SCr of 1.06 mg/dL (H)).    No Known Allergies  Antimicrobials this admission: Cefepime 3/16>>   Dose adjustments this admission: prn  Microbiology results: 3/16 BCx: pending 3/16 UCx: pending  Thank you for allowing pharmacy to be a part of this patient's care.  Isac Sarna, BS [place], [place] Clinical Pharmacist Pager [phone] 11/29/2019 2:46 PM

## 2019-11-29 NOTE — Plan of Care (Signed)
  Problem: Education: Goal: Knowledge of General Education information will improve Description Including pain rating scale, medication(s)/side effects and non-pharmacologic comfort measures Outcome: Progressing   

## 2019-11-29 NOTE — H&P (Signed)
History and Physical  Norma [name] FXT:[id number] DOB: 14-Oct-1933 DOA: 11/29/2019   PCP: Caryl Bis, MD   Patient coming from: Home  Chief Complaint: confusion and sob  HPI:  Norma [name] is a 86 y.o. female with medical history of persistent atrial fibrillation, systolic and diastolic CHF, diabetes mellitus type 2, CKD stage III, chronic pain syndrome, hypertension, severe presenting with 2 to 3-day history of confusion believing that her husband was trying to kill her by giving her medications.  History is supplemented by the patient's grandson at the bedside.  Apparently, the patient has had some coughing and shortness of breath for the past 2 days.  There is been no fevers, chills, hemoptysis, nausea, vomiting, diarrhea, dysuria, hematuria, hematochezia, melena.  The patient herself is a poor historian secondary to her cognitive impairment.  However she was able to answer simple questions.  She does endorse some abdominal pain although she is not able to tell me the duration no alleviating or exacerbating factors.  The patient is essentially bedbound and requires assistance with all her activities of daily living. In the emergency department, patient had low-grade temperature of 99.1 F with not tachycardia in the 100s.  She was hemodynamically stable with oxygen saturation 95% on room air.  BMP was unremarkable as well as LFTs.  WBC was 24.1 with hemoglobin 11.6 and platelets 287,000.  CT of the brain was negative.  Chest x-ray showed bilateral patchy airspace opacities with small right pleural effusion.  Lactic acid was 1.5>>> 1.3.  UA shows 21-50 WBC.  The patient was started on cefepime and azithromycin.  Assessment/Plan: Sepsis -Present on admission -Characterized by altered mental status and elevated white blood cell count -Lactic acid peaked 1.5 -Secondary to pneumonia -Check procalcitonin -Start IV fluids judiciously  Multilobar pneumonia -Given the patient's  frequent antibiotics pressure in the recent past, start cefepime -Add azithromycin -Check procalcitonin -Follow blood cultures  Pyuria -Concerned about UTI  Abdominal pain -CT abdomen and pelvis  Acute metabolic encephalopathy -Secondary to sepsis -Further work-up if no improvement -Patient has underlying cognitive impairment -Palliative medicine consultation  Persistent atrial fibrillation -Continue coreg -Continue apixaban  Chronic systolic and diastolic CHF -10/24/9240 echo EF 45-50%, mild to moderate TR, mild MR -Judicious IV fluids -Daily weights  CKD stage 3 b -baseline creatinine 1.1-1.4 -am bMP  Uncontrolled diabetes mellitus type 2 with hyperglycemia -Hemoglobin A1c -Start low-dose Levemir -NovoLog sliding scale -Holding glipizide  Chronic pain syndrome -Continue Percocet which she takes at home  Depression -Continue Cymbalta  Hyperlipidemia -Continue statin      Past Medical History:  Diagnosis Date  . Anemia of other chronic disease   . Atrial fibrillation ([place])   . CAD (coronary artery disease)    a. cath 2008 - 30% prox Cx, 70% distal Cx treated medically.  . Chronic diastolic CHF (congestive heart failure) ([place])   . Chronic pain syndrome   . CKD (chronic kidney disease), stage III   . History of blood transfusion 1960's   "when my last child was born" (12/23/2012)  . Hypoxemia   . Other chronic pulmonary heart diseases   . Pneumonia    "when I was little" (12/23/2012)  . Stroke [facility] ~ 2011   "slowed my walking" (12/23/2012)  . Takotsubo cardiomyopathy    a. 2012 - dx unclear. Cardiolite OK but no cath at the time due to renal insufficiency. EF was 20-25%, but normalized in 2018.  . Type II diabetes mellitus (  [place])   . Unspecified pleural effusion    Past Surgical History:  Procedure Laterality Date  . ABDOMINAL HYSTERECTOMY    . APPENDECTOMY    . CARDIOVERSION N/A 06/08/2019   Procedure: CARDIOVERSION;  Surgeon: Donato Heinz, MD;  Location: [facility] ENDOSCOPY;  Service: Endoscopy;  Laterality: N/A;  . CATARACT EXTRACTION, BILATERAL    . CHOLECYSTECTOMY    . DILATION AND CURETTAGE OF UTERUS    . ESOPHAGOGASTRODUODENOSCOPY (EGD) WITH PROPOFOL N/A 04/17/2019   Procedure: ESOPHAGOGASTRODUODENOSCOPY (EGD) WITH PROPOFOL;  Surgeon: Otis Brace, MD;  Location: [place];  Service: Gastroenterology;  Laterality: N/A;  . FOREIGN BODY REMOVAL  04/17/2019   Procedure: FOREIGN BODY REMOVAL;  Surgeon: Otis Brace, MD;  Location: [place] ENDOSCOPY;  Service: Gastroenterology;;  . PATELLA RECONSTRUCTION Bilateral    "had my knee caps replaced" (12/23/2012)  . TONSILLECTOMY     Social History:  reports that she has never smoked. She has never used smokeless tobacco. She reports that she does not drink alcohol or use drugs.   Family History  Problem Relation Age of Onset  . Heart attack Father 55       MI  . Cancer Father        leukemia  . Cancer Brother   . Heart disease Daughter      No Known Allergies   Prior to Admission medications   Medication Sig Start Date End Date Taking? Authorizing Provider  apixaban (ELIQUIS) 5 MG TABS tablet Take 1 tablet (5 mg total) by mouth 2 (two) times daily. 06/08/19  Yes Rosita Fire, Brittainy M, PA-C  buPROPion (WELLBUTRIN XL) 300 MG 24 hr tablet  06/07/19  Yes [provider]  carvedilol (COREG) 25 MG tablet Take 1 tablet (25 mg total) by mouth 2 (two) times daily with a meal. 10/27/19  Yes Fenton, Clint R, PA  Cranberry 250 MG CAPS Take 250 mg by mouth daily.   Yes [provider]  dextromethorphan-guaiFENesin (MUCINEX DM) 30-600 MG 12hr tablet Take 1 tablet by mouth 2 (two) times daily as needed for cough.   Yes [provider]  DULoxetine (CYMBALTA) 60 MG capsule Take 60 mg by mouth 2 (two) times daily.    Yes [provider]  glipiZIDE (GLUCOTROL XL) 2.5 MG 24 hr tablet Take 2.5 mg by mouth daily with breakfast.   Yes [provider]   insulin detemir (LEVEMIR) 100 UNIT/ML injection Inject 0.1 mLs (10 Units total) into the skin at bedtime. Patient taking differently: Inject 8-22 Units into the skin at bedtime.  04/21/19  Yes Swayze, Ava, DO  oxyCODONE-acetaminophen (PERCOCET) 10-325 MG tablet Take 1 tablet 4 (four) times daily by mouth. Patient taking differently: Take 1 tablet by mouth 4 (four) times daily as needed for pain.  07/29/17  Yes Debbe Odea, MD  saccharomyces boulardii (FLORASTOR) 250 MG capsule Take 1 capsule (250 mg total) 2 (two) times daily by mouth. Patient taking differently: Take 250 mg by mouth daily.  07/29/17  Yes Debbe Odea, MD  simvastatin (ZOCOR) 20 MG tablet Take 20 mg by mouth daily.    Yes [provider]  ZINC OXIDE, TOPICAL, (DIAPER RASH) 10 % CREA Apply 1 application topically daily as needed.   Yes [provider]    Review of Systems:  Constitutional:  No weight loss, night sweats Head&Eyes: No headache.  No vision loss.  No eye pain or scotoma ENT:  No Difficulty swallowing,Tooth/dental problems,Sore throat,  No ear ache, post nasal drip,  Cardio-vascular:  No chest pain, Orthopnea, PND, swelling in lower extremities,  dizziness, palpitations  GI:  No   nausea, vomiting, diarrhea, loss of appetite, hematochezia, melena, heartburn, indigestion, Resp:  No shortness of breath with exertion or at rest. No cough. No coughing up of blood .No wheezing.No chest wall deformity  Skin:  no rash or lesions.  GU:  no dysuria, change in color of urine, no urgency or frequency. No flank pain.  Musculoskeletal:  No joint pain or swelling. No decreased range of motion. No back pain.  Psych:  No change in mood or affect. No depression or anxiety. Neurologic: No headache, no dysesthesia, no focal weakness, no vision loss. No syncope  Physical Exam: Vitals:   11/29/19 1415 11/29/19 1430 11/29/19 1500 11/29/19 1515  BP:   (!) 136/93   Pulse: (!) 103 (!) 103 81 100  Resp:  (!) 23 (!) 24 18 (!) 21  Temp:      TempSrc:      SpO2: 95% 96% 94% 95%  Weight:      Height:       General:  A&O x 2, NAD, nontoxic, pleasant/cooperative Head/Eye: No conjunctival hemorrhage, no icterus, Arnold/AT, No nystagmus ENT:  No icterus,  No thrush, good dentition, no pharyngeal exudate Neck:  No masses, no lymphadenpathy, no bruits CV:  IRRR, no rub, no gallop, no S3 Lung: bilateral rhonchi, no wheeze Abdomen: soft/NT, +BS, nondistended, no peritoneal signs Ext: No cyanosis, No rashes, No petechiae, No lymphangitis, 1+RLE edema Neuro: CNII-XII intact, strength 4/5 in bilateral upper and lower extremities, no dysmetria  Labs on Admission:  Basic Metabolic Panel: Recent Labs  Lab 11/29/19 1142  NA 137  K 3.6  CL 102  CO2 24  GLUCOSE 300*  BUN 16  CREATININE 1.06*  CALCIUM 8.6*   Liver Function Tests: Recent Labs  Lab 11/29/19 1142  AST 11*  ALT 18  ALKPHOS 117  BILITOT 1.5*  PROT 6.6  ALBUMIN 3.0*   No results for input(s): LIPASE, AMYLASE in the last 168 hours. No results for input(s): AMMONIA in the last 168 hours. CBC: Recent Labs  Lab 11/29/19 1142  WBC 24.1*  NEUTROABS 21.0*  HGB 11.6*  HCT 37.9  MCV 94.0  PLT 207   Coagulation Profile: Recent Labs  Lab 11/29/19 1142  INR 1.6*   Cardiac Enzymes: No results for input(s): CKTOTAL, CKMB, CKMBINDEX, TROPONINI in the last 168 hours. BNP: Invalid input(s): POCBNP CBG: Recent Labs  Lab 11/29/19 1106  GLUCAP 273*   Urine analysis:    Component Value Date/Time   COLORURINE YELLOW 11/29/2019 1428   APPEARANCEUR HAZY (A) 11/29/2019 1428   LABSPEC 1.020 11/29/2019 1428   PHURINE 5.0 11/29/2019 1428   GLUCOSEU >=500 (A) 11/29/2019 1428   HGBUR SMALL (A) 11/29/2019 1428   BILIRUBINUR NEGATIVE 11/29/2019 1428   KETONESUR 20 (A) 11/29/2019 1428   PROTEINUR 100 (A) 11/29/2019 1428   UROBILINOGEN 1.0 04/25/2008 1210   NITRITE NEGATIVE 11/29/2019 1428   LEUKOCYTESUR SMALL (A) 11/29/2019 1428    Sepsis Labs: @LABRCNTIP (procalcitonin:4,lacticidven:4) ) Recent Results (from the past 240 hour(s))  Blood Culture (routine x 2)     Status: None (Preliminary result)   Collection Time: 11/29/19 11:43 AM   Specimen: BLOOD LEFT ARM  Result Value Ref Range Status   Specimen Description BLOOD LEFT ARM  Final   Special Requests   Final    BOTTLES DRAWN AEROBIC AND ANAEROBIC Blood Culture results may not be optimal due to an inadequate volume  of blood received in culture bottles Performed at [facility], [address], [place], [place] [address]    Culture PENDING  Incomplete   Report Status PENDING  Incomplete  Blood Culture (routine x 2)     Status: None (Preliminary result)   Collection Time: 11/29/19  1:13 PM   Specimen: BLOOD LEFT ARM  Result Value Ref Range Status   Specimen Description BLOOD LEFT ARM  Final   Special Requests   Final    BOTTLES DRAWN AEROBIC AND ANAEROBIC Blood Culture adequate volume Performed at [facility], [address], [place], [place] [address]    Culture PENDING  Incomplete   Report Status PENDING  Incomplete     Radiological Exams on Admission: CT Head Wo Contrast  Result Date: 11/29/2019 CLINICAL DATA:  Encephalopathy EXAM: CT HEAD WITHOUT CONTRAST TECHNIQUE: Contiguous axial images were obtained from the base of the skull through the vertex without intravenous contrast. COMPARISON:  06/29/2019 FINDINGS: Brain: There is no acute intracranial hemorrhage, mass effect, or edema. No new loss of gray-white differentiation. There is no extra-axial fluid collection. Ventricles and sulci are stable in size and configuration. Patchy and confluent areas of hypoattenuation in the supratentorial white matter are nonspecific but probably reflect stable microvascular ischemic changes. There are chronic small vessel infarcts the basal ganglia and adjacent white matter bilaterally. Vascular: There is atherosclerotic calcification at the skull base. Skull: Small foci  lucency within the calvarium been present on prior studies. Sinuses/Orbits: Partially imaged extensive paranasal sinus inflammatory changes including a small left maxillary sinus reflect level. Orbits are unremarkable. Other: Patchy right mastoid opacification. IMPRESSION: No acute intracranial abnormality. Stable chronic findings detailed above. Electronically Signed   By: Macy Mis M.D.   On: 11/29/2019 14:12   DG Chest Port 1 View  Result Date: 11/29/2019 CLINICAL DATA:  Cough, fever EXAM: PORTABLE CHEST 1 VIEW COMPARISON:  07/09/2019 FINDINGS: Patient is slightly rotated. Heart size is mildly enlarged, unchanged. Calcific aortic knob. Prominent patchy airspace opacities within the right lower lobe. Milder patchy airspace opacities within the right upper lobe and left upper lobe. Possible small right pleural effusion. No pneumothorax. IMPRESSION: 1. Patchy bilateral airspace opacities, most confluent in the right lower lobe, suggestive of multifocal pneumonia. Radiographic follow-up to resolution is recommended. 2. Possible small right pleural effusion. Electronically Signed   By: Davina Poke D.O.   On: 11/29/2019 12:02    EKG: Independently reviewed. Sinus, nonspecific ST changes    Time spent:60 minutes Code Status:   FULL Family Communication:  Grandson updated at bedside Disposition Plan: expect 2-3 day hospitalization Consults called: none DVT Prophylaxis: apixaban  Orson Eva, DO  [facility] Pager [phone]  If 7PM-7AM, please contact night-coverage [email or web address] Password [facility] 11/29/2019, 3:39 PM

## 2019-11-30 ENCOUNTER — Encounter (HOSPITAL_COMMUNITY): Payer: Self-pay | Admitting: Internal Medicine

## 2019-11-30 DIAGNOSIS — J189 Pneumonia, unspecified organism: Secondary | ICD-10-CM

## 2019-11-30 DIAGNOSIS — Z7189 Other specified counseling: Secondary | ICD-10-CM

## 2019-11-30 DIAGNOSIS — Z515 Encounter for palliative care: Secondary | ICD-10-CM

## 2019-11-30 DIAGNOSIS — J188 Other pneumonia, unspecified organism: Secondary | ICD-10-CM

## 2019-11-30 LAB — CBC
HCT: 34.7 % — ABNORMAL LOW (ref 36.0–46.0)
Hemoglobin: 10.7 g/dL — ABNORMAL LOW (ref 12.0–15.0)
MCH: 29 pg (ref 26.0–34.0)
MCHC: 30.8 g/dL (ref 30.0–36.0)
MCV: 94 fL (ref 80.0–100.0)
Platelets: 261 10*3/uL (ref 150–400)
RBC: 3.69 MIL/uL — ABNORMAL LOW (ref 3.87–5.11)
RDW: 14.5 % (ref 11.5–15.5)
WBC: 20.8 10*3/uL — ABNORMAL HIGH (ref 4.0–10.5)
nRBC: 0 % (ref 0.0–0.2)

## 2019-11-30 LAB — GLUCOSE, CAPILLARY
Glucose-Capillary: 127 mg/dL — ABNORMAL HIGH (ref 70–99)
Glucose-Capillary: 40 mg/dL — CL (ref 70–99)
Glucose-Capillary: 96 mg/dL (ref 70–99)
Glucose-Capillary: 96 mg/dL (ref 70–99)
Glucose-Capillary: 67 mg/dL — ABNORMAL LOW (ref 70–99)
Glucose-Capillary: 100 mg/dL — ABNORMAL HIGH (ref 70–99)
Glucose-Capillary: 98 mg/dL (ref 70–99)
Glucose-Capillary: 131 mg/dL — ABNORMAL HIGH (ref 70–99)
Glucose-Capillary: 114 mg/dL — ABNORMAL HIGH (ref 70–99)
Glucose-Capillary: 90 mg/dL (ref 70–99)

## 2019-11-30 LAB — URINE CULTURE

## 2019-11-30 LAB — BASIC METABOLIC PANEL
Anion gap: 8 (ref 5–15)
BUN: 14 mg/dL (ref 8–23)
CO2: 26 mmol/L (ref 22–32)
Calcium: 8.6 mg/dL — ABNORMAL LOW (ref 8.9–10.3)
Chloride: 105 mmol/L (ref 98–111)
Creatinine, Ser: 0.87 mg/dL (ref 0.44–1.00)
GFR calc Af Amer: >60 mL/min (ref >60)
GFR calc non Af Amer: >60 mL/min (ref >60)
Glucose, Bld: 120 mg/dL — ABNORMAL HIGH (ref 70–99)
Potassium: 3 mmol/L — ABNORMAL LOW (ref 3.5–5.1)
Sodium: 139 mmol/L (ref 135–145)

## 2019-11-30 LAB — PROCALCITONIN: Procalcitonin: 0.27 ng/mL

## 2019-11-30 MED ORDER — INSULIN DETEMIR 100 UNIT/ML ~~LOC~~ SOLN
7.0000 [IU] | Freq: Every day | SUBCUTANEOUS | Status: DC
  Filled 2019-11-30: qty 0.07

## 2019-11-30 MED ORDER — DEXTROSE 5 % IV SOLN: INTRAVENOUS | Status: DC

## 2019-11-30 MED ORDER — DEXTROSE 50 % IV SOLN
12.5000 g | Freq: Once | INTRAVENOUS | Status: AC
  Administered 2019-11-30: 12.5 g via INTRAVENOUS

## 2019-11-30 MED ORDER — POTASSIUM CHLORIDE CRYS ER 20 MEQ PO TBCR
40.0000 meq | EXTENDED_RELEASE_TABLET | Freq: Two times a day (BID) | ORAL | Status: AC
  Administered 2019-11-30 (×2): 40 meq via ORAL
  Filled 2019-11-30 (×2): qty 2

## 2019-11-30 MED ORDER — DEXTROSE 50 % IV SOLN
INTRAVENOUS | Status: AC
  Administered 2019-11-30: 12.5 g
  Filled 2019-11-30: qty 50

## 2019-11-30 NOTE — Plan of Care (Signed)
  Problem: Acute Rehab PT Goals(only PT should resolve) Goal: Pt Will Go Supine/Side To Sit Outcome: Progressing Flowsheets (Taken 11/30/2019 1350) Pt will go Supine/Side to Sit:  with minimal assist  with moderate assist Goal: Patient Will Transfer Sit To/From Stand Outcome: Progressing Flowsheets (Taken 11/30/2019 1350) Patient will transfer sit to/from stand:  with minimal assist  with moderate assist Goal: Pt Will Transfer Bed To Chair/Chair To Bed Outcome: Progressing Flowsheets (Taken 11/30/2019 1350) Pt will Transfer Bed to Chair/Chair to Bed: with mod assist Goal: Pt Will Ambulate Outcome: Progressing Flowsheets (Taken 11/30/2019 1350) Pt will Ambulate:  10 feet  with moderate assist  with rolling walker   1:50 PM, 11/30/19 Lonell Grandchild, MPT Physical Therapist with [facility] [phone] office [phone] mobile phone

## 2019-11-30 NOTE — Progress Notes (Addendum)
Addendum  Blood cultures Positive aerobic gram positive cocci. Elodia Florence RN

## 2019-11-30 NOTE — Evaluation (Signed)
Physical Therapy Evaluation Patient Details Name: Norma [name] MRN: [id number] DOB: 16-Aug-1934 Today's Date: 11/30/2019   History of Present Illness  Norma [name] is a 86 y.o. female with medical history of persistent atrial fibrillation, systolic and diastolic CHF, diabetes mellitus type 2, CKD stage III, chronic pain syndrome, hypertension, severe presenting with 2 to 3-day history of confusion believing that her husband was trying to kill her by giving her medications.  History is supplemented by the patient's grandson at the bedside.  Apparently, the patient has had some coughing and shortness of breath for the past 2 days.  There is been no fevers, chills, hemoptysis, nausea, vomiting, diarrhea, dysuria, hematuria, hematochezia, melena.  The patient herself is a poor historian secondary to her cognitive impairment.  However she was able to answer simple questions.  She does endorse some abdominal pain although she is not able to tell me the duration no alleviating or exacerbating factors.  The patient is essentially bedbound and requires assistance with all her activities of daily living.In the emergency department, patient had low-grade temperature of 99.1 F with not tachycardia in the 100s.  She was hemodynamically stable with oxygen saturation 95% on room air.  BMP was unremarkable as well as LFTs.  WBC was 24.1 with hemoglobin 11.6 and platelets 287,000.  CT of the brain was negative.  Chest x-ray showed bilateral patchy airspace opacities with small right pleural effusion.  Lactic acid was 1.5>>> 1.3.  UA shows 21-50 WBC.  The patient was started on cefepime and azithromycin.    Clinical Impression  Patient demonstrates slow labored movement for functional mobility, very unsteady on feet with near loss of balance during transfer to chair and limited to a few steps at bedside due to weakness and poor standing balance while using RW.  Patient tolerated sitting up in chair after therapy -  nursing staff aware.  Patient will benefit from continued physical therapy in hospital and recommended venue below to increase strength, balance, endurance for safe ADLs and gait.     Follow Up Recommendations SNF;Supervision for mobility/OOB;Supervision - Intermittent    Equipment Recommendations  None recommended by PT    Recommendations for Other Services       Precautions / Restrictions Precautions Precautions: Fall Restrictions Weight Bearing Restrictions: No      Mobility  Bed Mobility Overal bed mobility: Needs Assistance Bed Mobility: Supine to Sit     Supine to sit: Mod assist     General bed mobility comments: slow labored movement  Transfers Overall transfer level: Needs assistance Equipment used: Rolling walker (2 wheeled) Transfers: Sit to/from [facility] Sit to Stand: Mod assist Stand pivot transfers: Mod assist;Max assist       General transfer comment: very unsteady with forward flexed trunk due to weakness  Ambulation/Gait Ambulation/Gait assistance: Max assist Gait Distance (Feet): 3 Feet Assistive device: Rolling walker (2 wheeled) Gait Pattern/deviations: Decreased step length - right;Decreased step length - left;Decreased stride length Gait velocity: slow   General Gait Details: limited to 3-4 unsteady short labored side steps at bedside due to weakness, fatigue  Stairs            Wheelchair Mobility    Modified Rankin (Stroke Patients Only)       Balance Overall balance assessment: Needs assistance Sitting-balance support: Feet supported;Bilateral upper extremity supported Sitting balance-Leahy Scale: Fair Sitting balance - Comments: fair/good seated at bedside   Standing balance support: During functional activity;Bilateral upper extremity supported Standing balance-Leahy Scale:  Poor Standing balance comment: using RW                             Pertinent Vitals/Pain Pain Assessment:  0-10 Pain Score: 8  Pain Location: chronic bilateral knee pain Pain Descriptors / Indicators: Aching;Sore;Discomfort Pain Intervention(s): Limited activity within patient's tolerance;Monitored during session    [place] expects to be discharged to:: Private residence Living Arrangements: Spouse/significant other Available Help at Discharge: Family;Available 24 hours/day;Personal care attendant Type of Home: House Home Access: Ramped entrance     Home Layout: One level Home Equipment: Junior - 2 wheels;Shower seat;Bedside commode;Wheelchair - manual;Cane - single point      Prior Function Level of Independence: Needs assistance   Gait / Transfers Assistance Needed: Assisted by spouse for transfers and short household distances using RW  ADL's / Homemaking Assistance Needed: assisted by family        Hand Dominance   Dominant Hand: Right    Extremity/Trunk Assessment   Upper Extremity Assessment Upper Extremity Assessment: Generalized weakness    Lower Extremity Assessment Lower Extremity Assessment: Generalized weakness    Cervical / Trunk Assessment Cervical / Trunk Assessment: Kyphotic  Communication   Communication: No difficulties  Cognition Arousal/Alertness: Awake/alert Behavior During Therapy: WFL for tasks assessed/performed Overall Cognitive Status: Within Functional Limits for tasks assessed                                        General Comments      Exercises     Assessment/Plan    PT Assessment Patient needs continued PT services  PT Problem List Decreased strength;Decreased activity tolerance;Decreased balance;Decreased mobility       PT Treatment Interventions Gait training;Functional mobility training;Therapeutic activities;Therapeutic exercise;Patient/family education;Wheelchair mobility training    PT Goals (Current goals can be found in the Care Plan section)  Acute Rehab PT Goals Patient Stated  Goal: return home with family to assist PT Goal Formulation: With patient Time For Goal Achievement: 12/14/19 Potential to Achieve Goals: Good    Frequency Min 3X/week   Barriers to discharge        Co-evaluation               AM-PAC PT "6 Clicks" Mobility  Outcome Measure Help needed turning from your back to your side while in a flat bed without using bedrails?: A Lot Help needed moving from lying on your back to sitting on the side of a flat bed without using bedrails?: A Lot Help needed moving to and from a bed to a chair (including a wheelchair)?: A Lot Help needed standing up from a chair using your arms (e.g., wheelchair or bedside chair)?: A Lot Help needed to walk in hospital room?: A Lot Help needed climbing 3-5 steps with a railing? : Total 6 Click Score: 11    End of Session   Activity Tolerance: Patient tolerated treatment well;Patient limited by fatigue Patient left: in chair;with call bell/phone within reach Nurse Communication: Mobility status PT Visit Diagnosis: Unsteadiness on feet (R26.81);Other abnormalities of gait and mobility (R26.89);Muscle weakness (generalized) (M62.81)    Time: 5732-2025 PT Time Calculation (min) (ACUTE ONLY): 36 min   Charges:   PT Evaluation $PT Eval Moderate Complexity: 1 Mod PT Treatments $Therapeutic Activity: 23-37 mins        1:48 PM, 11/30/19 Lonell Grandchild, MPT  Physical Therapist with [facility] [phone] office [phone] mobile phone

## 2019-11-30 NOTE — Progress Notes (Addendum)
PROGRESS NOTE    Norma [name]  MYT:[id number] DOB: March 05, 1934 DOA: 11/29/2019 PCP: Caryl Bis, MD   Brief Narrative:  Per HPI: Norma [name] is a 86 y.o. female with medical history of persistent atrial fibrillation, systolic and diastolic CHF, diabetes mellitus type 2, CKD stage III, chronic pain syndrome, hypertension, severe presenting with 2 to 3-day history of confusion believing that her husband was trying to kill her by giving her medications.  History is supplemented by the patient's grandson at the bedside.  Apparently, the patient has had some coughing and shortness of breath for the past 2 days.  There is been no fevers, chills, hemoptysis, nausea, vomiting, diarrhea, dysuria, hematuria, hematochezia, melena.  The patient herself is a poor historian secondary to her cognitive impairment.  However she was able to answer simple questions.  She does endorse some abdominal pain although she is not able to tell me the duration no alleviating or exacerbating factors.  The patient is essentially bedbound and requires assistance with all her activities of daily living. In the emergency department, patient had low-grade temperature of 99.1 F with not tachycardia in the 100s.  She was hemodynamically stable with oxygen saturation 95% on room air.  BMP was unremarkable as well as LFTs.  WBC was 24.1 with hemoglobin 11.6 and platelets 287,000.  CT of the brain was negative.  Chest x-ray showed bilateral patchy airspace opacities with small right pleural effusion.  Lactic acid was 1.5>>> 1.3.  UA shows 21-50 WBC.  The patient was started on cefepime and azithromycin.  3/17: Patient was admitted with sepsis secondary to multi lobar pneumonia as well as suspected UTI.  Leukocytosis is improving this morning, no fevers noted overnight.  She has had some hypoglycemia overnight for which she was started on D5 IV fluid.  I have held Levemir for now.  Will monitor oral intake and blood glucose levels  carefully throughout the day.  PT evaluation ordered.  Blood cultures thus far negative and urine cultures pending.  Continue cefepime and azithromycin.  Assessment & Plan:   Active Problems:   Chronic kidney disease (CKD), stage III (moderate) (HCC)   Uncontrolled type 2 diabetes mellitus with hyperglycemia, with long-term current use of insulin (HCC)   Persistent atrial fibrillation (HCC)   Sepsis due to undetermined organism (HCC)   Chronic combined systolic and diastolic congestive heart failure (HCC)   Lobar pneumonia (HCC)   Sepsis likely secondary to multilobar pneumonia as well as possible UTI -Continue vancomycin and cefepime -Follow cultures -Leukocytosis improving, follow CBC  Acute metabolic encephalopathy secondary to above -Patient with underlying cognitive impairment -Appreciate palliative consultation  Hypoglycemia in setting of type 2 diabetes-uncontrolled -D5 IV fluid at gentle rate for now -Hold Levemir -Hemoglobin A1c 9.4% -Gentle SSI  Hypokalemia -Replete orally -Recheck labs with magnesium in a.m.  Persistent atrial fibrillation -Continue Coreg and apixaban with telemetry monitoring  Abdominal pain -No acute findings on CT abdomen and pelvis -Monitor for BM and provide laxation as needed  Chronic systolic and diastolic CHF -Gentle IV fluid with D5 -Monitor daily weights -2D echocardiogram 04/2019 with LVEF 45-50% and mild to moderate TR, mild MR  CKD stage IIIb -Baseline creatinine 1.1-1.4  Chronic pain syndrome -Continue Percocet  Depression -Continue Cymbalta  Dyslipidemia -Continue statin  DVT prophylaxis: Apixaban Code Status: Full Family Communication: We will update family Disposition Plan: Continue current treatment plan with palliative and PT evaluation pending.  Monitor for ongoing hypoglycemia.  Likely need for placement.  Consultants:   Palliative  Procedures:   See below  Antimicrobials:  Anti-infectives (From  admission, onward)   Start     Dose/Rate Route Frequency Ordered Stop   11/30/19 2200  ceFEPIme (MAXIPIME) 2 g in sodium chloride 0.9 % 100 mL IVPB     2 g 200 mL/hr over 30 Minutes Intravenous Every 12 hours 11/29/19 1449     11/30/19 1000  azithromycin (ZITHROMAX) tablet 500 mg     500 mg Oral Daily 11/29/19 1550     11/29/19 1215  azithromycin (ZITHROMAX) 500 mg in sodium chloride 0.9 % 250 mL IVPB     500 mg 250 mL/hr over 60 Minutes Intravenous  Once 11/29/19 1202 11/29/19 1406   11/29/19 1200  ceFEPIme (MAXIPIME) 2 g in sodium chloride 0.9 % 100 mL IVPB     2 g 200 mL/hr over 30 Minutes Intravenous  Once 11/29/19 1126 11/29/19 1247       Subjective: Patient seen and evaluated today with no new acute complaints or concerns.  She appears confused and has been hypoglycemic overnight.  Started on D5 IV fluid.  Minimal oral intake noted.  Objective: Vitals:   11/29/19 2211 11/30/19 0146 11/30/19 0604 11/30/19 0800  BP: (!) 154/91 (!) 146/94 (!) 159/92   Pulse: 98 (!) 104 (!) 103   Resp: 20 20 20    Temp: 98.9 F (37.2 C) (!) 97.5 F (36.4 C) 97.6 F (36.4 C)   TempSrc: Oral Oral Oral   SpO2: 99% 99% 95% 98%  Weight:      Height:        Intake/Output Summary (Last 24 hours) at 11/30/2019 0818 Last data filed at 11/30/2019 0400 Gross per 24 hour  Intake 750 ml  Output --  Net 750 ml   Filed Weights   11/29/19 1043 11/29/19 1719  Weight: 69.4 kg 60.6 kg    Examination:  General exam: Appears calm and comfortable, confused Respiratory system: Clear to auscultation. Respiratory effort normal.  On room air. Cardiovascular system: S1 & S2 heard, RRR. No JVD, murmurs, rubs, gallops or clicks. No pedal edema. Gastrointestinal system: Abdomen is nondistended, soft and nontender. No organomegaly or masses felt. Normal bowel sounds heard. Central nervous system: Appears confused, but responds appropriately to questioning Extremities: No significant edema Skin: No rashes,  lesions or ulcers Psychiatry: Flat affect    Data Reviewed: I have personally reviewed following labs and imaging studies  CBC: Recent Labs  Lab 11/29/19 1142 11/30/19 0623  WBC 24.1* 20.8*  NEUTROABS 21.0*  --   HGB 11.6* 10.7*  HCT 37.9 34.7*  MCV 94.0 94.0  PLT 207 938   Basic Metabolic Panel: Recent Labs  Lab 11/29/19 1142 11/30/19 0623  NA 137 139  K 3.6 3.0*  CL 102 105  CO2 24 26  GLUCOSE 300* 120*  BUN 16 14  CREATININE 1.06* 0.87  CALCIUM 8.6* 8.6*  MG 1.8  --    GFR: Estimated Creatinine Clearance: 42 mL/min (by C-G formula based on SCr of 0.87 mg/dL). Liver Function Tests: Recent Labs  Lab 11/29/19 1142  AST 11*  ALT 18  ALKPHOS 117  BILITOT 1.5*  PROT 6.6  ALBUMIN 3.0*   No results for input(s): LIPASE, AMYLASE in the last 168 hours. No results for input(s): AMMONIA in the last 168 hours. Coagulation Profile: Recent Labs  Lab 11/29/19 1142  INR 1.6*   Cardiac Enzymes: No results for input(s): CKTOTAL, CKMB, CKMBINDEX, TROPONINI in the last 168 hours. BNP (  last 3 results) No results for input(s): PROBNP in the last 8760 hours. HbA1C: Recent Labs    11/29/19 1142  HGBA1C 9.4*   CBG: Recent Labs  Lab 11/30/19 0409 11/30/19 0419 11/30/19 0459 11/30/19 0532 11/30/19 0732  GLUCAP 96 96 67* 100* 98   Lipid Profile: No results for input(s): CHOL, HDL, LDLCALC, TRIG, CHOLHDL, LDLDIRECT in the last 72 hours. Thyroid Function Tests: No results for input(s): TSH, T4TOTAL, FREET4, T3FREE, THYROIDAB in the last 72 hours. Anemia Panel: No results for input(s): VITAMINB12, FOLATE, FERRITIN, TIBC, IRON, RETICCTPCT in the last 72 hours. Sepsis Labs: Recent Labs  Lab 11/29/19 1142 11/29/19 1313 11/30/19 0623  PROCALCITON 0.16  --  0.27  LATICACIDVEN 1.5 1.3  --     Recent Results (from the past 240 hour(s))  Blood Culture (routine x 2)     Status: None (Preliminary result)   Collection Time: 11/29/19 11:43 AM   Specimen: BLOOD  LEFT ARM  Result Value Ref Range Status   Specimen Description BLOOD LEFT ARM  Final   Special Requests   Final    BOTTLES DRAWN AEROBIC AND ANAEROBIC Blood Culture results may not be optimal due to an inadequate volume of blood received in culture bottles   Culture   Final    NO GROWTH < 24 HOURS Performed at [facility], [address] [place], [place] [address]    Report Status PENDING  Incomplete  Blood Culture (routine x 2)     Status: None (Preliminary result)   Collection Time: 11/29/19  1:13 PM   Specimen: BLOOD LEFT ARM  Result Value Ref Range Status   Specimen Description BLOOD LEFT ARM  Final   Special Requests   Final    BOTTLES DRAWN AEROBIC AND ANAEROBIC Blood Culture adequate volume   Culture   Final    NO GROWTH < 24 HOURS Performed at [facility], [address] [place], [place] [address]    Report Status PENDING  Incomplete  SARS CORONAVIRUS 2 (TAT 6-24 HRS) Nasopharyngeal Nasopharyngeal Swab     Status: None   Collection Time: 11/29/19  2:38 PM   Specimen: Nasopharyngeal Swab  Result Value Ref Range Status   SARS Coronavirus 2 NEGATIVE NEGATIVE Final    Comment: (NOTE) SARS-CoV-2 target nucleic acids are NOT DETECTED. The SARS-CoV-2 RNA is generally detectable in upper and lower respiratory specimens during the acute phase of infection. Negative results do not preclude SARS-CoV-2 infection, do not rule out co-infections with other pathogens, and should not be used as the sole basis for treatment or other patient management decisions. Negative results must be combined with clinical observations, patient history, and epidemiological information. The expected result is Negative. Fact Sheet for Patients: SugarRoll.be Fact Sheet for Healthcare Providers: https://www.woods-mathews.com/ This test is not yet approved or cleared by the [place] FDA and  has been authorized for detection and/or diagnosis of SARS-CoV-2  by FDA under an Emergency Use Authorization (EUA). This EUA will remain  in effect (meaning this test can be used) for the duration of the COVID-19 declaration under Section 56 4(b)(1) of the Act, 21 U.S.C. section 360bbb-3(b)(1), unless the authorization is terminated or revoked sooner. Performed at [facility], [place] [address] [place],  [address]          Radiology Studies: CT ABDOMEN PELVIS WO CONTRAST  Result Date: 11/29/2019 CLINICAL DATA:  Coughing and confusionabdominal distension abdominal pain, sepsis, fever EXAM: CT ABDOMEN AND PELVIS WITHOUT CONTRAST TECHNIQUE: Multidetector CT  imaging of the abdomen and pelvis was performed following the standard protocol without IV contrast. COMPARISON:  CT abdomen 06/29/2019 FINDINGS: Lower chest: Increased angular consolidation at the RIGHT lung base with several discrete foci of nodularity. Findings concerning for pneumonia or aspiration pneumonitis. Hepatobiliary: No focal hepatic lesion on noncontrast exam. Postcholecystectomy. Pancreas: Pancreas is fatty replaced Spleen: Normal spleen Adrenals/urinary tract: Adrenal glands and kidneys are normal. The ureters and bladder normal. Stomach/Bowel: Stomach, small-bowel and cecum normal. Post appendectomy. The colon and rectosigmoid colon are normal. Vascular/Lymphatic: Abdominal aorta is normal caliber with atherosclerotic calcification. There is no retroperitoneal or periportal lymphadenopathy. No pelvic lymphadenopathy. Reproductive: Post hysterectomy Other: No free fluid. Musculoskeletal: Degenerative changes of the lumbar spine IMPRESSION: 1. Increased angular consolidation and nodularity at the RIGHT lower lobe and RIGHT middle lobe concerning for pneumonia versus aspiration pneumonitis. 2. No acute findings in the abdomen or pelvis. 3. Extensive vascular calcification of aorta and coronary arteries. 4. Post cholecystectomy and appendectomy. Electronically Signed   By: Suzy Bouchard M.D.   On: 11/29/2019 19:34   CT Head Wo Contrast  Result Date: 11/29/2019 CLINICAL DATA:  Encephalopathy EXAM: CT HEAD WITHOUT CONTRAST TECHNIQUE: Contiguous axial images were obtained from the base of the skull through the vertex without intravenous contrast. COMPARISON:  06/29/2019 FINDINGS: Brain: There is no acute intracranial hemorrhage, mass effect, or edema. No new loss of gray-white differentiation. There is no extra-axial fluid collection. Ventricles and sulci are stable in size and configuration. Patchy and confluent areas of hypoattenuation in the supratentorial white matter are nonspecific but probably reflect stable microvascular ischemic changes. There are chronic small vessel infarcts the basal ganglia and adjacent white matter bilaterally. Vascular: There is atherosclerotic calcification at the skull base. Skull: Small foci lucency within the calvarium been present on prior studies. Sinuses/Orbits: Partially imaged extensive paranasal sinus inflammatory changes including a small left maxillary sinus reflect level. Orbits are unremarkable. Other: Patchy right mastoid opacification. IMPRESSION: No acute intracranial abnormality. Stable chronic findings detailed above. Electronically Signed   By: Macy Mis M.D.   On: 11/29/2019 14:12   US Venous Img Lower Unilateral Right (DVT)  Result Date: 11/29/2019 CLINICAL DATA:  Right leg swelling EXAM: RIGHT LOWER EXTREMITY VENOUS DOPPLER ULTRASOUND TECHNIQUE: Gray-scale sonography with compression, as well as color and duplex ultrasound, were performed to evaluate the deep venous system(s) from the level of the common femoral vein through the popliteal and proximal calf veins. COMPARISON:  None. FINDINGS: VENOUS Normal compressibility of the common femoral, superficial femoral, and popliteal veins, as well as the visualized calf veins. Visualized portions of profunda femoral vein and great saphenous vein unremarkable. No filling defects to  suggest DVT on grayscale or color Doppler imaging. Doppler waveforms show normal direction of venous flow, normal respiratory phasicity and response to augmentation. Limited views of the contralateral common femoral vein are unremarkable. OTHER None. Limitations: none IMPRESSION: No evidence of right lower extremity DVT. Electronically Signed   By: Rolm Baptise M.D.   On: 11/29/2019 16:42   DG Chest Port 1 View  Result Date: 11/29/2019 CLINICAL DATA:  Cough, fever EXAM: PORTABLE CHEST 1 VIEW COMPARISON:  07/09/2019 FINDINGS: Patient is slightly rotated. Heart size is mildly enlarged, unchanged. Calcific aortic knob. Prominent patchy airspace opacities within the right lower lobe. Milder patchy airspace opacities within the right upper lobe and left upper lobe. Possible small right pleural effusion. No pneumothorax. IMPRESSION: 1. Patchy bilateral airspace opacities, most confluent in the right lower lobe, suggestive of multifocal  pneumonia. Radiographic follow-up to resolution is recommended. 2. Possible small right pleural effusion. Electronically Signed   By: Davina Poke D.O.   On: 11/29/2019 12:02        Scheduled Meds: . apixaban  5 mg Oral BID  . azithromycin  500 mg Oral Daily  . carvedilol  25 mg Oral BID WC  . DULoxetine  60 mg Oral BID  . insulin aspart  0-9 Units Subcutaneous TID WC  . potassium chloride  40 mEq Oral BID  . saccharomyces boulardii  250 mg Oral Daily  . simvastatin  20 mg Oral Daily   Continuous Infusions: . ceFEPime (MAXIPIME) IV    . dextrose 75 mL/hr at 11/30/19 0541     LOS: 1 day    Time spent: 35 minutes    Julionna Marczak D Manuella Ghazi, DO [facility]  If 7PM-7AM, please contact night-coverage [email or web address] 11/30/2019, 8:18 AM

## 2019-11-30 NOTE — Consult Note (Signed)
Consultation Note Date: 11/30/2019   Patient Name: Norma [name]  DOB: 10/21/1933  MRN: [id number]  Age / Sex: 86 y.o., female  PCP: Caryl Bis, MD Referring Physician: Rodena Goldmann, DO  Reason for Consultation: Establishing goals of care and Psychosocial/spiritual support  HPI/Patient Profile: 86 y.o. female  with past medical history of A. fib, CAD, chronic diastolic heart failure, chronic pain syndrome, CKD 3, stroke in 2011, DM 2, anemia of chronic disease, admitted on 11/29/2019 with sepsis secondary to pneumonia, possible UTI.   Clinical Assessment and Goals of Care: I have reviewed medical records including EPIC notes, labs and imaging, received report from attending, examined the patient and met at bedside to discuss diagnosis prognosis, GOC, EOL wishes, disposition and options.  Norma [name] appears chronically ill and frail.  She is alert and oriented x3, making and mostly keeping eye contact.  She is able to make her basic needs known.  There is no family at bedside at this time.  I asked how she is feeling, and she tells me not good.  I shared that she looks better than anticipated, and is improving, more alert and oriented.  I introduced Palliative Medicine as specialized medical care for people living with serious illness. It focuses on providing relief from the symptoms and stress of a serious illness.    As far as functional and nutritional status, Norma [name] tells me that prior to the admission she was living at home with her husband.  She had assistance with bathing from an aide and her spouse.   Call to Mr. Humble to talk about Norma [name]'s health concerns.  Mr. Jager shares that their daughter, Sherilyn Dacosta, and her daughter, Calle Schader stays with them most of the time, "since we have been sick".  Mr. Bundick also sounds quite frail.  He tells me that Norma [name] has an aid  with Medicaid who comes comes 8am to 2pm.    We talk about home vs rehab.  Mr. Lheureux shares that family thinks she should go to rehab, but he does not believe Norma [name] will agree.  Mr. Finerty tells me that it is getting hard for him to help her at night, she is incontinent of urine, and they have to change clothes every morning.  He tells me that she uses diaper, but it comes through.  He shares that he is looked into getting a pure wick for home use, but this is $500 and $200 per month.     We talked about disposition options.  I share that Norma [name] has not been evaluated, but does Mr. Doane thinks that she would accept rehab.  Mr. Holcomb shares that he and his family feel like that she needs to go to a rehab, but he feels like Norma [name] will decline.  I share that the medical team cant make her go to rehab, it is her choice, family must convice her to go.  At this point Mr. Irigoyen does not have a rehab center  in mind.  Advanced directives, concepts specific to code status, were considered and discussed.  Mrs. Amburn tells me that she would not want life support, call to family to verify.  Spouse Bill also agrees to "treat the treatable but no CPR, no intubation.  He tells me that daughter Nevin Bloodgood and granddaughter Elmyra Ricks also accept a DNR status  Questions and concerns were addressed.   PMT to continue to follow.   HCPOA    NEXT OF KIN -spouse, Elma Shands.  He asked that the next time we call for an update that we speak to his granddaughter, Laurissa Cowper who lives in their home, and works from home.   SUMMARY OF RECOMMENDATIONS   Continue to treat the treatable but no CPR, no intubation.  Family states that they would like for her to go to rehab (no choice of facility at this time) but feel like that Norma [name] will decline Would benefit from at home palliative services  Code Status/Advance Care Planning:  DNR -Norma [name] and her husband Rush Landmark endorse DNR status.  Rush Landmark  tells me that daughter Nevin Bloodgood and her granddaughter Elmyra Ricks would also agree to DNR.  Symptom Management:   Per hospitalist, no additional needs at this time.  Palliative Prophylaxis:   Oral Care  Additional Recommendations (Limitations, Scope, Preferences):  Treat the treatable but no CPR, no intubation.  Psycho-social/Spiritual:   Desire for further Chaplaincy support:no  Additional Recommendations: Caregiving  Support/Resources and Education on Hospice  Prognosis:   Unable to determine, based on outcomes.  6 months or less would not be surprising based on decreasing functional status, frailty, chronic disease burden.  Discharge Planning: To be determined.  Family would like rehab states that patient will likely decline      Primary Diagnoses: Present on Admission: . Sepsis due to undetermined organism ([place]) . Persistent atrial fibrillation ([place]) . Chronic kidney disease (CKD), stage III (moderate) (HCC)   I have reviewed the medical record, interviewed the patient and family, and examined the patient. The following aspects are pertinent.  Past Medical History:  Diagnosis Date  . Anemia of other chronic disease   . Atrial fibrillation ([place])   . CAD (coronary artery disease)    a. cath 2008 - 30% prox Cx, 70% distal Cx treated medically.  . Chronic diastolic CHF (congestive heart failure) ([place])   . Chronic pain syndrome   . CKD (chronic kidney disease), stage III   . History of blood transfusion 1960's   "when my last child was born" (12/23/2012)  . Hypoxemia   . Other chronic pulmonary heart diseases   . Pneumonia    "when I was little" (12/23/2012)  . Stroke [facility]) ~ 2011   "slowed my walking" (12/23/2012)  . Takotsubo cardiomyopathy    a. 2012 - dx unclear. Cardiolite OK but no cath at the time due to renal insufficiency. EF was 20-25%, but normalized in 2018.  . Type II diabetes mellitus ([place])   . Unspecified pleural effusion    Social History    Socioeconomic History  . Marital status: Married    Spouse name: Not on file  . Number of children: Not on file  . Years of education: Not on file  . Highest education level: Not on file  Occupational History  . Occupation: RETIRED  Tobacco Use  . Smoking status: Never Smoker  . Smokeless tobacco: Never Used  Substance and Sexual Activity  . Alcohol use: No  . Drug use: No  . Sexual  activity: Never  Other Topics Concern  . Not on file  Social History Narrative  . Not on file   Social Determinants of Health   Financial Resource Strain:   . Difficulty of Paying Living Expenses:   Food Insecurity:   . Worried About Charity fundraiser in the Last Year:   . Arboriculturist in the Last Year:   Transportation Needs:   . Film/video editor (Medical):   Marland Kitchen Lack of Transportation (Non-Medical):   Physical Activity:   . Days of Exercise per Week:   . Minutes of Exercise per Session:   Stress:   . Feeling of Stress :   Social Connections:   . Frequency of Communication with Friends and Family:   . Frequency of Social Gatherings with Friends and Family:   . Attends Religious Services:   . Active Member of Clubs or Organizations:   . Attends Archivist Meetings:   Marland Kitchen Marital Status:    Family History  Problem Relation Age of Onset  . Heart attack Father 78       MI  . Cancer Father        leukemia  . Cancer Brother   . Heart disease Daughter    Scheduled Meds: . apixaban  5 mg Oral BID  . azithromycin  500 mg Oral Daily  . carvedilol  25 mg Oral BID WC  . DULoxetine  60 mg Oral BID  . insulin aspart  0-9 Units Subcutaneous TID WC  . potassium chloride  40 mEq Oral BID  . saccharomyces boulardii  250 mg Oral Daily  . simvastatin  20 mg Oral Daily   Continuous Infusions: . ceFEPime (MAXIPIME) IV     PRN Meds:.acetaminophen **OR** acetaminophen, dextromethorphan-guaiFENesin, ondansetron **OR** ondansetron (ZOFRAN) IV,  oxyCODONE-acetaminophen Medications Prior to Admission:  Prior to Admission medications   Medication Sig Start Date End Date Taking? Authorizing Provider  apixaban (ELIQUIS) 5 MG TABS tablet Take 1 tablet (5 mg total) by mouth 2 (two) times daily. 06/08/19  Yes Rosita Fire, Brittainy M, PA-C  buPROPion (WELLBUTRIN XL) 300 MG 24 hr tablet  06/07/19  Yes [provider]  carvedilol (COREG) 25 MG tablet Take 1 tablet (25 mg total) by mouth 2 (two) times daily with a meal. 10/27/19  Yes Fenton, Clint R, PA  Cranberry 250 MG CAPS Take 250 mg by mouth daily.   Yes [provider]  dextromethorphan-guaiFENesin (MUCINEX DM) 30-600 MG 12hr tablet Take 1 tablet by mouth 2 (two) times daily as needed for cough.   Yes [provider]  DULoxetine (CYMBALTA) 60 MG capsule Take 60 mg by mouth 2 (two) times daily.    Yes [provider]  glipiZIDE (GLUCOTROL XL) 2.5 MG 24 hr tablet Take 2.5 mg by mouth daily with breakfast.   Yes [provider]  insulin detemir (LEVEMIR) 100 UNIT/ML injection Inject 0.1 mLs (10 Units total) into the skin at bedtime. Patient taking differently: Inject 8-22 Units into the skin at bedtime.  04/21/19  Yes Swayze, Ava, DO  oxyCODONE-acetaminophen (PERCOCET) 10-325 MG tablet Take 1 tablet 4 (four) times daily by mouth. Patient taking differently: Take 1 tablet by mouth 4 (four) times daily as needed for pain.  07/29/17  Yes Debbe Odea, MD  saccharomyces boulardii (FLORASTOR) 250 MG capsule Take 1 capsule (250 mg total) 2 (two) times daily by mouth. Patient taking differently: Take 250 mg by mouth daily.  07/29/17  Yes Debbe Odea, MD  simvastatin (ZOCOR) 20 MG tablet Take 20 mg by mouth daily.    Yes [provider]  ZINC OXIDE, TOPICAL, (DIAPER RASH) 10 % CREA Apply 1 application topically daily as needed.   Yes [provider]   No Known Allergies Review of Systems  Unable to perform ROS: Acuity of condition     Physical Exam Vitals and nursing note reviewed.  Constitutional:      General: She is not in acute distress.    Appearance: She is ill-appearing.     Comments: Appears acutely/chronically ill and frail.  Will make an somewhat keep eye contact  Cardiovascular:     Rate and Rhythm: Normal rate.  Pulmonary:     Effort: Pulmonary effort is normal. No respiratory distress.  Abdominal:     Comments: Rounded abdomen, soft  Musculoskeletal:        General: No swelling.  Skin:    General: Skin is warm and dry.     Comments: Bruising to right cheek  Neurological:     Mental Status: She is alert and oriented to person, place, and time.  Psychiatric:     Comments: Calm and cooperative, not fearful     Vital Signs: BP (!) 147/90 (BP Location: Left Arm)   Pulse (!) 103   Temp 98.5 F (36.9 C)   Resp 17   Ht '5\' 2"'$  (1.575 m)   Wt 60.6 kg   SpO2 98%   BMI 24.44 kg/m  Pain Scale: 0-10   Pain Score: 7    SpO2: SpO2: 98 % O2 Device:SpO2: 98 % O2 Flow Rate: .   IO: Intake/output summary:   Intake/Output Summary (Last 24 hours) at 11/30/2019 1140 Last data filed at 11/30/2019 1100 Gross per 24 hour  Intake 750 ml  Output 500 ml  Net 250 ml    LBM: Last BM Date: 11/29/19 Baseline Weight: Weight: 69.4 kg Most recent weight: Weight: 60.6 kg     Palliative Assessment/Data:   Flowsheet Rows     Most Recent Value  Intake Tab  Referral Department  Hospitalist  Unit at Time of Referral  Cardiac/Telemetry Unit  Palliative Care Primary Diagnosis  Sepsis/Infectious Disease  Date Notified  11/29/19  Palliative Care Type  New Palliative care  Reason for referral  Clarify Goals of Care  Date of Admission  11/29/19  Date first seen by Palliative Care  11/30/19  # of days Palliative referral response time  1 Day(s)  # of days IP prior to Palliative referral  0  Clinical Assessment  Palliative Performance Scale Score  40%  Pain Max last 24 hours  Not able to report  Pain Min  Last 24 hours  Not able to report  Dyspnea Max Last 24 Hours  Not able to report  Dyspnea Min Last 24 hours  Not able to report  Psychosocial & Spiritual Assessment  Palliative Care Outcomes      Time In: 1110 Time Out: 1220 Time Total: 70 Greater than 50%  of this time was spent counseling and coordinating care related to the above assessment and plan.  Signed by: Drue Novel, NP   Please contact Palliative Medicine Team phone at [phone] for questions and concerns.  For individual provider: See Shea Evans

## 2019-11-30 NOTE — Progress Notes (Signed)
Spoke with granddaughter and gave family updates. Elodia Florence RN

## 2019-11-30 NOTE — Progress Notes (Addendum)
During rounds, pt noted to be diaphoretic, stating that she was hot and then pointed at the chair and stated "hand me my coat right there" Blood sugar immediately checked, noted 40. 12.5g of dextrose pushed via IV at 0355. BS 127 within 10 mins. Notified Dr. Myna Hidalgo. Levemir decreased from 10 units at bedtime to 7 units. Re-checked BS at 0419 noted to be 96. Will continue to monitor as pt falls asleep when attempting to give po supplements.

## 2019-12-01 ENCOUNTER — Ambulatory Visit: Payer: Medicare HMO | Admitting: Cardiology

## 2019-12-01 DIAGNOSIS — J189 Pneumonia, unspecified organism: Secondary | ICD-10-CM

## 2019-12-01 DIAGNOSIS — R4182 Altered mental status, unspecified: Secondary | ICD-10-CM

## 2019-12-01 DIAGNOSIS — Z7189 Other specified counseling: Secondary | ICD-10-CM

## 2019-12-01 DIAGNOSIS — Z515 Encounter for palliative care: Secondary | ICD-10-CM

## 2019-12-01 LAB — GLUCOSE, CAPILLARY
Glucose-Capillary: 117 mg/dL — ABNORMAL HIGH (ref 70–99)
Glucose-Capillary: 127 mg/dL — ABNORMAL HIGH (ref 70–99)
Glucose-Capillary: 175 mg/dL — ABNORMAL HIGH (ref 70–99)
Glucose-Capillary: 227 mg/dL — ABNORMAL HIGH (ref 70–99)
Glucose-Capillary: 198 mg/dL — ABNORMAL HIGH (ref 70–99)

## 2019-12-01 LAB — BASIC METABOLIC PANEL
Anion gap: 10 (ref 5–15)
BUN: 13 mg/dL (ref 8–23)
CO2: 22 mmol/L (ref 22–32)
Calcium: 8.5 mg/dL — ABNORMAL LOW (ref 8.9–10.3)
Chloride: 105 mmol/L (ref 98–111)
Creatinine, Ser: 0.93 mg/dL (ref 0.44–1.00)
GFR calc Af Amer: >60 mL/min (ref >60)
GFR calc non Af Amer: 57 mL/min — ABNORMAL LOW (ref >60)
Glucose, Bld: 164 mg/dL — ABNORMAL HIGH (ref 70–99)
Potassium: 4 mmol/L (ref 3.5–5.1)
Sodium: 137 mmol/L (ref 135–145)

## 2019-12-01 LAB — CBC
HCT: 33.6 % — ABNORMAL LOW (ref 36.0–46.0)
Hemoglobin: 10.5 g/dL — ABNORMAL LOW (ref 12.0–15.0)
MCH: 29 pg (ref 26.0–34.0)
MCHC: 31.3 g/dL (ref 30.0–36.0)
MCV: 92.8 fL (ref 80.0–100.0)
Platelets: 262 10*3/uL (ref 150–400)
RBC: 3.62 MIL/uL — ABNORMAL LOW (ref 3.87–5.11)
RDW: 14.6 % (ref 11.5–15.5)
WBC: 16.7 10*3/uL — ABNORMAL HIGH (ref 4.0–10.5)
nRBC: 0 % (ref 0.0–0.2)

## 2019-12-01 LAB — MAGNESIUM: Magnesium: 1.6 mg/dL — ABNORMAL LOW (ref 1.7–2.4)

## 2019-12-01 LAB — PROCALCITONIN: Procalcitonin: 0.16 ng/mL

## 2019-12-01 MED ORDER — MAGNESIUM SULFATE 2 GM/50ML IV SOLN: 2.0000 g | Freq: Once | INTRAVENOUS | Status: DC

## 2019-12-01 NOTE — Progress Notes (Signed)
Palliative: Norma [name] is lying quietly in bed.  She greets me making and somewhat keeping eye contact.  She appears chronically ill and frail.  She is able to make her basic needs known.  There is no family at bedside at this time.  Norma [name] tells me that she feels about the same, but I encouraged her that she does look better.  We talked about disposition to rehab for strength and balance.  We talked about outpatient palliative services to follow to help her and her family decide what she does want, what she does not want, what she wants her time to look like.  She is agreeable, there is no preference of outpatient palliative provider.  Conference with attending, bedside nursing staff, transition of care team related to patient condition, needs, goals of care, discharge to rehab, outpatient palliative services.  Plan: To rehab for strength and balance.  Agreeable to outpatient palliative services for further goalsetting.  27 minutes Norma Axe, NP Palliative Medicine Team Team Phone # [phone] Greater than 50% of this time was spent counseling and coordinating care related to the above assessment and plan.

## 2019-12-01 NOTE — Clinical Social Work Note (Signed)
Patient from home with spouse. Daughter and granddaughter have moved into the home to assist with care. Patient ambulates with a walker. She has a BSC and cane in the home. Patient has an aide M-F from 8-1  Through [facility]. Patient will begin with a CAP aide on 12/15/19. Patient and granddaughter are agreeable to SNF for short term rehab.  Referral made to facilities of choice.     Alverta Caccamo, Clydene Pugh, LCSW

## 2019-12-01 NOTE — NC FL2 (Signed)
[place] MEDICAID FL2 LEVEL OF CARE SCREENING TOOL     IDENTIFICATION  Patient Name: Norma [name] Birthdate: Sep 19, 1933 Sex: female Admission Date (Current Location): 11/29/2019  [facility] and [place] Number:  [employer] and Address:  [facility] [address], [place]      Provider Number: [id number]  Attending Physician Name and Address:  Rodena Goldmann, DO  Relative Name and Phone Number:       Current Level of Care: SNF Recommended Level of Care: [place] Prior Approval Number:    Date Approved/Denied:   PASRR Number:    Discharge Plan: SNF    Current Diagnoses: Patient Active Problem List   Diagnosis Date Noted  . Multifocal pneumonia   . Goals of care, counseling/discussion   . Palliative care by specialist   . DNR (do not resuscitate) discussion   . Sepsis due to undetermined organism ([place]) 11/29/2019  . Chronic combined systolic and diastolic congestive heart failure ([place]) 11/29/2019  . Lobar pneumonia ([place]) 11/29/2019  . Secondary hypercoagulable state ([place]) 10/27/2019  . Atypical atrial flutter ([place]) 06/16/2019  . Chronic diastolic heart failure ([place])   . Persistent atrial fibrillation ([place]) 04/18/2019  . Atrial fibrillation with RVR ([place]) 04/17/2019  . History of completed stroke 04/17/2019  . Food impaction of esophagus   . Complicated UTI (urinary tract infection) 07/29/2017  . Bacteremia due to Klebsiella pneumoniae 07/28/2017  . Klebsiella sepsis ([place]) 07/28/2017  . Acute pyelonephritis 07/27/2017  . Acute renal failure superimposed on stage 3 chronic kidney disease ([place]) 07/26/2017  . Uncontrolled type 2 diabetes mellitus with hyperglycemia, with long-term current use of insulin ([place]) 07/26/2017  . Acute metabolic encephalopathy 27/74/1287  . Sepsis ([place]) 06/18/2017  . Acute renal failure superimposed on stage 2 chronic kidney disease ([place]) 06/18/2017  . Abdominal pain   . Urinary tract  infection without hematuria   . Lower urinary tract infectious disease 02/26/2016  . Weakness generalized 02/26/2016  . Weakness 02/26/2016  . DM2 (diabetes mellitus, type 2) ([place]) 02/26/2016  . Hemoptysis 12/24/2012  . Dyspnea on exertion 03/08/2012  . Palpitations 02/05/2012  . Pulmonary nodule 02/05/2012  . Nausea and vomiting 01/03/2011  . Dilated cardiomyopathy ([place]) 12/04/2010  . Acute on chronic congestive heart failure ([place]) 12/04/2010  . Abnormal EKG 12/04/2010  . Pulmonary hypertension ([place]) 12/04/2010  . Hypotension 12/04/2010  . Chronic kidney disease (CKD), stage III (moderate) ([place]) 12/04/2010  . CAD (coronary artery disease) 12/04/2010    Orientation RESPIRATION BLADDER Height & Weight     Self, Place  Normal Continent Weight: 133 lb 9.6 oz (60.6 kg) Height:  5\' 2"  (157.5 cm)  BEHAVIORAL SYMPTOMS/MOOD NEUROLOGICAL BOWEL NUTRITION STATUS      Continent (Carb modified)  AMBULATORY STATUS COMMUNICATION OF NEEDS Skin   Extensive Assist Verbally Normal                       Personal Care Assistance Level of Assistance  Bathing, Feeding, Dressing Bathing Assistance: Limited assistance Feeding assistance: Independent Dressing Assistance: Limited assistance     Functional Limitations Info  Sight, Hearing, Speech Sight Info: Adequate Hearing Info: Adequate Speech Info: Adequate    SPECIAL CARE FACTORS FREQUENCY  PT (By licensed PT)     PT Frequency: 5x/week              Contractures Contractures Info: Not present    Additional Factors Info  Code Status, Psychotropic, Allergies Code  Status Info: DNR Allergies Info: NKA Psychotropic Info: Cymbalta, Wellbutrin XL         Current Medications (12/01/2019):  This is the current hospital active medication list Current Facility-Administered Medications  Medication Dose Route Frequency Provider Last Rate Last Admin  . acetaminophen (TYLENOL) tablet 650 mg  650 mg Oral Q6H PRN Tat, David, MD        Or  . acetaminophen (TYLENOL) suppository 650 mg  650 mg Rectal Q6H PRN Tat, Shanon Brow, MD      . apixaban Arne Cleveland) tablet 5 mg  5 mg Oral BID Orson Eva, MD   5 mg at 12/01/19 8756  . azithromycin (ZITHROMAX) tablet 500 mg  500 mg Oral Daily Tat, David, MD   500 mg at 12/01/19 4332  . carvedilol (COREG) tablet 25 mg  25 mg Oral BID WC Orson Eva, MD   25 mg at 12/01/19 0831  . ceFEPIme (MAXIPIME) 2 g in sodium chloride 0.9 % 100 mL IVPB  2 g Intravenous Therisa Doyne, MD 200 mL/hr at 12/01/19 0846 2 g at 12/01/19 0846  . dextromethorphan-guaiFENesin (MUCINEX DM) 30-600 MG per 12 hr tablet 1 tablet  1 tablet Oral BID PRN Orson Eva, MD   1 tablet at 12/01/19 0840  . DULoxetine (CYMBALTA) DR capsule 60 mg  60 mg Oral BID Orson Eva, MD   60 mg at 12/01/19 9518  . insulin aspart (novoLOG) injection 0-9 Units  0-9 Units Subcutaneous TID WC Orson Eva, MD   2 Units at 12/01/19 1136  . ondansetron (ZOFRAN) tablet 4 mg  4 mg Oral Q6H PRN Tat, David, MD       Or  . ondansetron (ZOFRAN) injection 4 mg  4 mg Intravenous Q6H PRN Tat, Shanon Brow, MD      . oxyCODONE-acetaminophen (PERCOCET/ROXICET) 5-325 MG per tablet 1 tablet  1 tablet Oral Q6H PRN Orson Eva, MD   1 tablet at 12/01/19 1136  . saccharomyces boulardii (FLORASTOR) capsule 250 mg  250 mg Oral Daily Tat, David, MD   250 mg at 12/01/19 0831  . simvastatin (ZOCOR) tablet 20 mg  20 mg Oral Daily Tat, David, MD   20 mg at 12/01/19 8416     Discharge Medications: Please see discharge summary for a list of discharge medications.  Relevant Imaging Results:  Relevant Lab Results:   Additional Information SSN [id number], Clydene Pugh, LCSW

## 2019-12-01 NOTE — Progress Notes (Signed)
PROGRESS NOTE    Norma [name]  TDV:[id number] DOB: 06-07-1934 DOA: 11/29/2019 PCP: Caryl Bis, MD   Brief Narrative:  Per HPI: Norma Febo Belcheris a 86 y.o.femalewith medical history ofpersistent atrial fibrillation,systolic and diastolic CHF, diabetes mellitus type 2, CKD stage III, chronic pain syndrome, hypertension, severe presenting with 2 to 3-day history of confusion believing that her husband was trying to kill her by giving her medications. History is supplemented by the patient's grandson at the bedside. Apparently, the patient has had some coughing and shortness of breath for the past 2 days. There is been no fevers, chills, hemoptysis, nausea, vomiting, diarrhea, dysuria, hematuria, hematochezia, melena. The patient herself is a poor historian secondary to her cognitive impairment. However she was able to answer simple questions. She does endorse some abdominal pain although she is not able to tell me the duration no alleviating or exacerbating factors. The patient is essentially bedbound and requires assistance with all her activities of daily living. In the emergency department, patient had low-grade temperature of 99.1 F with not tachycardia in the 100s. She was hemodynamically stable with oxygen saturation 95% on room air. BMP was unremarkable as well as LFTs. WBC was 24.1 with hemoglobin 11.6 and platelets 287,000. CT of the brain was negative. Chest x-ray showed bilateral patchy airspace opacities with small right pleural effusion. Lactic acid was 1.5>>>1.3. UA shows 21-50 WBC. The patient was started on cefepime and azithromycin.  3/17: Patient was admitted with sepsis secondary to multi lobar pneumonia as well as suspected UTI.  Leukocytosis is improving this morning, no fevers noted overnight.  She has had some hypoglycemia overnight for which she was started on D5 IV fluid.  I have held Levemir for now.  Will monitor oral intake and blood glucose levels  carefully throughout the day.  PT evaluation ordered.  Blood cultures thus far negative and urine cultures pending.  Continue cefepime and azithromycin.  3/18: Patient is demonstrating continued improvement with decreasing leukocytosis.  Urine cultures with multiple species noted in 1 set of blood cultures with contamination, staph coagulase-negative.  Continue on IV cefepime for now.  CSW working on placement for rehab.  Follow a.m. labs.  Assessment & Plan:   Active Problems:   Chronic kidney disease (CKD), stage III (moderate) (HCC)   Uncontrolled type 2 diabetes mellitus with hyperglycemia, with long-term current use of insulin (HCC)   Persistent atrial fibrillation (HCC)   Sepsis due to undetermined organism (HCC)   Chronic combined systolic and diastolic congestive heart failure (HCC)   Lobar pneumonia (HCC)   Multifocal pneumonia   Goals of care, counseling/discussion   Palliative care by specialist   DNR (do not resuscitate) discussion   Sepsis likely secondary to multilobar pneumonia as well as possible UTI-improving -Continue on cefepime for now -1 set of blood cultures with contaminant, staph coagulase-negative -Urine culture with multiple species -Leukocytosis improving, follow CBC  Acute metabolic encephalopathy secondary to above-improving -Patient with underlying cognitive impairment -Appreciate palliative consultation with patient now DNR -PT evaluation for SNF and patient is agreeable  Hypoglycemia in setting of type 2 diabetes-improved -Continue to hold Levemir for now -Hemoglobin A1c 9.4% -Gentle SSI  Hypomagnesemia -Replete  -Recheck in a.m.  Persistent atrial fibrillation -Continue Coreg and apixaban with telemetry monitoring  Abdominal pain -No acute findings on CT abdomen and pelvis -Monitor for BM and provide laxation as needed  Chronic systolic and diastolic CHF -Gentle IV fluid with D5 -Monitor daily weights -2D echocardiogram 04/2019  with  LVEF 45-50% and mild to moderate TR, mild MR  CKD stage IIIb -Baseline creatinine 1.1-1.4 -Creatinine currently 0.93  Chronic pain syndrome -Continue Percocet  Depression -Continue Cymbalta  Dyslipidemia -Continue statin  DVT prophylaxis: Apixaban Code Status:  DNR Family Communication:  Discussed with granddaughter on phone Disposition Plan: Continue current treatment plan with IV antibiotics.  CSW working on placement to SNF/rehab.  Anticipate discharge in 1-2 days.   Consultants:   Palliative  Procedures:   See below  Anti-infectives (From admission, onward)   Start     Dose/Rate Route Frequency Ordered Stop   11/30/19 2200  ceFEPIme (MAXIPIME) 2 g in sodium chloride 0.9 % 100 mL IVPB     2 g 200 mL/hr over 30 Minutes Intravenous Every 12 hours 11/29/19 1449     11/30/19 1000  azithromycin (ZITHROMAX) tablet 500 mg     500 mg Oral Daily 11/29/19 1550     11/29/19 1215  azithromycin (ZITHROMAX) 500 mg in sodium chloride 0.9 % 250 mL IVPB     500 mg 250 mL/hr over 60 Minutes Intravenous  Once 11/29/19 1202 11/29/19 1406   11/29/19 1200  ceFEPIme (MAXIPIME) 2 g in sodium chloride 0.9 % 100 mL IVPB     2 g 200 mL/hr over 30 Minutes Intravenous  Once 11/29/19 1126 11/29/19 1247       Subjective: Patient seen and evaluated today with no new acute complaints or concerns. No acute concerns or events noted overnight.  Objective: Vitals:   11/30/19 1000 11/30/19 1924 11/30/19 2016 12/01/19 0733  BP: (!) 147/90  (!) 150/82   Pulse: (!) 103  93   Resp: 17  16   Temp: 98.5 F (36.9 C)  99.1 F (37.3 C)   TempSrc:   Oral   SpO2: 98% 94% 100% 97%  Weight:      Height:        Intake/Output Summary (Last 24 hours) at 12/01/2019 1144 Last data filed at 12/01/2019 0400 Gross per 24 hour  Intake 112 ml  Output 400 ml  Net -288 ml   Filed Weights   11/29/19 1043 11/29/19 1719  Weight: 69.4 kg 60.6 kg    Examination:  General exam: Appears calm  and comfortable  Respiratory system: Clear to auscultation. Respiratory effort normal. Cardiovascular system: S1 & S2 heard, RRR. No JVD, murmurs, rubs, gallops or clicks. No pedal edema. Gastrointestinal system: Abdomen is nondistended, soft and nontender. No organomegaly or masses felt. Normal bowel sounds heard. Central nervous system: Alert and oriented. No focal neurological deficits. Extremities: Symmetric 5 x 5 power. Skin: No rashes, lesions or ulcers Psychiatry: Judgement and insight appear normal. Mood & affect appropriate.     Data Reviewed: I have personally reviewed following labs and imaging studies  CBC: Recent Labs  Lab 11/29/19 1142 11/30/19 0623 12/01/19 0707  WBC 24.1* 20.8* 16.7*  NEUTROABS 21.0*  --   --   HGB 11.6* 10.7* 10.5*  HCT 37.9 34.7* 33.6*  MCV 94.0 94.0 92.8  PLT 207 261 102   Basic Metabolic Panel: Recent Labs  Lab 11/29/19 1142 11/30/19 0623 12/01/19 0707  NA 137 139 137  K 3.6 3.0* 4.0  CL 102 105 105  CO2 24 26 22   GLUCOSE 300* 120* 164*  BUN 16 14 13   CREATININE 1.06* 0.87 0.93  CALCIUM 8.6* 8.6* 8.5*  MG 1.8  --  1.6*   GFR: Estimated Creatinine Clearance: 39.3 mL/min (by C-G formula based on SCr of 0.93 mg/dL).  Liver Function Tests: Recent Labs  Lab 11/29/19 1142  AST 11*  ALT 18  ALKPHOS 117  BILITOT 1.5*  PROT 6.6  ALBUMIN 3.0*   No results for input(s): LIPASE, AMYLASE in the last 168 hours. No results for input(s): AMMONIA in the last 168 hours. Coagulation Profile: Recent Labs  Lab 11/29/19 1142  INR 1.6*   Cardiac Enzymes: No results for input(s): CKTOTAL, CKMB, CKMBINDEX, TROPONINI in the last 168 hours. BNP (last 3 results) No results for input(s): PROBNP in the last 8760 hours. HbA1C: Recent Labs    11/29/19 1142  HGBA1C 9.4*   CBG: Recent Labs  Lab 11/30/19 1607 11/30/19 2012 12/01/19 0315 12/01/19 0721 12/01/19 1103  GLUCAP 114* 90 117* 127* 175*   Lipid Profile: No results for  input(s): CHOL, HDL, LDLCALC, TRIG, CHOLHDL, LDLDIRECT in the last 72 hours. Thyroid Function Tests: No results for input(s): TSH, T4TOTAL, FREET4, T3FREE, THYROIDAB in the last 72 hours. Anemia Panel: No results for input(s): VITAMINB12, FOLATE, FERRITIN, TIBC, IRON, RETICCTPCT in the last 72 hours. Sepsis Labs: Recent Labs  Lab 11/29/19 1142 11/29/19 1313 11/30/19 0623 12/01/19 0707  PROCALCITON 0.16  --  0.27 0.16  LATICACIDVEN 1.5 1.3  --   --     Recent Results (from the past 240 hour(s))  Blood Culture (routine x 2)     Status: Abnormal (Preliminary result)   Collection Time: 11/29/19 11:43 AM   Specimen: BLOOD LEFT ARM  Result Value Ref Range Status   Specimen Description   Final    BLOOD LEFT ARM Performed at [facility], [address] [place], [place] [address]    Special Requests   Final    BOTTLES DRAWN AEROBIC AND ANAEROBIC Blood Culture results may not be optimal due to an inadequate volume of blood received in culture bottles Performed at [facility], [address] [place], [place] [address]    Culture  Setup Time   Final    GRAM POSITIVE COCCI Gram Stain Report Called to,Read Back By and Verified With: BENSON,D AT 2563 ON 11/30/19 BY HUFFINES,S.  AEROBIC BOTTLE ONLY Performed at [facility], [address] [place], [place] [address]    Culture (A)  Final    STAPHYLOCOCCUS SPECIES (COAGULASE NEGATIVE) THE SIGNIFICANCE OF ISOLATING THIS ORGANISM FROM A SINGLE SET OF BLOOD CULTURES WHEN MULTIPLE SETS ARE DRAWN IS UNCERTAIN. PLEASE NOTIFY THE MICROBIOLOGY DEPARTMENT WITHIN ONE WEEK IF SPECIATION AND SENSITIVITIES ARE REQUIRED. Performed at [facility], [place] [address], [place], [place] [address]    Report Status PENDING  Incomplete  Blood Culture (routine x 2)     Status: None (Preliminary result)   Collection Time: 11/29/19  1:13 PM   Specimen: BLOOD LEFT ARM  Result Value Ref Range Status   Specimen Description BLOOD LEFT ARM  Final   Special  Requests   Final    BOTTLES DRAWN AEROBIC AND ANAEROBIC Blood Culture adequate volume   Culture   Final    NO GROWTH 2 DAYS Performed at [facility], [address] [place], [place] [address]    Report Status PENDING  Incomplete  Urine culture     Status: Abnormal   Collection Time: 11/29/19  2:28 PM   Specimen: Urine, Catheterized  Result Value Ref Range Status   Specimen Description   Final    URINE, CATHETERIZED Performed at [facility], [address], [place], [place] [address]    Special Requests   Final    NONE  Performed at [facility], [address] [place], [place] [address]    Culture MULTIPLE SPECIES PRESENT, SUGGEST RECOLLECTION (A)  Final   Report Status 11/30/2019 FINAL  Final  SARS CORONAVIRUS 2 (TAT 6-24 HRS) Nasopharyngeal Nasopharyngeal Swab     Status: None   Collection Time: 11/29/19  2:38 PM   Specimen: Nasopharyngeal Swab  Result Value Ref Range Status   SARS Coronavirus 2 NEGATIVE NEGATIVE Final    Comment: (NOTE) SARS-CoV-2 target nucleic acids are NOT DETECTED. The SARS-CoV-2 RNA is generally detectable in upper and lower respiratory specimens during the acute phase of infection. Negative results do not preclude SARS-CoV-2 infection, do not rule out co-infections with other pathogens, and should not be used as the sole basis for treatment or other patient management decisions. Negative results must be combined with clinical observations, patient history, and epidemiological information. The expected result is Negative. Fact Sheet for Patients: SugarRoll.be Fact Sheet for Healthcare Providers: https://www.woods-mathews.com/ This test is not yet approved or cleared by the [place] FDA and  has been authorized for detection and/or diagnosis of SARS-CoV-2 by FDA under an Emergency Use Authorization (EUA). This EUA will remain  in effect (meaning this test can be used) for the duration of the COVID-19  declaration under Section 56 4(b)(1) of the Act, 21 U.S.C. section 360bbb-3(b)(1), unless the authorization is terminated or revoked sooner. Performed at [facility], [place] [address], [place], [place] [address]          Radiology Studies: CT ABDOMEN PELVIS WO CONTRAST  Result Date: 11/29/2019 CLINICAL DATA:  Coughing and confusionabdominal distension abdominal pain, sepsis, fever EXAM: CT ABDOMEN AND PELVIS WITHOUT CONTRAST TECHNIQUE: Multidetector CT imaging of the abdomen and pelvis was performed following the standard protocol without IV contrast. COMPARISON:  CT abdomen 06/29/2019 FINDINGS: Lower chest: Increased angular consolidation at the RIGHT lung base with several discrete foci of nodularity. Findings concerning for pneumonia or aspiration pneumonitis. Hepatobiliary: No focal hepatic lesion on noncontrast exam. Postcholecystectomy. Pancreas: Pancreas is fatty replaced Spleen: Normal spleen Adrenals/urinary tract: Adrenal glands and kidneys are normal. The ureters and bladder normal. Stomach/Bowel: Stomach, small-bowel and cecum normal. Post appendectomy. The colon and rectosigmoid colon are normal. Vascular/Lymphatic: Abdominal aorta is normal caliber with atherosclerotic calcification. There is no retroperitoneal or periportal lymphadenopathy. No pelvic lymphadenopathy. Reproductive: Post hysterectomy Other: No free fluid. Musculoskeletal: Degenerative changes of the lumbar spine IMPRESSION: 1. Increased angular consolidation and nodularity at the RIGHT lower lobe and RIGHT middle lobe concerning for pneumonia versus aspiration pneumonitis. 2. No acute findings in the abdomen or pelvis. 3. Extensive vascular calcification of aorta and coronary arteries. 4. Post cholecystectomy and appendectomy. Electronically Signed   By: Suzy Bouchard M.D.   On: 11/29/2019 19:34   CT Head Wo Contrast  Result Date: 11/29/2019 CLINICAL DATA:  Encephalopathy EXAM: CT HEAD WITHOUT CONTRAST  TECHNIQUE: Contiguous axial images were obtained from the base of the skull through the vertex without intravenous contrast. COMPARISON:  06/29/2019 FINDINGS: Brain: There is no acute intracranial hemorrhage, mass effect, or edema. No new loss of gray-white differentiation. There is no extra-axial fluid collection. Ventricles and sulci are stable in size and configuration. Patchy and confluent areas of hypoattenuation in the supratentorial white matter are nonspecific but probably reflect stable microvascular ischemic changes. There are chronic small vessel infarcts the basal ganglia and adjacent white matter bilaterally. Vascular: There is atherosclerotic calcification at the skull base. Skull: Small foci lucency within the calvarium been present on prior studies.  Sinuses/Orbits: Partially imaged extensive paranasal sinus inflammatory changes including a small left maxillary sinus reflect level. Orbits are unremarkable. Other: Patchy right mastoid opacification. IMPRESSION: No acute intracranial abnormality. Stable chronic findings detailed above. Electronically Signed   By: Macy Mis M.D.   On: 11/29/2019 14:12   US Venous Img Lower Unilateral Right (DVT)  Result Date: 11/29/2019 CLINICAL DATA:  Right leg swelling EXAM: RIGHT LOWER EXTREMITY VENOUS DOPPLER ULTRASOUND TECHNIQUE: Gray-scale sonography with compression, as well as color and duplex ultrasound, were performed to evaluate the deep venous system(s) from the level of the common femoral vein through the popliteal and proximal calf veins. COMPARISON:  None. FINDINGS: VENOUS Normal compressibility of the common femoral, superficial femoral, and popliteal veins, as well as the visualized calf veins. Visualized portions of profunda femoral vein and great saphenous vein unremarkable. No filling defects to suggest DVT on grayscale or color Doppler imaging. Doppler waveforms show normal direction of venous flow, normal respiratory phasicity and response  to augmentation. Limited views of the contralateral common femoral vein are unremarkable. OTHER None. Limitations: none IMPRESSION: No evidence of right lower extremity DVT. Electronically Signed   By: Rolm Baptise M.D.   On: 11/29/2019 16:42   DG Chest Port 1 View  Result Date: 11/29/2019 CLINICAL DATA:  Cough, fever EXAM: PORTABLE CHEST 1 VIEW COMPARISON:  07/09/2019 FINDINGS: Patient is slightly rotated. Heart size is mildly enlarged, unchanged. Calcific aortic knob. Prominent patchy airspace opacities within the right lower lobe. Milder patchy airspace opacities within the right upper lobe and left upper lobe. Possible small right pleural effusion. No pneumothorax. IMPRESSION: 1. Patchy bilateral airspace opacities, most confluent in the right lower lobe, suggestive of multifocal pneumonia. Radiographic follow-up to resolution is recommended. 2. Possible small right pleural effusion. Electronically Signed   By: Davina Poke D.O.   On: 11/29/2019 12:02        Scheduled Meds: . apixaban  5 mg Oral BID  . azithromycin  500 mg Oral Daily  . carvedilol  25 mg Oral BID WC  . DULoxetine  60 mg Oral BID  . insulin aspart  0-9 Units Subcutaneous TID WC  . saccharomyces boulardii  250 mg Oral Daily  . simvastatin  20 mg Oral Daily   Continuous Infusions: . ceFEPime (MAXIPIME) IV 2 g (12/01/19 0846)  . magnesium sulfate bolus IVPB       LOS: 2 days    Time spent: 35 minutes    Allyana Vogan Darleen Crocker, DO [facility]  If 7PM-7AM, please contact night-coverage [email or web address] 12/01/2019, 11:44 AM

## 2019-12-02 LAB — CBC
HCT: 35 % — ABNORMAL LOW (ref 36.0–46.0)
Hemoglobin: 10.8 g/dL — ABNORMAL LOW (ref 12.0–15.0)
MCH: 28.5 pg (ref 26.0–34.0)
MCHC: 30.9 g/dL (ref 30.0–36.0)
MCV: 92.3 fL (ref 80.0–100.0)
Platelets: 256 10*3/uL (ref 150–400)
RBC: 3.79 MIL/uL — ABNORMAL LOW (ref 3.87–5.11)
RDW: 14.6 % (ref 11.5–15.5)
WBC: 14.2 10*3/uL — ABNORMAL HIGH (ref 4.0–10.5)
nRBC: 0 % (ref 0.0–0.2)

## 2019-12-02 LAB — GLUCOSE, CAPILLARY
Glucose-Capillary: 180 mg/dL — ABNORMAL HIGH (ref 70–99)
Glucose-Capillary: 191 mg/dL — ABNORMAL HIGH (ref 70–99)
Glucose-Capillary: 159 mg/dL — ABNORMAL HIGH (ref 70–99)
Glucose-Capillary: 146 mg/dL — ABNORMAL HIGH (ref 70–99)

## 2019-12-02 LAB — CULTURE, BLOOD (ROUTINE X 2)

## 2019-12-02 LAB — BASIC METABOLIC PANEL
Anion gap: 10 (ref 5–15)
BUN: 16 mg/dL (ref 8–23)
CO2: 23 mmol/L (ref 22–32)
Calcium: 8.4 mg/dL — ABNORMAL LOW (ref 8.9–10.3)
Chloride: 103 mmol/L (ref 98–111)
Creatinine, Ser: 1.1 mg/dL — ABNORMAL HIGH (ref 0.44–1.00)
GFR calc Af Amer: 54 mL/min — ABNORMAL LOW (ref >60)
GFR calc non Af Amer: 46 mL/min — ABNORMAL LOW (ref >60)
Glucose, Bld: 191 mg/dL — ABNORMAL HIGH (ref 70–99)
Potassium: 4.1 mmol/L (ref 3.5–5.1)
Sodium: 136 mmol/L (ref 135–145)

## 2019-12-02 LAB — RESPIRATORY PANEL BY RT PCR (FLU A&B, COVID)
Influenza A by PCR: NEGATIVE
Influenza B by PCR: NEGATIVE
SARS Coronavirus 2 by RT PCR: NEGATIVE

## 2019-12-02 LAB — MAGNESIUM: Magnesium: 1.6 mg/dL — ABNORMAL LOW (ref 1.7–2.4)

## 2019-12-02 NOTE — Care Management Important Message (Signed)
Important Message  Patient Details  Name: Norma [name] MRN: [id number] Date of Birth: 07-Apr-1934   Medicare Important Message Given:  Yes     Tommy Medal 12/02/2019, 2:44 PM

## 2019-12-02 NOTE — Progress Notes (Signed)
Physical Therapy Treatment Patient Details Name: Norma [name] MRN: [id number] DOB: [birth year] Today's Date: 12/02/2019    History of Present Illness Norma [name] is a 86 y.o. female with medical history of persistent atrial fibrillation, systolic and diastolic CHF, diabetes mellitus type 2, CKD stage III, chronic pain syndrome, hypertension, severe presenting with 2 to 3-day history of confusion believing that her husband was trying to kill her by giving her medications.  History is supplemented by the patient's grandson at the bedside.  Apparently, the patient has had some coughing and shortness of breath for the past 2 days.  There is been no fevers, chills, hemoptysis, nausea, vomiting, diarrhea, dysuria, hematuria, hematochezia, melena.  The patient herself is a poor historian secondary to her cognitive impairment.  However she was able to answer simple questions.  She does endorse some abdominal pain although she is not able to tell me the duration no alleviating or exacerbating factors.  The patient is essentially bedbound and requires assistance with all her activities of daily living.In the emergency department, patient had low-grade temperature of 99.1 F with not tachycardia in the 100s.  She was hemodynamically stable with oxygen saturation 95% on room air.  BMP was unremarkable as well as LFTs.  WBC was 24.1 with hemoglobin 11.6 and platelets 287,000.  CT of the brain was negative.  Chest x-ray showed bilateral patchy airspace opacities with small right pleural effusion.  Lactic acid was 1.5>>> 1.3.  UA shows 21-50 WBC.  The patient was started on cefepime and azithromycin.    PT Comments    Patient demonstrates slow labored movement for sitting up at bedside with difficulty pulling self to sitting using bed rail due to weakness, limited to a few steps at bedside secondary to poor standing balance and requiring tactile assistance to move LLE.  Patient tolerated sitting up in chair after  therapy - RN aware.  Patient will benefit from continued physical therapy in hospital and recommended venue below to increase strength, balance, endurance for safe ADLs and gait.  Patient has bleeding from IV insertion and RN notified.    Follow Up Recommendations  SNF;Supervision for mobility/OOB;Supervision - Intermittent     Equipment Recommendations  None recommended by PT    Recommendations for Other Services       Precautions / Restrictions Precautions Precautions: Fall Restrictions Weight Bearing Restrictions: No    Mobility  Bed Mobility Overal bed mobility: Needs Assistance Bed Mobility: Supine to Sit;Rolling Rolling: Mod assist   Supine to sit: Mod assist;Max assist     General bed mobility comments: slow labored movement, fair/poor carryover for proper hand placement during supine to sitting  Transfers Overall transfer level: Needs assistance Equipment used: Rolling walker (2 wheeled) Transfers: Sit to/from [facility] Sit to Stand: Mod assist Stand pivot transfers: Mod assist;Max assist       General transfer comment: slow unsteady labored movement  Ambulation/Gait Ambulation/Gait assistance: Max assist Gait Distance (Feet): 3 Feet   Gait Pattern/deviations: Decreased step length - right;Decreased step length - left;Decreased stride length Gait velocity: slow   General Gait Details: limited to 3-4 unsteady short labored side steps at bedside due to weakness, fatigue and diffiuclty moving LLE   Stairs             Wheelchair Mobility    Modified Rankin (Stroke Patients Only)       Balance Overall balance assessment: Needs assistance Sitting-balance support: Feet supported;No upper extremity supported Sitting balance-Leahy Scale: [place] Sitting  balance - Comments: fair/good seated at bedside   Standing balance support: During functional activity;Bilateral upper extremity supported Standing balance-Leahy Scale:  Poor Standing balance comment: using RW                            Cognition Arousal/Alertness: Awake/alert Behavior During Therapy: WFL for tasks assessed/performed Overall Cognitive Status: Within Functional Limits for tasks assessed                                        Exercises      General Comments        Pertinent Vitals/Pain Pain Assessment: No/denies pain    Home Living                      Prior Function            PT Goals (current goals can now be found in the care plan section) Acute Rehab PT Goals Patient Stated Goal: return home with family to assist PT Goal Formulation: With patient Time For Goal Achievement: 12/14/19 Potential to Achieve Goals: Good Progress towards PT goals: Progressing toward goals    Frequency    Min 3X/week      PT Plan Current plan remains appropriate    Co-evaluation              AM-PAC PT "6 Clicks" Mobility   Outcome Measure  Help needed turning from your back to your side while in a flat bed without using bedrails?: A Lot Help needed moving from lying on your back to sitting on the side of a flat bed without using bedrails?: A Lot Help needed moving to and from a bed to a chair (including a wheelchair)?: A Lot Help needed standing up from a chair using your arms (e.g., wheelchair or bedside chair)?: A Lot Help needed to walk in hospital room?: A Lot Help needed climbing 3-5 steps with a railing? : Total 6 Click Score: 11    End of Session   Activity Tolerance: Patient tolerated treatment well Patient left: in chair;with call bell/phone within reach;with chair alarm set Nurse Communication: Mobility status PT Visit Diagnosis: Unsteadiness on feet (R26.81);Other abnormalities of gait and mobility (R26.89);Muscle weakness (generalized) (M62.81)     Time: 5643-3295 PT Time Calculation (min) (ACUTE ONLY): 24 min  Charges:  $Therapeutic Activity: 23-37 mins                      9:47 AM, 12/02/19 Lonell Grandchild, MPT Physical Therapist with [facility] [phone] office [phone] mobile phone

## 2019-12-02 NOTE — Progress Notes (Signed)
PROGRESS NOTE    Norma [name]  ZOX:[id number] DOB: 17-Oct-1933 DOA: 11/29/2019 PCP: Caryl Bis, MD   Brief Narrative:  Per HPI: Norma Amison Belcheris a 86 y.o.femalewith medical history ofpersistent atrial fibrillation,systolic and diastolic CHF, diabetes mellitus type 2, CKD stage III, chronic pain syndrome, hypertension, severe presenting with 2 to 3-day history of confusion believing that her husband was trying to kill her by giving her medications. History is supplemented by the patient's grandson at the bedside. Apparently, the patient has had some coughing and shortness of breath for the past 2 days. There is been no fevers, chills, hemoptysis, nausea, vomiting, diarrhea, dysuria, hematuria, hematochezia, melena. The patient herself is a poor historian secondary to her cognitive impairment. However she was able to answer simple questions. She does endorse some abdominal pain although she is not able to tell me the duration no alleviating or exacerbating factors. The patient is essentially bedbound and requires assistance with all her activities of daily living. In the emergency department, patient had low-grade temperature of 99.1 F with not tachycardia in the 100s. She was hemodynamically stable with oxygen saturation 95% on room air. BMP was unremarkable as well as LFTs. WBC was 24.1 with hemoglobin 11.6 and platelets 287,000. CT of the brain was negative. Chest x-ray showed bilateral patchy airspace opacities with small right pleural effusion. Lactic acid was 1.5>>>1.3. UA shows 21-50 WBC. The patient was started on cefepime and azithromycin.  3/17: Patient was admitted with sepsis secondary to multi lobar pneumonia as well as suspected UTI.  Leukocytosis is improving this morning, no fevers noted overnight.  She has had some hypoglycemia overnight for which she was started on D5 IV fluid.  I have held Levemir for now.  Will monitor oral intake and blood glucose levels  carefully throughout the day.  PT evaluation ordered.  Blood cultures thus far negative and urine cultures pending.  Continue cefepime and azithromycin.  3/18: Patient is demonstrating continued improvement with decreasing leukocytosis.  Urine cultures with multiple species noted in 1 set of blood cultures with contamination, staph coagulase-negative.  Continue on IV cefepime for now.  CSW working on placement for rehab.  Follow a.m. labs.  3/19: Appropriately improving; afebrile and denying nausea or vomiting.  Leukocytosis continues trending down.  Will complete 3 full days of IV antibiotics with further transition to oral regimen at time of discharge.  Patient call with repeat test negative and just pending insurance authorization for skilled nursing facility discharge for further care and rehab.  Assessment & Plan:   Active Problems:   Chronic kidney disease (CKD), stage III (moderate) (HCC)   Uncontrolled type 2 diabetes mellitus with hyperglycemia, with long-term current use of insulin (HCC)   Persistent atrial fibrillation (HCC)   Sepsis due to undetermined organism (HCC)   Chronic combined systolic and diastolic congestive heart failure (HCC)   Lobar pneumonia (HCC)   Multifocal pneumonia   Goals of care, counseling/discussion   Palliative care by specialist   DNR (do not resuscitate) discussion   Altered mental status   Sepsis likely secondary to multilobar pneumonia as well as possible UTI-improving/resolving as expected. -Continue on cefepime for another 24 hours -Anticipating discharge on oral cefdinir to complete antibiotic therapy. -1 set of blood cultures with contaminant, staph coagulase-negative -Urine culture with multiple species -Leukocytosis improving appropriately -Continue supportive care and follow clinical response.  Physical deconditioning/acute metabolic encephalopathy secondary to above -improving and essentially at baseline -Patient with underlying  cognitive impairment -Appreciate palliative consultation  with patient now DNR -PT evaluation for SNF and patient/family are in agreement. -Repeat Covid test negative and just waiting on insurance authorization.  Hypoglycemia in setting of poorly controlled type 2 diabetes -stable now -Most likely in the setting of acute infection along with poor oral intake. -Continue to hold Levemir for now -Hemoglobin A1c 9.4% -continue Sensitive SSI and follow CBG's  Hypomagnesemia -Repleted and within normal limits currently. -Advised to maintain adequate nutrition.  Persistent atrial fibrillation -Continue Coreg and apixaban with telemetry monitoring -No signs of overt bleeding. -Rate controlled and stable.  Abdominal pain -No acute findings on CT abdomen and pelvis -Monitor for BM and provide laxation as needed (as patient is chronically on narcotics).  Chronic systolic and diastolic CHF -Gentle IV fluid with D5 given at time of admission -Monitor daily weights and strict I's and O's. -2D echocardiogram 04/2019 with LVEF 45-50% and mild to moderate TR, mild MR -Continue beta-blocker.  CKD stage IIIb -Baseline creatinine 1.1-1.4 -Creatinine currently 0.93 -Continue adequate hydration and follow basic metabolic panels intermittently.  Chronic pain syndrome -Continue as needed Percocet -Overall well controlled.  Depression -Continue Cymbalta -Mood is a stable -Patient denies suicidal ideation or hallucinations.  Dyslipidemia -Continue statin   DVT prophylaxis: Apixaban Code Status:  DNR Family Communication:  No family at bedside.  Family updated by Dr. Manuella Ghazi on 12/01/2019 and also by palliative care service on day prior.  Disposition Plan: Continue current treatment plan with IV antibiotics X another 24 hours.  CSW working on placement to SNF/rehab.  Anticipate discharge in 24 hours on oral abx's.   Consultants:   Palliative  Procedures:   See  below  Anti-infectives (From admission, onward)   Start     Dose/Rate Route Frequency Ordered Stop   11/30/19 2200  ceFEPIme (MAXIPIME) 2 g in sodium chloride 0.9 % 100 mL IVPB     2 g 200 mL/hr over 30 Minutes Intravenous Every 12 hours 11/29/19 1449     11/30/19 1000  azithromycin (ZITHROMAX) tablet 500 mg     500 mg Oral Daily 11/29/19 1550     11/29/19 1215  azithromycin (ZITHROMAX) 500 mg in sodium chloride 0.9 % 250 mL IVPB     500 mg 250 mL/hr over 60 Minutes Intravenous  Once 11/29/19 1202 11/29/19 1406   11/29/19 1200  ceFEPIme (MAXIPIME) 2 g in sodium chloride 0.9 % 100 mL IVPB     2 g 200 mL/hr over 30 Minutes Intravenous  Once 11/29/19 1126 11/29/19 1247       Subjective: No fever, no nausea, no vomiting, no chest pain.  Patient denies feeling her heart racing and expressed he is feeling weak and deconditioned.  No dysuria.  Good oxygen saturation on room air.  Objective: Vitals:   12/01/19 1938 12/01/19 2115 12/02/19 0602 12/02/19 1355  BP:  130/81 (!) 162/94 129/90  Pulse:  80 84 81  Resp:  17 18 18   Temp:  98.2 F (36.8 C) 98.4 F (36.9 C) 99 F (37.2 C)  TempSrc:  Oral Oral Oral  SpO2: 95% 99% 100% 100%  Weight:      Height:        Intake/Output Summary (Last 24 hours) at 12/02/2019 1648 Last data filed at 12/02/2019 0900 Gross per 24 hour  Intake 60 ml  Output --  Net 60 ml   Filed Weights   11/29/19 1043 11/29/19 1719  Weight: 69.4 kg 60.6 kg    Examination: General exam: Alert, awake and cooperative with  examination; patient is afebrile and reports feeling better.  No chest pain, no nausea, no vomiting. Respiratory system: Positive rhonchi; no wheezing, no crackles; normal respiratory effort.   Cardiovascular system: Rate controlled, no rubs, no gallops, no JVD. Gastrointestinal system: Abdomen is nondistended, soft and nontender. No organomegaly or masses felt. Normal bowel sounds heard. Central nervous system: Alert and oriented. No focal  neurological deficits. Extremities: No cyanosis or clubbing. Skin: No petechiae or rashes appreciated on exam. Psychiatry: Judgement and insight appear normal. Mood & affect appropriate.    Data Reviewed: I have personally reviewed following labs and imaging studies  CBC: Recent Labs  Lab 11/29/19 1142 11/30/19 0623 12/01/19 0707 12/02/19 0701  WBC 24.1* 20.8* 16.7* 14.2*  NEUTROABS 21.0*  --   --   --   HGB 11.6* 10.7* 10.5* 10.8*  HCT 37.9 34.7* 33.6* 35.0*  MCV 94.0 94.0 92.8 92.3  PLT 207 261 262 017   Basic Metabolic Panel: Recent Labs  Lab 11/29/19 1142 11/30/19 0623 12/01/19 0707 12/02/19 0701  NA 137 139 137 136  K 3.6 3.0* 4.0 4.1  CL 102 105 105 103  CO2 24 26 22 23   GLUCOSE 300* 120* 164* 191*  BUN 16 14 13 16   CREATININE 1.06* 0.87 0.93 1.10*  CALCIUM 8.6* 8.6* 8.5* 8.4*  MG 1.8  --  1.6* 1.6*   GFR: Estimated Creatinine Clearance: 33.2 mL/min (A) (by C-G formula based on SCr of 1.1 mg/dL (H)).    Liver Function Tests: Recent Labs  Lab 11/29/19 1142  AST 11*  ALT 18  ALKPHOS 117  BILITOT 1.5*  PROT 6.6  ALBUMIN 3.0*   Coagulation Profile: Recent Labs  Lab 11/29/19 1142  INR 1.6*   CBG: Recent Labs  Lab 12/01/19 1632 12/01/19 2116 12/02/19 0750 12/02/19 1105 12/02/19 1643  GLUCAP 227* 198* 180* 191* 159*   Sepsis Labs: Recent Labs  Lab 11/29/19 1142 11/29/19 1313 11/30/19 0623 12/01/19 0707  PROCALCITON 0.16  --  0.27 0.16  LATICACIDVEN 1.5 1.3  --   --     Recent Results (from the past 240 hour(s))  Blood Culture (routine x 2)     Status: Abnormal   Collection Time: 11/29/19 11:43 AM   Specimen: BLOOD LEFT ARM  Result Value Ref Range Status   Specimen Description   Final    BLOOD LEFT ARM Performed at [facility], [address], [place], [place] [address]    Special Requests   Final    BOTTLES DRAWN AEROBIC AND ANAEROBIC Blood Culture results may not be optimal due to an inadequate volume of blood received in  culture bottles Performed at [facility], [address], [place], [place] [address]    Culture  Setup Time   Final    GRAM POSITIVE COCCI Gram Stain Report Called to,Read Back By and Verified With: BENSON,D AT 3300 ON 11/30/19 BY HUFFINES,S.  AEROBIC BOTTLE ONLY Performed at [facility], [address], [place], [place] [address]    Culture (A)  Final    STAPHYLOCOCCUS SPECIES (COAGULASE NEGATIVE) THE SIGNIFICANCE OF ISOLATING THIS ORGANISM FROM A SINGLE SET OF BLOOD CULTURES WHEN MULTIPLE SETS ARE DRAWN IS UNCERTAIN. PLEASE NOTIFY THE MICROBIOLOGY DEPARTMENT WITHIN ONE WEEK IF SPECIATION AND SENSITIVITIES ARE REQUIRED. Performed at [facility], [place] [address], [place],  [address]    Report Status 12/02/2019 FINAL  Final  Blood Culture (routine x 2)     Status: None (Preliminary result)   Collection Time:  11/29/19  1:13 PM   Specimen: BLOOD LEFT ARM  Result Value Ref Range Status   Specimen Description BLOOD LEFT ARM  Final   Special Requests   Final    BOTTLES DRAWN AEROBIC AND ANAEROBIC Blood Culture adequate volume   Culture   Final    NO GROWTH 3 DAYS Performed at [facility], [address] [place], [place] [address]    Report Status PENDING  Incomplete  Urine culture     Status: Abnormal   Collection Time: 11/29/19  2:28 PM   Specimen: Urine, Catheterized  Result Value Ref Range Status   Specimen Description   Final    URINE, CATHETERIZED Performed at [facility], [address] [place], [place] [address]    Special Requests   Final    NONE Performed at [facility], [address], [place], [place] [address]    Culture MULTIPLE SPECIES PRESENT, SUGGEST RECOLLECTION (A)  Final   Report Status 11/30/2019 FINAL  Final  SARS CORONAVIRUS 2 (TAT 6-24 HRS) Nasopharyngeal Nasopharyngeal Swab     Status: None   Collection Time: 11/29/19  2:38 PM   Specimen: Nasopharyngeal Swab  Result Value Ref Range Status   SARS Coronavirus 2 NEGATIVE NEGATIVE Final     Comment: (NOTE) SARS-CoV-2 target nucleic acids are NOT DETECTED. The SARS-CoV-2 RNA is generally detectable in upper and lower respiratory specimens during the acute phase of infection. Negative results do not preclude SARS-CoV-2 infection, do not rule out co-infections with other pathogens, and should not be used as the sole basis for treatment or other patient management decisions. Negative results must be combined with clinical observations, patient history, and epidemiological information. The expected result is Negative. Fact Sheet for Patients: SugarRoll.be Fact Sheet for Healthcare Providers: https://www.woods-mathews.com/ This test is not yet approved or cleared by the [place] FDA and  has been authorized for detection and/or diagnosis of SARS-CoV-2 by FDA under an Emergency Use Authorization (EUA). This EUA will remain  in effect (meaning this test can be used) for the duration of the COVID-19 declaration under Section 56 4(b)(1) of the Act, 21 U.S.C. section 360bbb-3(b)(1), unless the authorization is terminated or revoked sooner. Performed at [facility], [place] [address], [place], [place] [address]   Respiratory Panel by RT PCR (Flu A&B, Covid) - Nasopharyngeal Swab     Status: None   Collection Time: 12/02/19  3:02 PM   Specimen: Nasopharyngeal Swab  Result Value Ref Range Status   SARS Coronavirus 2 by RT PCR NEGATIVE NEGATIVE Final    Comment: (NOTE) SARS-CoV-2 target nucleic acids are NOT DETECTED. The SARS-CoV-2 RNA is generally detectable in upper respiratoy specimens during the acute phase of infection. The lowest concentration of SARS-CoV-2 viral copies this assay can detect is 131 copies/mL. A negative result does not preclude SARS-Cov-2 infection and should not be used as the sole basis for treatment or other patient management decisions. A negative result may occur with  improper specimen  collection/handling, submission of specimen other than nasopharyngeal swab, presence of viral mutation(s) within the areas targeted by this assay, and inadequate number of viral copies (<131 copies/mL). A negative result must be combined with clinical observations, patient history, and epidemiological information. The expected result is Negative. Fact Sheet for Patients:  PinkCheek.be Fact Sheet for Healthcare Providers:  GravelBags.it This test is not yet ap proved or cleared by the [place] FDA and  has been authorized for detection and/or diagnosis of SARS-CoV-2 by FDA under  an Emergency Use Authorization (EUA). This EUA will remain  in effect (meaning this test can be used) for the duration of the COVID-19 declaration under Section 564(b)(1) of the Act, 21 U.S.C. section 360bbb-3(b)(1), unless the authorization is terminated or revoked sooner.    Influenza A by PCR NEGATIVE NEGATIVE Final   Influenza B by PCR NEGATIVE NEGATIVE Final    Comment: (NOTE) The Xpert Xpress SARS-CoV-2/FLU/RSV assay is intended as an aid in  the diagnosis of influenza from Nasopharyngeal swab specimens and  should not be used as a sole basis for treatment. Nasal washings and  aspirates are unacceptable for Xpert Xpress SARS-CoV-2/FLU/RSV  testing. Fact Sheet for Patients: PinkCheek.be Fact Sheet for Healthcare Providers: GravelBags.it This test is not yet approved or cleared by the [place] FDA and  has been authorized for detection and/or diagnosis of SARS-CoV-2 by  FDA under an Emergency Use Authorization (EUA). This EUA will remain  in effect (meaning this test can be used) for the duration of the  Covid-19 declaration under Section 564(b)(1) of the Act, 21  U.S.C. section 360bbb-3(b)(1), unless the authorization is  terminated or revoked. Performed at [facility],  [address] [place], [place] [address]      Radiology Studies: No results found.   Scheduled Meds: . apixaban  5 mg Oral BID  . azithromycin  500 mg Oral Daily  . carvedilol  25 mg Oral BID WC  . DULoxetine  60 mg Oral BID  . insulin aspart  0-9 Units Subcutaneous TID WC  . saccharomyces boulardii  250 mg Oral Daily  . simvastatin  20 mg Oral Daily   Continuous Infusions: . ceFEPime (MAXIPIME) IV 2 g (12/02/19 0830)     LOS: 3 days    Time spent: 30 minutes    Barton Dubois, MD [facility] [phone]  12/02/2019, 4:48 PM

## 2019-12-02 NOTE — Clinical Social Work Note (Signed)
Norma [name] still has patient's authorization pending under review.    Hildegard Hlavac, Clydene Pugh, LCSW

## 2019-12-03 ENCOUNTER — Inpatient Hospital Stay
Admission: RE | Admit: 2019-12-03 | Discharge: 2019-12-04 | Discharge status: Against Medical Advice | Payer: Medicare HMO | Source: Ambulatory Visit | Attending: Internal Medicine | Admitting: Internal Medicine

## 2019-12-03 DIAGNOSIS — E1165 Type 2 diabetes mellitus with hyperglycemia: Secondary | ICD-10-CM | POA: Diagnosis not present

## 2019-12-03 DIAGNOSIS — A419 Sepsis, unspecified organism: Secondary | ICD-10-CM | POA: Diagnosis not present

## 2019-12-03 DIAGNOSIS — Z7189 Other specified counseling: Secondary | ICD-10-CM | POA: Diagnosis not present

## 2019-12-03 DIAGNOSIS — I130 Hypertensive heart and chronic kidney disease with heart failure and stage 1 through stage 4 chronic kidney disease, or unspecified chronic kidney disease: Secondary | ICD-10-CM | POA: Diagnosis not present

## 2019-12-03 DIAGNOSIS — N1832 Chronic kidney disease, stage 3b: Secondary | ICD-10-CM | POA: Diagnosis not present

## 2019-12-03 DIAGNOSIS — I4819 Other persistent atrial fibrillation: Secondary | ICD-10-CM | POA: Diagnosis not present

## 2019-12-03 DIAGNOSIS — Z23 Encounter for immunization: Secondary | ICD-10-CM | POA: Diagnosis not present

## 2019-12-03 DIAGNOSIS — E785 Hyperlipidemia, unspecified: Secondary | ICD-10-CM | POA: Diagnosis not present

## 2019-12-03 DIAGNOSIS — J181 Lobar pneumonia, unspecified organism: Secondary | ICD-10-CM | POA: Diagnosis not present

## 2019-12-03 DIAGNOSIS — I4811 Longstanding persistent atrial fibrillation: Secondary | ICD-10-CM | POA: Diagnosis not present

## 2019-12-03 DIAGNOSIS — J189 Pneumonia, unspecified organism: Secondary | ICD-10-CM | POA: Diagnosis not present

## 2019-12-03 DIAGNOSIS — R4182 Altered mental status, unspecified: Secondary | ICD-10-CM | POA: Diagnosis not present

## 2019-12-03 DIAGNOSIS — D649 Anemia, unspecified: Secondary | ICD-10-CM | POA: Diagnosis not present

## 2019-12-03 DIAGNOSIS — I5042 Chronic combined systolic (congestive) and diastolic (congestive) heart failure: Secondary | ICD-10-CM | POA: Diagnosis not present

## 2019-12-03 LAB — GLUCOSE, CAPILLARY
Glucose-Capillary: 148 mg/dL — ABNORMAL HIGH (ref 70–99)
Glucose-Capillary: 198 mg/dL — ABNORMAL HIGH (ref 70–99)

## 2019-12-03 MED ORDER — CEFDINIR 300 MG PO CAPS: 300.0000 mg | ORAL_CAPSULE | Freq: Two times a day (BID) | ORAL | 0 refills | Status: AC

## 2019-12-03 MED ORDER — OXYCODONE-ACETAMINOPHEN 10-325 MG PO TABS: 1.0000 | ORAL_TABLET | Freq: Four times a day (QID) | ORAL | 0 refills | Status: AC | PRN

## 2019-12-03 NOTE — Progress Notes (Signed)
Report called to the [facility]. 

## 2019-12-03 NOTE — Discharge Summary (Signed)
Physician Discharge Summary  JASMON MATTICE YKZ:[id number] DOB: June 22, 1934 DOA: 11/29/2019  PCP: Caryl Bis, MD  Admit date: 11/29/2019 Discharge date: 12/03/2019  Time spent: 35 minutes  Recommendations for Outpatient Follow-up:  1. Repeat CBC to assure complete resolution of elevated WBCs 2. Repeat basic metabolic panel to follow lites renal function 3. Repeat chest x-ray in 6 weeks to assure resolution of infiltrate 4. Continue to follow CBGs with further adjustment to hypoglycemic regimen as required 5. Reassess blood pressure and adjust antihypertensive regimen as needed.   Discharge Diagnoses:  Active Problems:   Chronic kidney disease (CKD), stage III (moderate) (HCC)   Uncontrolled type 2 diabetes mellitus with hyperglycemia, with long-term current use of insulin (HCC)   Persistent atrial fibrillation (HCC)   Sepsis due to undetermined organism (HCC)   Chronic combined systolic and diastolic congestive heart failure (HCC)   Lobar pneumonia (HCC)   Multifocal pneumonia   Goals of care, counseling/discussion   Palliative care by specialist   DNR (do not resuscitate) discussion   Altered mental status   Discharge Condition: Stable and improved.  Discharged to skilled nursing facility for further care and rehabilitation.  Anticipated plan is for patient to be followed by palliative care once she returns home to continue delineating goals of care and future treatments.  Code status: DNR  Diet recommendation: Heart healthy diet.  Filed Weights   11/29/19 1043 11/29/19 1719 12/03/19 0449  Weight: 69.4 kg 60.6 kg 61 kg    History of present illness:  As per H&P written by Dr. Carles Collet on 11/29/2019 86 y.o. female with medical history of persistent atrial fibrillation, systolic and diastolic CHF, diabetes mellitus type 2, CKD stage III, chronic pain syndrome, hypertension, severe presenting with 2 to 3-day history of confusion believing that her husband was trying to kill her  by giving her medications.  History is supplemented by the patient's grandson at the bedside.  Apparently, the patient has had some coughing and shortness of breath for the past 2 days.  There is been no fevers, chills, hemoptysis, nausea, vomiting, diarrhea, dysuria, hematuria, hematochezia, melena.  The patient herself is a poor historian secondary to her cognitive impairment.  However she was able to answer simple questions.  She does endorse some abdominal pain although she is not able to tell me the duration no alleviating or exacerbating factors.  The patient is essentially bedbound and requires assistance with all her activities of daily living. In the emergency department, patient had low-grade temperature of 99.1 F with not tachycardia in the 100s.  She was hemodynamically stable with oxygen saturation 95% on room air.  BMP was unremarkable as well as LFTs.  WBC was 24.1 with hemoglobin 11.6 and platelets 287,000.  CT of the brain was negative.  Chest x-ray showed bilateral patchy airspace opacities with small right pleural effusion.  Lactic acid was 1.5>>> 1.3.  UA shows 21-50 WBC.  The patient was started on cefepime and azithromycin.  Hospital Course:  Sepsis likely secondary to multilobar pneumonia as well as possible UTI-improving/resolving as expected. -Patient treated with IV cefepime for 3 days prior to discharge. -Discharged on oral cefdinir to complete antibiotic therapy. -1 set of blood cultures with contaminant, staph coagulase-negative -Urine culture with multiple species, not helping with antibiotic selection. -Leukocytosis improving appropriately -Continue supportive care and follow clinical response.  Physical deconditioning/acute metabolic encephalopathy secondary to above -improving and essentially at baseline -Patient with underlying cognitive impairment -Appreciate palliative consultation with patient now DNR -PT  evaluation for SNF and patient/family are in  agreement. -Repeat Covid test negative and patient authorized by insurance to go to the [facility] for further care and rehabilitation.  Hypoglycemia in setting of poorly controlled type 2 diabetes -stable now -Most likely in the setting of acute infection along with poor oral intake. -Hemoglobin A1c 9.4% -Continue home hypoglycemic regimen at discharge; follow CBGs and further adjust treatment as required.  Hypomagnesemia -Repleted and within normal limits currently. -Advised to maintain adequate nutrition and oral intake.  Persistent atrial fibrillation -Continue Coreg and apixaban  -No signs of overt bleeding. -Rate controlled and stable.  Abdominal pain -No acute findings on CT abdomen and pelvis -Monitor for BM and provide laxation as needed (as patient is chronically on narcotics).  Chronic systolic and diastolic CHF -Monitor daily weights and and follow heart healthy/low-sodium diet. -2D echocardiogram 04/2019 with LVEF 45-50% and mild to moderate TR, mild MR -Continue beta-blocker.  CKD stage IIIb -Baseline creatinine 1.1-1.4 -Creatinine currently 0.9-1.10 -Continue to maintain adequate hydration and follow basic metabolic panels intermittently to follow electrolytes trend and renal function and stability..  Chronic pain syndrome -Continue as needed Percocet  Depression -Continue Cymbalta and Wellbutrin -Mood is overall stable -Patient denies suicidal ideation or hallucinations.  Dyslipidemia -Continue statin  Procedures:  See below for x-ray reports.  Consultations:  Palliative care service  Discharge Exam: Vitals:   12/03/19 0449 12/03/19 1339  BP: (!) 160/95 110/90  Pulse: 93 (!) 106  Resp:  18  Temp: 99.2 F (37.3 C) 99 F (37.2 C)  SpO2: 99% 98%    General: Afebrile, awake and cooperative with examination; patient was able to follow commands. Denies chest pain, still complaining of intermittent coughing spells.  No fever.  No  nausea, no vomiting. Cardiovascular: Rate controlled, no rubs, no gallops, no JVD on exam. Respiratory: Scattered rhonchi bilaterally; no wheezing, no using accessory muscles.  Good O2 sat on room air. Abdomen: Soft, nontender, nondistended, positive bowel sounds. Extremities: No cyanosis or clubbing.  Discharge Instructions   Discharge Instructions    (HEART FAILURE PATIENTS) Call MD:  Anytime you have any of the following symptoms: 1) 3 pound weight gain in 24 hours or 5 pounds in 1 week 2) shortness of breath, with or without a dry hacking cough 3) swelling in the hands, feet or stomach 4) if you have to sleep on extra pillows at night in order to breathe.   Complete by: As directed    Diet - low sodium heart healthy   Complete by: As directed    Discharge instructions   Complete by: As directed    Further care, conditioning and rehabilitation as per the skilled nursing facility protocol Follow heart healthy diet Complete 4 more days of antibiotics as prescribed Maintain adequate hydration.     Allergies as of 12/03/2019   No Known Allergies     Medication List    STOP taking these medications   Cranberry 250 MG Caps     TAKE these medications   apixaban 5 MG Tabs tablet Commonly known as: ELIQUIS Take 1 tablet (5 mg total) by mouth 2 (two) times daily.   buPROPion 300 MG 24 hr tablet Commonly known as: WELLBUTRIN XL   carvedilol 25 MG tablet Commonly known as: COREG Take 1 tablet (25 mg total) by mouth 2 (two) times daily with a meal.   cefdinir 300 MG capsule Commonly known as: OMNICEF Take 1 capsule (300 mg total) by mouth 2 (two) times daily  for 4 days.   dextromethorphan-guaiFENesin 30-600 MG 12hr tablet Commonly known as: MUCINEX DM Take 1 tablet by mouth 2 (two) times daily as needed for cough.   Diaper Rash 10 % Crea Generic drug: ZINC OXIDE (TOPICAL) Apply 1 application topically daily as needed.   DULoxetine 60 MG capsule Commonly known as:  CYMBALTA Take 60 mg by mouth 2 (two) times daily.   glipiZIDE 2.5 MG 24 hr tablet Commonly known as: GLUCOTROL XL Take 2.5 mg by mouth daily with breakfast.   insulin detemir 100 UNIT/ML injection Commonly known as: LEVEMIR Inject 0.1 mLs (10 Units total) into the skin at bedtime. What changed: how much to take   oxyCODONE-acetaminophen 10-325 MG tablet Commonly known as: PERCOCET Take 1 tablet by mouth 4 (four) times daily as needed for pain.   saccharomyces boulardii 250 MG capsule Commonly known as: Florastor Take 1 capsule (250 mg total) 2 (two) times daily by mouth. What changed: when to take this   simvastatin 20 MG tablet Commonly known as: ZOCOR Take 20 mg by mouth daily.      No Known Allergies  Contact information for follow-up providers    Caryl Bis, MD. Schedule an appointment as soon as possible for a visit in 10 day(s).   Specialty: Family Medicine Why: After discharge from skilled nursing facility. Contact information: [place] [address] [phone]        Sueanne Margarita, MD .   Specialty: Cardiology Contact information: [phone] [address] [place] [address] [phone]            Contact information for after-discharge care    [facility] .   Service: Skilled Nursing Contact information: [address] [place] [phone]                  The results of significant diagnostics from this hospitalization (including imaging, microbiology, ancillary and laboratory) are listed below for reference.    Significant Diagnostic Studies: CT ABDOMEN PELVIS WO CONTRAST  Result Date: 11/29/2019 CLINICAL DATA:  Coughing and confusionabdominal distension abdominal pain, sepsis, fever EXAM: CT ABDOMEN AND PELVIS WITHOUT CONTRAST TECHNIQUE: Multidetector CT imaging of the abdomen and pelvis was performed following the standard protocol without  IV contrast. COMPARISON:  CT abdomen 06/29/2019 FINDINGS: Lower chest: Increased angular consolidation at the RIGHT lung base with several discrete foci of nodularity. Findings concerning for pneumonia or aspiration pneumonitis. Hepatobiliary: No focal hepatic lesion on noncontrast exam. Postcholecystectomy. Pancreas: Pancreas is fatty replaced Spleen: Normal spleen Adrenals/urinary tract: Adrenal glands and kidneys are normal. The ureters and bladder normal. Stomach/Bowel: Stomach, small-bowel and cecum normal. Post appendectomy. The colon and rectosigmoid colon are normal. Vascular/Lymphatic: Abdominal aorta is normal caliber with atherosclerotic calcification. There is no retroperitoneal or periportal lymphadenopathy. No pelvic lymphadenopathy. Reproductive: Post hysterectomy Other: No free fluid. Musculoskeletal: Degenerative changes of the lumbar spine IMPRESSION: 1. Increased angular consolidation and nodularity at the RIGHT lower lobe and RIGHT middle lobe concerning for pneumonia versus aspiration pneumonitis. 2. No acute findings in the abdomen or pelvis. 3. Extensive vascular calcification of aorta and coronary arteries. 4. Post cholecystectomy and appendectomy. Electronically Signed   By: Suzy Bouchard M.D.   On: 11/29/2019 19:34   CT Head Wo Contrast  Result Date: 11/29/2019 CLINICAL DATA:  Encephalopathy EXAM: CT HEAD WITHOUT CONTRAST TECHNIQUE: Contiguous axial images were obtained from the base of the skull through the  vertex without intravenous contrast. COMPARISON:  06/29/2019 FINDINGS: Brain: There is no acute intracranial hemorrhage, mass effect, or edema. No new loss of gray-white differentiation. There is no extra-axial fluid collection. Ventricles and sulci are stable in size and configuration. Patchy and confluent areas of hypoattenuation in the supratentorial white matter are nonspecific but probably reflect stable microvascular ischemic changes. There are chronic small vessel infarcts  the basal ganglia and adjacent white matter bilaterally. Vascular: There is atherosclerotic calcification at the skull base. Skull: Small foci lucency within the calvarium been present on prior studies. Sinuses/Orbits: Partially imaged extensive paranasal sinus inflammatory changes including a small left maxillary sinus reflect level. Orbits are unremarkable. Other: Patchy right mastoid opacification. IMPRESSION: No acute intracranial abnormality. Stable chronic findings detailed above. Electronically Signed   By: Macy Mis M.D.   On: 11/29/2019 14:12   US Venous Img Lower Unilateral Right (DVT)  Result Date: 11/29/2019 CLINICAL DATA:  Right leg swelling EXAM: RIGHT LOWER EXTREMITY VENOUS DOPPLER ULTRASOUND TECHNIQUE: Gray-scale sonography with compression, as well as color and duplex ultrasound, were performed to evaluate the deep venous system(s) from the level of the common femoral vein through the popliteal and proximal calf veins. COMPARISON:  None. FINDINGS: VENOUS Normal compressibility of the common femoral, superficial femoral, and popliteal veins, as well as the visualized calf veins. Visualized portions of profunda femoral vein and great saphenous vein unremarkable. No filling defects to suggest DVT on grayscale or color Doppler imaging. Doppler waveforms show normal direction of venous flow, normal respiratory phasicity and response to augmentation. Limited views of the contralateral common femoral vein are unremarkable. OTHER None. Limitations: none IMPRESSION: No evidence of right lower extremity DVT. Electronically Signed   By: Rolm Baptise M.D.   On: 11/29/2019 16:42   DG Chest Port 1 View  Result Date: 11/29/2019 CLINICAL DATA:  Cough, fever EXAM: PORTABLE CHEST 1 VIEW COMPARISON:  07/09/2019 FINDINGS: Patient is slightly rotated. Heart size is mildly enlarged, unchanged. Calcific aortic knob. Prominent patchy airspace opacities within the right lower lobe. Milder patchy airspace  opacities within the right upper lobe and left upper lobe. Possible small right pleural effusion. No pneumothorax. IMPRESSION: 1. Patchy bilateral airspace opacities, most confluent in the right lower lobe, suggestive of multifocal pneumonia. Radiographic follow-up to resolution is recommended. 2. Possible small right pleural effusion. Electronically Signed   By: Davina Poke D.O.   On: 11/29/2019 12:02    Microbiology: Recent Results (from the past 240 hour(s))  Blood Culture (routine x 2)     Status: Abnormal   Collection Time: 11/29/19 11:43 AM   Specimen: BLOOD LEFT ARM  Result Value Ref Range Status   Specimen Description   Final    BLOOD LEFT ARM Performed at [facility], [address] [place], [place] [address]    Special Requests   Final    BOTTLES DRAWN AEROBIC AND ANAEROBIC Blood Culture results may not be optimal due to an inadequate volume of blood received in culture bottles Performed at [address] [place], [place] [address]    Culture  Setup Time   Final    GRAM POSITIVE COCCI Gram Stain Report Called to,Read Back By and Verified With: BENSON,D AT 3329 ON 11/30/19 BY HUFFINES,S.  AEROBIC BOTTLE ONLY Performed at [facility], [address] [place], [place] [address]    Culture (A)  Final    STAPHYLOCOCCUS SPECIES (COAGULASE NEGATIVE) THE SIGNIFICANCE OF ISOLATING THIS ORGANISM FROM A SINGLE SET OF BLOOD CULTURES WHEN  MULTIPLE SETS ARE DRAWN IS UNCERTAIN. PLEASE NOTIFY THE MICROBIOLOGY DEPARTMENT WITHIN ONE WEEK IF SPECIATION AND SENSITIVITIES ARE REQUIRED. Performed at [facility], [place] [address], [place], [place] [address]    Report Status 12/02/2019 FINAL  Final  Blood Culture (routine x 2)     Status: None (Preliminary result)   Collection Time: 11/29/19  1:13 PM   Specimen: BLOOD LEFT ARM  Result Value Ref Range Status   Specimen Description BLOOD LEFT ARM  Final   Special Requests   Final    BOTTLES DRAWN AEROBIC AND ANAEROBIC Blood  Culture adequate volume   Culture   Final    NO GROWTH 4 DAYS Performed at [facility], [address] [place], [place] [address]    Report Status PENDING  Incomplete  Urine culture     Status: Abnormal   Collection Time: 11/29/19  2:28 PM   Specimen: Urine, Catheterized  Result Value Ref Range Status   Specimen Description   Final    URINE, CATHETERIZED Performed at [facility], [address] [place], [place] [address]    Special Requests   Final    NONE Performed at [facility], [address], [place], [place] [address]    Culture MULTIPLE SPECIES PRESENT, SUGGEST RECOLLECTION (A)  Final   Report Status 11/30/2019 FINAL  Final  SARS CORONAVIRUS 2 (TAT 6-24 HRS) Nasopharyngeal Nasopharyngeal Swab     Status: None   Collection Time: 11/29/19  2:38 PM   Specimen: Nasopharyngeal Swab  Result Value Ref Range Status   SARS Coronavirus 2 NEGATIVE NEGATIVE Final    Comment: (NOTE) SARS-CoV-2 target nucleic acids are NOT DETECTED. The SARS-CoV-2 RNA is generally detectable in upper and lower respiratory specimens during the acute phase of infection. Negative results do not preclude SARS-CoV-2 infection, do not rule out co-infections with other pathogens, and should not be used as the sole basis for treatment or other patient management decisions. Negative results must be combined with clinical observations, patient history, and epidemiological information. The expected result is Negative. Fact Sheet for Patients: SugarRoll.be Fact Sheet for Healthcare Providers: https://www.woods-mathews.com/ This test is not yet approved or cleared by the [place] FDA and  has been authorized for detection and/or diagnosis of SARS-CoV-2 by FDA under an Emergency Use Authorization (EUA). This EUA will remain  in effect (meaning this test can be used) for the duration of the COVID-19 declaration under Section 56 4(b)(1) of the Act, 21 U.S.C. section  360bbb-3(b)(1), unless the authorization is terminated or revoked sooner. Performed at [facility], [place] [address] [place], [place] [address]   Respiratory Panel by RT PCR (Flu A&B, Covid) - Nasopharyngeal Swab     Status: None   Collection Time: 12/02/19  3:02 PM   Specimen: Nasopharyngeal Swab  Result Value Ref Range Status   SARS Coronavirus 2 by RT PCR NEGATIVE NEGATIVE Final    Comment: (NOTE) SARS-CoV-2 target nucleic acids are NOT DETECTED. The SARS-CoV-2 RNA is generally detectable in upper respiratoy specimens during the acute phase of infection. The lowest concentration of SARS-CoV-2 viral copies this assay can detect is 131 copies/mL. A negative result does not preclude SARS-Cov-2 infection and should not be used as the sole basis for treatment or other patient management decisions. A negative result may occur with  improper specimen collection/handling, submission of specimen other than nasopharyngeal swab, presence of viral mutation(s) within the areas targeted by this assay, and inadequate number of viral copies (<131 copies/mL).  A negative result must be combined with clinical observations, patient history, and epidemiological information. The expected result is Negative. Fact Sheet for Patients:  PinkCheek.be Fact Sheet for Healthcare Providers:  GravelBags.it This test is not yet ap proved or cleared by the [place] FDA and  has been authorized for detection and/or diagnosis of SARS-CoV-2 by FDA under an Emergency Use Authorization (EUA). This EUA will remain  in effect (meaning this test can be used) for the duration of the COVID-19 declaration under Section 564(b)(1) of the Act, 21 U.S.C. section 360bbb-3(b)(1), unless the authorization is terminated or revoked sooner.    Influenza A by PCR NEGATIVE NEGATIVE Final   Influenza B by PCR NEGATIVE NEGATIVE Final    Comment: (NOTE) The Xpert  Xpress SARS-CoV-2/FLU/RSV assay is intended as an aid in  the diagnosis of influenza from Nasopharyngeal swab specimens and  should not be used as a sole basis for treatment. Nasal washings and  aspirates are unacceptable for Xpert Xpress SARS-CoV-2/FLU/RSV  testing. Fact Sheet for Patients: PinkCheek.be Fact Sheet for Healthcare Providers: GravelBags.it This test is not yet approved or cleared by the [place] FDA and  has been authorized for detection and/or diagnosis of SARS-CoV-2 by  FDA under an Emergency Use Authorization (EUA). This EUA will remain  in effect (meaning this test can be used) for the duration of the  Covid-19 declaration under Section 564(b)(1) of the Act, 21  U.S.C. section 360bbb-3(b)(1), unless the authorization is  terminated or revoked. Performed at [facility], [address] [place], [place] [address]      Labs: Basic Metabolic Panel: Recent Labs  Lab 11/29/19 1142 11/30/19 0623 12/01/19 0707 12/02/19 0701  NA 137 139 137 136  K 3.6 3.0* 4.0 4.1  CL 102 105 105 103  CO2 24 26 22 23   GLUCOSE 300* 120* 164* 191*  BUN 16 14 13 16   CREATININE 1.06* 0.87 0.93 1.10*  CALCIUM 8.6* 8.6* 8.5* 8.4*  MG 1.8  --  1.6* 1.6*   Liver Function Tests: Recent Labs  Lab 11/29/19 1142  AST 11*  ALT 18  ALKPHOS 117  BILITOT 1.5*  PROT 6.6  ALBUMIN 3.0*   CBC: Recent Labs  Lab 11/29/19 1142 11/30/19 0623 12/01/19 0707 12/02/19 0701  WBC 24.1* 20.8* 16.7* 14.2*  NEUTROABS 21.0*  --   --   --   HGB 11.6* 10.7* 10.5* 10.8*  HCT 37.9 34.7* 33.6* 35.0*  MCV 94.0 94.0 92.8 92.3  PLT 207 261 262 256    BNP (last 3 results) Recent Labs    11/29/19 1142  BNP 452.0*    CBG: Recent Labs  Lab 12/02/19 1105 12/02/19 1643 12/02/19 2135 12/03/19 0745 12/03/19 1113  GLUCAP 191* 159* 146* 148* 198*    Signed:  Barton Dubois MD.  [facility] 12/03/2019, 1:45 PM

## 2019-12-03 NOTE — Progress Notes (Signed)
Patient transferred to [facility] accompanied by their staff

## 2019-12-03 NOTE — TOC Transition Note (Signed)
Transition of Care [facility]) - CM/SW Discharge Note   Patient Details  Name: BRYNJA MARKER MRN: [id number] Date of Birth: Oct 15, 1933  [facility]) CM/SW Contact:  Ihor Gully, LCSW Phone Number: 12/03/2019, 2:42 PM   Clinical Narrative:    Discharge clinicals sent to facility.    Final next level of care: [place] Barriers to Discharge: No Barriers Identified   Patient Goals and CMS Choice        Discharge Placement              Patient chooses bed at: [facility] Patient to be transferred to facility by: Staff Name of family member notified: Message left for granddaughter, Elmyra Ricks Patient and family notified of of transfer: 12/03/19  Discharge Plan and Services                                     Social Determinants of Health (SDOH) Interventions     Readmission Risk Interventions No flowsheet data found.

## 2019-12-04 LAB — CULTURE, BLOOD (ROUTINE X 2)
Culture: NO GROWTH
Special Requests: ADEQUATE

## 2019-12-05 DIAGNOSIS — R531 Weakness: Secondary | ICD-10-CM | POA: Diagnosis not present

## 2019-12-05 DIAGNOSIS — Z7401 Bed confinement status: Secondary | ICD-10-CM | POA: Diagnosis not present

## 2019-12-05 DIAGNOSIS — Z7901 Long term (current) use of anticoagulants: Secondary | ICD-10-CM | POA: Diagnosis not present

## 2019-12-05 DIAGNOSIS — E119 Type 2 diabetes mellitus without complications: Secondary | ICD-10-CM | POA: Diagnosis not present

## 2019-12-05 DIAGNOSIS — F329 Major depressive disorder, single episode, unspecified: Secondary | ICD-10-CM | POA: Diagnosis not present

## 2019-12-05 DIAGNOSIS — J189 Pneumonia, unspecified organism: Secondary | ICD-10-CM | POA: Diagnosis not present

## 2019-12-05 DIAGNOSIS — R410 Disorientation, unspecified: Secondary | ICD-10-CM | POA: Diagnosis not present

## 2019-12-05 DIAGNOSIS — E876 Hypokalemia: Secondary | ICD-10-CM | POA: Diagnosis not present

## 2019-12-05 DIAGNOSIS — G459 Transient cerebral ischemic attack, unspecified: Secondary | ICD-10-CM | POA: Diagnosis not present

## 2019-12-05 DIAGNOSIS — R000 Tachycardia, unspecified: Secondary | ICD-10-CM | POA: Diagnosis not present

## 2019-12-05 DIAGNOSIS — Z20822 Contact with and (suspected) exposure to covid-19: Secondary | ICD-10-CM | POA: Diagnosis not present

## 2019-12-05 DIAGNOSIS — K219 Gastro-esophageal reflux disease without esophagitis: Secondary | ICD-10-CM | POA: Diagnosis not present

## 2019-12-05 DIAGNOSIS — N3000 Acute cystitis without hematuria: Secondary | ICD-10-CM | POA: Diagnosis not present

## 2019-12-05 DIAGNOSIS — R29810 Facial weakness: Secondary | ICD-10-CM | POA: Diagnosis not present

## 2019-12-05 DIAGNOSIS — N3090 Cystitis, unspecified without hematuria: Secondary | ICD-10-CM | POA: Diagnosis not present

## 2019-12-05 DIAGNOSIS — I10 Essential (primary) hypertension: Secondary | ICD-10-CM | POA: Diagnosis not present

## 2019-12-05 DIAGNOSIS — R4182 Altered mental status, unspecified: Secondary | ICD-10-CM | POA: Diagnosis not present

## 2019-12-05 DIAGNOSIS — G8929 Other chronic pain: Secondary | ICD-10-CM | POA: Diagnosis not present

## 2019-12-05 DIAGNOSIS — J984 Other disorders of lung: Secondary | ICD-10-CM | POA: Diagnosis not present

## 2019-12-12 DIAGNOSIS — R4182 Altered mental status, unspecified: Secondary | ICD-10-CM | POA: Diagnosis not present

## 2019-12-14 DIAGNOSIS — I10 Essential (primary) hypertension: Secondary | ICD-10-CM | POA: Diagnosis not present

## 2019-12-14 DIAGNOSIS — E7849 Other hyperlipidemia: Secondary | ICD-10-CM | POA: Diagnosis not present

## 2020-01-02 DIAGNOSIS — A419 Sepsis, unspecified organism: Secondary | ICD-10-CM | POA: Diagnosis not present

## 2020-01-02 DIAGNOSIS — J189 Pneumonia, unspecified organism: Secondary | ICD-10-CM | POA: Diagnosis not present

## 2020-01-11 DIAGNOSIS — R319 Hematuria, unspecified: Secondary | ICD-10-CM | POA: Diagnosis not present

## 2020-01-11 DIAGNOSIS — R8290 Unspecified abnormal findings in urine: Secondary | ICD-10-CM | POA: Diagnosis not present

## 2020-01-12 DIAGNOSIS — I482 Chronic atrial fibrillation, unspecified: Secondary | ICD-10-CM | POA: Diagnosis not present

## 2020-01-12 DIAGNOSIS — I5032 Chronic diastolic (congestive) heart failure: Secondary | ICD-10-CM | POA: Diagnosis not present

## 2020-01-12 DIAGNOSIS — I10 Essential (primary) hypertension: Secondary | ICD-10-CM | POA: Diagnosis not present

## 2020-01-13 DIAGNOSIS — I482 Chronic atrial fibrillation, unspecified: Secondary | ICD-10-CM | POA: Diagnosis not present

## 2020-01-13 DIAGNOSIS — I5032 Chronic diastolic (congestive) heart failure: Secondary | ICD-10-CM | POA: Diagnosis not present

## 2020-01-13 DIAGNOSIS — I10 Essential (primary) hypertension: Secondary | ICD-10-CM | POA: Diagnosis not present

## 2020-01-14 DIAGNOSIS — Z20822 Contact with and (suspected) exposure to covid-19: Secondary | ICD-10-CM | POA: Diagnosis not present

## 2020-01-14 DIAGNOSIS — R4182 Altered mental status, unspecified: Secondary | ICD-10-CM | POA: Diagnosis not present

## 2020-01-14 DIAGNOSIS — Z7901 Long term (current) use of anticoagulants: Secondary | ICD-10-CM | POA: Diagnosis not present

## 2020-01-14 DIAGNOSIS — Z79899 Other long term (current) drug therapy: Secondary | ICD-10-CM | POA: Diagnosis not present

## 2020-01-14 DIAGNOSIS — I10 Essential (primary) hypertension: Secondary | ICD-10-CM | POA: Diagnosis not present

## 2020-01-14 DIAGNOSIS — Z7902 Long term (current) use of antithrombotics/antiplatelets: Secondary | ICD-10-CM | POA: Diagnosis not present

## 2020-01-14 DIAGNOSIS — Z794 Long term (current) use of insulin: Secondary | ICD-10-CM | POA: Diagnosis not present

## 2020-01-14 DIAGNOSIS — Z8673 Personal history of transient ischemic attack (TIA), and cerebral infarction without residual deficits: Secondary | ICD-10-CM | POA: Diagnosis not present

## 2020-01-14 DIAGNOSIS — G319 Degenerative disease of nervous system, unspecified: Secondary | ICD-10-CM | POA: Diagnosis not present

## 2020-01-14 DIAGNOSIS — R404 Transient alteration of awareness: Secondary | ICD-10-CM | POA: Diagnosis not present

## 2020-01-14 DIAGNOSIS — R0689 Other abnormalities of breathing: Secondary | ICD-10-CM | POA: Diagnosis not present

## 2020-01-14 DIAGNOSIS — E119 Type 2 diabetes mellitus without complications: Secondary | ICD-10-CM | POA: Diagnosis not present

## 2020-01-14 DIAGNOSIS — R569 Unspecified convulsions: Secondary | ICD-10-CM | POA: Diagnosis not present

## 2020-01-14 DIAGNOSIS — I517 Cardiomegaly: Secondary | ICD-10-CM | POA: Diagnosis not present

## 2020-01-14 DIAGNOSIS — I700 Atherosclerosis of aorta: Secondary | ICD-10-CM | POA: Diagnosis not present

## 2020-01-14 DIAGNOSIS — J9811 Atelectasis: Secondary | ICD-10-CM | POA: Diagnosis not present

## 2020-01-14 DIAGNOSIS — N390 Urinary tract infection, site not specified: Secondary | ICD-10-CM | POA: Diagnosis not present

## 2020-01-15 DIAGNOSIS — R2689 Other abnormalities of gait and mobility: Secondary | ICD-10-CM | POA: Diagnosis not present

## 2020-01-15 DIAGNOSIS — I4819 Other persistent atrial fibrillation: Secondary | ICD-10-CM | POA: Diagnosis not present

## 2020-01-15 DIAGNOSIS — I5042 Chronic combined systolic (congestive) and diastolic (congestive) heart failure: Secondary | ICD-10-CM | POA: Diagnosis not present

## 2020-01-15 DIAGNOSIS — G8929 Other chronic pain: Secondary | ICD-10-CM | POA: Diagnosis not present

## 2020-01-15 DIAGNOSIS — G319 Degenerative disease of nervous system, unspecified: Secondary | ICD-10-CM | POA: Diagnosis not present

## 2020-01-15 DIAGNOSIS — E1122 Type 2 diabetes mellitus with diabetic chronic kidney disease: Secondary | ICD-10-CM | POA: Diagnosis not present

## 2020-01-15 DIAGNOSIS — B348 Other viral infections of unspecified site: Secondary | ICD-10-CM | POA: Diagnosis not present

## 2020-01-15 DIAGNOSIS — R8281 Pyuria: Secondary | ICD-10-CM | POA: Diagnosis not present

## 2020-01-15 DIAGNOSIS — J9811 Atelectasis: Secondary | ICD-10-CM | POA: Diagnosis not present

## 2020-01-15 DIAGNOSIS — I480 Paroxysmal atrial fibrillation: Secondary | ICD-10-CM | POA: Diagnosis not present

## 2020-01-15 DIAGNOSIS — Z8673 Personal history of transient ischemic attack (TIA), and cerebral infarction without residual deficits: Secondary | ICD-10-CM | POA: Diagnosis not present

## 2020-01-15 DIAGNOSIS — I517 Cardiomegaly: Secondary | ICD-10-CM | POA: Diagnosis not present

## 2020-01-15 DIAGNOSIS — R569 Unspecified convulsions: Secondary | ICD-10-CM | POA: Diagnosis not present

## 2020-01-15 DIAGNOSIS — N390 Urinary tract infection, site not specified: Secondary | ICD-10-CM | POA: Diagnosis not present

## 2020-01-15 DIAGNOSIS — G40409 Other generalized epilepsy and epileptic syndromes, not intractable, without status epilepticus: Secondary | ICD-10-CM | POA: Diagnosis not present

## 2020-01-15 DIAGNOSIS — I130 Hypertensive heart and chronic kidney disease with heart failure and stage 1 through stage 4 chronic kidney disease, or unspecified chronic kidney disease: Secondary | ICD-10-CM | POA: Diagnosis not present

## 2020-01-15 DIAGNOSIS — R4182 Altered mental status, unspecified: Secondary | ICD-10-CM | POA: Diagnosis not present

## 2020-01-15 DIAGNOSIS — I700 Atherosclerosis of aorta: Secondary | ICD-10-CM | POA: Diagnosis not present

## 2020-01-15 DIAGNOSIS — I69359 Hemiplegia and hemiparesis following cerebral infarction affecting unspecified side: Secondary | ICD-10-CM | POA: Diagnosis not present

## 2020-01-15 DIAGNOSIS — I428 Other cardiomyopathies: Secondary | ICD-10-CM | POA: Diagnosis not present

## 2020-01-15 DIAGNOSIS — Z9181 History of falling: Secondary | ICD-10-CM | POA: Diagnosis not present

## 2020-01-15 DIAGNOSIS — N183 Chronic kidney disease, stage 3 unspecified: Secondary | ICD-10-CM | POA: Diagnosis not present

## 2020-01-16 DIAGNOSIS — N390 Urinary tract infection, site not specified: Secondary | ICD-10-CM | POA: Diagnosis not present

## 2020-01-16 DIAGNOSIS — R0989 Other specified symptoms and signs involving the circulatory and respiratory systems: Secondary | ICD-10-CM | POA: Diagnosis not present

## 2020-01-16 DIAGNOSIS — I480 Paroxysmal atrial fibrillation: Secondary | ICD-10-CM | POA: Diagnosis not present

## 2020-01-16 DIAGNOSIS — N183 Chronic kidney disease, stage 3 unspecified: Secondary | ICD-10-CM | POA: Diagnosis not present

## 2020-01-16 DIAGNOSIS — I517 Cardiomegaly: Secondary | ICD-10-CM | POA: Diagnosis not present

## 2020-01-16 DIAGNOSIS — G40409 Other generalized epilepsy and epileptic syndromes, not intractable, without status epilepticus: Secondary | ICD-10-CM | POA: Diagnosis not present

## 2020-01-16 DIAGNOSIS — R531 Weakness: Secondary | ICD-10-CM | POA: Diagnosis not present

## 2020-01-16 DIAGNOSIS — R569 Unspecified convulsions: Secondary | ICD-10-CM | POA: Diagnosis not present

## 2020-01-16 DIAGNOSIS — E1122 Type 2 diabetes mellitus with diabetic chronic kidney disease: Secondary | ICD-10-CM | POA: Diagnosis not present

## 2020-01-16 DIAGNOSIS — B348 Other viral infections of unspecified site: Secondary | ICD-10-CM | POA: Diagnosis not present

## 2020-01-16 DIAGNOSIS — I428 Other cardiomyopathies: Secondary | ICD-10-CM | POA: Diagnosis not present

## 2020-01-16 DIAGNOSIS — I130 Hypertensive heart and chronic kidney disease with heart failure and stage 1 through stage 4 chronic kidney disease, or unspecified chronic kidney disease: Secondary | ICD-10-CM | POA: Diagnosis not present

## 2020-01-16 DIAGNOSIS — I5042 Chronic combined systolic (congestive) and diastolic (congestive) heart failure: Secondary | ICD-10-CM | POA: Diagnosis not present

## 2020-01-16 DIAGNOSIS — R2689 Other abnormalities of gait and mobility: Secondary | ICD-10-CM | POA: Diagnosis not present

## 2020-01-16 DIAGNOSIS — Z9181 History of falling: Secondary | ICD-10-CM | POA: Diagnosis not present

## 2020-01-16 DIAGNOSIS — N3000 Acute cystitis without hematuria: Secondary | ICD-10-CM | POA: Diagnosis not present

## 2020-01-17 DIAGNOSIS — Z9181 History of falling: Secondary | ICD-10-CM | POA: Diagnosis not present

## 2020-01-17 DIAGNOSIS — I130 Hypertensive heart and chronic kidney disease with heart failure and stage 1 through stage 4 chronic kidney disease, or unspecified chronic kidney disease: Secondary | ICD-10-CM | POA: Diagnosis not present

## 2020-01-17 DIAGNOSIS — I428 Other cardiomyopathies: Secondary | ICD-10-CM | POA: Diagnosis not present

## 2020-01-17 DIAGNOSIS — E785 Hyperlipidemia, unspecified: Secondary | ICD-10-CM | POA: Diagnosis not present

## 2020-01-17 DIAGNOSIS — I69359 Hemiplegia and hemiparesis following cerebral infarction affecting unspecified side: Secondary | ICD-10-CM | POA: Diagnosis not present

## 2020-01-17 DIAGNOSIS — F419 Anxiety disorder, unspecified: Secondary | ICD-10-CM | POA: Diagnosis not present

## 2020-01-17 DIAGNOSIS — B348 Other viral infections of unspecified site: Secondary | ICD-10-CM | POA: Diagnosis not present

## 2020-01-17 DIAGNOSIS — R569 Unspecified convulsions: Secondary | ICD-10-CM | POA: Diagnosis not present

## 2020-01-17 DIAGNOSIS — G40409 Other generalized epilepsy and epileptic syndromes, not intractable, without status epilepticus: Secondary | ICD-10-CM | POA: Diagnosis not present

## 2020-01-17 DIAGNOSIS — G8929 Other chronic pain: Secondary | ICD-10-CM | POA: Diagnosis not present

## 2020-01-17 DIAGNOSIS — E1122 Type 2 diabetes mellitus with diabetic chronic kidney disease: Secondary | ICD-10-CM | POA: Diagnosis not present

## 2020-01-17 DIAGNOSIS — I480 Paroxysmal atrial fibrillation: Secondary | ICD-10-CM | POA: Diagnosis not present

## 2020-01-17 DIAGNOSIS — N3000 Acute cystitis without hematuria: Secondary | ICD-10-CM | POA: Diagnosis not present

## 2020-01-17 DIAGNOSIS — I10 Essential (primary) hypertension: Secondary | ICD-10-CM | POA: Diagnosis not present

## 2020-01-17 DIAGNOSIS — I5042 Chronic combined systolic (congestive) and diastolic (congestive) heart failure: Secondary | ICD-10-CM | POA: Diagnosis not present

## 2020-01-17 DIAGNOSIS — N390 Urinary tract infection, site not specified: Secondary | ICD-10-CM | POA: Diagnosis not present

## 2020-01-17 DIAGNOSIS — N183 Chronic kidney disease, stage 3 unspecified: Secondary | ICD-10-CM | POA: Diagnosis not present

## 2020-01-17 DIAGNOSIS — R2689 Other abnormalities of gait and mobility: Secondary | ICD-10-CM | POA: Diagnosis not present

## 2020-01-18 DIAGNOSIS — I10 Essential (primary) hypertension: Secondary | ICD-10-CM | POA: Diagnosis not present

## 2020-01-18 DIAGNOSIS — E1122 Type 2 diabetes mellitus with diabetic chronic kidney disease: Secondary | ICD-10-CM | POA: Diagnosis not present

## 2020-01-18 DIAGNOSIS — N3000 Acute cystitis without hematuria: Secondary | ICD-10-CM | POA: Diagnosis not present

## 2020-01-18 DIAGNOSIS — N390 Urinary tract infection, site not specified: Secondary | ICD-10-CM | POA: Diagnosis not present

## 2020-01-18 DIAGNOSIS — Z9181 History of falling: Secondary | ICD-10-CM | POA: Diagnosis not present

## 2020-01-18 DIAGNOSIS — G40409 Other generalized epilepsy and epileptic syndromes, not intractable, without status epilepticus: Secondary | ICD-10-CM | POA: Diagnosis not present

## 2020-01-18 DIAGNOSIS — I5042 Chronic combined systolic (congestive) and diastolic (congestive) heart failure: Secondary | ICD-10-CM | POA: Diagnosis not present

## 2020-01-18 DIAGNOSIS — I130 Hypertensive heart and chronic kidney disease with heart failure and stage 1 through stage 4 chronic kidney disease, or unspecified chronic kidney disease: Secondary | ICD-10-CM | POA: Diagnosis not present

## 2020-01-18 DIAGNOSIS — N183 Chronic kidney disease, stage 3 unspecified: Secondary | ICD-10-CM | POA: Diagnosis not present

## 2020-01-18 DIAGNOSIS — R2689 Other abnormalities of gait and mobility: Secondary | ICD-10-CM | POA: Diagnosis not present

## 2020-01-18 DIAGNOSIS — B348 Other viral infections of unspecified site: Secondary | ICD-10-CM | POA: Diagnosis not present

## 2020-01-18 DIAGNOSIS — I69359 Hemiplegia and hemiparesis following cerebral infarction affecting unspecified side: Secondary | ICD-10-CM | POA: Diagnosis not present

## 2020-01-18 DIAGNOSIS — F419 Anxiety disorder, unspecified: Secondary | ICD-10-CM | POA: Diagnosis not present

## 2020-01-18 DIAGNOSIS — I480 Paroxysmal atrial fibrillation: Secondary | ICD-10-CM | POA: Diagnosis not present

## 2020-01-18 DIAGNOSIS — R569 Unspecified convulsions: Secondary | ICD-10-CM | POA: Diagnosis not present

## 2020-01-18 DIAGNOSIS — I428 Other cardiomyopathies: Secondary | ICD-10-CM | POA: Diagnosis not present

## 2020-01-20 DIAGNOSIS — N183 Chronic kidney disease, stage 3 unspecified: Secondary | ICD-10-CM | POA: Diagnosis not present

## 2020-01-20 DIAGNOSIS — I69351 Hemiplegia and hemiparesis following cerebral infarction affecting right dominant side: Secondary | ICD-10-CM | POA: Diagnosis not present

## 2020-01-20 DIAGNOSIS — I5042 Chronic combined systolic (congestive) and diastolic (congestive) heart failure: Secondary | ICD-10-CM | POA: Diagnosis not present

## 2020-01-20 DIAGNOSIS — E1122 Type 2 diabetes mellitus with diabetic chronic kidney disease: Secondary | ICD-10-CM | POA: Diagnosis not present

## 2020-01-20 DIAGNOSIS — I428 Other cardiomyopathies: Secondary | ICD-10-CM | POA: Diagnosis not present

## 2020-01-20 DIAGNOSIS — I10 Essential (primary) hypertension: Secondary | ICD-10-CM | POA: Diagnosis not present

## 2020-01-20 DIAGNOSIS — I130 Hypertensive heart and chronic kidney disease with heart failure and stage 1 through stage 4 chronic kidney disease, or unspecified chronic kidney disease: Secondary | ICD-10-CM | POA: Diagnosis not present

## 2020-01-20 DIAGNOSIS — I255 Ischemic cardiomyopathy: Secondary | ICD-10-CM | POA: Diagnosis not present

## 2020-01-20 DIAGNOSIS — N390 Urinary tract infection, site not specified: Secondary | ICD-10-CM | POA: Diagnosis not present

## 2020-01-20 DIAGNOSIS — R569 Unspecified convulsions: Secondary | ICD-10-CM | POA: Diagnosis not present

## 2020-01-23 ENCOUNTER — Encounter: Payer: Self-pay | Admitting: Cardiology

## 2020-01-23 ENCOUNTER — Ambulatory Visit: Payer: Medicare HMO | Admitting: Cardiology

## 2020-01-23 NOTE — Progress Notes (Deleted)
Cardiology Office Note  Date: 01/23/2020   ID: Norma [name], DOB March 30, 1934, MRN [id number]  PCP:  Caryl Bis, MD  Cardiologist:  Rozann Lesches, MD Electrophysiologist:  None   No chief complaint on file.   History of Present Illness: Norma [name] is an 86 y.o. female presenting to establish general cardiology follow-up, referred by Mr. Fenton PA-C in the atrial fibrillation clinic.  I reviewed her records and updated the chart.  Per review of her recent atrial fibrillation clinic documentation, most recent plan has been for conservative management with heart rate control strategy and anticoagulation.  CHA2DS2-VASc score is 8.  I reviewed her echocardiogram report from August 2020.  Past Medical History:  Diagnosis Date  . Anemia of other chronic disease   . Atrial fibrillation ([place])   . CAD (coronary artery disease)    a. cath 2008 - 30% prox Cx, 70% distal Cx treated medically.  . Chronic diastolic CHF (congestive heart failure) ([place])   . Chronic pain syndrome   . CKD (chronic kidney disease), stage III   . History of blood transfusion 1960's  . Pneumonia   . Stroke [facility]) ~ 2011  . Takotsubo cardiomyopathy    LVEF was 20-25%, but normalized in 2018.  . Type II diabetes mellitus ([place])     Past Surgical History:  Procedure Laterality Date  . ABDOMINAL HYSTERECTOMY    . APPENDECTOMY    . CARDIOVERSION N/A 06/08/2019   Procedure: CARDIOVERSION;  Surgeon: Donato Heinz, MD;  Location: [facility] ENDOSCOPY;  Service: Endoscopy;  Laterality: N/A;  . CATARACT EXTRACTION, BILATERAL    . CHOLECYSTECTOMY    . DILATION AND CURETTAGE OF UTERUS    . ESOPHAGOGASTRODUODENOSCOPY (EGD) WITH PROPOFOL N/A 04/17/2019   Procedure: ESOPHAGOGASTRODUODENOSCOPY (EGD) WITH PROPOFOL;  Surgeon: Otis Brace, MD;  Location: [facility];  Service: Gastroenterology;  Laterality: N/A;  . FOREIGN BODY REMOVAL  04/17/2019   Procedure: FOREIGN BODY REMOVAL;  Surgeon: Otis Brace, MD;  Location: [facility] ENDOSCOPY;  Service: Gastroenterology;;  . PATELLA RECONSTRUCTION Bilateral    "had my knee caps replaced" (12/23/2012)  . TONSILLECTOMY      Current Outpatient Medications  Medication Sig Dispense Refill  . apixaban (ELIQUIS) 5 MG TABS tablet Take 1 tablet (5 mg total) by mouth 2 (two) times daily. 60 tablet 0  . buPROPion (WELLBUTRIN XL) 300 MG 24 hr tablet     . carvedilol (COREG) 25 MG tablet Take 1 tablet (25 mg total) by mouth 2 (two) times daily with a meal. 60 tablet 3  . dextromethorphan-guaiFENesin (MUCINEX DM) 30-600 MG 12hr tablet Take 1 tablet by mouth 2 (two) times daily as needed for cough.    . DULoxetine (CYMBALTA) 60 MG capsule Take 60 mg by mouth 2 (two) times daily.     Marland Kitchen glipiZIDE (GLUCOTROL XL) 2.5 MG 24 hr tablet Take 2.5 mg by mouth daily with breakfast.    . insulin detemir (LEVEMIR) 100 UNIT/ML injection Inject 0.1 mLs (10 Units total) into the skin at bedtime. (Patient taking differently: Inject 8-22 Units into the skin at bedtime. ) 10 mL 11  . oxyCODONE-acetaminophen (PERCOCET) 10-325 MG tablet Take 1 tablet by mouth 4 (four) times daily as needed for pain. 20 tablet 0  . saccharomyces boulardii (FLORASTOR) 250 MG capsule Take 1 capsule (250 mg total) 2 (two) times daily by mouth. (Patient taking differently: Take 250 mg by mouth daily. ) 21 capsule 0  . simvastatin (ZOCOR) 20 MG tablet Take  20 mg by mouth daily.     Marland Kitchen ZINC OXIDE, TOPICAL, (DIAPER RASH) 10 % CREA Apply 1 application topically daily as needed.     No current facility-administered medications for this visit.   Allergies:  Patient has no known allergies.   Social History: The patient  reports that she has never smoked. She has never used smokeless tobacco. She reports that she does not drink alcohol or use drugs.   Family History: The patient's family history includes Cancer in her brother and father; Heart attack (age of onset: 55) in her father; Heart disease in her  daughter.   ROS:  Please see the history of present illness. Otherwise, complete review of systems is positive for {NONE DEFAULTED:18576::"none"}.  All other systems are reviewed and negative.   Physical Exam: VS:  There were no vitals taken for this visit., BMI There is no height or weight on file to calculate BMI.  Wt Readings from Last 3 Encounters:  12/03/19 134 lb 7.7 oz (61 kg)  10/27/19 153 lb (69.4 kg)  06/29/19 145 lb (65.8 kg)    General: Patient appears comfortable at rest. HEENT: Conjunctiva and lids normal, oropharynx clear with moist mucosa. Neck: Supple, no elevated JVP or carotid bruits, no thyromegaly. Lungs: Clear to auscultation, nonlabored breathing at rest. Cardiac: Regular rate and rhythm, no S3 or significant systolic murmur, no pericardial rub. Abdomen: Soft, nontender, no hepatomegaly, bowel sounds present, no guarding or rebound. Extremities: No pitting edema, distal pulses 2+. Skin: Warm and dry. Musculoskeletal: No kyphosis. Neuropsychiatric: Alert and oriented x3, affect grossly appropriate.  ECG:  An ECG dated 11/29/2019 was personally reviewed today and demonstrated:  Atypical atrial flutter with 2:1 block, increased voltage and nonspecific ST changes.  Recent Labwork: 10/27/2019: TSH 1.098 11/29/2019: ALT 18; AST 11; B Natriuretic Peptide 452.0 12/02/2019: BUN 16; Creatinine, Ser 1.10; Hemoglobin 10.8; Magnesium 1.6; Platelets 256; Potassium 4.1; Sodium 136   Other Studies Reviewed Today:  Echocardiogram 04/18/2019: 1. The left ventricle has mildly reduced systolic function, with an  ejection fraction of 45-50%. The cavity size was normal. There is mildly  increased left ventricular wall thickness. Left ventricular diastolic  Doppler parameters are indeterminate.  Hypokinesis of the mid anteroseptal and mid inferoseptal wall segments,  unusual pattern.  2. The right ventricle has mildly reduced systolic function. The cavity  was mildly enlarged.  There is no increase in right ventricular wall  thickness.  3. Left atrial size was moderately dilated.  4. Right atrial size was mildly dilated.  5. There is mild mitral annular calcification present. No evidence of  mitral valve stenosis. Mild mitral regurgitation.  6. Tricuspid valve regurgitation is mild-moderate.  7. The aortic valve is tricuspid. Mild calcification of the aortic valve.  No stenosis of the aortic valve.  8. The aorta is normal in size and structure.  9. The inferior vena cava was normal in size with <50% respiratory  variability. PA systolic pressure 31 mmHg.  10. The patient was in atrial fibrillation.   Assessment and Plan:    Medication Adjustments/Labs and Tests Ordered: Current medicines are reviewed at length with the patient today.  Concerns regarding medicines are outlined above.   Tests Ordered: No orders of the defined types were placed in this encounter.   Medication Changes: No orders of the defined types were placed in this encounter.   Disposition:  Follow up {follow up:15908}  Signed, Satira Sark, MD, [facility] 01/23/2020 8:12 AM    [facility] [place], [place], [place] [address] Phone: [phone]; Fax: [phone]

## 2020-01-25 DIAGNOSIS — I69351 Hemiplegia and hemiparesis following cerebral infarction affecting right dominant side: Secondary | ICD-10-CM | POA: Diagnosis not present

## 2020-01-25 DIAGNOSIS — I428 Other cardiomyopathies: Secondary | ICD-10-CM | POA: Diagnosis not present

## 2020-01-25 DIAGNOSIS — I5042 Chronic combined systolic (congestive) and diastolic (congestive) heart failure: Secondary | ICD-10-CM | POA: Diagnosis not present

## 2020-01-25 DIAGNOSIS — R569 Unspecified convulsions: Secondary | ICD-10-CM | POA: Diagnosis not present

## 2020-01-25 DIAGNOSIS — I130 Hypertensive heart and chronic kidney disease with heart failure and stage 1 through stage 4 chronic kidney disease, or unspecified chronic kidney disease: Secondary | ICD-10-CM | POA: Diagnosis not present

## 2020-01-25 DIAGNOSIS — N183 Chronic kidney disease, stage 3 unspecified: Secondary | ICD-10-CM | POA: Diagnosis not present

## 2020-01-25 DIAGNOSIS — I255 Ischemic cardiomyopathy: Secondary | ICD-10-CM | POA: Diagnosis not present

## 2020-01-25 DIAGNOSIS — N390 Urinary tract infection, site not specified: Secondary | ICD-10-CM | POA: Diagnosis not present

## 2020-01-25 DIAGNOSIS — E1122 Type 2 diabetes mellitus with diabetic chronic kidney disease: Secondary | ICD-10-CM | POA: Diagnosis not present

## 2020-01-27 DIAGNOSIS — I255 Ischemic cardiomyopathy: Secondary | ICD-10-CM | POA: Diagnosis not present

## 2020-01-27 DIAGNOSIS — E1122 Type 2 diabetes mellitus with diabetic chronic kidney disease: Secondary | ICD-10-CM | POA: Diagnosis not present

## 2020-01-27 DIAGNOSIS — N183 Chronic kidney disease, stage 3 unspecified: Secondary | ICD-10-CM | POA: Diagnosis not present

## 2020-01-27 DIAGNOSIS — I130 Hypertensive heart and chronic kidney disease with heart failure and stage 1 through stage 4 chronic kidney disease, or unspecified chronic kidney disease: Secondary | ICD-10-CM | POA: Diagnosis not present

## 2020-01-27 DIAGNOSIS — R569 Unspecified convulsions: Secondary | ICD-10-CM | POA: Diagnosis not present

## 2020-01-27 DIAGNOSIS — N390 Urinary tract infection, site not specified: Secondary | ICD-10-CM | POA: Diagnosis not present

## 2020-01-27 DIAGNOSIS — I69351 Hemiplegia and hemiparesis following cerebral infarction affecting right dominant side: Secondary | ICD-10-CM | POA: Diagnosis not present

## 2020-01-27 DIAGNOSIS — I5042 Chronic combined systolic (congestive) and diastolic (congestive) heart failure: Secondary | ICD-10-CM | POA: Diagnosis not present

## 2020-01-27 DIAGNOSIS — I428 Other cardiomyopathies: Secondary | ICD-10-CM | POA: Diagnosis not present

## 2020-02-01 DIAGNOSIS — I5042 Chronic combined systolic (congestive) and diastolic (congestive) heart failure: Secondary | ICD-10-CM | POA: Diagnosis not present

## 2020-02-01 DIAGNOSIS — E1122 Type 2 diabetes mellitus with diabetic chronic kidney disease: Secondary | ICD-10-CM | POA: Diagnosis not present

## 2020-02-01 DIAGNOSIS — I428 Other cardiomyopathies: Secondary | ICD-10-CM | POA: Diagnosis not present

## 2020-02-01 DIAGNOSIS — R569 Unspecified convulsions: Secondary | ICD-10-CM | POA: Diagnosis not present

## 2020-02-01 DIAGNOSIS — I130 Hypertensive heart and chronic kidney disease with heart failure and stage 1 through stage 4 chronic kidney disease, or unspecified chronic kidney disease: Secondary | ICD-10-CM | POA: Diagnosis not present

## 2020-02-01 DIAGNOSIS — I69351 Hemiplegia and hemiparesis following cerebral infarction affecting right dominant side: Secondary | ICD-10-CM | POA: Diagnosis not present

## 2020-02-01 DIAGNOSIS — I255 Ischemic cardiomyopathy: Secondary | ICD-10-CM | POA: Diagnosis not present

## 2020-02-01 DIAGNOSIS — N390 Urinary tract infection, site not specified: Secondary | ICD-10-CM | POA: Diagnosis not present

## 2020-02-01 DIAGNOSIS — N183 Chronic kidney disease, stage 3 unspecified: Secondary | ICD-10-CM | POA: Diagnosis not present

## 2020-02-02 ENCOUNTER — Encounter: Payer: Self-pay | Admitting: Cardiology

## 2020-02-02 ENCOUNTER — Other Ambulatory Visit: Payer: Self-pay

## 2020-02-02 ENCOUNTER — Ambulatory Visit (INDEPENDENT_AMBULATORY_CARE_PROVIDER_SITE_OTHER): Payer: Medicare HMO | Admitting: Cardiology

## 2020-02-02 VITALS — BP 102/70 | HR 68 | Ht 62.0 in | Wt 145.0 lb

## 2020-02-02 DIAGNOSIS — I4891 Unspecified atrial fibrillation: Secondary | ICD-10-CM

## 2020-02-02 DIAGNOSIS — I4892 Unspecified atrial flutter: Secondary | ICD-10-CM

## 2020-02-02 DIAGNOSIS — I429 Cardiomyopathy, unspecified: Secondary | ICD-10-CM

## 2020-02-02 NOTE — Patient Instructions (Addendum)
Medication Instructions:    Your physician recommends that you continue on your current medications as directed. Please refer to the Current Medication list given to you today.  Labwork:  NONE  Testing/Procedures:  NONE  Follow-Up:  Your physician recommends that you schedule a follow-up appointment in: 6 months (office)  Any Other Special Instructions Will Be Listed Below (If Applicable).  If you need a refill on your cardiac medications before your next appointment, please call your pharmacy. 

## 2020-02-02 NOTE — Progress Notes (Signed)
Cardiology Office Note  Date: 02/02/2020   ID: Norma [name], DOB [birth year], MRN [id number]  PCP:  Caryl Bis, MD  Cardiologist:  Rozann Lesches, MD Electrophysiologist:  None   Chief Complaint  Patient presents with  . Establish atrial fibrillation follow-up    History of Present Illness: Norma [name] is an 86 y.o. female referred by Mr. Fenton PA-C in the atrial fibrillation clinic to establish cardiology follow-up.  I reviewed the chart, she was last seen in February.  She has a history of stress-induced cardiomyopathy in 2012, more recently persistent atrial fibrillation with cardioversion attempt in September 2020 and subsequent reversion to atrial fibrillation.  She did not tolerate amiodarone and has been managed with heart rate control strategy.  She was in atypical atrial flutter by last ECG in March.  CHA2DS2-VASc score is 8.  She is here today with a family member, currently in a wheelchair.  She is relatively sedentary at baseline.  She was reportedly recently hospitalized at [facility] with a UTI.  No changes were made in her cardiac regimen.  Actually, by ECG today she is in sinus rhythm.  She does report intermittent sense of palpitations.  Last echocardiogram in August 2020 revealed LVEF 45 to 50% range.  Past Medical History:  Diagnosis Date  . Anemia of chronic disease   . Atrial fibrillation ([place])   . Atypical atrial flutter ([place])   . CAD (coronary artery disease)    a. cath 2008 - 30% prox Cx, 70% distal Cx treated medically.  . Chronic pain syndrome   . CKD (chronic kidney disease), stage III   . History of blood transfusion 1960's  . Pneumonia   . Secondary cardiomyopathy ([place])   . Stroke [facility]) ~ 2011  . Takotsubo cardiomyopathy    LVEF was 20-25%, but normalized in 2018.  . Type II diabetes mellitus ([place])     Past Surgical History:  Procedure Laterality Date  . ABDOMINAL HYSTERECTOMY    . APPENDECTOMY    . CARDIOVERSION N/A  06/08/2019   Procedure: CARDIOVERSION;  Surgeon: Donato Heinz, MD;  Location[facility] ENDOSCOPY;  Service: Endoscopy;  Laterality: N/A;  . CATARACT EXTRACTION, BILATERAL    . CHOLECYSTECTOMY    . DILATION AND CURETTAGE OF UTERUS    . ESOPHAGOGASTRODUODENOSCOPY (EGD) WITH PROPOFOL N/A 04/17/2019   Procedure: ESOPHAGOGASTRODUODENOSCOPY (EGD) WITH PROPOFOL;  Surgeon: Otis Brace, MD;  Location: [facility];  Service: Gastroenterology;  Laterality: N/A;  . FOREIGN BODY REMOVAL  04/17/2019   Procedure: FOREIGN BODY REMOVAL;  Surgeon: Otis Brace, MD;  Location: [facility] ENDOSCOPY;  Service: Gastroenterology;;  . PATELLA RECONSTRUCTION Bilateral    "had my knee caps replaced" (12/23/2012)  . TONSILLECTOMY      Current Outpatient Medications  Medication Sig Dispense Refill  . apixaban (ELIQUIS) 5 MG TABS tablet Take 1 tablet (5 mg total) by mouth 2 (two) times daily. 60 tablet 0  . buPROPion (WELLBUTRIN XL) 300 MG 24 hr tablet     . carvedilol (COREG) 25 MG tablet Take 1 tablet (25 mg total) by mouth 2 (two) times daily with a meal. 60 tablet 3  . dextromethorphan-guaiFENesin (MUCINEX DM) 30-600 MG 12hr tablet Take 1 tablet by mouth 2 (two) times daily as needed for cough.    . DULoxetine (CYMBALTA) 60 MG capsule Take 60 mg by mouth 2 (two) times daily.     Marland Kitchen glipiZIDE (GLUCOTROL XL) 2.5 MG 24 hr tablet Take 2.5 mg by mouth daily with  breakfast.    . insulin detemir (LEVEMIR) 100 UNIT/ML injection Inject 0.1 mLs (10 Units total) into the skin at bedtime. (Patient taking differently: Inject 8-22 Units into the skin at bedtime. ) 10 mL 11  . oxyCODONE-acetaminophen (PERCOCET) 10-325 MG tablet Take 1 tablet by mouth 4 (four) times daily as needed for pain. 20 tablet 0  . saccharomyces boulardii (FLORASTOR) 250 MG capsule Take 1 capsule (250 mg total) 2 (two) times daily by mouth. (Patient taking differently: Take 250 mg by mouth daily. ) 21 capsule 0  . simvastatin (ZOCOR) 20 MG tablet Take 20  mg by mouth daily.     Marland Kitchen ZINC OXIDE, TOPICAL, (DIAPER RASH) 10 % CREA Apply 1 application topically daily as needed.     No current facility-administered medications for this visit.   Allergies:  Patient has no known allergies.   ROS:   Weakness and fatigue, gait instability.  Physical Exam: VS:  BP 102/70   Pulse 68   Ht 5\' 2"  (1.575 m)   Wt 145 lb (65.8 kg)   SpO2 95%   BMI 26.52 kg/m , BMI Body mass index is 26.52 kg/m.  Wt Readings from Last 3 Encounters:  02/02/20 145 lb (65.8 kg)  12/03/19 134 lb 7.7 oz (61 kg)  10/27/19 153 lb (69.4 kg)    General: Frail-appearing elderly woman seated in wheelchair. HEENT: Conjunctiva and lids normal, wearing a mask. Neck: Supple, no elevated JVP or carotid bruits, no thyromegaly. Lungs: Clear to auscultation, nonlabored breathing at rest. Cardiac: Regular rate and rhythm, no S3, soft systolic murmur, no pericardial rub. Extremities: No pitting edema.  ECG:  An ECG dated 11/29/2019 was personally reviewed today and demonstrated:  Atypical atrial flutter with increased voltage and nonspecific ST changes.  Recent Labwork: 10/27/2019: TSH 1.098 11/29/2019: ALT 18; AST 11; B Natriuretic Peptide 452.0 12/02/2019: BUN 16; Creatinine, Ser 1.10; Hemoglobin 10.8; Magnesium 1.6; Platelets 256; Potassium 4.1; Sodium 136   Other Studies Reviewed Today:  Echocardiogram 04/18/2019: 1. The left ventricle has mildly reduced systolic function, with an  ejection fraction of 45-50%. The cavity size was normal. There is mildly  increased left ventricular wall thickness. Left ventricular diastolic  Doppler parameters are indeterminate.  Hypokinesis of the mid anteroseptal and mid inferoseptal wall segments,  unusual pattern.  2. The right ventricle has mildly reduced systolic function. The cavity  was mildly enlarged. There is no increase in right ventricular wall  thickness.  3. Left atrial size was moderately dilated.  4. Right atrial size was  mildly dilated.  5. There is mild mitral annular calcification present. No evidence of  mitral valve stenosis. Mild mitral regurgitation.  6. Tricuspid valve regurgitation is mild-moderate.  7. The aortic valve is tricuspid. Mild calcification of the aortic valve.  No stenosis of the aortic valve.  8. The aorta is normal in size and structure.  9. The inferior vena cava was normal in size with <50% respiratory  variability. PA systolic pressure 31 mmHg.  10. The patient was in atrial fibrillation.   Assessment and Plan:  1.  Persistent atrial fibrillation and atypical atrial flutter with CHA2DS2-VASc score of 8.  Actually, today she is back in sinus rhythm by ECG, but does report intermittent palpitations.  She did not hold sinus rhythm after cardioversion last year and did not tolerate subsequent trial of amiodarone.  Plan will be to continue observation on beta-blocker and Eliquis.  No further push for antiarrhythmic therapy or cardioversion.  2.  Secondary cardiomyopathy with LVEF 45 to 50% range as of August 2020.  She does not appear fluid overloaded.  Continue Coreg, presently not on diuretic.  3.  History of stroke with residua.  4.  Mixed hyperlipidemia on Zocor.  Medication Adjustments/Labs and Tests Ordered: Current medicines are reviewed at length with the patient today.  Concerns regarding medicines are outlined above.   Tests Ordered: Orders Placed This Encounter  Procedures  . EKG 12-Lead    Medication Changes: No orders of the defined types were placed in this encounter.   Disposition:  Follow up 6 months in the [place] office.  Signed, Satira Sark, MD, [facility] 02/02/2020 3:36 PM    [facility] [place], [place], [place] [address] Phone: [phone]; Fax: [phone]

## 2020-02-03 DIAGNOSIS — N183 Chronic kidney disease, stage 3 unspecified: Secondary | ICD-10-CM | POA: Diagnosis not present

## 2020-02-03 DIAGNOSIS — I5042 Chronic combined systolic (congestive) and diastolic (congestive) heart failure: Secondary | ICD-10-CM | POA: Diagnosis not present

## 2020-02-03 DIAGNOSIS — I428 Other cardiomyopathies: Secondary | ICD-10-CM | POA: Diagnosis not present

## 2020-02-03 DIAGNOSIS — I130 Hypertensive heart and chronic kidney disease with heart failure and stage 1 through stage 4 chronic kidney disease, or unspecified chronic kidney disease: Secondary | ICD-10-CM | POA: Diagnosis not present

## 2020-02-03 DIAGNOSIS — N390 Urinary tract infection, site not specified: Secondary | ICD-10-CM | POA: Diagnosis not present

## 2020-02-03 DIAGNOSIS — E1122 Type 2 diabetes mellitus with diabetic chronic kidney disease: Secondary | ICD-10-CM | POA: Diagnosis not present

## 2020-02-03 DIAGNOSIS — I69351 Hemiplegia and hemiparesis following cerebral infarction affecting right dominant side: Secondary | ICD-10-CM | POA: Diagnosis not present

## 2020-02-03 DIAGNOSIS — I255 Ischemic cardiomyopathy: Secondary | ICD-10-CM | POA: Diagnosis not present

## 2020-02-03 DIAGNOSIS — R569 Unspecified convulsions: Secondary | ICD-10-CM | POA: Diagnosis not present

## 2020-02-07 DIAGNOSIS — I130 Hypertensive heart and chronic kidney disease with heart failure and stage 1 through stage 4 chronic kidney disease, or unspecified chronic kidney disease: Secondary | ICD-10-CM | POA: Diagnosis not present

## 2020-02-07 DIAGNOSIS — R569 Unspecified convulsions: Secondary | ICD-10-CM | POA: Diagnosis not present

## 2020-02-07 DIAGNOSIS — I5042 Chronic combined systolic (congestive) and diastolic (congestive) heart failure: Secondary | ICD-10-CM | POA: Diagnosis not present

## 2020-02-07 DIAGNOSIS — I69351 Hemiplegia and hemiparesis following cerebral infarction affecting right dominant side: Secondary | ICD-10-CM | POA: Diagnosis not present

## 2020-02-07 DIAGNOSIS — N183 Chronic kidney disease, stage 3 unspecified: Secondary | ICD-10-CM | POA: Diagnosis not present

## 2020-02-07 DIAGNOSIS — E1122 Type 2 diabetes mellitus with diabetic chronic kidney disease: Secondary | ICD-10-CM | POA: Diagnosis not present

## 2020-02-07 DIAGNOSIS — N390 Urinary tract infection, site not specified: Secondary | ICD-10-CM | POA: Diagnosis not present

## 2020-02-07 DIAGNOSIS — I255 Ischemic cardiomyopathy: Secondary | ICD-10-CM | POA: Diagnosis not present

## 2020-02-07 DIAGNOSIS — I428 Other cardiomyopathies: Secondary | ICD-10-CM | POA: Diagnosis not present

## 2020-02-08 DIAGNOSIS — I255 Ischemic cardiomyopathy: Secondary | ICD-10-CM | POA: Diagnosis not present

## 2020-02-08 DIAGNOSIS — I130 Hypertensive heart and chronic kidney disease with heart failure and stage 1 through stage 4 chronic kidney disease, or unspecified chronic kidney disease: Secondary | ICD-10-CM | POA: Diagnosis not present

## 2020-02-08 DIAGNOSIS — I69351 Hemiplegia and hemiparesis following cerebral infarction affecting right dominant side: Secondary | ICD-10-CM | POA: Diagnosis not present

## 2020-02-08 DIAGNOSIS — N390 Urinary tract infection, site not specified: Secondary | ICD-10-CM | POA: Diagnosis not present

## 2020-02-08 DIAGNOSIS — I5042 Chronic combined systolic (congestive) and diastolic (congestive) heart failure: Secondary | ICD-10-CM | POA: Diagnosis not present

## 2020-02-08 DIAGNOSIS — I428 Other cardiomyopathies: Secondary | ICD-10-CM | POA: Diagnosis not present

## 2020-02-08 DIAGNOSIS — N183 Chronic kidney disease, stage 3 unspecified: Secondary | ICD-10-CM | POA: Diagnosis not present

## 2020-02-08 DIAGNOSIS — E1122 Type 2 diabetes mellitus with diabetic chronic kidney disease: Secondary | ICD-10-CM | POA: Diagnosis not present

## 2020-02-08 DIAGNOSIS — R569 Unspecified convulsions: Secondary | ICD-10-CM | POA: Diagnosis not present

## 2020-02-09 DIAGNOSIS — R748 Abnormal levels of other serum enzymes: Secondary | ICD-10-CM | POA: Diagnosis not present

## 2020-02-09 DIAGNOSIS — N184 Chronic kidney disease, stage 4 (severe): Secondary | ICD-10-CM | POA: Diagnosis not present

## 2020-02-09 DIAGNOSIS — E1122 Type 2 diabetes mellitus with diabetic chronic kidney disease: Secondary | ICD-10-CM | POA: Diagnosis not present

## 2020-02-09 DIAGNOSIS — R945 Abnormal results of liver function studies: Secondary | ICD-10-CM | POA: Diagnosis not present

## 2020-02-09 DIAGNOSIS — K219 Gastro-esophageal reflux disease without esophagitis: Secondary | ICD-10-CM | POA: Diagnosis not present

## 2020-02-09 DIAGNOSIS — R569 Unspecified convulsions: Secondary | ICD-10-CM | POA: Diagnosis not present

## 2020-02-09 DIAGNOSIS — E782 Mixed hyperlipidemia: Secondary | ICD-10-CM | POA: Diagnosis not present

## 2020-02-09 DIAGNOSIS — G301 Alzheimer's disease with late onset: Secondary | ICD-10-CM | POA: Diagnosis not present

## 2020-02-09 DIAGNOSIS — R5383 Other fatigue: Secondary | ICD-10-CM | POA: Diagnosis not present

## 2020-02-09 DIAGNOSIS — I10 Essential (primary) hypertension: Secondary | ICD-10-CM | POA: Diagnosis not present

## 2020-02-09 DIAGNOSIS — E1165 Type 2 diabetes mellitus with hyperglycemia: Secondary | ICD-10-CM | POA: Diagnosis not present

## 2020-02-10 DIAGNOSIS — R569 Unspecified convulsions: Secondary | ICD-10-CM | POA: Diagnosis not present

## 2020-02-10 DIAGNOSIS — I428 Other cardiomyopathies: Secondary | ICD-10-CM | POA: Diagnosis not present

## 2020-02-10 DIAGNOSIS — I255 Ischemic cardiomyopathy: Secondary | ICD-10-CM | POA: Diagnosis not present

## 2020-02-10 DIAGNOSIS — N183 Chronic kidney disease, stage 3 unspecified: Secondary | ICD-10-CM | POA: Diagnosis not present

## 2020-02-10 DIAGNOSIS — I5042 Chronic combined systolic (congestive) and diastolic (congestive) heart failure: Secondary | ICD-10-CM | POA: Diagnosis not present

## 2020-02-10 DIAGNOSIS — E1122 Type 2 diabetes mellitus with diabetic chronic kidney disease: Secondary | ICD-10-CM | POA: Diagnosis not present

## 2020-02-10 DIAGNOSIS — I69351 Hemiplegia and hemiparesis following cerebral infarction affecting right dominant side: Secondary | ICD-10-CM | POA: Diagnosis not present

## 2020-02-10 DIAGNOSIS — N390 Urinary tract infection, site not specified: Secondary | ICD-10-CM | POA: Diagnosis not present

## 2020-02-10 DIAGNOSIS — I130 Hypertensive heart and chronic kidney disease with heart failure and stage 1 through stage 4 chronic kidney disease, or unspecified chronic kidney disease: Secondary | ICD-10-CM | POA: Diagnosis not present

## 2020-02-13 DIAGNOSIS — N184 Chronic kidney disease, stage 4 (severe): Secondary | ICD-10-CM | POA: Diagnosis not present

## 2020-02-13 DIAGNOSIS — E1122 Type 2 diabetes mellitus with diabetic chronic kidney disease: Secondary | ICD-10-CM | POA: Diagnosis not present

## 2020-02-13 DIAGNOSIS — I130 Hypertensive heart and chronic kidney disease with heart failure and stage 1 through stage 4 chronic kidney disease, or unspecified chronic kidney disease: Secondary | ICD-10-CM | POA: Diagnosis not present

## 2020-02-13 DIAGNOSIS — E7849 Other hyperlipidemia: Secondary | ICD-10-CM | POA: Diagnosis not present

## 2020-02-13 DIAGNOSIS — I5032 Chronic diastolic (congestive) heart failure: Secondary | ICD-10-CM | POA: Diagnosis not present

## 2020-02-15 DIAGNOSIS — I255 Ischemic cardiomyopathy: Secondary | ICD-10-CM | POA: Diagnosis not present

## 2020-02-15 DIAGNOSIS — I428 Other cardiomyopathies: Secondary | ICD-10-CM | POA: Diagnosis not present

## 2020-02-15 DIAGNOSIS — N390 Urinary tract infection, site not specified: Secondary | ICD-10-CM | POA: Diagnosis not present

## 2020-02-15 DIAGNOSIS — R569 Unspecified convulsions: Secondary | ICD-10-CM | POA: Diagnosis not present

## 2020-02-15 DIAGNOSIS — I130 Hypertensive heart and chronic kidney disease with heart failure and stage 1 through stage 4 chronic kidney disease, or unspecified chronic kidney disease: Secondary | ICD-10-CM | POA: Diagnosis not present

## 2020-02-15 DIAGNOSIS — E1122 Type 2 diabetes mellitus with diabetic chronic kidney disease: Secondary | ICD-10-CM | POA: Diagnosis not present

## 2020-02-15 DIAGNOSIS — I5042 Chronic combined systolic (congestive) and diastolic (congestive) heart failure: Secondary | ICD-10-CM | POA: Diagnosis not present

## 2020-02-15 DIAGNOSIS — N183 Chronic kidney disease, stage 3 unspecified: Secondary | ICD-10-CM | POA: Diagnosis not present

## 2020-02-15 DIAGNOSIS — I69351 Hemiplegia and hemiparesis following cerebral infarction affecting right dominant side: Secondary | ICD-10-CM | POA: Diagnosis not present

## 2020-02-16 DIAGNOSIS — I428 Other cardiomyopathies: Secondary | ICD-10-CM | POA: Diagnosis not present

## 2020-02-16 DIAGNOSIS — I700 Atherosclerosis of aorta: Secondary | ICD-10-CM | POA: Diagnosis not present

## 2020-02-16 DIAGNOSIS — I10 Essential (primary) hypertension: Secondary | ICD-10-CM | POA: Diagnosis not present

## 2020-02-16 DIAGNOSIS — N183 Chronic kidney disease, stage 3 unspecified: Secondary | ICD-10-CM | POA: Diagnosis not present

## 2020-02-16 DIAGNOSIS — E782 Mixed hyperlipidemia: Secondary | ICD-10-CM | POA: Diagnosis not present

## 2020-02-16 DIAGNOSIS — I130 Hypertensive heart and chronic kidney disease with heart failure and stage 1 through stage 4 chronic kidney disease, or unspecified chronic kidney disease: Secondary | ICD-10-CM | POA: Diagnosis not present

## 2020-02-16 DIAGNOSIS — I69351 Hemiplegia and hemiparesis following cerebral infarction affecting right dominant side: Secondary | ICD-10-CM | POA: Diagnosis not present

## 2020-02-16 DIAGNOSIS — G301 Alzheimer's disease with late onset: Secondary | ICD-10-CM | POA: Diagnosis not present

## 2020-02-16 DIAGNOSIS — I255 Ischemic cardiomyopathy: Secondary | ICD-10-CM | POA: Diagnosis not present

## 2020-02-16 DIAGNOSIS — I5042 Chronic combined systolic (congestive) and diastolic (congestive) heart failure: Secondary | ICD-10-CM | POA: Diagnosis not present

## 2020-02-16 DIAGNOSIS — R4582 Worries: Secondary | ICD-10-CM | POA: Diagnosis not present

## 2020-02-16 DIAGNOSIS — R569 Unspecified convulsions: Secondary | ICD-10-CM | POA: Diagnosis not present

## 2020-02-16 DIAGNOSIS — N390 Urinary tract infection, site not specified: Secondary | ICD-10-CM | POA: Diagnosis not present

## 2020-02-16 DIAGNOSIS — E1122 Type 2 diabetes mellitus with diabetic chronic kidney disease: Secondary | ICD-10-CM | POA: Diagnosis not present

## 2020-02-16 DIAGNOSIS — F331 Major depressive disorder, recurrent, moderate: Secondary | ICD-10-CM | POA: Diagnosis not present

## 2020-02-17 DIAGNOSIS — E1122 Type 2 diabetes mellitus with diabetic chronic kidney disease: Secondary | ICD-10-CM | POA: Diagnosis not present

## 2020-02-17 DIAGNOSIS — I130 Hypertensive heart and chronic kidney disease with heart failure and stage 1 through stage 4 chronic kidney disease, or unspecified chronic kidney disease: Secondary | ICD-10-CM | POA: Diagnosis not present

## 2020-02-17 DIAGNOSIS — I255 Ischemic cardiomyopathy: Secondary | ICD-10-CM | POA: Diagnosis not present

## 2020-02-17 DIAGNOSIS — R569 Unspecified convulsions: Secondary | ICD-10-CM | POA: Diagnosis not present

## 2020-02-17 DIAGNOSIS — N390 Urinary tract infection, site not specified: Secondary | ICD-10-CM | POA: Diagnosis not present

## 2020-02-17 DIAGNOSIS — I428 Other cardiomyopathies: Secondary | ICD-10-CM | POA: Diagnosis not present

## 2020-02-17 DIAGNOSIS — I69351 Hemiplegia and hemiparesis following cerebral infarction affecting right dominant side: Secondary | ICD-10-CM | POA: Diagnosis not present

## 2020-02-17 DIAGNOSIS — I5042 Chronic combined systolic (congestive) and diastolic (congestive) heart failure: Secondary | ICD-10-CM | POA: Diagnosis not present

## 2020-02-17 DIAGNOSIS — N3090 Cystitis, unspecified without hematuria: Secondary | ICD-10-CM | POA: Diagnosis not present

## 2020-02-17 DIAGNOSIS — N183 Chronic kidney disease, stage 3 unspecified: Secondary | ICD-10-CM | POA: Diagnosis not present

## 2020-02-18 DIAGNOSIS — N183 Chronic kidney disease, stage 3 unspecified: Secondary | ICD-10-CM | POA: Diagnosis not present

## 2020-02-18 DIAGNOSIS — R4182 Altered mental status, unspecified: Secondary | ICD-10-CM | POA: Diagnosis not present

## 2020-02-18 DIAGNOSIS — E1165 Type 2 diabetes mellitus with hyperglycemia: Secondary | ICD-10-CM | POA: Diagnosis not present

## 2020-02-18 DIAGNOSIS — R58 Hemorrhage, not elsewhere classified: Secondary | ICD-10-CM | POA: Diagnosis not present

## 2020-02-18 DIAGNOSIS — K922 Gastrointestinal hemorrhage, unspecified: Secondary | ICD-10-CM | POA: Diagnosis not present

## 2020-02-18 DIAGNOSIS — A419 Sepsis, unspecified organism: Secondary | ICD-10-CM | POA: Diagnosis not present

## 2020-02-18 DIAGNOSIS — I5042 Chronic combined systolic (congestive) and diastolic (congestive) heart failure: Secondary | ICD-10-CM | POA: Diagnosis not present

## 2020-02-18 DIAGNOSIS — I4819 Other persistent atrial fibrillation: Secondary | ICD-10-CM | POA: Diagnosis not present

## 2020-02-18 DIAGNOSIS — R410 Disorientation, unspecified: Secondary | ICD-10-CM | POA: Diagnosis not present

## 2020-02-18 DIAGNOSIS — E1122 Type 2 diabetes mellitus with diabetic chronic kidney disease: Secondary | ICD-10-CM | POA: Diagnosis not present

## 2020-02-18 DIAGNOSIS — R1111 Vomiting without nausea: Secondary | ICD-10-CM | POA: Diagnosis not present

## 2020-02-18 DIAGNOSIS — K920 Hematemesis: Secondary | ICD-10-CM | POA: Diagnosis not present

## 2020-02-18 DIAGNOSIS — Z794 Long term (current) use of insulin: Secondary | ICD-10-CM | POA: Diagnosis not present

## 2020-02-19 ENCOUNTER — Inpatient Hospital Stay (HOSPITAL_COMMUNITY)
Admission: AD | Admit: 2020-02-19 | Discharge: 2020-02-23 | DRG: 380 | Disposition: A | Discharge status: Home Health | Payer: Medicare HMO | Source: Other Acute Inpatient Hospital | Attending: Student | Admitting: Student

## 2020-02-19 DIAGNOSIS — E782 Mixed hyperlipidemia: Secondary | ICD-10-CM | POA: Diagnosis present

## 2020-02-19 DIAGNOSIS — Z993 Dependence on wheelchair: Secondary | ICD-10-CM | POA: Diagnosis not present

## 2020-02-19 DIAGNOSIS — Z8701 Personal history of pneumonia (recurrent): Secondary | ICD-10-CM

## 2020-02-19 DIAGNOSIS — F329 Major depressive disorder, single episode, unspecified: Secondary | ICD-10-CM | POA: Diagnosis present

## 2020-02-19 DIAGNOSIS — F0390 Unspecified dementia without behavioral disturbance: Secondary | ICD-10-CM | POA: Diagnosis present

## 2020-02-19 DIAGNOSIS — R5381 Other malaise: Secondary | ICD-10-CM | POA: Diagnosis not present

## 2020-02-19 DIAGNOSIS — I420 Dilated cardiomyopathy: Secondary | ICD-10-CM | POA: Diagnosis not present

## 2020-02-19 DIAGNOSIS — I130 Hypertensive heart and chronic kidney disease with heart failure and stage 1 through stage 4 chronic kidney disease, or unspecified chronic kidney disease: Secondary | ICD-10-CM | POA: Diagnosis not present

## 2020-02-19 DIAGNOSIS — Z8673 Personal history of transient ischemic attack (TIA), and cerebral infarction without residual deficits: Secondary | ICD-10-CM | POA: Diagnosis not present

## 2020-02-19 DIAGNOSIS — G894 Chronic pain syndrome: Secondary | ICD-10-CM | POA: Diagnosis present

## 2020-02-19 DIAGNOSIS — N179 Acute kidney failure, unspecified: Secondary | ICD-10-CM | POA: Diagnosis present

## 2020-02-19 DIAGNOSIS — I428 Other cardiomyopathies: Secondary | ICD-10-CM | POA: Diagnosis not present

## 2020-02-19 DIAGNOSIS — E1169 Type 2 diabetes mellitus with other specified complication: Secondary | ICD-10-CM | POA: Diagnosis not present

## 2020-02-19 DIAGNOSIS — Z794 Long term (current) use of insulin: Secondary | ICD-10-CM

## 2020-02-19 DIAGNOSIS — D62 Acute posthemorrhagic anemia: Secondary | ICD-10-CM | POA: Diagnosis present

## 2020-02-19 DIAGNOSIS — E118 Type 2 diabetes mellitus with unspecified complications: Secondary | ICD-10-CM

## 2020-02-19 DIAGNOSIS — E119 Type 2 diabetes mellitus without complications: Secondary | ICD-10-CM

## 2020-02-19 DIAGNOSIS — I5042 Chronic combined systolic (congestive) and diastolic (congestive) heart failure: Secondary | ICD-10-CM | POA: Diagnosis not present

## 2020-02-19 DIAGNOSIS — Z7901 Long term (current) use of anticoagulants: Secondary | ICD-10-CM | POA: Diagnosis not present

## 2020-02-19 DIAGNOSIS — D649 Anemia, unspecified: Secondary | ICD-10-CM

## 2020-02-19 DIAGNOSIS — I429 Cardiomyopathy, unspecified: Secondary | ICD-10-CM | POA: Diagnosis present

## 2020-02-19 DIAGNOSIS — I5022 Chronic systolic (congestive) heart failure: Secondary | ICD-10-CM | POA: Diagnosis present

## 2020-02-19 DIAGNOSIS — N189 Chronic kidney disease, unspecified: Secondary | ICD-10-CM | POA: Diagnosis not present

## 2020-02-19 DIAGNOSIS — D631 Anemia in chronic kidney disease: Secondary | ICD-10-CM | POA: Diagnosis present

## 2020-02-19 DIAGNOSIS — N1831 Chronic kidney disease, stage 3a: Secondary | ICD-10-CM | POA: Diagnosis present

## 2020-02-19 DIAGNOSIS — E1122 Type 2 diabetes mellitus with diabetic chronic kidney disease: Secondary | ICD-10-CM | POA: Diagnosis not present

## 2020-02-19 DIAGNOSIS — I4811 Longstanding persistent atrial fibrillation: Secondary | ICD-10-CM | POA: Diagnosis not present

## 2020-02-19 DIAGNOSIS — I2510 Atherosclerotic heart disease of native coronary artery without angina pectoris: Secondary | ICD-10-CM | POA: Diagnosis not present

## 2020-02-19 DIAGNOSIS — K2211 Ulcer of esophagus with bleeding: Secondary | ICD-10-CM | POA: Diagnosis not present

## 2020-02-19 DIAGNOSIS — I484 Atypical atrial flutter: Secondary | ICD-10-CM | POA: Diagnosis not present

## 2020-02-19 DIAGNOSIS — I4819 Other persistent atrial fibrillation: Secondary | ICD-10-CM | POA: Diagnosis not present

## 2020-02-19 DIAGNOSIS — K2900 Acute gastritis without bleeding: Secondary | ICD-10-CM | POA: Diagnosis not present

## 2020-02-19 DIAGNOSIS — Z90710 Acquired absence of both cervix and uterus: Secondary | ICD-10-CM

## 2020-02-19 DIAGNOSIS — T18128A Food in esophagus causing other injury, initial encounter: Secondary | ICD-10-CM | POA: Diagnosis not present

## 2020-02-19 DIAGNOSIS — K922 Gastrointestinal hemorrhage, unspecified: Secondary | ICD-10-CM | POA: Diagnosis not present

## 2020-02-19 DIAGNOSIS — I255 Ischemic cardiomyopathy: Secondary | ICD-10-CM | POA: Diagnosis not present

## 2020-02-19 DIAGNOSIS — N183 Chronic kidney disease, stage 3 unspecified: Secondary | ICD-10-CM | POA: Diagnosis present

## 2020-02-19 DIAGNOSIS — R569 Unspecified convulsions: Secondary | ICD-10-CM | POA: Diagnosis not present

## 2020-02-19 DIAGNOSIS — A419 Sepsis, unspecified organism: Secondary | ICD-10-CM | POA: Diagnosis not present

## 2020-02-19 DIAGNOSIS — E1165 Type 2 diabetes mellitus with hyperglycemia: Secondary | ICD-10-CM | POA: Diagnosis not present

## 2020-02-19 DIAGNOSIS — Z79899 Other long term (current) drug therapy: Secondary | ICD-10-CM

## 2020-02-19 DIAGNOSIS — N1832 Chronic kidney disease, stage 3b: Secondary | ICD-10-CM | POA: Diagnosis not present

## 2020-02-19 DIAGNOSIS — F05 Delirium due to known physiological condition: Secondary | ICD-10-CM | POA: Diagnosis not present

## 2020-02-19 DIAGNOSIS — N390 Urinary tract infection, site not specified: Secondary | ICD-10-CM | POA: Diagnosis not present

## 2020-02-19 DIAGNOSIS — Z20822 Contact with and (suspected) exposure to covid-19: Secondary | ICD-10-CM | POA: Diagnosis not present

## 2020-02-19 DIAGNOSIS — K222 Esophageal obstruction: Secondary | ICD-10-CM | POA: Diagnosis not present

## 2020-02-19 DIAGNOSIS — K449 Diaphragmatic hernia without obstruction or gangrene: Secondary | ICD-10-CM | POA: Diagnosis present

## 2020-02-19 DIAGNOSIS — I69351 Hemiplegia and hemiparesis following cerebral infarction affecting right dominant side: Secondary | ICD-10-CM | POA: Diagnosis not present

## 2020-02-19 DIAGNOSIS — Z8249 Family history of ischemic heart disease and other diseases of the circulatory system: Secondary | ICD-10-CM

## 2020-02-19 DIAGNOSIS — G9341 Metabolic encephalopathy: Secondary | ICD-10-CM | POA: Diagnosis not present

## 2020-02-19 DIAGNOSIS — K920 Hematemesis: Secondary | ICD-10-CM | POA: Diagnosis not present

## 2020-02-19 DIAGNOSIS — Z7189 Other specified counseling: Secondary | ICD-10-CM | POA: Diagnosis not present

## 2020-02-19 LAB — CBC WITH DIFFERENTIAL/PLATELET
Abs Immature Granulocytes: 0.11 10*3/uL — ABNORMAL HIGH (ref 0.00–0.07)
Basophils Absolute: 0.1 10*3/uL (ref 0.0–0.1)
Basophils Relative: 0 %
Eosinophils Absolute: 0 10*3/uL (ref 0.0–0.5)
Eosinophils Relative: 0 %
HCT: 25.9 % — ABNORMAL LOW (ref 36.0–46.0)
Hemoglobin: 8.3 g/dL — ABNORMAL LOW (ref 12.0–15.0)
Immature Granulocytes: 1 %
Lymphocytes Relative: 27 %
Lymphs Abs: 3.7 10*3/uL (ref 0.7–4.0)
MCH: 29.5 pg (ref 26.0–34.0)
MCHC: 32 g/dL (ref 30.0–36.0)
MCV: 92.2 fL (ref 80.0–100.0)
Monocytes Absolute: 0.8 10*3/uL (ref 0.1–1.0)
Monocytes Relative: 6 %
Neutro Abs: 9.3 10*3/uL — ABNORMAL HIGH (ref 1.7–7.7)
Neutrophils Relative %: 66 %
Platelets: 269 10*3/uL (ref 150–400)
RBC: 2.81 MIL/uL — ABNORMAL LOW (ref 3.87–5.11)
RDW: 14.8 % (ref 11.5–15.5)
WBC: 14 10*3/uL — ABNORMAL HIGH (ref 4.0–10.5)
nRBC: 0 % (ref 0.0–0.2)

## 2020-02-19 LAB — COMPREHENSIVE METABOLIC PANEL
ALT: 19 U/L (ref 0–44)
AST: 18 U/L (ref 15–41)
Albumin: 2.2 g/dL — ABNORMAL LOW (ref 3.5–5.0)
Alkaline Phosphatase: 61 U/L (ref 38–126)
Anion gap: 10 (ref 5–15)
BUN: 43 mg/dL — ABNORMAL HIGH (ref 8–23)
CO2: 22 mmol/L (ref 22–32)
Calcium: 8.4 mg/dL — ABNORMAL LOW (ref 8.9–10.3)
Chloride: 103 mmol/L (ref 98–111)
Creatinine, Ser: 1.34 mg/dL — ABNORMAL HIGH (ref 0.44–1.00)
GFR calc Af Amer: 42 mL/min — ABNORMAL LOW (ref >60)
GFR calc non Af Amer: 37 mL/min — ABNORMAL LOW (ref >60)
Glucose, Bld: 301 mg/dL — ABNORMAL HIGH (ref 70–99)
Potassium: 5.3 mmol/L — ABNORMAL HIGH (ref 3.5–5.1)
Sodium: 135 mmol/L (ref 135–145)
Total Bilirubin: 0.7 mg/dL (ref 0.3–1.2)
Total Protein: 4.9 g/dL — ABNORMAL LOW (ref 6.5–8.1)

## 2020-02-19 LAB — GLUCOSE, CAPILLARY
Glucose-Capillary: 245 mg/dL — ABNORMAL HIGH (ref 70–99)
Glucose-Capillary: 187 mg/dL — ABNORMAL HIGH (ref 70–99)
Glucose-Capillary: 151 mg/dL — ABNORMAL HIGH (ref 70–99)

## 2020-02-19 LAB — MAGNESIUM: Magnesium: 1.6 mg/dL — ABNORMAL LOW (ref 1.7–2.4)

## 2020-02-19 LAB — LACTIC ACID, PLASMA
Lactic Acid, Venous: 1.6 mmol/L (ref 0.5–1.9)
Lactic Acid, Venous: 1.5 mmol/L (ref 0.5–1.9)

## 2020-02-19 LAB — HEMOGLOBIN A1C
Hgb A1c MFr Bld: 7.3 % — ABNORMAL HIGH (ref 4.8–5.6)
Mean Plasma Glucose: 162.81 mg/dL

## 2020-02-19 LAB — PROTIME-INR
INR: 1.2 (ref 0.8–1.2)
Prothrombin Time: 14.3 seconds (ref 11.4–15.2)

## 2020-02-19 MED ORDER — MORPHINE SULFATE (PF) 2 MG/ML IV SOLN
0.5000 mg | INTRAVENOUS | Status: DC | PRN
  Administered 2020-02-19 – 2020-02-21 (×4): 0.5 mg via INTRAVENOUS
  Filled 2020-02-19 (×4): qty 1

## 2020-02-19 MED ORDER — SODIUM CHLORIDE 0.9 % IV SOLN
150.00 | INTRAVENOUS | Status: DC
Start: ? — End: 2020-02-19

## 2020-02-19 MED ORDER — ONDANSETRON HCL 4 MG PO TABS: 4.0000 mg | ORAL_TABLET | Freq: Four times a day (QID) | ORAL | Status: DC | PRN

## 2020-02-19 MED ORDER — INSULIN ASPART 100 UNIT/ML ~~LOC~~ SOLN
0.0000 [IU] | SUBCUTANEOUS | Status: DC
  Administered 2020-02-19: 2 [IU] via SUBCUTANEOUS
  Administered 2020-02-19: 3 [IU] via SUBCUTANEOUS
  Administered 2020-02-19: 2 [IU] via SUBCUTANEOUS
  Administered 2020-02-20: 1 [IU] via SUBCUTANEOUS
  Administered 2020-02-22 (×2): 2 [IU] via SUBCUTANEOUS
  Administered 2020-02-22: 3 [IU] via SUBCUTANEOUS
  Administered 2020-02-23: 1 [IU] via SUBCUTANEOUS
  Administered 2020-02-23 (×3): 2 [IU] via SUBCUTANEOUS

## 2020-02-19 MED ORDER — SODIUM CHLORIDE 0.9 % IV SOLN: INTRAVENOUS | Status: DC

## 2020-02-19 MED ORDER — SODIUM CHLORIDE 0.9 % IV SOLN
125.00 | INTRAVENOUS | Status: DC
Start: ? — End: 2020-02-19

## 2020-02-19 MED ORDER — PANTOPRAZOLE SODIUM 40 MG IV SOLR
40.0000 mg | Freq: Two times a day (BID) | INTRAVENOUS | Status: DC
  Administered 2020-02-22 – 2020-02-23 (×2): 40 mg via INTRAVENOUS
  Filled 2020-02-19 (×2): qty 40

## 2020-02-19 MED ORDER — SODIUM CHLORIDE 0.9 % IV SOLN
80.0000 mg | Freq: Once | INTRAVENOUS | Status: AC
  Administered 2020-02-19: 80 mg via INTRAVENOUS
  Filled 2020-02-19: qty 80

## 2020-02-19 MED ORDER — SODIUM CHLORIDE 0.9 % IV SOLN
8.0000 mg/h | INTRAVENOUS | Status: DC
  Administered 2020-02-19 – 2020-02-22 (×6): 8 mg/h via INTRAVENOUS
  Filled 2020-02-19 (×9): qty 80

## 2020-02-19 MED ORDER — ONDANSETRON HCL 4 MG/2ML IJ SOLN: 4.0000 mg | Freq: Four times a day (QID) | INTRAMUSCULAR | Status: DC | PRN

## 2020-02-19 NOTE — H&P (Signed)
Norma [name] is an 86 y.o. female.   Chief Complaint: Hematemesis  HPI: The patient is a 86 yr old woman who carries a past medical history significant for atrial fibrillation for which she takes eliquis (last dose 01/17/2020), AOCD, CAD, chronic pain, CKD III, Hx CVA, HX CMO, EF 45%, and DM II. The patient presented to [facility] on the evening of 02/17/2020 with complaints of multiple episodes of hematemesis. Hemoglobin was 8.9 there. Her last previous hemoglobin had been 10.5 one month ago. As the patient was on Eliquis, she was given [place] and PPI.   [facility] were contacted and accepted the patient in transfer on the morning of 02/18/2020. She arrived here on the morning of 02/19/2020. Upon arrival here the patient is hemodynamically stable. Hemoglobin is 8.3. Lactic acid has declined from 2.8 at [facility] ED to 1.6 here. Creatinine is elevated at 1.35. It appears that  Magnesium is 1.6. WBC is 14. Albumin is low at 2.2. Gastroenterology has been consulted. The patient is on a protonix drip. She is NPO. We will continue to monitor H&H.   The patient is confused which is her baseline. She is unable to assist with history.  Past Medical History:  Diagnosis Date  . Anemia of chronic disease   . Atrial fibrillation ([place])   . Atypical atrial flutter ([place])   . CAD (coronary artery disease)    a. cath 2008 - 30% prox Cx, 70% distal Cx treated medically.  . Chronic pain syndrome   . CKD (chronic kidney disease), stage III   . History of blood transfusion 1960's  . Pneumonia   . Secondary cardiomyopathy ([place])   . Stroke [facility]) ~ 2011  . Takotsubo cardiomyopathy    LVEF was 20-25%, but normalized in 2018.  . Type II diabetes mellitus ([place])     Past Surgical History:  Procedure Laterality Date  . ABDOMINAL HYSTERECTOMY    . APPENDECTOMY    . CARDIOVERSION N/A 06/08/2019   Procedure: CARDIOVERSION;  Surgeon: Donato Heinz, MD;  Location: [facility] ENDOSCOPY;   Service: Endoscopy;  Laterality: N/A;  . CATARACT EXTRACTION, BILATERAL    . CHOLECYSTECTOMY    . DILATION AND CURETTAGE OF UTERUS    . ESOPHAGOGASTRODUODENOSCOPY (EGD) WITH PROPOFOL N/A 04/17/2019   Procedure: ESOPHAGOGASTRODUODENOSCOPY (EGD) WITH PROPOFOL;  Surgeon: Otis Brace, MD;  Location: [place];  Service: Gastroenterology;  Laterality: N/A;  . FOREIGN BODY REMOVAL  04/17/2019   Procedure: FOREIGN BODY REMOVAL;  Surgeon: Otis Brace, MD;  Location: [place] ENDOSCOPY;  Service: Gastroenterology;;  . PATELLA RECONSTRUCTION Bilateral    "had my knee caps replaced" (12/23/2012)  . TONSILLECTOMY      Family History  Problem Relation Age of Onset  . Heart attack Father 81       MI  . Cancer Father        leukemia  . Cancer Brother   . Heart disease Daughter    Social History:  reports that she has never smoked. She has never used smokeless tobacco. She reports that she does not drink alcohol or use drugs. Medications Prior to Admission  Medication Sig Dispense Refill  . apixaban (ELIQUIS) 5 MG TABS tablet Take 1 tablet (5 mg total) by mouth 2 (two) times daily. 60 tablet 0  . buPROPion (WELLBUTRIN XL) 300 MG 24 hr tablet Take 300 mg by mouth daily.     . carvedilol (COREG) 25 MG tablet Take 1 tablet (25 mg total) by mouth  2 (two) times daily with a meal. 60 tablet 3  . cholecalciferol (VITAMIN D) 25 MCG (1000 UNIT) tablet Take 2,000 Units by mouth daily.    . DULoxetine (CYMBALTA) 60 MG capsule Take 60 mg by mouth daily.    . ergocalciferol (VITAMIN D2) 1.25 MG (50000 UT) capsule Take 50,000 Units by mouth once a week.    Marland Kitchen glipiZIDE (GLUCOTROL XL) 2.5 MG 24 hr tablet Take 2.5 mg by mouth daily with breakfast.    . insulin detemir (LEVEMIR) 100 UNIT/ML injection Inject 0.1 mLs (10 Units total) into the skin at bedtime. (Patient taking differently: Inject 8-22 Units into the skin as needed. Per sliding scale) 10 mL 11  . losartan (COZAAR) 25 MG tablet Take 25 mg by mouth  daily.    Marland Kitchen oxyCODONE-acetaminophen (PERCOCET) 10-325 MG tablet Take 1 tablet by mouth 4 (four) times daily as needed for pain. 20 tablet 0  . saccharomyces boulardii (FLORASTOR) 250 MG capsule Take 1 capsule (250 mg total) 2 (two) times daily by mouth. (Patient taking differently: Take 250 mg by mouth daily. ) 21 capsule 0  . simvastatin (ZOCOR) 20 MG tablet Take 20 mg by mouth daily.     Marland Kitchen ZINC OXIDE, TOPICAL, (DIAPER RASH) 10 % CREA Apply 1 application topically daily as needed (skin).     Marland Kitchen azithromycin (ZITHROMAX) 250 MG tablet Take 250 mg by mouth as directed.    . cephALEXin (KEFLEX) 500 MG capsule Take 500 mg by mouth 4 (four) times daily.    . nitrofurantoin, macrocrystal-monohydrate, (MACROBID) 100 MG capsule Take 100 mg by mouth 2 (two) times daily.    Marland Kitchen sulfamethoxazole-trimethoprim (BACTRIM DS) 800-160 MG tablet Take 1 tablet by mouth 2 (two) times daily.      Allergies: No Known Allergies  Review of systems not obtained due to patient factors.  General appearance: alert, cooperative, cachectic, mild distress, slowed mentation and confused Head: Normocephalic, without obvious abnormality, atraumatic Eyes: conjunctivae/corneas clear. PERRL, EOM's intact. Fundi benign. Throat: lips, mucosa, and tongue normal; teeth and gums normal Neck: no adenopathy, no carotid bruit, no JVD, supple, symmetrical, trachea midline and thyroid not enlarged, symmetric, no tenderness/mass/nodules Resp: No increased work of breathing. No wheezes, rales, or rhonchi. No tactile fremitus. Chest wall: no tenderness Cardio: regular rate and rhythm, S1, S2 normal, no murmur, click, rub or gallop GI: soft, non-tender; bowel sounds normal; no masses,  no organomegaly Extremities: extremities normal, atraumatic, no cyanosis or edema Pulses: 2+ and symmetric Skin: Skin color, texture, turgor normal. No rashes or lesions Lymph nodes: Cervical, supraclavicular, and axillary nodes normal. Neurologic: Grossly  normal  Results for orders placed or performed during the hospital encounter of 02/19/20 (from the past 48 hour(s))  CBC WITH DIFFERENTIAL     Status: Abnormal   Collection Time: 02/19/20  8:46 AM  Result Value Ref Range   WBC 14.0 (H) 4.0 - 10.5 K/uL   RBC 2.81 (L) 3.87 - 5.11 MIL/uL   Hemoglobin 8.3 (L) 12.0 - 15.0 g/dL   HCT 25.9 (L) 36.0 - 46.0 %   MCV 92.2 80.0 - 100.0 fL   MCH 29.5 26.0 - 34.0 pg   MCHC 32.0 30.0 - 36.0 g/dL   RDW 14.8 11.5 - 15.5 %   Platelets 269 150 - 400 K/uL   nRBC 0.0 0.0 - 0.2 %   Neutrophils Relative % 66 %   Neutro Abs 9.3 (H) 1.7 - 7.7 K/uL   Lymphocytes Relative 27 %   Lymphs  Abs 3.7 0.7 - 4.0 K/uL   Monocytes Relative 6 %   Monocytes Absolute 0.8 0.1 - 1.0 K/uL   Eosinophils Relative 0 %   Eosinophils Absolute 0.0 0.0 - 0.5 K/uL   Basophils Relative 0 %   Basophils Absolute 0.1 0.0 - 0.1 K/uL   Immature Granulocytes 1 %   Abs Immature Granulocytes 0.11 (H) 0.00 - 0.07 K/uL    Comment: Performed at [facility] [address] [place], [place] [address]  Protime-INR     Status: None   Collection Time: 02/19/20  8:46 AM  Result Value Ref Range   Prothrombin Time 14.3 11.4 - 15.2 seconds   INR 1.2 0.8 - 1.2    Comment: (NOTE) INR goal varies based on device and disease states. Performed at [facility], [place] [address] [place], [place] [address]   Comprehensive metabolic panel     Status: Abnormal   Collection Time: 02/19/20  8:46 AM  Result Value Ref Range   Sodium 135 135 - 145 mmol/L   Potassium 5.3 (H) 3.5 - 5.1 mmol/L   Chloride 103 98 - 111 mmol/L   CO2 22 22 - 32 mmol/L   Glucose, Bld 301 (H) 70 - 99 mg/dL    Comment: Glucose reference range applies only to samples taken after fasting for at least 8 hours.   BUN 43 (H) 8 - 23 mg/dL   Creatinine, Ser 1.34 (H) 0.44 - 1.00 mg/dL   Calcium 8.4 (L) 8.9 - 10.3 mg/dL   Total Protein 4.9 (L) 6.5 - 8.1 g/dL   Albumin 2.2 (L) 3.5 - 5.0 g/dL   AST 18 15 - 41 U/L   ALT 19 0  - 44 U/L   Alkaline Phosphatase 61 38 - 126 U/L   Total Bilirubin 0.7 0.3 - 1.2 mg/dL   GFR calc non Af Amer 37 (L) >60 mL/min   GFR calc Af Amer 42 (L) >60 mL/min   Anion gap 10 5 - 15    Comment: Performed at [facility] [address], [place], [place] [address]  Magnesium     Status: Abnormal   Collection Time: 02/19/20  8:46 AM  Result Value Ref Range   Magnesium 1.6 (L) 1.7 - 2.4 mg/dL    Comment: Performed at [facility] [address], [place], [place] [address]  Lactic acid, plasma     Status: None   Collection Time: 02/19/20  8:46 AM  Result Value Ref Range   Lactic Acid, Venous 1.6 0.5 - 1.9 mmol/L    Comment: Performed at [facility] [address], [place], [place] [address]  Lactic acid, plasma     Status: None   Collection Time: 02/19/20 10:50 AM  Result Value Ref Range   Lactic Acid, Venous 1.5 0.5 - 1.9 mmol/L    Comment: Performed at [facility] [address], [place], [place] [address]  Hemoglobin A1c     Status: Abnormal   Collection Time: 02/19/20 10:50 AM  Result Value Ref Range   Hgb A1c MFr Bld 7.3 (H) 4.8 - 5.6 %    Comment: (NOTE) Pre diabetes:          5.7%-6.4% Diabetes:              >6.4% Glycemic control for   <7.0% adults with diabetes    Mean Plasma Glucose 162.81 mg/dL    Comment: Performed at [facility], [address] [place], [place] [address]  Glucose, capillary     Status: Abnormal   Collection Time: 02/19/20 11:48 AM  Result Value Ref Range   Glucose-Capillary 245 (H) 70 - 99 mg/dL    Comment: Glucose reference range applies only to samples taken after fasting for at least 8 hours.   @[id number] @  Blood pressure 131/78, pulse (!) 105, temperature 98.7 F (37.1 C), temperature source Oral, resp. rate (!) 22, weight 59.4 kg, SpO2 100 %.    Assessment/Plan  Upper GI Bleed: Multiple episodes of hematemesis prior to presentation at [facility] ED. NGT placed there now is producing only scant  coffee ground substance. H&H has dropped from 10.5 one month ago to 8.3. The patient has been started on a protonix drip. GI has been consulted. Will monitor H&H. Will transfuse for hemoglobin less than 7.0.Will continue on a clear liquid diet. NGT top be clamped per GI. Continue to hold eliquis. Plan is for endoscopy in the morning.   Atrial fibrillation: Continue Coreg 25 mg bid. Pt takes Eiliquis 5 mg bid for stroke prophylaxis. This has been held.   Secondary cardiomyopathy: LVEF 45-50% as of 04/2019.   History of Stroke: Noted. Residual deficits   Mixed hyperlipidemia: Continue Zocor.  DM II: The patient takes Levemir on a sliding scale (8-22) each evening as well as Glipizide XL 2.5 mg daily.  Depression: Continue Cymbalta.  I have seen and examined this patient myself. I have spent 32 minutes in her evaluation and care.  DVT Prophylaxis: SCD's CODE STATUS: Full Code Family Communication: Grandson at bedside Disposition: the patient is from home. I anticipate discharge to home. Barriers to discharge: Stabilization of blood loss anemia and evaluation of the upper GI track by GI via EGD.    Norma [name] 02/19/2020, 1:07 PM

## 2020-02-19 NOTE — Plan of Care (Signed)
  Problem: Education: Goal: Knowledge of General Education information will improve Description: Including pain rating scale, medication(s)/side effects and non-pharmacologic comfort measures 02/19/2020 2105 by Drenda Freeze, RN Outcome: Progressing 02/19/2020 2103 by Drenda Freeze, RN Outcome: Progressing   Problem: Health Behavior/Discharge Planning: Goal: Ability to manage health-related needs will improve 02/19/2020 2105 by Drenda Freeze, RN Outcome: Progressing 02/19/2020 2103 by Drenda Freeze, RN Outcome: Progressing

## 2020-02-19 NOTE — Progress Notes (Signed)
Pt admitted from [facility] ED. Admit notified.  CHG bath complete.  Vitals taken MEW score yellow.   Temp: 98.7 BP: 131/78 (94) PR: 105 RR: 22 O2: 100% (2L [place])  Pt is a bit pale and lethargic.  Pt is A&O X 2 (disoriented to situation and time, easily reoriented.)   Pt is neuro intact.  Pt will continue to be monitored.

## 2020-02-19 NOTE — Consult Note (Signed)
[facility] Consultation Note  Referring Provider: [facility] Primary Care Physician:  Caryl Bis, MD  Reason for Consultation:  hematemesis  HPI: Norma [name] is a 86 y.o. female admitted for hematemesis.  Patient is somnolent and confused; can not answer all my questions; her grandson is in the room.  Apparently over the past couple days patient has had some emesis (bilious) followed by at least a couple episodes of dark bloody emesis.  No reported melena or abdominal pain.  Has multiple cardiac and other comorbidities; is on apixaban, last dose yesterday?  NGT in place, with scant coffee ground output at this time.   Past Medical History:  Diagnosis Date  . Anemia of chronic disease   . Atrial fibrillation ([place])   . Atypical atrial flutter ([place])   . CAD (coronary artery disease)    a. cath 2008 - 30% prox Cx, 70% distal Cx treated medically.  . Chronic pain syndrome   . CKD (chronic kidney disease), stage III   . History of blood transfusion 1960's  . Pneumonia   . Secondary cardiomyopathy ([place])   . Stroke [facility]) ~ 2011  . Takotsubo cardiomyopathy    LVEF was 20-25%, but normalized in 2018.  . Type II diabetes mellitus ([place])     Past Surgical History:  Procedure Laterality Date  . ABDOMINAL HYSTERECTOMY    . APPENDECTOMY    . CARDIOVERSION N/A 06/08/2019   Procedure: CARDIOVERSION;  Surgeon: Donato Heinz, MD;  Location: [facility] ENDOSCOPY;  Service: Endoscopy;  Laterality: N/A;  . CATARACT EXTRACTION, BILATERAL    . CHOLECYSTECTOMY    . DILATION AND CURETTAGE OF UTERUS    . ESOPHAGOGASTRODUODENOSCOPY (EGD) WITH PROPOFOL N/A 04/17/2019   Procedure: ESOPHAGOGASTRODUODENOSCOPY (EGD) WITH PROPOFOL;  Surgeon: Otis Brace, MD;  Location: [place];  Service: Gastroenterology;  Laterality: N/A;  . FOREIGN BODY REMOVAL  04/17/2019   Procedure: FOREIGN BODY REMOVAL;  Surgeon: Otis Brace, MD;  Location: [facility] ENDOSCOPY;  Service:  Gastroenterology;;  . PATELLA RECONSTRUCTION Bilateral    "had my knee caps replaced" (12/23/2012)  . TONSILLECTOMY      Prior to Admission medications   Medication Sig Start Date End Date Taking? Authorizing Provider  apixaban (ELIQUIS) 5 MG TABS tablet Take 1 tablet (5 mg total) by mouth 2 (two) times daily. 06/08/19  Yes Rosita Fire, Brittainy M, PA-C  buPROPion (WELLBUTRIN XL) 300 MG 24 hr tablet Take 300 mg by mouth daily.  06/07/19  Yes [provider]  carvedilol (COREG) 25 MG tablet Take 1 tablet (25 mg total) by mouth 2 (two) times daily with a meal. 10/27/19  Yes Fenton, Clint R, PA  cholecalciferol (VITAMIN D) 25 MCG (1000 UNIT) tablet Take 2,000 Units by mouth daily.   Yes [provider]  DULoxetine (CYMBALTA) 60 MG capsule Take 60 mg by mouth daily.   Yes [provider]  ergocalciferol (VITAMIN D2) 1.25 MG (50000 UT) capsule Take 50,000 Units by mouth once a week.   Yes [provider]  glipiZIDE (GLUCOTROL XL) 2.5 MG 24 hr tablet Take 2.5 mg by mouth daily with breakfast.   Yes [provider]  insulin detemir (LEVEMIR) 100 UNIT/ML injection Inject 0.1 mLs (10 Units total) into the skin at bedtime. Patient taking differently: Inject 8-22 Units into the skin as needed. Per sliding scale 04/21/19  Yes Swayze, Ava, DO  losartan (COZAAR) 25 MG tablet Take 25 mg by mouth daily. 01/18/20  Yes [provider]  oxyCODONE-acetaminophen ([place]) 10-325  MG tablet Take 1 tablet by mouth 4 (four) times daily as needed for pain. 12/03/19  Yes Barton Dubois, MD  saccharomyces boulardii (FLORASTOR) 250 MG capsule Take 1 capsule (250 mg total) 2 (two) times daily by mouth. Patient taking differently: Take 250 mg by mouth daily.  07/29/17  Yes Debbe Odea, MD  simvastatin (ZOCOR) 20 MG tablet Take 20 mg by mouth daily.    Yes [provider]  ZINC OXIDE, TOPICAL, (DIAPER RASH) 10 % CREA Apply 1 application topically daily as needed (skin).     Yes [provider]  azithromycin (ZITHROMAX) 250 MG tablet Take 250 mg by mouth as directed. 12/06/19   [provider]  cephALEXin (KEFLEX) 500 MG capsule Take 500 mg by mouth 4 (four) times daily. 12/06/19   [provider]  nitrofurantoin, macrocrystal-monohydrate, (MACROBID) 100 MG capsule Take 100 mg by mouth 2 (two) times daily. 01/24/20   [provider]  sulfamethoxazole-trimethoprim (BACTRIM DS) 800-160 MG tablet Take 1 tablet by mouth 2 (two) times daily. 01/11/20   [provider]    Current Facility-Administered Medications  Medication Dose Route Frequency Provider Last Rate Last Admin  . 0.9 %  sodium chloride infusion   Intravenous Continuous Swayze, Ava, DO 100 mL/hr at 02/19/20 1100 New Bag at 02/19/20 1100  . insulin aspart (novoLOG) injection 0-9 Units  0-9 Units Subcutaneous Q4H Swayze, Ava, DO   3 Units at 02/19/20 1155  . morphine 2 MG/ML injection 0.5 mg  0.5 mg Intravenous Q2H PRN Swayze, Ava, DO      . ondansetron (ZOFRAN) tablet 4 mg  4 mg Oral Q6H PRN Swayze, Ava, DO       Or  . ondansetron (ZOFRAN) injection 4 mg  4 mg Intravenous Q6H PRN Swayze, Ava, DO      . pantoprazole (PROTONIX) 80 mg in sodium chloride 0.9 % 100 mL (0.8 mg/mL) infusion  8 mg/hr Intravenous Continuous Swayze, Ava, DO 10 mL/hr at 02/19/20 1204 8 mg/hr at 02/19/20 1204  . [START ON 02/22/2020] pantoprazole (PROTONIX) injection 40 mg  40 mg Intravenous Q12H Swayze, Ava, DO        Allergies as of 02/19/2020  . (No Known Allergies)    Family History  Problem Relation Age of Onset  . Heart attack Father 39       MI  . Cancer Father        leukemia  . Cancer Brother   . Heart disease Daughter     Social History   Socioeconomic History  . Marital status: Married    Spouse name: Not on file  . Number of children: Not on file  . Years of education: Not on file  . Highest education level: Not on file  Occupational History  . Occupation: RETIRED   Tobacco Use  . Smoking status: Never Smoker  . Smokeless tobacco: Never Used  Substance and Sexual Activity  . Alcohol use: No  . Drug use: No  . Sexual activity: Never  Other Topics Concern  . Not on file  Social History Narrative  . Not on file   Social Determinants of Health   Financial Resource Strain:   . Difficulty of Paying Living Expenses:   Food Insecurity:   . Worried About Charity fundraiser in the Last Year:   . Arboriculturist in the Last Year:   Transportation Needs:   . Film/video editor (Medical):   Marland Kitchen Lack of Transportation (Non-Medical):  Physical Activity:   . Days of Exercise per Week:   . Minutes of Exercise per Session:   Stress:   . Feeling of Stress :   Social Connections:   . Frequency of Communication with Friends and Family:   . Frequency of Social Gatherings with Friends and Family:   . Attends Religious Services:   . Active Member of Clubs or Organizations:   . Attends Archivist Meetings:   Marland Kitchen Marital Status:   Intimate Partner Violence:   . Fear of Current or Ex-Partner:   . Emotionally Abused:   Marland Kitchen Physically Abused:   . Sexually Abused:     Review of Systems: As per HPI, all others negative  Physical Exam: Vital signs in last 24 hours: Temp:  [98.7 F (37.1 C)] 98.7 F (37.1 C) (06/06 0720) Pulse Rate:  [105] 105 (06/06 0720) Resp:  [22] 22 (06/06 0720) BP: (131)/(78) 131/78 (06/06 0720) SpO2:  [100 %] 100 % (06/06 0720) Weight:  [59.4 kg] 59.4 kg (06/06 0720)   General:   Awake but confused, elderly, chronically ill-appearing Head:  Normocephalic and atraumatic. Eyes:  Sclera clear, no icterus.   Conjunctiva pink. Ears:  Normal auditory acuity. Nose:  No deformity, discharge,  or lesions. Mouth:  No deformity or lesions.  Oropharynx pink & moist. Neck:  Supple; no masses or thyromegaly. Abdomen:  Soft, nontender and nondistended. No masses, hepatosplenomegaly or hernias noted. Normal bowel sounds, without  guarding, and without rebound.     Msk:  Symmetrical without gross deformities. Normal posture. Pulses:  Normal pulses noted. Extremities:  Without clubbing or edema. Neurologic:  Confused. Skin:  Scattered ecchymoses Intact without significant lesions or rashes. Psych:  Confused, can't answer all questions   Lab Results: Recent Labs    02/19/20 0846  WBC 14.0*  HGB 8.3*  HCT 25.9*  PLT 269   BMET Recent Labs    02/19/20 0846  NA 135  K 5.3*  CL 103  CO2 22  GLUCOSE 301*  BUN 43*  CREATININE 1.34*  CALCIUM 8.4*   LFT Recent Labs    02/19/20 0846  PROT 4.9*  ALBUMIN 2.2*  AST 18  ALT 19  ALKPHOS 61  BILITOT 0.7   PT/INR Recent Labs    02/19/20 0846  LABPROT 14.3  INR 1.2    Studies/Results: No results found.  Impression:  1.  Hematemesis.  Sounds like Mallory Weiss tear. 2.  History food impaction; perhaps she had food stuck and retched and caused MWT. 3.  Acute blood loss anemia. 4.  Chronic anticoagulation, apixaban, on hold. 5.  Multiple cardiac comorbidities.  Plan:  1.  PPI, serial CBCs. 2.  Liquid diet ok. 3.  OK to clamp (or even remove) NGT. 4.  Apixaban on hold. 5.  Endoscopy planned for tomorrow morning, pending patient's clinical status and mental status. 6.  Procedure reviewed with grandson in patient's room; at this point, patient would not be able to sign her own consent form.   LOS: 0 days   Eufemia Prindle M  02/19/2020, 12:46 PM  Cell [phone] If no answer or after 5 PM call [phone]

## 2020-02-19 NOTE — H&P (View-Only) (Signed)
[facility] Consultation Note  Referring Provider: [facility] Primary Care Physician:  Caryl Bis, MD  Reason for Consultation:  hematemesis  HPI: Norma [name] is a 86 y.o. female admitted for hematemesis.  Patient is somnolent and confused; can not answer all my questions; her grandson is in the room.  Apparently over the past couple days patient has had some emesis (bilious) followed by at least a couple episodes of dark bloody emesis.  No reported melena or abdominal pain.  Has multiple cardiac and other comorbidities; is on apixaban, last dose yesterday?  NGT in place, with scant coffee ground output at this time.   Past Medical History:  Diagnosis Date   Anemia of chronic disease    Atrial fibrillation ([place])    Atypical atrial flutter (HCC)    CAD (coronary artery disease)    a. cath 2008 - 30% prox Cx, 70% distal Cx treated medically.   Chronic pain syndrome    CKD (chronic kidney disease), stage III    History of blood transfusion 1960's   Pneumonia    Secondary cardiomyopathy ([place])    Stroke ([place]) ~ 2011   Takotsubo cardiomyopathy    LVEF was 20-25%, but normalized in 2018.   Type II diabetes mellitus ([place])     Past Surgical History:  Procedure Laterality Date   ABDOMINAL HYSTERECTOMY     APPENDECTOMY     CARDIOVERSION N/A 06/08/2019   Procedure: CARDIOVERSION;  Surgeon: Donato Heinz, MD;  Location: [facility] ENDOSCOPY;  Service: Endoscopy;  Laterality: N/A;   CATARACT EXTRACTION, BILATERAL     CHOLECYSTECTOMY     DILATION AND CURETTAGE OF UTERUS     ESOPHAGOGASTRODUODENOSCOPY (EGD) WITH PROPOFOL N/A 04/17/2019   Procedure: ESOPHAGOGASTRODUODENOSCOPY (EGD) WITH PROPOFOL;  Surgeon: Otis Brace, MD;  Location: [place];  Service: Gastroenterology;  Laterality: N/A;   FOREIGN BODY REMOVAL  04/17/2019   Procedure: FOREIGN BODY REMOVAL;  Surgeon: Otis Brace, MD;  Location: [facility] ENDOSCOPY;  Service:  Gastroenterology;;   PATELLA RECONSTRUCTION Bilateral    "had my knee caps replaced" (12/23/2012)   TONSILLECTOMY      Prior to Admission medications   Medication Sig Start Date End Date Taking? Authorizing Provider  apixaban (ELIQUIS) 5 MG TABS tablet Take 1 tablet (5 mg total) by mouth 2 (two) times daily. 06/08/19  Yes Rosita Fire, Brittainy M, PA-C  buPROPion (WELLBUTRIN XL) 300 MG 24 hr tablet Take 300 mg by mouth daily.  06/07/19  Yes [provider]  carvedilol (COREG) 25 MG tablet Take 1 tablet (25 mg total) by mouth 2 (two) times daily with a meal. 10/27/19  Yes Fenton, Clint R, PA  cholecalciferol (VITAMIN D) 25 MCG (1000 UNIT) tablet Take 2,000 Units by mouth daily.   Yes [provider]  DULoxetine (CYMBALTA) 60 MG capsule Take 60 mg by mouth daily.   Yes [provider]  ergocalciferol (VITAMIN D2) 1.25 MG (50000 UT) capsule Take 50,000 Units by mouth once a week.   Yes [provider]  glipiZIDE (GLUCOTROL XL) 2.5 MG 24 hr tablet Take 2.5 mg by mouth daily with breakfast.   Yes [provider]  insulin detemir (LEVEMIR) 100 UNIT/ML injection Inject 0.1 mLs (10 Units total) into the skin at bedtime. Patient taking differently: Inject 8-22 Units into the skin as needed. Per sliding scale 04/21/19  Yes Swayze, Ava, DO  losartan (COZAAR) 25 MG tablet Take 25 mg by mouth daily. 01/18/20  Yes [provider]  oxyCODONE-acetaminophen (Minoa) 10-325  MG tablet Take 1 tablet by mouth 4 (four) times daily as needed for pain. 12/03/19  Yes Barton Dubois, MD  saccharomyces boulardii (FLORASTOR) 250 MG capsule Take 1 capsule (250 mg total) 2 (two) times daily by mouth. Patient taking differently: Take 250 mg by mouth daily.  07/29/17  Yes Debbe Odea, MD  simvastatin (ZOCOR) 20 MG tablet Take 20 mg by mouth daily.    Yes [provider]  ZINC OXIDE, TOPICAL, (DIAPER RASH) 10 % CREA Apply 1 application topically daily as needed (skin).     Yes [provider]  azithromycin (ZITHROMAX) 250 MG tablet Take 250 mg by mouth as directed. 12/06/19   [provider]  cephALEXin (KEFLEX) 500 MG capsule Take 500 mg by mouth 4 (four) times daily. 12/06/19   [provider]  nitrofurantoin, macrocrystal-monohydrate, (MACROBID) 100 MG capsule Take 100 mg by mouth 2 (two) times daily. 01/24/20   [provider]  sulfamethoxazole-trimethoprim (BACTRIM DS) 800-160 MG tablet Take 1 tablet by mouth 2 (two) times daily. 01/11/20   [provider]    Current Facility-Administered Medications  Medication Dose Route Frequency Provider Last Rate Last Admin   0.9 %  sodium chloride infusion   Intravenous Continuous Swayze, Ava, DO 100 mL/hr at 02/19/20 1100 New Bag at 02/19/20 1100   insulin aspart (novoLOG) injection 0-9 Units  0-9 Units Subcutaneous Q4H Swayze, Ava, DO   3 Units at 02/19/20 1155   morphine 2 MG/ML injection 0.5 mg  0.5 mg Intravenous Q2H PRN Swayze, Ava, DO       ondansetron (ZOFRAN) tablet 4 mg  4 mg Oral Q6H PRN Swayze, Ava, DO       Or   ondansetron (ZOFRAN) injection 4 mg  4 mg Intravenous Q6H PRN Swayze, Ava, DO       pantoprazole (PROTONIX) 80 mg in sodium chloride 0.9 % 100 mL (0.8 mg/mL) infusion  8 mg/hr Intravenous Continuous Swayze, Ava, DO 10 mL/hr at 02/19/20 1204 8 mg/hr at 02/19/20 1204   [START ON 02/22/2020] pantoprazole (PROTONIX) injection 40 mg  40 mg Intravenous Q12H Swayze, Ava, DO        Allergies as of 02/19/2020   (No Known Allergies)    Family History  Problem Relation Age of Onset   Heart attack Father 30       MI   Cancer Father        leukemia   Cancer Brother    Heart disease Daughter     Social History   Socioeconomic History   Marital status: Married    Spouse name: Not on file   Number of children: Not on file   Years of education: Not on file   Highest education level: Not on file  Occupational History   Occupation: RETIRED   Tobacco Use   Smoking status: Never Smoker   Smokeless tobacco: Never Used  Substance and Sexual Activity   Alcohol use: No   Drug use: No   Sexual activity: Never  Other Topics Concern   Not on file  Social History Narrative   Not on file   Social Determinants of Health   Financial Resource Strain:    Difficulty of Paying Living Expenses:   Food Insecurity:    Worried About Estate manager/land agent of Food in the Last Year:    Arboriculturist in the Last Year:   Transportation Needs:    Lack of Transportation (Medical):    Lack of Transportation (Non-Medical):  Physical Activity:    Days of Exercise per Week:    Minutes of Exercise per Session:   Stress:    Feeling of Stress :   Social Connections:    Frequency of Communication with Friends and Family:    Frequency of Social Gatherings with Friends and Family:    Attends Religious Services:    Active Member of Clubs or Organizations:    Attends Music therapist:    Marital Status:   Intimate Partner Violence:    Fear of Current or Ex-Partner:    Emotionally Abused:    Physically Abused:    Sexually Abused:     Review of Systems: As per HPI, all others negative  Physical Exam: Vital signs in last 24 hours: Temp:  [98.7 F (37.1 C)] 98.7 F (37.1 C) (06/06 0720) Pulse Rate:  [105] 105 (06/06 0720) Resp:  [22] 22 (06/06 0720) BP: (131)/(78) 131/78 (06/06 0720) SpO2:  [100 %] 100 % (06/06 0720) Weight:  [59.4 kg] 59.4 kg (06/06 0720)   General:   Awake but confused, elderly, chronically ill-appearing Head:  Normocephalic and atraumatic. Eyes:  Sclera clear, no icterus.   Conjunctiva pink. Ears:  Normal auditory acuity. Nose:  No deformity, discharge,  or lesions. Mouth:  No deformity or lesions.  Oropharynx pink & moist. Neck:  Supple; no masses or thyromegaly. Abdomen:  Soft, nontender and nondistended. No masses, hepatosplenomegaly or hernias noted. Normal bowel sounds, without  guarding, and without rebound.     Msk:  Symmetrical without gross deformities. Normal posture. Pulses:  Normal pulses noted. Extremities:  Without clubbing or edema. Neurologic:  Confused. Skin:  Scattered ecchymoses Intact without significant lesions or rashes. Psych:  Confused, can't answer all questions   Lab Results: Recent Labs    02/19/20 0846  WBC 14.0*  HGB 8.3*  HCT 25.9*  PLT 269   BMET Recent Labs    02/19/20 0846  NA 135  K 5.3*  CL 103  CO2 22  GLUCOSE 301*  BUN 43*  CREATININE 1.34*  CALCIUM 8.4*   LFT Recent Labs    02/19/20 0846  PROT 4.9*  ALBUMIN 2.2*  AST 18  ALT 19  ALKPHOS 61  BILITOT 0.7   PT/INR Recent Labs    02/19/20 0846  LABPROT 14.3  INR 1.2    Studies/Results: No results found.  Impression:  1.  Hematemesis.  Sounds like Mallory Weiss tear. 2.  History food impaction; perhaps she had food stuck and retched and caused MWT. 3.  Acute blood loss anemia. 4.  Chronic anticoagulation, apixaban, on hold. 5.  Multiple cardiac comorbidities.  Plan:  1.  PPI, serial CBCs. 2.  Liquid diet ok. 3.  OK to clamp (or even remove) NGT. 4.  Apixaban on hold. 5.  Endoscopy planned for tomorrow morning, pending patient's clinical status and mental status. 6.  Procedure reviewed with grandson in patient's room; at this point, patient would not be able to sign her own consent form.   LOS: 0 days   Kelsie Kramp M  02/19/2020, 12:46 PM  Cell [phone] If no answer or after 5 PM call [phone]

## 2020-02-19 NOTE — Plan of Care (Signed)
  Problem: Education: Goal: Knowledge of General Education information will improve Description Including pain rating scale, medication(s)/side effects and non-pharmacologic comfort measures Outcome: Progressing   Problem: Health Behavior/Discharge Planning: Goal: Ability to manage health-related needs will improve Outcome: Progressing   

## 2020-02-20 ENCOUNTER — Inpatient Hospital Stay (HOSPITAL_COMMUNITY): Payer: Medicare HMO | Admitting: Anesthesiology

## 2020-02-20 ENCOUNTER — Encounter (HOSPITAL_COMMUNITY)
Admission: AD | Disposition: A | Discharge status: Home Health | Payer: Self-pay | Source: Other Acute Inpatient Hospital | Attending: Internal Medicine

## 2020-02-20 DIAGNOSIS — D62 Acute posthemorrhagic anemia: Secondary | ICD-10-CM

## 2020-02-20 DIAGNOSIS — I420 Dilated cardiomyopathy: Secondary | ICD-10-CM

## 2020-02-20 DIAGNOSIS — I4819 Other persistent atrial fibrillation: Secondary | ICD-10-CM

## 2020-02-20 DIAGNOSIS — I2510 Atherosclerotic heart disease of native coronary artery without angina pectoris: Secondary | ICD-10-CM

## 2020-02-20 DIAGNOSIS — E1169 Type 2 diabetes mellitus with other specified complication: Secondary | ICD-10-CM

## 2020-02-20 DIAGNOSIS — N1832 Chronic kidney disease, stage 3b: Secondary | ICD-10-CM

## 2020-02-20 HISTORY — PX: ESOPHAGOGASTRODUODENOSCOPY (EGD) WITH PROPOFOL: SHX5813

## 2020-02-20 LAB — BASIC METABOLIC PANEL
Anion gap: 10 (ref 5–15)
BUN: 33 mg/dL — ABNORMAL HIGH (ref 8–23)
CO2: 22 mmol/L (ref 22–32)
Calcium: 8.2 mg/dL — ABNORMAL LOW (ref 8.9–10.3)
Chloride: 108 mmol/L (ref 98–111)
Creatinine, Ser: 1.26 mg/dL — ABNORMAL HIGH (ref 0.44–1.00)
GFR calc Af Amer: 46 mL/min — ABNORMAL LOW (ref >60)
GFR calc non Af Amer: 39 mL/min — ABNORMAL LOW (ref >60)
Glucose, Bld: 150 mg/dL — ABNORMAL HIGH (ref 70–99)
Potassium: 3.9 mmol/L (ref 3.5–5.1)
Sodium: 140 mmol/L (ref 135–145)

## 2020-02-20 LAB — SARS CORONAVIRUS 2 (TAT 6-24 HRS): SARS Coronavirus 2: NEGATIVE

## 2020-02-20 LAB — CBC
HCT: 18.5 % — ABNORMAL LOW (ref 36.0–46.0)
Hemoglobin: 5.9 g/dL — CL (ref 12.0–15.0)
MCH: 29.8 pg (ref 26.0–34.0)
MCHC: 31.9 g/dL (ref 30.0–36.0)
MCV: 93.4 fL (ref 80.0–100.0)
Platelets: 207 10*3/uL (ref 150–400)
RBC: 1.98 MIL/uL — ABNORMAL LOW (ref 3.87–5.11)
RDW: 15.3 % (ref 11.5–15.5)
WBC: 9.7 10*3/uL (ref 4.0–10.5)
nRBC: 0 % (ref 0.0–0.2)

## 2020-02-20 LAB — ABO/RH: ABO/RH(D): A POS

## 2020-02-20 LAB — GLUCOSE, CAPILLARY
Glucose-Capillary: 109 mg/dL — ABNORMAL HIGH (ref 70–99)
Glucose-Capillary: 109 mg/dL — ABNORMAL HIGH (ref 70–99)
Glucose-Capillary: 111 mg/dL — ABNORMAL HIGH (ref 70–99)
Glucose-Capillary: 117 mg/dL — ABNORMAL HIGH (ref 70–99)
Glucose-Capillary: 122 mg/dL — ABNORMAL HIGH (ref 70–99)
Glucose-Capillary: 122 mg/dL — ABNORMAL HIGH (ref 70–99)
Glucose-Capillary: 137 mg/dL — ABNORMAL HIGH (ref 70–99)
Glucose-Capillary: 86 mg/dL (ref 70–99)

## 2020-02-20 LAB — HEMOGLOBIN AND HEMATOCRIT, BLOOD
HCT: 24 % — ABNORMAL LOW (ref 36.0–46.0)
Hemoglobin: 7.9 g/dL — ABNORMAL LOW (ref 12.0–15.0)

## 2020-02-20 LAB — PREPARE RBC (CROSSMATCH)

## 2020-02-20 SURGERY — ESOPHAGOGASTRODUODENOSCOPY (EGD) WITH PROPOFOL
Anesthesia: Monitor Anesthesia Care | Laterality: Left

## 2020-02-20 MED ORDER — PROPOFOL 10 MG/ML IV BOLUS
INTRAVENOUS | Status: DC | PRN
  Administered 2020-02-20 (×3): 30 mg via INTRAVENOUS

## 2020-02-20 MED ORDER — LIDOCAINE 2% (20 MG/ML) 5 ML SYRINGE
INTRAMUSCULAR | Status: DC | PRN
  Administered 2020-02-20: 60 mg via INTRAVENOUS

## 2020-02-20 MED ORDER — SODIUM CHLORIDE 0.9% IV SOLUTION: Freq: Once | INTRAVENOUS | Status: AC

## 2020-02-20 MED ORDER — SODIUM CHLORIDE 0.9 % IV SOLN: INTRAVENOUS | Status: DC

## 2020-02-20 MED ORDER — SIMVASTATIN 20 MG PO TABS
20.0000 mg | ORAL_TABLET | Freq: Every day | ORAL | Status: DC
  Administered 2020-02-20 – 2020-02-22 (×3): 20 mg via ORAL
  Filled 2020-02-20 (×3): qty 1

## 2020-02-20 MED ORDER — PROPOFOL 500 MG/50ML IV EMUL
INTRAVENOUS | Status: DC | PRN
  Administered 2020-02-20: 75 ug/kg/min via INTRAVENOUS

## 2020-02-20 MED ORDER — CARVEDILOL 25 MG PO TABS
25.0000 mg | ORAL_TABLET | Freq: Two times a day (BID) | ORAL | Status: DC
  Administered 2020-02-20 – 2020-02-23 (×6): 25 mg via ORAL
  Filled 2020-02-20 (×6): qty 1

## 2020-02-20 SURGICAL SUPPLY — 15 items

## 2020-02-20 NOTE — Progress Notes (Signed)
PROGRESS NOTE  Norma [name] LYY:[id number] DOB: May 26, 1934 DOA: 02/19/2020 PCP: Caryl Bis, MD  Brief History   The patient is a 86 yr old woman who carries a past medical history significant for atrial fibrillation for which she takes eliquis (last dose 01/17/2020), AOCD, CAD, chronic pain, CKD III, Hx CVA, HX CMO, EF 45%, and DM II. The patient presented to [facility] on the evening of 02/17/2020 with complaints of multiple episodes of hematemesis. Hemoglobin was 8.9 there. Her last previous hemoglobin had been 10.5 one month ago. As the patient was on Eliquis, she was given [place] and PPI.   [facility] were contacted and accepted the patient in transfer on the morning of 02/18/2020. She arrived here on the morning of 02/19/2020. Upon arrival here the patient is hemodynamically stable. Hemoglobin is 8.3. Lactic acid has declined from 2.8 at [facility] ED to 1.6 here. Creatinine is elevated at 1.35. It appears that  Magnesium is 1.6. WBC is 14. Albumin is low at 2.2. Gastroenterology has been consulted. The patient is on a protonix drip. She is NPO. We will continue to monitor H&H.   The patient's hemoglobin dropped to 5.9 this morning. 2 units of blood ordered for transfusion. She underwent EGD which demonstrated a linear esophageal ulcer with stigmata of recent bleeding. She was also found to have edema and inflammation of the prepyloric region of the stomach. NGT was removed. There were small nonbleeding erosions in the gastric body. She has been continued on a protonix drip. She remains NPO as per GI recommendations.  Consultants  . Gastroenterology  Procedures  . EGD  Subjective  The patient is seen following her procedure. She has tolerated the procedure well. Daughter is at bedside.  Objective   Vitals:  Vitals:   02/20/20 1248 02/20/20 1524  BP: (!) 135/59 (!) 149/67  Pulse:    Resp: 16 12  Temp: 98.4 F (36.9 C) 98.2 F (36.8 C)  SpO2: 100%  100%   Exam:  Constitutional:  . The patient is awake, alert, and oriented x 3. No acute distress. Respiratory:  . No increased work of breathing. . No wheezes, rales, or rhonchi . No tactile fremitus Cardiovascular:  . Regular rate and rhythm . No murmurs, ectopy, or gallups. . No lateral PMI. No thrills. Abdomen:  . Abdomen is soft, non-tender, non-distended . No hernias, masses, or organomegaly . Normoactive bowel sounds.  Musculoskeletal:  . No cyanosis, clubbing, or edema Skin:  . No rashes, lesions, ulcers . palpation of skin: no induration or nodules Neurologic:  . CN 2-12 intact . Sensation all 4 extremities intact Psychiatric:  . Mental status o Mood, affect appropriate o Orientation to person, place, time  . judgment and insight appear intact   I have personally reviewed the following:   Today's Data  . CBC . CMP . Vitals  Scheduled Meds: . insulin aspart  0-9 Units Subcutaneous Q4H  . [START ON 02/22/2020] pantoprazole  40 mg Intravenous Q12H   Continuous Infusions: . sodium chloride 100 mL/hr at 02/20/20 0644  . sodium chloride    . pantoprozole (PROTONIX) infusion 8 mg/hr (02/20/20 1258)    Problem  Upper GI Bleed  Persistent atrial fibrillation (HCC)  Dm2 (Diabetes Mellitus, Type 2) (Hcc)  Dilated Cardiomyopathy (Hcc)  Chronic kidney disease (CKD), stage III (moderate) (HCC)  Cad (Coronary Artery Disease)   A & P  Acute blood loss anemia: In the setting of AOCD. The patient's hemoglobin  had decreased from 8.3 to 5.9. She has received 2 units of PRBC's in transfusion.  Upper GI Bleed: EGD today demonstrated esophageal ulcer as well as gastric erosions and prepyloric gastritis. The patient continues on a protonix drip. She remains NPO as per GI recommendations.  DM II: Follow FSBS with SSI. Metformin held. FSBS for the last 24 hours have run from 109 to 137.  Persistent Atrial Fibrillation: Heart rate is controlled with beta blockers held.  Eliquis also held due to acute bleed. Will restart rate control medications. Continue to monitor on telemetry.  Dilated Cardiomyopathy: EF is 45%. Monitor volume status.  CKD III: Creatinine appears to be at baseline. Monitor creatinine  CAD: Beta blockers to be restarted, but ASA will remain held. Restart statin.  I have seen and examined this patient myself. I have spent 36 minutes in her evaluation and care.  DVT prophylaxis: SCD's Code Status: Full Code Family Communication: Daughter at bedside Disposition Plan: The patient is from home. Anticipate discharge to home. Barriers to discharge: Need to monitor hemoglobin after transfusion of 2 units today for Hgb of 5.9. Diet not restarted by GI yet. Continue IV PPI.  Patient Status is inpatient.  Corrie Brannen, DO [facility] Direct contact: see [email or web address]  7PM-7AM contact night coverage as above 02/20/2020, 4:20 PM  LOS: 1 day    LOS: 1 day

## 2020-02-20 NOTE — Progress Notes (Signed)
Attempted to call phone number in patient record to obtain consent for procedure. No answer and unable to leave messenger. Will pass on to day shift.

## 2020-02-20 NOTE — Anesthesia Procedure Notes (Signed)
Procedure Name: MAC Date/Time: 02/20/2020 10:40 AM Performed by: Trinna Post., CRNA Pre-anesthesia Checklist: Patient identified, Emergency Drugs available, Suction available, Patient being monitored and Timeout performed Patient Re-evaluated:Patient Re-evaluated prior to induction Oxygen Delivery Method: Nasal cannula Preoxygenation: Pre-oxygenation with 100% oxygen Induction Type: IV induction Placement Confirmation: positive ETCO2

## 2020-02-20 NOTE — Anesthesia Preprocedure Evaluation (Addendum)
Anesthesia Evaluation  Patient identified by MRN, date of birth, ID band Patient confused    Reviewed: Allergy & Precautions, NPO status , Patient's Chart, lab work & pertinent test results, reviewed documented beta blocker date and time   Airway Mallampati: II  TM Distance: >3 FB Neck ROM: Full    Dental  (+) Edentulous Upper, Edentulous Lower   Pulmonary pneumonia, resolved,    Pulmonary exam normal breath sounds clear to auscultation       Cardiovascular + CAD and +CHF  + dysrhythmias Atrial Fibrillation  Rhythm:Irregular Rate:Normal  Hx/o dilated CM Takutsubo EF 20-25% EF normalized somewhat now  Echo 04/18/2019 1. The left ventricle has mildly reduced systolic function, with an ejection fraction of 45-50%. The cavity size was normal. There is mildly increased left ventricular wall thickness. Left ventricular diastolic Doppler parameters are indeterminate.  Hypokinesis of the mid anteroseptal and mid inferoseptal wall segments, unusual pattern.  2. The right ventricle has mildly reduced systolic function. The cavity was mildly enlarged. There is no increase in right ventricular wall thickness.  3. Left atrial size was moderately dilated.  4. Right atrial size was mildly dilated.  5. There is mild mitral annular calcification present. No evidence of mitral valve stenosis. Mild mitral regurgitation.  6. Tricuspid valve regurgitation is mild-moderate.  7. The aortic valve is tricuspid. Mild calcification of the aortic valve. No stenosis of the aortic valve.  8. The aorta is normal in size and structure.  9. The inferior vena cava was normal in size with <50% respiratory variability. PA systolic pressure 31 mmHg. 10. The patient was in atrial fibrillation.    Neuro/Psych CVA, No Residual Symptoms    GI/Hepatic   Endo/Other  diabetes, Poorly Controlled, Type 2, Oral Hypoglycemic Agents, Insulin Dependent  Renal/GU Renal  Insufficiency and CRFRenal disease  negative genitourinary   Musculoskeletal  (+) Arthritis ,   Abdominal   Peds  Hematology  (+) Blood dyscrasia, anemia , Eliquis therapy- last dose this am Plavix   Anesthesia Other Findings   Reproductive/Obstetrics                            Anesthesia Physical  Anesthesia Plan  ASA: III  Anesthesia Plan: MAC   Post-op Pain Management:    Induction: Intravenous  PONV Risk Score and Plan: 2 and Propofol infusion, Ondansetron and Treatment may vary due to age or medical condition  Airway Management Planned: Natural Airway and Nasal Cannula  Additional Equipment: None  Intra-op Plan:   Post-operative Plan:   Informed Consent: I have reviewed the patients History and Physical, chart, labs and discussed the procedure including the risks, benefits and alternatives for the proposed anesthesia with the patient or authorized representative who has indicated his/her understanding and acceptance.       Plan Discussed with: CRNA and Anesthesiologist  Anesthesia Plan Comments:        Anesthesia Quick Evaluation

## 2020-02-20 NOTE — Op Note (Signed)
[facility] Patient Name: Norma [name] Procedure Date : 02/20/2020 MRN: [id number] Attending MD: Lear Ng , MD Date of Birth: 10-31-1933 CSN: [id number] Age: 86 Admit Type: Inpatient Procedure:                Upper GI endoscopy Indications:              Recent gastrointestinal bleeding, Hematemesis Providers:                Lear Ng, MD, Glori Bickers, RN, Corie Chiquito, Technician, Dewitt Hoes, CRNA Referring MD:             hospital team Medicines:                Propofol per Anesthesia, Monitored Anesthesia Care Complications:            No immediate complications. Estimated Blood Loss:     Estimated blood loss: none. Procedure:                Pre-Anesthesia Assessment:                           - Prior to the procedure, a History and Physical                            was performed, and patient medications and                            allergies were reviewed. The patient's tolerance of                            previous anesthesia was also reviewed. The risks                            and benefits of the procedure and the sedation                            options and risks were discussed with the patient.                            All questions were answered, and informed consent                            was obtained. Prior Anticoagulants: The patient has                            taken Eliquis (apixaban), last dose was stopped at                            admission. ASA Grade Assessment: III - A patient                            with severe systemic disease. After reviewing the  risks and benefits, the patient was deemed in                            satisfactory condition to undergo the procedure.                           After obtaining informed consent, the endoscope was                            passed under direct vision. Throughout the                            procedure,  the patient's blood pressure, pulse, and                            oxygen saturations were monitored continuously. The                            GIF-H190 ([id number] Olympus gastroscope was                            introduced through the mouth, and advanced to the                            second part of duodenum. The upper GI endoscopy was                            accomplished without difficulty. The patient                            tolerated the procedure fairly well. Scope In: Scope Out: Findings:      One linear esophageal ulcer with stigmata of recent bleeding was found       32 cm from the incisors.      The Z-line was regular and was found 40 cm from the incisors.      Segmental mild inflammation characterized by congestion (edema) and       erythema was found in the prepyloric region of the stomach.      A small hiatal hernia was present.      The examined duodenum was normal.      NG removed during the procedure to better assess GEJ. Small nonbleeding       erosions noted in gastric body. Impression:               - Esophageal ulcer with stigmata of recent bleeding.                           - Z-line regular, 40 cm from the incisors.                           - Acute gastritis.                           - Small hiatal hernia.                           -  Normal examined duodenum.                           - No specimens collected.                           - Esophageal ulcer with black eschar source of                            recent bleeding. Avoid NG/OG tubes. Protonix drip.                            NPO for now. Recommendation:           - NPO.                           - Observe patient's clinical course. Procedure Code(s):        --- Professional ---                           [phone], Esophagogastroduodenoscopy, flexible,                            transoral; diagnostic, including collection of                            specimen(s) by brushing or washing, when  performed                            (separate procedure) Diagnosis Code(s):        --- Professional ---                           K92.2, Gastrointestinal hemorrhage, unspecified                           K92.0, Hematemesis                           K29.00, Acute gastritis without bleeding                           K22.11, Ulcer of esophagus with bleeding                           K44.9, Diaphragmatic hernia without obstruction or                            gangrene CPT copyright 2019 American Medical Association. All rights reserved. The codes documented in this report are preliminary and upon coder review may  be revised to meet current compliance requirements. Lear Ng, MD 02/20/2020 11:14:39 AM This report has been signed electronically. Number of Addenda: 0

## 2020-02-20 NOTE — Progress Notes (Signed)
Phlebotomy was unable to obtain morning labs from pt due to difficult lab draw.  She did not have another tech available to attempt at this time.  CBC and BMP ordered.

## 2020-02-20 NOTE — Interval H&P Note (Signed)
History and Physical Interval Note:  02/20/2020 10:32 AM  Norma [name]  has presented today for surgery, with the diagnosis of hematemesis, anemia.  The various methods of treatment have been discussed with the patient and family. After consideration of risks, benefits and other options for treatment, the patient has consented to  Procedure(s): ESOPHAGOGASTRODUODENOSCOPY (EGD) WITH PROPOFOL (Left) as a surgical intervention.  The patient's history has been reviewed, patient examined, no change in status, stable for surgery.  I have reviewed the patient's chart and labs.  Questions were answered to the patient's satisfaction.     Lear Ng

## 2020-02-20 NOTE — Progress Notes (Signed)
Pt grandaughter, Norma [name], at bedside asking if pt had received any of her routine pain meds today.  She takes percocet q4 at home for chronic back/knee pain.  She states the pt had complained to her of pain, though she hasn't mentioned it to staff today.  MD notified, but plans to hold home pain meds until pt is more alert.  For now, PRN morphine administered as ordered.

## 2020-02-20 NOTE — Progress Notes (Signed)
[facility] Gastroenterology Progress Note  Norma [name] 86 y.o. [birth year]  CC: Hematemesis  Subjective: Patient is feeling fine this morning.  Denies any complaints.  Denies abdominal pain, nausea, vomiting.  Denies chest pain.  Denies shortness of breath, states breathing feels fine.  Cannot provide history or reason for hospitalization, though per chart review, patient has confusion at baseline.  Per flowsheet, no stools since admission.  ROS : Review of Systems  Cardiovascular: Negative for chest pain and palpitations.  Gastrointestinal: Negative for abdominal pain, blood in stool, constipation, diarrhea, heartburn, melena, nausea and vomiting.    Objective: Vital signs in last 24 hours: Vitals:   02/20/20 0002 02/20/20 0340  BP: (!) 131/57 (!) 134/57  Pulse:    Resp: 19 (!) 24  Temp: 98.9 F (37.2 C) 98.8 F (37.1 C)  SpO2: 100% 100%    Physical Exam:  General:  Awake, oriented to self and location but not time or situation, cooperative, no acute distress, NGT and [place] in place  Head:  Normocephalic, without obvious abnormality, atraumatic  Eyes:  Conjunctival pallor, EOMs intact  Lungs:   Clear to auscultation bilaterally, respirations unlabored  Heart:  Regular rate and rhythm, S1, S2 normal  Abdomen:   Soft, non-tender, bowel sounds normoactive,  no guarding or peritoneal signs   Extremities: Extremities normal, atraumatic, no  Edema; SCDs in place  Pulses: 2+ and symmetric    Lab Results: Recent Labs    02/19/20 0846  NA 135  K 5.3*  CL 103  CO2 22  GLUCOSE 301*  BUN 43*  CREATININE 1.34*  CALCIUM 8.4*  MG 1.6*   Recent Labs    02/19/20 0846  AST 18  ALT 19  ALKPHOS 61  BILITOT 0.7  PROT 4.9*  ALBUMIN 2.2*   Recent Labs    02/19/20 0846  WBC 14.0*  NEUTROABS 9.3*  HGB 8.3*  HCT 25.9*  MCV 92.2  PLT 269   Recent Labs    02/19/20 0846  LABPROT 14.3  INR 1.2      Assessment: Hematemesis/upper GI bleeding in the setting of Eliquis  use -Hgb 8.3 6/6, decreased from 10.8 at baseline (11/2019); CBC today is pending -Elevated BUN:Cr as of 6/6 (BUN 43/ Cr 1.34); BMP today is pending  Plan: -EGD today with Dr. Michail Sermon  -Continue Protonix IV twice daily -Continue to hold Eliquis -Continue to monitor H&H -Continue supportive care with transfusion as needed to maintain hemoglobin greater than 7  [facility] will follow.  Salley Slaughter PA-C 02/20/2020, 8:40 AM  Contact #  [phone]

## 2020-02-20 NOTE — Transfer of Care (Signed)
Immediate Anesthesia Transfer of Care Note  Patient: SHACARRA CHOE  Procedure(s) Performed: ESOPHAGOGASTRODUODENOSCOPY (EGD) WITH PROPOFOL (Left )  Patient Location: PACU and Endoscopy Unit  Anesthesia Type:MAC  Level of Consciousness: awake and drowsy  Airway & Oxygen Therapy: Patient Spontanous Breathing and Patient connected to nasal cannula oxygen  Post-op Assessment: Report given to RN and Post -op Vital signs reviewed and stable  Post vital signs: Reviewed and stable  Last Vitals:  Vitals Value Taken Time  BP 163/66 02/20/20 1106  Temp    Pulse    Resp 23 02/20/20 1107  SpO2    Vitals shown include unvalidated device data.  Last Pain:  Vitals:   02/20/20 1008  TempSrc: Temporal  PainSc: 7       Patients Stated Pain Goal: 2 (62/70/35 0093)  Complications: No apparent anesthesia complications

## 2020-02-20 NOTE — Progress Notes (Addendum)
CRITICAL VALUE ALERT  Critical Value:  hgb   Date & Time Notied:  02/20/2020 @ 9798  Provider Notified: Benny Lennert, MD  Orders Received/Actions taken: 2 units PRBC orderd.  Pt currently in endoscopy, endo notified and will collect and send type and cross.

## 2020-02-21 LAB — CBC WITH DIFFERENTIAL/PLATELET
Abs Immature Granulocytes: 0.05 10*3/uL (ref 0.00–0.07)
Basophils Absolute: 0 10*3/uL (ref 0.0–0.1)
Basophils Relative: 0 %
Eosinophils Absolute: 0.1 10*3/uL (ref 0.0–0.5)
Eosinophils Relative: 1 %
HCT: 23.6 % — ABNORMAL LOW (ref 36.0–46.0)
Hemoglobin: 7.6 g/dL — ABNORMAL LOW (ref 12.0–15.0)
Immature Granulocytes: 1 %
Lymphocytes Relative: 22 %
Lymphs Abs: 1.8 10*3/uL (ref 0.7–4.0)
MCH: 29.2 pg (ref 26.0–34.0)
MCHC: 32.2 g/dL (ref 30.0–36.0)
MCV: 90.8 fL (ref 80.0–100.0)
Monocytes Absolute: 0.5 10*3/uL (ref 0.1–1.0)
Monocytes Relative: 6 %
Neutro Abs: 5.8 10*3/uL (ref 1.7–7.7)
Neutrophils Relative %: 70 %
Platelets: 167 10*3/uL (ref 150–400)
RBC: 2.6 MIL/uL — ABNORMAL LOW (ref 3.87–5.11)
RDW: 15.6 % — ABNORMAL HIGH (ref 11.5–15.5)
WBC: 8.3 10*3/uL (ref 4.0–10.5)
nRBC: 0 % (ref 0.0–0.2)

## 2020-02-21 LAB — TYPE AND SCREEN
ABO/RH(D): A POS
Antibody Screen: NEGATIVE
Unit division: 0
Unit division: 0

## 2020-02-21 LAB — BASIC METABOLIC PANEL
Anion gap: 12 (ref 5–15)
BUN: 20 mg/dL (ref 8–23)
CO2: 19 mmol/L — ABNORMAL LOW (ref 22–32)
Calcium: 7.9 mg/dL — ABNORMAL LOW (ref 8.9–10.3)
Chloride: 110 mmol/L (ref 98–111)
Creatinine, Ser: 1.03 mg/dL — ABNORMAL HIGH (ref 0.44–1.00)
GFR calc Af Amer: 58 mL/min — ABNORMAL LOW (ref >60)
GFR calc non Af Amer: 50 mL/min — ABNORMAL LOW (ref >60)
Glucose, Bld: 104 mg/dL — ABNORMAL HIGH (ref 70–99)
Potassium: 3.3 mmol/L — ABNORMAL LOW (ref 3.5–5.1)
Sodium: 141 mmol/L (ref 135–145)

## 2020-02-21 LAB — HEMOGLOBIN AND HEMATOCRIT, BLOOD
HCT: 24.7 % — ABNORMAL LOW (ref 36.0–46.0)
HCT: 25.3 % — ABNORMAL LOW (ref 36.0–46.0)
Hemoglobin: 7.9 g/dL — ABNORMAL LOW (ref 12.0–15.0)
Hemoglobin: 8.3 g/dL — ABNORMAL LOW (ref 12.0–15.0)

## 2020-02-21 LAB — BPAM RBC
Blood Product Expiration Date: 202106252359
Blood Product Expiration Date: 202106282359
ISSUE DATE / TIME: 202106071216
ISSUE DATE / TIME: 202106071704
Unit Type and Rh: 6200
Unit Type and Rh: 6200

## 2020-02-21 LAB — GLUCOSE, CAPILLARY
Glucose-Capillary: 101 mg/dL — ABNORMAL HIGH (ref 70–99)
Glucose-Capillary: 107 mg/dL — ABNORMAL HIGH (ref 70–99)
Glucose-Capillary: 120 mg/dL — ABNORMAL HIGH (ref 70–99)
Glucose-Capillary: 92 mg/dL (ref 70–99)
Glucose-Capillary: 93 mg/dL (ref 70–99)

## 2020-02-21 MED ORDER — GENERIC EXTERNAL MEDICATION
8.00 | Status: DC
Start: ? — End: 2020-02-21

## 2020-02-21 MED ORDER — OXYCODONE-ACETAMINOPHEN 5-325 MG PO TABS: 1.0000 | ORAL_TABLET | Freq: Four times a day (QID) | ORAL | Status: DC | PRN

## 2020-02-21 MED ORDER — OXYCODONE-ACETAMINOPHEN 5-325 MG PO TABS
1.0000 | ORAL_TABLET | Freq: Once | ORAL | Status: AC
  Administered 2020-02-21: 1 via ORAL
  Filled 2020-02-21: qty 1

## 2020-02-21 NOTE — Progress Notes (Signed)
[facility] Gastroenterology Progress Note  AMRI LIEN 86 y.o. 1933-11-20  CC: Esophageal ulcer  Subjective: Patient reports pain "all over."  Endorses mild sore throat.  Denies chest pain.  Denies abdominal pain.  Remains confused.  Oriented to self and location but not oriented to time or situation (reported year as March).  Per review of flowsheet, no further bowel movements or episodes of emesis since yesterday.  She had one mushy melenic stool yesterday afternoon.  ROS : Review of Systems  HENT: Positive for sore throat.   Respiratory: Negative for stridor.   Gastrointestinal: Negative for abdominal pain, blood in stool, constipation, diarrhea, heartburn, melena, nausea and vomiting.   Objective: Vital signs in last 24 hours: Vitals:   02/20/20 2028 02/21/20 0523  BP: (!) 141/59 (!) 157/84  Pulse: 66 69  Resp: 18 15  Temp: 98.8 F (37.1 C) 98.5 F (36.9 C)  SpO2: 95% 99%    Physical Exam:  General:  Awake, oriented to self and location but not time or situation, cooperative, no acute distress  Head:  Normocephalic, without obvious abnormality, atraumatic  Eyes:  Conjunctival pallor, EOMs intact   Lungs:   Clear to auscultation bilaterally, respirations unlabored  Heart:  Regular rate and rhythm, S1, S2 normal  Abdomen:   Soft, non-tender, bowel sounds normoactive,  no guarding or peritoneal signs    Extremities: Extremities normal, atraumatic, no  edema  Pulses: 2+ and symmetric    Lab Results: Recent Labs    02/19/20 0846 02/19/20 0846 02/20/20 0940 02/21/20 0628  NA 135   < > 140 141  K 5.3*   < > 3.9 3.3*  CL 103   < > 108 110  CO2 22   < > 22 19*  GLUCOSE 301*   < > 150* 104*  BUN 43*   < > 33* 20  CREATININE 1.34*   < > 1.26* 1.03*  CALCIUM 8.4*   < > 8.2* 7.9*  MG 1.6*  --   --   --    < > = values in this interval not displayed.   Recent Labs    02/19/20 0846  AST 18  ALT 19  ALKPHOS 61  BILITOT 0.7  PROT 4.9*  ALBUMIN 2.2*   Recent Labs     02/19/20 0846 02/19/20 0846 02/20/20 0940 02/20/20 0940 02/20/20 2248 02/21/20 0628  WBC 14.0*   < > 9.7  --   --  8.3  NEUTROABS 9.3*  --   --   --   --  5.8  HGB 8.3*   < > 5.9*   < > 7.9* 7.6*  HCT 25.9*   < > 18.5*   < > 24.0* 23.6*  MCV 92.2   < > 93.4  --   --  90.8  PLT 269   < > 207  --   --  167   < > = values in this interval not displayed.   Recent Labs    02/19/20 0846  LABPROT 14.3  INR 1.2   Assessment: Esophageal ulcer with recent hematemesis in the setting of Eliquis use -EGD yesterday showed esophageal ulcer with stigmata of recent bleeding and acute gastritis -Hgb 7.6 today, stable as compared to 7.9 yesterday.  Patient's hemoglobin was 5.9 yesterday but she received 2 units pRBCs with a rise to 7.9. -BUN decreased and now within normal limits.  BUN 20/Cr 1.03  -No further signs of GI bleeding  Plan: -Continue Protonix IV twice daily -  Continue to hold Eliquis -Continue to monitor H&H -Continue supportive care with transfusion as needed to maintain hemoglobin greater than 7 -Clear liquid diet if awake/mental status allows  [facility] will follow.  Salley Slaughter PA-C 02/21/2020, 9:34 AM  Contact #  [phone]

## 2020-02-21 NOTE — Progress Notes (Signed)
PROGRESS NOTE  KRYSLYN HELBIG LZJ:[id number] DOB: Dec 18, 1933 DOA: 02/19/2020 PCP: Caryl Bis, MD  Brief History   The patient is a 86 yr old woman who carries a past medical history significant for atrial fibrillation for which she takes eliquis (last dose 01/17/2020), AOCD, CAD, chronic pain, CKD III, Hx CVA, HX CMO, EF 45%, and DM II. The patient presented to [facility] on the evening of 02/17/2020 with complaints of multiple episodes of hematemesis. Hemoglobin was 8.9 there. Her last previous hemoglobin had been 10.5 one month ago. As the patient was on Eliquis, she was given [place] and PPI.   [facility] were contacted and accepted the patient in transfer on the morning of 02/18/2020. She arrived here on the morning of 02/19/2020. Upon arrival here the patient is hemodynamically stable. Hemoglobin is 8.3. Lactic acid has declined from 2.8 at [facility] ED to 1.6 here. Creatinine is elevated at 1.35. It appears that  Magnesium is 1.6. WBC is 14. Albumin is low at 2.2. Gastroenterology has been consulted. The patient is on a protonix drip. She is NPO. We will continue to monitor H&H.   The patient's hemoglobin dropped to 5.9 this morning. 2 units of blood ordered for transfusion. She underwent EGD which demonstrated a linear esophageal ulcer with stigmata of recent bleeding. She was also found to have edema and inflammation of the prepyloric region of the stomach. NGT was removed. There were small nonbleeding erosions in the gastric body. She has been continued on a protonix drip. She has been advanced to clears as per GI recommendations. She will be prescrived a carafate slurry once she is on a soft diet. Consultants  . Gastroenterology  Procedures  . EGD  Subjective  The patient is seen following her procedure. She has tolerated the procedure well.  Objective   Vitals:  Vitals:   02/21/20 0523 02/21/20 1005  BP: (!) 157/84 (!) 151/61  Pulse: 69 (!) 58  Resp:  15 17  Temp: 98.5 F (36.9 C) 98.6 F (37 C)  SpO2: 99% 100%   Exam:  Constitutional:  . The patient is awake, alert, and oriented x 3. No acute distress. Respiratory:  . No increased work of breathing. . No wheezes, rales, or rhonchi . No tactile fremitus Cardiovascular:  . Regular rate and rhythm . No murmurs, ectopy, or gallups. . No lateral PMI. No thrills. Abdomen:  . Abdomen is soft, non-tender, non-distended . No hernias, masses, or organomegaly . Normoactive bowel sounds.  Musculoskeletal:  . No cyanosis, clubbing, or edema Skin:  . No rashes, lesions, ulcers . palpation of skin: no induration or nodules Neurologic:  . CN 2-12 intact . Sensation all 4 extremities intact Psychiatric:  . Mental status o Mood, affect appropriate o Orientation to person, place, time  . judgment and insight appear intact  I have personally reviewed the following:   Today's Data  . CBC . BMP . Vitals  Scheduled Meds: . carvedilol  25 mg Oral BID WC  . insulin aspart  0-9 Units Subcutaneous Q4H  . oxyCODONE-acetaminophen  1 tablet Oral Once  . [START ON 02/22/2020] pantoprazole  40 mg Intravenous Q12H  . simvastatin  20 mg Oral q1800   Continuous Infusions: . sodium chloride 100 mL/hr at 02/20/20 0644  . sodium chloride    . pantoprozole (PROTONIX) infusion 8 mg/hr (02/21/20 1428)    No problems updated. A & P  Acute blood loss anemia: In the setting of AOCD.  The patient's hemoglobin had decreased from 8.3 to 5.9. She has received 2 units of PRBC's in transfusion.  Upper GI Bleed: EGD today demonstrated esophageal ulcer as well as gastric erosions and prepyloric gastritis. The patient continues on a protonix drip. She remains NPO as per GI recommendations.  DM II: Follow FSBS with SSI. Metformin held. FSBS for the last 24 hours have run from 92 to 117.  Persistent Atrial Fibrillation: Heart rate is controlled with beta blockers held. Eliquis also held due to acute bleed.  Will restart rate control medications. Continue to monitor on telemetry.  Dilated Cardiomyopathy: EF is 45%. Monitor volume status.  CKD III: Creatinine appears to be at baseline. Monitor creatinine  CAD: Beta blockers to be restarted, but ASA will remain held. Restart statin.  I have seen and examined this patient myself. I have spent 36 minutes in her evaluation and care.  DVT prophylaxis: SCD's Code Status: Full Code Family Communication: Daughter at bedside Disposition Plan: The patient is from home. Anticipate discharge to home. Barriers to discharge: Continued IV PPI per GI. Pt only on clears. Monitor hemoglobin. The patient is not cleared for discharge.  Patient Status is inpatient.  Lyndon Chapel, DO [facility] Direct contact: see [email or web address]  7PM-7AM contact night coverage as above 02/21/2020, 3:34 PM  LOS: 1 day    LOS: 2 days

## 2020-02-21 NOTE — Anesthesia Postprocedure Evaluation (Signed)
Anesthesia Post Note  Patient: Norma [name]  Procedure(s) Performed: ESOPHAGOGASTRODUODENOSCOPY (EGD) WITH PROPOFOL (Left )     Patient location during evaluation: PACU Anesthesia Type: MAC Level of consciousness: awake and alert Pain management: pain level controlled Vital Signs Assessment: post-procedure vital signs reviewed and stable Respiratory status: spontaneous breathing, nonlabored ventilation, respiratory function stable and patient connected to nasal cannula oxygen Cardiovascular status: stable and blood pressure returned to baseline Postop Assessment: no apparent nausea or vomiting Anesthetic complications: no    Last Vitals:  Vitals:   02/21/20 0523 02/21/20 1005  BP: (!) 157/84 (!) 151/61  Pulse: 69 (!) 58  Resp: 15 17  Temp: 36.9 C 37 C  SpO2: 99% 100%    Last Pain:  Vitals:   02/21/20 1005  TempSrc: Oral  PainSc:                  [name]

## 2020-02-22 DIAGNOSIS — R5381 Other malaise: Secondary | ICD-10-CM

## 2020-02-22 DIAGNOSIS — Z7189 Other specified counseling: Secondary | ICD-10-CM

## 2020-02-22 DIAGNOSIS — N189 Chronic kidney disease, unspecified: Secondary | ICD-10-CM

## 2020-02-22 DIAGNOSIS — N179 Acute kidney failure, unspecified: Secondary | ICD-10-CM

## 2020-02-22 DIAGNOSIS — I5022 Chronic systolic (congestive) heart failure: Secondary | ICD-10-CM

## 2020-02-22 DIAGNOSIS — K2211 Ulcer of esophagus with bleeding: Principal | ICD-10-CM

## 2020-02-22 DIAGNOSIS — G9341 Metabolic encephalopathy: Secondary | ICD-10-CM

## 2020-02-22 LAB — GLUCOSE, CAPILLARY
Glucose-Capillary: 87 mg/dL (ref 70–99)
Glucose-Capillary: 97 mg/dL (ref 70–99)
Glucose-Capillary: 162 mg/dL — ABNORMAL HIGH (ref 70–99)
Glucose-Capillary: 184 mg/dL — ABNORMAL HIGH (ref 70–99)
Glucose-Capillary: 203 mg/dL — ABNORMAL HIGH (ref 70–99)

## 2020-02-22 LAB — CBC
HCT: 28.1 % — ABNORMAL LOW (ref 36.0–46.0)
Hemoglobin: 9.2 g/dL — ABNORMAL LOW (ref 12.0–15.0)
MCH: 29.1 pg (ref 26.0–34.0)
MCHC: 32.7 g/dL (ref 30.0–36.0)
MCV: 88.9 fL (ref 80.0–100.0)
Platelets: 179 10*3/uL (ref 150–400)
RBC: 3.16 MIL/uL — ABNORMAL LOW (ref 3.87–5.11)
RDW: 14.9 % (ref 11.5–15.5)
WBC: 9.2 10*3/uL (ref 4.0–10.5)
nRBC: 0 % (ref 0.0–0.2)

## 2020-02-22 LAB — BASIC METABOLIC PANEL
Anion gap: 12 (ref 5–15)
BUN: 13 mg/dL (ref 8–23)
CO2: 20 mmol/L — ABNORMAL LOW (ref 22–32)
Calcium: 8.3 mg/dL — ABNORMAL LOW (ref 8.9–10.3)
Chloride: 106 mmol/L (ref 98–111)
Creatinine, Ser: 0.98 mg/dL (ref 0.44–1.00)
GFR calc Af Amer: >60 mL/min (ref >60)
GFR calc non Af Amer: 53 mL/min — ABNORMAL LOW (ref >60)
Glucose, Bld: 92 mg/dL (ref 70–99)
Potassium: 3.6 mmol/L (ref 3.5–5.1)
Sodium: 138 mmol/L (ref 135–145)

## 2020-02-22 LAB — HEMOGLOBIN AND HEMATOCRIT, BLOOD
HCT: 26.1 % — ABNORMAL LOW (ref 36.0–46.0)
Hemoglobin: 8.5 g/dL — ABNORMAL LOW (ref 12.0–15.0)

## 2020-02-22 LAB — MAGNESIUM: Magnesium: 1.3 mg/dL — ABNORMAL LOW (ref 1.7–2.4)

## 2020-02-22 MED ORDER — OXYCODONE HCL 5 MG PO TABS
5.0000 mg | ORAL_TABLET | Freq: Three times a day (TID) | ORAL | Status: DC | PRN
  Administered 2020-02-23: 5 mg via ORAL
  Filled 2020-02-22: qty 1

## 2020-02-22 MED ORDER — ACETAMINOPHEN 500 MG PO TABS
1000.0000 mg | ORAL_TABLET | Freq: Three times a day (TID) | ORAL | Status: DC
  Administered 2020-02-22 – 2020-02-23 (×3): 1000 mg via ORAL
  Filled 2020-02-22 (×3): qty 2

## 2020-02-22 MED ORDER — SUCRALFATE 1 GM/10ML PO SUSP
1.0000 g | Freq: Three times a day (TID) | ORAL | Status: DC
  Administered 2020-02-22 – 2020-02-23 (×6): 1 g via ORAL
  Filled 2020-02-22 (×6): qty 10

## 2020-02-22 MED ORDER — BUPROPION HCL ER (XL) 150 MG PO TB24
300.0000 mg | ORAL_TABLET | Freq: Every day | ORAL | Status: DC
  Administered 2020-02-22 – 2020-02-23 (×2): 300 mg via ORAL
  Filled 2020-02-22 (×2): qty 2

## 2020-02-22 MED ORDER — DULOXETINE HCL 30 MG PO CPEP
30.0000 mg | ORAL_CAPSULE | Freq: Every day | ORAL | Status: DC
  Administered 2020-02-22 – 2020-02-23 (×2): 30 mg via ORAL
  Filled 2020-02-22 (×2): qty 1

## 2020-02-22 MED ORDER — POTASSIUM CHLORIDE CRYS ER 20 MEQ PO TBCR
40.0000 meq | EXTENDED_RELEASE_TABLET | Freq: Once | ORAL | Status: AC
  Administered 2020-02-22: 40 meq via ORAL
  Filled 2020-02-22: qty 2

## 2020-02-22 MED ORDER — MAGNESIUM SULFATE 4 GM/100ML IV SOLN
4.0000 g | Freq: Once | INTRAVENOUS | Status: AC
  Administered 2020-02-22: 4 g via INTRAVENOUS
  Filled 2020-02-22: qty 100

## 2020-02-22 NOTE — TOC Initial Note (Signed)
Transition of Care [facility]) - Initial/Assessment Note    Patient Details  Name: Norma [name] MRN: [id number] Date of Birth: 18-Mar-1934  Transition of Care [facility]) CM/SW Contact:    Marilu Favre, RN Phone Number: 02/22/2020, 12:50 PM  Clinical Narrative:                 Confirmed face sheet information with patient at bedside. Patient from home with husband. Patient has walker, shower chair and bedside commode at home already.   Expected Discharge Plan: Home/Self Care Barriers to Discharge: Continued Medical Work up   Patient Goals and CMS Choice Patient states their goals for this hospitalization and ongoing recovery are:: to return to home CMS Medicare.gov Compare Post Acute Care list provided to:: Patient Choice offered to / list presented to : NA  Expected Discharge Plan and Services Expected Discharge Plan: Home/Self Care       Living arrangements for the past 2 months: Single Family Home                 DME Arranged: N/A         HH Arranged: NA          Prior Living Arrangements/Services Living arrangements for the past 2 months: Single Family Home   Patient language and need for interpreter reviewed:: Yes        Need for Family Participation in Patient Care: Yes (Comment) Care giver support system in place?: Yes (comment) Current home services: DME Criminal Activity/Legal Involvement Pertinent to Current Situation/Hospitalization: No - Comment as needed  Activities of Daily Living      Permission Sought/Granted   Permission granted to share information with : No              Emotional Assessment Appearance:: Appears stated age     Orientation: : Oriented to Situation, Oriented to  Time, Oriented to Place, Oriented to Self Alcohol / Substance Use: Not Applicable Psych Involvement: No (comment)  Admission diagnosis:  Upper GI bleed [K92.2] Patient Active Problem List   Diagnosis Date Noted  . Upper GI bleed 02/19/2020  . Altered  mental status   . Multifocal pneumonia   . Goals of care, counseling/discussion   . Palliative care by specialist   . DNR (do not resuscitate) discussion   . Sepsis due to undetermined organism ([place]) 11/29/2019  . Chronic combined systolic and diastolic congestive heart failure ([place]) 11/29/2019  . Lobar pneumonia ([place]) 11/29/2019  . Secondary hypercoagulable state ([place]) 10/27/2019  . Atypical atrial flutter ([place]) 06/16/2019  . Chronic diastolic heart failure ([place])   . Persistent atrial fibrillation ([place]) 04/18/2019  . Atrial fibrillation with RVR ([place]) 04/17/2019  . History of completed stroke 04/17/2019  . Food impaction of esophagus   . Complicated UTI (urinary tract infection) 07/29/2017  . Bacteremia due to Klebsiella pneumoniae 07/28/2017  . Klebsiella sepsis ([place]) 07/28/2017  . Acute pyelonephritis 07/27/2017  . Acute renal failure superimposed on stage 3 chronic kidney disease ([place]) 07/26/2017  . Uncontrolled type 2 diabetes mellitus with hyperglycemia, with long-term current use of insulin ([place]) 07/26/2017  . Acute metabolic encephalopathy 33/29/5188  . Sepsis ([place]) 06/18/2017  . Acute renal failure superimposed on stage 2 chronic kidney disease ([place]) 06/18/2017  . Abdominal pain   . Urinary tract infection without hematuria   . Lower urinary tract infectious disease 02/26/2016  . Weakness generalized 02/26/2016  . Weakness 02/26/2016  . DM2 (diabetes mellitus, type 2) ([place]) 02/26/2016  . Hemoptysis 12/24/2012  .  Dyspnea on exertion 03/08/2012  . Palpitations 02/05/2012  . Pulmonary nodule 02/05/2012  . Nausea and vomiting 01/03/2011  . Dilated cardiomyopathy ([place]) 12/04/2010  . Acute on chronic congestive heart failure ([place]) 12/04/2010  . Abnormal EKG 12/04/2010  . Pulmonary hypertension ([place]) 12/04/2010  . Hypotension 12/04/2010  . Chronic kidney disease (CKD), stage III (moderate) ([place]) 12/04/2010  . CAD (coronary artery disease) 12/04/2010   PCP:  Caryl Bis, MD Pharmacy:   [place], [address] [place] [address] Phone: [phone] Fax: [phone]  [id number] - [place], [place] [address] [place] [address] Phone: [phone] Fax: [phone]     Social Determinants of Health (SDOH) Interventions    Readmission Risk Interventions No flowsheet data found.

## 2020-02-22 NOTE — Progress Notes (Signed)
PROGRESS NOTE  KIYLEE THORESON [name]:[id number] DOB: 1934-06-03   PCP: Caryl Bis, MD  Patient is from: Home.  Lives with husband and daughter.  Walker and wheelchair dependent at baseline.  DOA: 02/19/2020 LOS: 3  Brief Narrative / Interim history: 86 year old female with history of A. fib on Eliquis, CKD-3, CVA, CAD, DM-2, systolic CHF and anemia of chronic disease presented to [facility] with multiple episode of hematemesis, and transferred here for ABLA due to GI bleed.  Hgb dropped to 5.9 (baseline about 10.5).  She received 2 units with appropriate response.  Had EGD that demonstrated linear esophageal ulcer with stigmata of recent bleeding, nonbleeding erosions in the gastric body, as well as edema and inflammation of the prepyloric region of the stomach.  She has been started on PPI and Carafate.  GI following.  Hospital course complicated by metabolic encephalopathy.   Subjective: Seen and examined earlier this morning.  No major events overnight of this morning.  She is confused.  Only oriented to self.  Follows some commands.  She responds no to pain.   Objective: Vitals:   02/21/20 0523 02/21/20 1005 02/21/20 2001 02/22/20 0408  BP: (!) 157/84 (!) 151/61 128/60 (!) 165/66  Pulse: 69 (!) 58 63 60  Resp: 15 17 17 17   Temp: 98.5 F (36.9 C) 98.6 F (37 C) 98.6 F (37 C) 98.9 F (37.2 C)  TempSrc: Oral Oral Oral Oral  SpO2: 99% 100% 94% 99%  Weight:        Intake/Output Summary (Last 24 hours) at 02/22/2020 1504 Last data filed at 02/22/2020 1151 Gross per 24 hour  Intake 340 ml  Output 1750 ml  Net -1410 ml   Filed Weights   02/19/20 0720  Weight: 59.4 kg    Examination:  GENERAL: Frail looking elderly female.  No apparent distress.  Nontoxic. HEENT: MMM.  Vision and hearing grossly intact.  NECK: Supple.  No apparent JVD.  RESP:  No IWOB.  Fair aeration bilaterally. CVS:  RRR. Heart sounds normal.  ABD/GI/GU: BS+. Abd soft, NTND.  MSK/EXT:  Moves  extremities. No apparent deformity. No edema.  SKIN: no apparent skin lesion or wound NEURO: Awake.  Oriented only to self.  Follows some commands.  No apparent focal neuro deficit. PSYCH: Calm. Normal affect.  Procedures:  6/7-EGD-acute gastritis, esophageal ulcer with black eschar   Microbiology summarized: COVID-19 PCR negative.  Assessment & Plan: Acute blood loss anemia due to upper GI bleed in patient with history of anemia of chronic disease -Baseline Hgb 13-11> 8.3 (admit)> 5.9>2u> 9.2 -EGD as above. -GI recommendations  -Protonix 40 mg twice daily for 2 months followed by once daily  -Carafate slurry 3 times daily and nightly for 1 week  -Hold Eliquis for 1 week  -Monitor H&H  Acute metabolic encephalopathy-unclear source.  Seems to have baseline dementia with sundowning per daughter.  She is only oriented to self today. -Reorientation and delirium precautions -Discontinue morphine and oxycodone -Scheduled Tylenol with small dose oxycodone as needed  DM-2 with hyperglycemia: On insulin, Metformin and glipizide at home. Recent Labs    02/22/20 0403 02/22/20 0802 02/22/20 1150  GLUCAP 87 97 162*  -Continue current regimen  Persistent Atrial Fibrillation: Rate controlled -Eliquis on hold in the setting of GI bleed  Chronic systolic CHF: EF is 62%.  Euvolemic -Monitor fluid status  AKI on CKD IIIa: AKI resolved. -Continue monitoring  CAD: No anginal symptoms. -Continue beta-blocker and statin -Hold aspirin in the setting of  GI bleed  Hypomagnesemia -Replenish and recheck  Mood disorder: On Wellbutrin, Cymbalta -Resume home medications  Debility-Walker and wheelchair dependent at baseline.  Goal of care: Patient with significant comorbidities as above.  Still full code which would pose more harm than benefit.  Discussed this with patient's daughter and husband over the phone. They would like to keep her full code and discuss this in the  future -Involve palliative care         DVT prophylaxis: SCD in the setting of GI bleed Code Status: Full code Family Communication: Patient and/or RN. Available if any question.  Status is: Inpatient  Remains inpatient appropriate because:Persistent severe electrolyte disturbances, IV treatments appropriate due to intensity of illness or inability to take PO and Inpatient level of care appropriate due to severity of illness   Dispo: The patient is from: Home              Anticipated d/c is to: To be determined              Anticipated d/c date is: 1 day              Patient currently is not medically stable to d/c.       Consultants:  Gastroenterology Palliative medicine   Sch Meds:  Scheduled Meds: . acetaminophen  1,000 mg Oral Q8H  . buPROPion  300 mg Oral Daily  . carvedilol  25 mg Oral BID WC  . DULoxetine  30 mg Oral Daily  . insulin aspart  0-9 Units Subcutaneous Q4H  . pantoprazole  40 mg Intravenous Q12H  . simvastatin  20 mg Oral q1800  . sucralfate  1 g Oral TID WC & HS   Continuous Infusions: . sodium chloride 20 mL/hr at 02/22/20 0652   PRN Meds:.ondansetron **OR** ondansetron (ZOFRAN) IV, oxyCODONE  Antimicrobials: Anti-infectives (From admission, onward)   None       I have personally reviewed the following labs and images: CBC: Recent Labs  Lab 02/19/20 0846 02/19/20 0846 02/20/20 0940 02/20/20 2248 02/21/20 7353 02/21/20 0859 02/21/20 1648 02/22/20 0201 02/22/20 0933  WBC 14.0*  --  9.7  --  8.3  --   --   --  9.2  NEUTROABS 9.3*  --   --   --  5.8  --   --   --   --   HGB 8.3*   < > 5.9*   < > 7.6* 7.9* 8.3* 8.5* 9.2*  HCT 25.9*   < > 18.5*   < > 23.6* 24.7* 25.3* 26.1* 28.1*  MCV 92.2  --  93.4  --  90.8  --   --   --  88.9  PLT 269  --  207  --  167  --   --   --  179   < > = values in this interval not displayed.   BMP &GFR Recent Labs  Lab 02/19/20 0846 02/20/20 0940 02/21/20 0628 02/22/20 0933  NA 135 140 141 138   K 5.3* 3.9 3.3* 3.6  CL 103 108 110 106  CO2 22 22 19* 20*  GLUCOSE 301* 150* 104* 92  BUN 43* 33* 20 13  CREATININE 1.34* 1.26* 1.03* 0.98  CALCIUM 8.4* 8.2* 7.9* 8.3*  MG 1.6*  --   --  1.3*   Estimated Creatinine Clearance: 34.4 mL/min (by C-G formula based on SCr of 0.98 mg/dL). Liver & Pancreas: Recent Labs  Lab 02/19/20 0846  AST 18  ALT 19  ALKPHOS 61  BILITOT 0.7  PROT 4.9*  ALBUMIN 2.2*   No results for input(s): LIPASE, AMYLASE in the last 168 hours. No results for input(s): AMMONIA in the last 168 hours. Diabetic: No results for input(s): HGBA1C in the last 72 hours. Recent Labs  Lab 02/21/20 1959 02/21/20 2343 02/22/20 0403 02/22/20 0802 02/22/20 1150  GLUCAP 120* 107* 87 97 162*   Cardiac Enzymes: No results for input(s): CKTOTAL, CKMB, CKMBINDEX, TROPONINI in the last 168 hours. No results for input(s): PROBNP in the last 8760 hours. Coagulation Profile: Recent Labs  Lab 02/19/20 0846  INR 1.2   Thyroid Function Tests: No results for input(s): TSH, T4TOTAL, FREET4, T3FREE, THYROIDAB in the last 72 hours. Lipid Profile: No results for input(s): CHOL, HDL, LDLCALC, TRIG, CHOLHDL, LDLDIRECT in the last 72 hours. Anemia Panel: No results for input(s): VITAMINB12, FOLATE, FERRITIN, TIBC, IRON, RETICCTPCT in the last 72 hours. Urine analysis:    Component Value Date/Time   COLORURINE YELLOW 11/29/2019 1428   APPEARANCEUR HAZY (A) 11/29/2019 1428   LABSPEC 1.020 11/29/2019 1428   PHURINE 5.0 11/29/2019 1428   GLUCOSEU >=500 (A) 11/29/2019 1428   HGBUR SMALL (A) 11/29/2019 1428   BILIRUBINUR NEGATIVE 11/29/2019 1428   KETONESUR 20 (A) 11/29/2019 1428   PROTEINUR 100 (A) 11/29/2019 1428   UROBILINOGEN 1.0 04/25/2008 1210   NITRITE NEGATIVE 11/29/2019 1428   LEUKOCYTESUR SMALL (A) 11/29/2019 1428   Sepsis Labs: Invalid input(s): PROCALCITONIN, [place]  Microbiology: Recent Results (from the past 240 hour(s))  SARS CORONAVIRUS 2 (TAT 6-24  HRS) Nasopharyngeal Nasopharyngeal Swab     Status: None   Collection Time: 02/20/20 12:29 PM   Specimen: Nasopharyngeal Swab  Result Value Ref Range Status   SARS Coronavirus 2 NEGATIVE NEGATIVE Final    Comment: (NOTE) SARS-CoV-2 target nucleic acids are NOT DETECTED. The SARS-CoV-2 RNA is generally detectable in upper and lower respiratory specimens during the acute phase of infection. Negative results do not preclude SARS-CoV-2 infection, do not rule out co-infections with other pathogens, and should not be used as the sole basis for treatment or other patient management decisions. Negative results must be combined with clinical observations, patient history, and epidemiological information. The expected result is Negative. Fact Sheet for Patients: SugarRoll.be Fact Sheet for Healthcare Providers: https://www.woods-mathews.com/ This test is not yet approved or cleared by the [place] FDA and  has been authorized for detection and/or diagnosis of SARS-CoV-2 by FDA under an Emergency Use Authorization (EUA). This EUA will remain  in effect (meaning this test can be used) for the duration of the COVID-19 declaration under Section 56 4(b)(1) of the Act, 21 U.S.C. section 360bbb-3(b)(1), unless the authorization is terminated or revoked sooner. Performed at [facility], [place] [address], [place],  [address]     Radiology Studies: No results found.    Kaylib Furness T. Hollins  If 7PM-7AM, please contact night-coverage [email or web address] Password [email or web address] 02/22/2020, 3:04 PM

## 2020-02-22 NOTE — Progress Notes (Addendum)
[facility] Gastroenterology Progress Note  Norma [name] 86 y.o. 01-25-1934  CC: Esophageal ulcer  Subjective: Patient denies any abdominal pain.  Denies pain in general.  Remains confused.  Per review of flowsheet, one type 2 melenic stool yesterday.  Per CNA, no bowel movements today.  ROS : Objective: Vital signs in last 24 hours: Vitals:   02/21/20 2001 02/22/20 0408  BP: 128/60 (!) 165/66  Pulse: 63 60  Resp: 17 17  Temp: 98.6 F (37 C) 98.9 F (37.2 C)  SpO2: 94% 99%    Physical Exam:  General:  Lethargic, disoriented, cooperative, no acute distress  Head:  Normocephalic, without obvious abnormality, atraumatic  Eyes:  Conjunctival pallor, EOMs intact   Lungs:   Clear to auscultation bilaterally, respirations unlabored  Heart:  Regular rate and rhythm, S1, S2 normal  Abdomen:   Soft, non-tender, bowel sounds normoactive,  no guarding or peritoneal signs    Extremities: Extremities normal, atraumatic, no  edema  Pulses: 2+ and symmetric    Lab Results: Recent Labs    02/19/20 0846 02/19/20 0846 02/20/20 0940 02/21/20 0628  NA 135   < > 140 141  K 5.3*   < > 3.9 3.3*  CL 103   < > 108 110  CO2 22   < > 22 19*  GLUCOSE 301*   < > 150* 104*  BUN 43*   < > 33* 20  CREATININE 1.34*   < > 1.26* 1.03*  CALCIUM 8.4*   < > 8.2* 7.9*  MG 1.6*  --   --   --    < > = values in this interval not displayed.   Recent Labs    02/19/20 0846  AST 18  ALT 19  ALKPHOS 61  BILITOT 0.7  PROT 4.9*  ALBUMIN 2.2*   Recent Labs    02/19/20 0846 02/19/20 0846 02/20/20 0940 02/20/20 2248 02/21/20 0628 02/21/20 0859 02/21/20 1648 02/22/20 0201  WBC 14.0*   < > 9.7  --  8.3  --   --   --   NEUTROABS 9.3*  --   --   --  5.8  --   --   --   HGB 8.3*   < > 5.9*   < > 7.6*   < > 8.3* 8.5*  HCT 25.9*   < > 18.5*   < > 23.6*   < > 25.3* 26.1*  MCV 92.2   < > 93.4  --  90.8  --   --   --   PLT 269   < > 207  --  167  --   --   --    < > = values in this interval not  displayed.   Recent Labs    02/19/20 0846  LABPROT 14.3  INR 1.2   Assessment: Esophageal ulcer with recent hematemesis in the setting of Eliquis use -EGD 6/7 showed esophageal ulcer with stigmata of recent bleeding and acute gastritis -Hgb 8.5, gradually improving -No further signs of GI bleeding  Plan: -Continue Protonix twice daily for 2 months, followed by once daily therafter -Start Carafate slurry TID and QHS (continue for 1 week duration) -Changed regular diet to soft due to esophageal ulcer with recent bleeding.   -Continue to hold Eliquis for 1 week if OK with primary team -Continue to monitor H&H -Continue supportive care with transfusion as needed to maintain hemoglobin greater than 7  [facility] will sign off. Please contact us if  we can be of any further assistance during this hospital stay.  Salley Slaughter PA-C 02/22/2020, 8:41 AM  Contact #  [phone]

## 2020-02-22 NOTE — Plan of Care (Signed)
  Problem: Education: Goal: Knowledge of General Education information will improve Description: Including pain rating scale, medication(s)/side effects and non-pharmacologic comfort measures Outcome: Progressing   Problem: Clinical Measurements: Goal: Will remain free from infection Outcome: Progressing Goal: Respiratory complications will improve Outcome: Progressing   

## 2020-02-23 LAB — CBC
HCT: 24.6 % — ABNORMAL LOW (ref 36.0–46.0)
Hemoglobin: 8.1 g/dL — ABNORMAL LOW (ref 12.0–15.0)
MCH: 29.5 pg (ref 26.0–34.0)
MCHC: 32.9 g/dL (ref 30.0–36.0)
MCV: 89.5 fL (ref 80.0–100.0)
Platelets: 187 10*3/uL (ref 150–400)
RBC: 2.75 MIL/uL — ABNORMAL LOW (ref 3.87–5.11)
RDW: 14.8 % (ref 11.5–15.5)
WBC: 7.8 10*3/uL (ref 4.0–10.5)
nRBC: 0 % (ref 0.0–0.2)

## 2020-02-23 LAB — RENAL FUNCTION PANEL
Albumin: 2.2 g/dL — ABNORMAL LOW (ref 3.5–5.0)
Anion gap: 7 (ref 5–15)
BUN: 11 mg/dL (ref 8–23)
CO2: 21 mmol/L — ABNORMAL LOW (ref 22–32)
Calcium: 8 mg/dL — ABNORMAL LOW (ref 8.9–10.3)
Chloride: 109 mmol/L (ref 98–111)
Creatinine, Ser: 1.09 mg/dL — ABNORMAL HIGH (ref 0.44–1.00)
GFR calc Af Amer: 54 mL/min — ABNORMAL LOW (ref >60)
GFR calc non Af Amer: 47 mL/min — ABNORMAL LOW (ref >60)
Glucose, Bld: 181 mg/dL — ABNORMAL HIGH (ref 70–99)
Phosphorus: 2.6 mg/dL (ref 2.5–4.6)
Potassium: 3.9 mmol/L (ref 3.5–5.1)
Sodium: 137 mmol/L (ref 135–145)

## 2020-02-23 LAB — GLUCOSE, CAPILLARY
Glucose-Capillary: 147 mg/dL — ABNORMAL HIGH (ref 70–99)
Glucose-Capillary: 170 mg/dL — ABNORMAL HIGH (ref 70–99)
Glucose-Capillary: 176 mg/dL — ABNORMAL HIGH (ref 70–99)
Glucose-Capillary: 178 mg/dL — ABNORMAL HIGH (ref 70–99)

## 2020-02-23 LAB — MAGNESIUM: Magnesium: 2.2 mg/dL (ref 1.7–2.4)

## 2020-02-23 MED ORDER — PANTOPRAZOLE SODIUM 40 MG PO TBEC: DELAYED_RELEASE_TABLET | ORAL | 0 refills | Status: AC

## 2020-02-23 MED ORDER — ACETAMINOPHEN 500 MG PO TABS
1000.0000 mg | ORAL_TABLET | Freq: Three times a day (TID) | ORAL | 0 refills | Status: AC
Start: 2020-02-23 — End: 2020-03-04

## 2020-02-23 MED ORDER — SUCRALFATE 1 GM/10ML PO SUSP: 1.0000 g | Freq: Three times a day (TID) | ORAL | 0 refills | Status: DC

## 2020-02-23 MED ORDER — SUCRALFATE 1 G PO TABS: 1.0000 g | ORAL_TABLET | Freq: Three times a day (TID) | ORAL | 0 refills | Status: AC

## 2020-02-23 MED ORDER — APIXABAN 2.5 MG PO TABS
2.5000 mg | ORAL_TABLET | Freq: Two times a day (BID) | ORAL | 2 refills | Status: AC
Start: 2020-03-01 — End: ?

## 2020-02-23 NOTE — TOC Progression Note (Signed)
Transition of Care [facility]) - Progression Note    Patient Details  Name: Norma [name] MRN: [id number] Date of Birth: 05-12-1934  [facility] CM/SW Contact  Jacalyn Lefevre Edson Snowball, RN Phone Number: 02/23/2020, 2:28 PM  Clinical Narrative:     Prior to admission patient active with [facility] PT/OT. Received resumption of care orders. Cory with [facility] aware.  Expected Discharge Plan: Home/Self Care Barriers to Discharge: Continued Medical Work up  Expected Discharge Plan and Services Expected Discharge Plan: Home/Self Care       Living arrangements for the past 2 months: Single Family Home Expected Discharge Date: 02/23/20               DME Arranged: N/A         HH Arranged: NA           Social Determinants of Health (SDOH) Interventions    Readmission Risk Interventions No flowsheet data found.

## 2020-02-23 NOTE — Plan of Care (Signed)

## 2020-02-23 NOTE — Evaluation (Signed)
Occupational Therapy Evaluation Patient Details Name: GERYL DOHN MRN: [id number] DOB: Oct 13, 1933 Today's Date: 02/23/2020    History of Present Illness RINIYAH SPEICH is a 86 y.o. female admitted for hematemesis s/p EGD on 6/7, acute gastritis, and esophageal ulcer with black eschar. PMHx significant for CKD III, CVA, CMO, DM II, and multiple cardiac comorbidities including; A-fib and CAD.    Clinical Impression   Patient and grandson met upon entry. Prior to hospital admission, patient was living in a private residence with family and was requiring assistance for all BADLs/IADLs, functional transfers, and mobility short distances with use of RW. Patient currently functioning below baseline requiring Max A for BADLs grossly. Patient also requiring Max A for sit to stand transfers secondary to generalized weakness. Patient/family declined SNF for short-term rehab secondary to previous negative experience. Patient to d/c home with family and 24 hour supervision/assist. Patient would benefit from [facility] to improve independence with self-care tasks and decrease caregiver burden.     Follow Up Recommendations  Home health OT    Equipment Recommendations  None recommended by OT (Patient has all necessary DME)    Recommendations for Other Services       Precautions / Restrictions Precautions Precautions: Fall Restrictions Weight Bearing Restrictions: No      Mobility Bed Mobility Overal bed mobility: Needs Assistance Bed Mobility: Supine to Sit;Sit to Supine     Supine to sit: Max assist Sit to supine: Mod assist;+2 for physical assistance   General bed mobility comments: MaxA for BLE and trunk management.   Transfers Overall transfer level: Needs assistance Equipment used: Rolling walker (2 wheeled);None Transfers: Sit to/from Stand Sit to Stand: Max assist         General transfer comment: MaxA to stand x 2 from edge of bed with both walker and face to face transfer;  pt unable to achieve fully upright.  Tendency for anterior foot slide and posterior lean    Balance Overall balance assessment: Needs assistance Sitting-balance support: Feet supported Sitting balance-Leahy Scale: Poor Sitting balance - Comments: posterior/right lateral lean, requiring supervision-min guard assist Postural control: Posterior lean;Right lateral lean Standing balance support: No upper extremity supported Standing balance-Leahy Scale: Poor Standing balance comment: maxA for standing balance                           ADL either performed or assessed with clinical judgement   ADL Overall ADL's : Needs assistance/impaired     Grooming: Moderate assistance;Sitting           Upper Body Dressing : Maximal assistance;Sitting Upper Body Dressing Details (indicate cue type and reason): Increased time/effort seated EOB Lower Body Dressing: Maximal assistance;Sit to/from stand;+2 for physical assistance Lower Body Dressing Details (indicate cue type and reason): With increased time/effort and use of RW                     Vision Baseline Vision/History: No visual deficits Patient Visual Report: No change from baseline Vision Assessment?: Yes;No apparent visual deficits     Perception     Praxis      Pertinent Vitals/Pain Pain Assessment: No/denies pain     Hand Dominance Right   Extremity/Trunk Assessment Upper Extremity Assessment Upper Extremity Assessment: Defer to OT evaluation   Lower Extremity Assessment Lower Extremity Assessment: Generalized weakness   Cervical / Trunk Assessment Cervical / Trunk Assessment: Kyphotic   Communication Communication Communication: No difficulties  Cognition Arousal/Alertness: Awake/alert Behavior During Therapy: Flat affect Overall Cognitive Status: History of cognitive impairments - at baseline                                 General Comments: Baseline dementia. Pt with  inconsistent command following, answers some questions appropriately, pt grandson available to provide PLOF   General Comments       Exercises     Shoulder Instructions      Home Living Family/patient expects to be discharged to:: Private residence Living Arrangements: Spouse/significant other Available Help at Discharge: Family;Available 24 hours/day;Personal care attendant Type of Home: House Home Access: Ramped entrance     Home Layout: One level     Bathroom Shower/Tub: Tub/shower unit;Other (comment) (Patient sponge bathes seated EOB)   Bathroom Toilet: Standard     Home Equipment: Walker - 2 wheels;Shower seat;Bedside commode;Wheelchair - manual;Cane - single point          Prior Functioning/Environment Level of Independence: Needs assistance  Gait / Transfers Assistance Needed: Assisted by spouse for transfers and short household distances using RW, w/c mainly for mobility ADL's / Homemaking Assistance Needed: MaxA for bathing, dressing. Caregiver 8-2 daily, Pt daughter during afternoon, spouse/grandson evening            OT Problem List: Decreased strength;Decreased range of motion;Decreased activity tolerance;Impaired balance (sitting and/or standing);Decreased coordination;Decreased cognition;Impaired UE functional use      OT Treatment/Interventions:      OT Goals(Current goals can be found in the care plan section) Acute Rehab OT Goals Patient Stated Goal: To return home  OT Frequency:     Barriers to D/C:            Co-evaluation              AM-PAC OT "6 Clicks" Daily Activity     Outcome Measure Help from another person eating meals?: A Lot Help from another person taking care of personal grooming?: A Lot Help from another person toileting, which includes using toliet, bedpan, or urinal?: A Lot Help from another person bathing (including washing, rinsing, drying)?: A Lot Help from another person to put on and taking off regular upper  body clothing?: A Lot Help from another person to put on and taking off regular lower body clothing?: A Lot 6 Click Score: 12   End of Session Equipment Utilized During Treatment: Gait belt;Rolling walker  Activity Tolerance: Patient limited by fatigue Patient left: in chair;with call bell/phone within reach;with chair alarm set  OT Visit Diagnosis: Unsteadiness on feet (R26.81);Other abnormalities of gait and mobility (R26.89);Muscle weakness (generalized) (M62.81)                Time: 5053-9767 OT Time Calculation (min): 26 min Charges:  OT General Charges $OT Visit: 1 Visit OT Evaluation $OT Eval Moderate Complexity: 1 Mod OT Treatments $Self Care/Home Management : 8-22 mins  Yissel Habermehl H. OTR/L Supplemental OT, Department of rehab services [phone]  Ramirez Fullbright R H. 02/23/2020, 3:39 PM

## 2020-02-23 NOTE — Evaluation (Signed)
Physical Therapy Evaluation Patient Details Name: Norma [name] MRN: [id number] DOB: May 03, 1934 Today's Date: 02/23/2020   History of Present Illness  Norma [name] is a 86 y.o. female admitted for hematemesis s/p EGD on 6/7, acute gastritis, and esophageal ulcer with black eschar. PMHx significant for CKD III, CVA, CMO, DM II, and multiple cardiac comorbidities including; A-fib and CAD.   Clinical Impression  Prior to admission, pt lives with her spouse, has 24/7 supervision/assist between family members/caregivers, requires assist with transfers and uses wheelchair vs walker for mobility, and mostly dependent for ADL's. Pt presents with baseline cognitive impairments, generalized weakness, poor balance and decreased activity tolerance. Requiring maximal assist for bed mobility and transfers; unable to ambulate due to retropulsion and fatigue. Pt/pt grandson refusing SNF due to prior bad experience. Feel she would benefit from a hoyer lift for transfers, but unfortunately pt grandson states it would not fit in their household. Pt grandson feels comfortable with transfer technique; PT provided a visual demonstration using a gait belt for proper body mechanics. I recommended wheelchair mobility only (no ambulation) due to pt presenting as a high fall risk. Pt grandson verbalized understanding with no further questions. Pt would benefit from HHPT to address deficits and maximize functional mobility.     Follow Up Recommendations Home health PT;Supervision/Assistance - 24 hour    Equipment Recommendations  None recommended by PT (well equipped, also has gait belt)   Recommendations for Other Services       Precautions / Restrictions Precautions Precautions: Fall Restrictions Weight Bearing Restrictions: No      Mobility  Bed Mobility Overal bed mobility: Needs Assistance Bed Mobility: Supine to Sit;Sit to Supine     Supine to sit: Max assist Sit to supine: Max assist   General  bed mobility comments: MaxA for BLE and trunk management.   Transfers Overall transfer level: Needs assistance Equipment used: Rolling walker (2 wheeled);None Transfers: Sit to/from Stand Sit to Stand: Max assist         General transfer comment: MaxA to stand x 2 from edge of bed with both walker and face to face transfer; pt unable to achieve fully upright.  Tendency for anterior foot slide and posterior lean  Ambulation/Gait             General Gait Details: unable  Stairs            Wheelchair Mobility    Modified Rankin (Stroke Patients Only)       Balance Overall balance assessment: Needs assistance Sitting-balance support: Feet supported Sitting balance-Leahy Scale: Poor Sitting balance - Comments: posterior/right lateral lean, requiring supervision-min guard assist Postural control: Posterior lean;Right lateral lean Standing balance support: No upper extremity supported Standing balance-Leahy Scale: Poor Standing balance comment: maxA for standing balance                             Pertinent Vitals/Pain Pain Assessment: No/denies pain    Home Living Family/patient expects to be discharged to:: Private residence Living Arrangements: Spouse/significant other Available Help at Discharge: Family;Available 24 hours/day;Personal care attendant Type of Home: House Home Access: Ramped entrance     Home Layout: One level Home Equipment: [place] - 2 wheels;Shower seat;Bedside commode;Wheelchair - manual;Cane - single point      Prior Function Level of Independence: Needs assistance   Gait / Transfers Assistance Needed: Assisted by spouse for transfers and short household distances using RW, w/c mainly for mobility  ADL's / Homemaking Assistance Needed: MaxA for bathing, dressing. Caregiver 8-2 daily, Pt daughter during afternoon, spouse/grandson evening        Hand Dominance   Dominant Hand: Right    Extremity/Trunk Assessment    Upper Extremity Assessment Upper Extremity Assessment: Defer to OT evaluation    Lower Extremity Assessment Lower Extremity Assessment: Generalized weakness    Cervical / Trunk Assessment Cervical / Trunk Assessment: Kyphotic  Communication   Communication: No difficulties  Cognition Arousal/Alertness: Awake/alert Behavior During Therapy: Flat affect Overall Cognitive Status: History of cognitive impairments - at baseline                                 General Comments: Baseline dementia. Pt with inconsistent command following, answers some questions appropriately, pt grandson available to provide PLOF      General Comments      Exercises     Assessment/Plan    PT Assessment Patient needs continued PT services  PT Problem List Decreased strength;Decreased activity tolerance;Decreased balance;Decreased mobility;Decreased cognition;Decreased safety awareness       PT Treatment Interventions DME instruction;Gait training;Functional mobility training;Therapeutic exercise;Therapeutic activities;Balance training;Patient/family education;Wheelchair mobility training    PT Goals (Current goals can be found in the Care Plan section)  Acute Rehab PT Goals Patient Stated Goal: go home PT Goal Formulation: With patient/family Time For Goal Achievement: 03/08/20 Potential to Achieve Goals: Fair    Frequency Min 3X/week   Barriers to discharge        Co-evaluation               AM-PAC PT "6 Clicks" Mobility  Outcome Measure Help needed turning from your back to your side while in a flat bed without using bedrails?: A Lot Help needed moving from lying on your back to sitting on the side of a flat bed without using bedrails?: Total Help needed moving to and from a bed to a chair (including a wheelchair)?: Total Help needed standing up from a chair using your arms (e.g., wheelchair or bedside chair)?: Total Help needed to walk in hospital room?: Total Help  needed climbing 3-5 steps with a railing? : Total 6 Click Score: 7    End of Session Equipment Utilized During Treatment: Gait belt Activity Tolerance: Patient limited by fatigue Patient left: in bed;with call bell/phone within reach;with bed alarm set;Other (comment);with family/visitor present (with OT) Nurse Communication: Mobility status PT Visit Diagnosis: Unsteadiness on feet (R26.81);Other abnormalities of gait and mobility (R26.89);Muscle weakness (generalized) (M62.81);Difficulty in walking, not elsewhere classified (R26.2)    Time: 6384-5364 PT Time Calculation (min) (ACUTE ONLY): 30 min   Charges:   PT Evaluation $PT Eval Moderate Complexity: 1 Mod PT Treatments $Self Care/Home Management: 8-22          Wyona Almas, PT, DPT Acute Rehabilitation Services Pager [phone] Office [phone]   Deno Etienne 02/23/2020, 2:37 PM

## 2020-02-23 NOTE — Discharge Summary (Signed)
Physician Discharge Summary  Norma [name] WGY:[id number] DOB: March 15, 1934 DOA: 02/19/2020  PCP: Caryl Bis, MD  Admit date: 02/19/2020 Discharge date: 02/23/2020  Admitted From: Home. Disposition: Home  Recommendations for Outpatient Follow-up:  1. Follow ups as below. 2. Please obtain CBC/BMP/Mag at follow up 3. Recommend outpatient palliative care follow-up 4. Please follow up on the following pending results: None  Home Health: PT/OT Equipment/Devices: Patient has appropriate DME  Discharge Condition: Stable CODE STATUS: Full code   Follow-up Information    Caryl Bis, MD. Schedule an appointment as soon as possible for a visit in 1 week(s).   Specialty: Family Medicine Contact information: [place] [address] [phone]        [facility], [facility] Follow up.   Specialty: Home Health Services Contact information: [place] [address] [phone]                Hospital Course: 86 year old female with history of A. fib on Eliquis, CKD-3, CVA, CAD, DM-2, systolic CHF and anemia of chronic disease presented to [facility] with multiple episode of hematemesis, and transferred here for ABLA due to GI bleed.  Hgb dropped to 5.9 (baseline about 10.5).  She received 2 units with appropriate response.  Had EGD that demonstrated linear esophageal ulcer with stigmata of recent bleeding, nonbleeding erosions in the gastric body, as well as edema and inflammation of the prepyloric region of the stomach.  She has been started on PPI and Carafate. Hgb remained stable in the range of 8-9.    GI recommended p.o. Protonix 40 mg twice daily for 2 months followed by 40 mg daily, and Carafate 1 g 4 times daily for 1 week.  Eliquis on hold for 1 week.  She will be resuming at reduced dose of 2.5 mg twice daily given her age and weight.  Hospital course complicated by metabolic encephalopathy like due to delirium.delirium that  has improved.   Home health PT/OT ordered as recommended by therapy.  Patient has appropriate DME  See individual problem list below for more hospital course. Discharge Diagnoses:  Acute blood loss anemia due to upper GI bleed in patient with history of anemia of chronic disease -Baseline Hgb 13-11> 8.3 (admit)> 5.9>2u> 7.6> 8.5> 8.1 -EGD as above. -GI recommendations             -Protonix 40 mg twice daily for 2 months followed by once daily             -Carafate slurry 3 times daily and nightly for 1 week             -Hold Eliquis for 1 week.  She will resume 2.5 mg twice daily after 1 week.             -Check CBC in 1 week  Acute metabolic encephalopathy-unclear source.  Seems to have baseline dementia with sundowning per daughter.  She is oriented to self, place and situation on the day of discharge. -Recommended scheduled Tylenol for pain and avoiding sedating medications.  DM-2 with hyperglycemia: On insulin, Metformin and glipizide at home. -Discharged on home medications.  Persistent Atrial Fibrillation: Rate controlled -Patient to resume Eliquis at reduced dose of 2.5 mg twice daily in 1 week  Chronic systolic CHF: EF is 33%.  Euvolemic  AKI on CKD IIIa: AKI resolved. -Recheck BMP in 1 week  CAD: No anginal symptoms. -Continue beta-blocker and statin -Discontinued aspirin.  High  risk for bleeding with Eliquis  Hypomagnesemia: Resolved.  Mood disorder: On Wellbutrin and Cymbalta -Resume home medications  Debility-Walker and wheelchair dependent at baseline. -Home health PT ordered.  Goal of care: Patient with significant comorbidities as above.  Still full code which would pose more harm than benefit.  Discussed this with patient's daughter and husband over the phone. They would like to keep her full code and discuss this in the future -Recommend outpatient follow-up with palliative care   Family communication: Updated patient's daughter over the phone  on the day of discharge.               Discharge Exam: Vitals:   02/23/20 0434 02/23/20 1451  BP: 139/68 135/71  Pulse: 64 (!) 58  Resp: 16 16  Temp: 98.1 F (36.7 C) 98.3 F (36.8 C)  SpO2: 99% 100%    GENERAL: No apparent distress.  Nontoxic. HEENT: MMM.  Vision and hearing grossly intact.  NECK: Supple.  No apparent JVD.  RESP:  No IWOB.  Fair aeration bilaterally. CVS:  RRR. Heart sounds normal.  ABD/GI/GU: Bowel sounds present. Soft. Non tender.  MSK/EXT:  Moves extremities. No apparent deformity. No edema.  SKIN: no apparent skin lesion or wound NEURO: Awake and oriented to self, place and situation.  No apparent focal neuro deficit. PSYCH: Calm. Normal affect.  Discharge Instructions  Discharge Instructions    Call MD for:   Complete by: As directed    Vomiting blood or blood in the stool   Call MD for:  extreme fatigue   Complete by: As directed    Call MD for:  persistant dizziness or light-headedness   Complete by: As directed    Diet general   Complete by: As directed    Discharge instructions   Complete by: As directed    It has been a pleasure taking care of you! You were hospitalized and treated for gastrointestinal bleed likely due to esophageal ulcer.  We have stopped your blood thinner (Eliquis) for 1 week, and started you on medication to treat the ulcer.  We may have started you on other new medications or made some changes to your home medications during this hospitalization. Please review your new medication list and the directions carefully before you take them.  Please go to your hospital follow-up appointments or call to reschedule as recommended.    Take care,   Increase activity slowly   Complete by: As directed      Allergies as of 02/23/2020   No Known Allergies     Medication List    TAKE these medications   acetaminophen 500 MG tablet Commonly known as: TYLENOL Take 2 tablets (1,000 mg total) by mouth every 8 (eight)  hours for 10 days.   apixaban 2.5 MG Tabs tablet Commonly known as: ELIQUIS Take 1 tablet (2.5 mg total) by mouth 2 (two) times daily. Start taking on: March 01, 2020 What changed:   medication strength  how much to take  These instructions start on March 01, 2020. If you are unsure what to do until then, ask your doctor or other care provider.   buPROPion 300 MG 24 hr tablet Commonly known as: WELLBUTRIN XL Take 300 mg by mouth daily.   carvedilol 25 MG tablet Commonly known as: COREG Take 1 tablet (25 mg total) by mouth 2 (two) times daily with a meal.   cholecalciferol 25 MCG (1000 UNIT) tablet Commonly known as: VITAMIN D Take 2,000 Units by mouth  daily.   Diaper Rash 10 % Crea Generic drug: ZINC OXIDE (TOPICAL) Apply 1 application topically daily as needed (skin).   DULoxetine 60 MG capsule Commonly known as: CYMBALTA Take 60 mg by mouth daily.   ergocalciferol 1.25 MG (50000 UT) capsule Commonly known as: VITAMIN D2 Take 50,000 Units by mouth once a week.   glipiZIDE 2.5 MG 24 hr tablet Commonly known as: GLUCOTROL XL Take 2.5 mg by mouth daily with breakfast.   insulin detemir 100 UNIT/ML injection Commonly known as: LEVEMIR Inject 0.1 mLs (10 Units total) into the skin at bedtime. What changed:   how much to take  when to take this  reasons to take this  additional instructions   losartan 25 MG tablet Commonly known as: COZAAR Take 25 mg by mouth daily.   oxyCODONE-acetaminophen 10-325 MG tablet Commonly known as: PERCOCET Take 1 tablet by mouth 4 (four) times daily as needed for pain.   pantoprazole 40 MG tablet Commonly known as: Protonix Take 1 tablet (40 mg total) by mouth 2 (two) times daily for 60 days, THEN 1 tablet (40 mg total) daily. Start taking on: February 23, 2020   saccharomyces boulardii 250 MG capsule Commonly known as: Florastor Take 1 capsule (250 mg total) 2 (two) times daily by mouth. What changed: when to take this     simvastatin 20 MG tablet Commonly known as: ZOCOR Take 20 mg by mouth daily.   sucralfate 1 g tablet Commonly known as: Carafate Take 1 tablet (1 g total) by mouth 4 (four) times daily -  with meals and at bedtime for 7 days.       Consultations:  Gastroenterology  Procedures/Studies:  6/7-EGD-acute gastritis, esophageal ulcer with black eschar    No results found.     The results of significant diagnostics from this hospitalization (including imaging, microbiology, ancillary and laboratory) are listed below for reference.     Microbiology: Recent Results (from the past 240 hour(s))  SARS CORONAVIRUS 2 (TAT 6-24 HRS) Nasopharyngeal Nasopharyngeal Swab     Status: None   Collection Time: 02/20/20 12:29 PM   Specimen: Nasopharyngeal Swab  Result Value Ref Range Status   SARS Coronavirus 2 NEGATIVE NEGATIVE Final    Comment: (NOTE) SARS-CoV-2 target nucleic acids are NOT DETECTED. The SARS-CoV-2 RNA is generally detectable in upper and lower respiratory specimens during the acute phase of infection. Negative results do not preclude SARS-CoV-2 infection, do not rule out co-infections with other pathogens, and should not be used as the sole basis for treatment or other patient management decisions. Negative results must be combined with clinical observations, patient history, and epidemiological information. The expected result is Negative. Fact Sheet for Patients: SugarRoll.be Fact Sheet for Healthcare Providers: https://www.woods-mathews.com/ This test is not yet approved or cleared by the [place] FDA and  has been authorized for detection and/or diagnosis of SARS-CoV-2 by FDA under an Emergency Use Authorization (EUA). This EUA will remain  in effect (meaning this test can be used) for the duration of the COVID-19 declaration under Section 56 4(b)(1) of the Act, 21 U.S.C. section 360bbb-3(b)(1), unless the  authorization is terminated or revoked sooner. Performed at [facility], [place] [address] [place], [place] [address]      Labs: BNP (last 3 results) Recent Labs    11/29/19 1142  BNP 027.2*   Basic Metabolic Panel: Recent Labs  Lab 02/19/20 0846 02/20/20 0940 02/21/20 0628 02/22/20 0933 02/23/20 0210  NA 135 140 141 138  137  K 5.3* 3.9 3.3* 3.6 3.9  CL 103 108 110 106 109  CO2 22 22 19* 20* 21*  GLUCOSE 301* 150* 104* 92 181*  BUN 43* 33* 20 13 11   CREATININE 1.34* 1.26* 1.03* 0.98 1.09*  CALCIUM 8.4* 8.2* 7.9* 8.3* 8.0*  MG 1.6*  --   --  1.3* 2.2  PHOS  --   --   --   --  2.6   Liver Function Tests: Recent Labs  Lab 02/19/20 0846 02/23/20 0210  AST 18  --   ALT 19  --   ALKPHOS 61  --   BILITOT 0.7  --   PROT 4.9*  --   ALBUMIN 2.2* 2.2*   No results for input(s): LIPASE, AMYLASE in the last 168 hours. No results for input(s): AMMONIA in the last 168 hours. CBC: Recent Labs  Lab 02/19/20 0846 02/19/20 0846 02/20/20 0940 02/20/20 2248 02/21/20 2683 02/21/20 4196 02/21/20 0859 02/21/20 1648 02/22/20 0201 02/22/20 0933 02/23/20 0210  WBC 14.0*  --  9.7  --  8.3  --   --   --   --  9.2 7.8  NEUTROABS 9.3*  --   --   --  5.8  --   --   --   --   --   --   HGB 8.3*   < > 5.9*   < > 7.6*   < > 7.9* 8.3* 8.5* 9.2* 8.1*  HCT 25.9*   < > 18.5*   < > 23.6*   < > 24.7* 25.3* 26.1* 28.1* 24.6*  MCV 92.2  --  93.4  --  90.8  --   --   --   --  88.9 89.5  PLT 269  --  207  --  167  --   --   --   --  179 187   < > = values in this interval not displayed.   Cardiac Enzymes: No results for input(s): CKTOTAL, CKMB, CKMBINDEX, TROPONINI in the last 168 hours. BNP: Invalid input(s): POCBNP CBG: Recent Labs  Lab 02/22/20 2036 02/23/20 0026 02/23/20 0429 02/23/20 0803 02/23/20 1207  GLUCAP 203* 147* 170* 176* 178*   D-Dimer No results for input(s): DDIMER in the last 72 hours. Hgb A1c No results for input(s): HGBA1C in the last 72 hours. Lipid  Profile No results for input(s): CHOL, HDL, LDLCALC, TRIG, CHOLHDL, LDLDIRECT in the last 72 hours. Thyroid function studies No results for input(s): TSH, T4TOTAL, T3FREE, THYROIDAB in the last 72 hours.  Invalid input(s): FREET3 Anemia work up No results for input(s): VITAMINB12, FOLATE, FERRITIN, TIBC, IRON, RETICCTPCT in the last 72 hours. Urinalysis    Component Value Date/Time   COLORURINE YELLOW 11/29/2019 1428   APPEARANCEUR HAZY (A) 11/29/2019 1428   LABSPEC 1.020 11/29/2019 1428   PHURINE 5.0 11/29/2019 1428   GLUCOSEU >=500 (A) 11/29/2019 1428   HGBUR SMALL (A) 11/29/2019 1428   BILIRUBINUR NEGATIVE 11/29/2019 1428   KETONESUR 20 (A) 11/29/2019 1428   PROTEINUR 100 (A) 11/29/2019 1428   UROBILINOGEN 1.0 04/25/2008 1210   NITRITE NEGATIVE 11/29/2019 1428   LEUKOCYTESUR SMALL (A) 11/29/2019 1428   Sepsis Labs Invalid input(s): PROCALCITONIN,  WBC,  LACTICIDVEN   Time coordinating discharge: 35 minutes  SIGNED:  Mercy Riding, MD  [facility] 02/23/2020, 4:17 PM  If 7PM-7AM, please contact night-coverage [email or web address] Password [email or web address]

## 2020-02-28 DIAGNOSIS — I428 Other cardiomyopathies: Secondary | ICD-10-CM | POA: Diagnosis not present

## 2020-02-28 DIAGNOSIS — I509 Heart failure, unspecified: Secondary | ICD-10-CM | POA: Diagnosis not present

## 2020-02-28 DIAGNOSIS — Z7901 Long term (current) use of anticoagulants: Secondary | ICD-10-CM | POA: Diagnosis not present

## 2020-02-28 DIAGNOSIS — I130 Hypertensive heart and chronic kidney disease with heart failure and stage 1 through stage 4 chronic kidney disease, or unspecified chronic kidney disease: Secondary | ICD-10-CM | POA: Diagnosis not present

## 2020-02-28 DIAGNOSIS — J449 Chronic obstructive pulmonary disease, unspecified: Secondary | ICD-10-CM | POA: Diagnosis not present

## 2020-02-28 DIAGNOSIS — I69351 Hemiplegia and hemiparesis following cerebral infarction affecting right dominant side: Secondary | ICD-10-CM | POA: Diagnosis not present

## 2020-02-28 DIAGNOSIS — Z794 Long term (current) use of insulin: Secondary | ICD-10-CM | POA: Diagnosis not present

## 2020-02-28 DIAGNOSIS — E1165 Type 2 diabetes mellitus with hyperglycemia: Secondary | ICD-10-CM | POA: Diagnosis not present

## 2020-02-28 DIAGNOSIS — I110 Hypertensive heart disease with heart failure: Secondary | ICD-10-CM | POA: Diagnosis not present

## 2020-02-28 DIAGNOSIS — S81802D Unspecified open wound, left lower leg, subsequent encounter: Secondary | ICD-10-CM | POA: Diagnosis not present

## 2020-02-28 DIAGNOSIS — Z7902 Long term (current) use of antithrombotics/antiplatelets: Secondary | ICD-10-CM | POA: Diagnosis not present

## 2020-02-28 DIAGNOSIS — K739 Chronic hepatitis, unspecified: Secondary | ICD-10-CM | POA: Diagnosis not present

## 2020-02-28 DIAGNOSIS — E1151 Type 2 diabetes mellitus with diabetic peripheral angiopathy without gangrene: Secondary | ICD-10-CM | POA: Diagnosis not present

## 2020-02-28 DIAGNOSIS — K2100 Gastro-esophageal reflux disease with esophagitis, without bleeding: Secondary | ICD-10-CM | POA: Diagnosis not present

## 2020-02-28 DIAGNOSIS — N390 Urinary tract infection, site not specified: Secondary | ICD-10-CM | POA: Diagnosis not present

## 2020-02-28 DIAGNOSIS — E1122 Type 2 diabetes mellitus with diabetic chronic kidney disease: Secondary | ICD-10-CM | POA: Diagnosis not present

## 2020-02-28 DIAGNOSIS — Z515 Encounter for palliative care: Secondary | ICD-10-CM | POA: Diagnosis not present

## 2020-02-28 DIAGNOSIS — I5042 Chronic combined systolic (congestive) and diastolic (congestive) heart failure: Secondary | ICD-10-CM | POA: Diagnosis not present

## 2020-02-28 DIAGNOSIS — D692 Other nonthrombocytopenic purpura: Secondary | ICD-10-CM | POA: Diagnosis not present

## 2020-02-28 DIAGNOSIS — F0390 Unspecified dementia without behavioral disturbance: Secondary | ICD-10-CM | POA: Diagnosis not present

## 2020-02-28 DIAGNOSIS — Z8719 Personal history of other diseases of the digestive system: Secondary | ICD-10-CM | POA: Diagnosis not present

## 2020-02-28 DIAGNOSIS — N183 Chronic kidney disease, stage 3 unspecified: Secondary | ICD-10-CM | POA: Diagnosis not present

## 2020-02-28 DIAGNOSIS — F331 Major depressive disorder, recurrent, moderate: Secondary | ICD-10-CM | POA: Diagnosis not present

## 2020-02-28 DIAGNOSIS — I255 Ischemic cardiomyopathy: Secondary | ICD-10-CM | POA: Diagnosis not present

## 2020-02-28 DIAGNOSIS — R569 Unspecified convulsions: Secondary | ICD-10-CM | POA: Diagnosis not present

## 2020-02-29 DIAGNOSIS — Z23 Encounter for immunization: Secondary | ICD-10-CM | POA: Diagnosis not present

## 2020-03-02 DIAGNOSIS — I428 Other cardiomyopathies: Secondary | ICD-10-CM | POA: Diagnosis not present

## 2020-03-02 DIAGNOSIS — N390 Urinary tract infection, site not specified: Secondary | ICD-10-CM | POA: Diagnosis not present

## 2020-03-02 DIAGNOSIS — I130 Hypertensive heart and chronic kidney disease with heart failure and stage 1 through stage 4 chronic kidney disease, or unspecified chronic kidney disease: Secondary | ICD-10-CM | POA: Diagnosis not present

## 2020-03-02 DIAGNOSIS — N183 Chronic kidney disease, stage 3 unspecified: Secondary | ICD-10-CM | POA: Diagnosis not present

## 2020-03-02 DIAGNOSIS — I5042 Chronic combined systolic (congestive) and diastolic (congestive) heart failure: Secondary | ICD-10-CM | POA: Diagnosis not present

## 2020-03-02 DIAGNOSIS — I69351 Hemiplegia and hemiparesis following cerebral infarction affecting right dominant side: Secondary | ICD-10-CM | POA: Diagnosis not present

## 2020-03-02 DIAGNOSIS — I255 Ischemic cardiomyopathy: Secondary | ICD-10-CM | POA: Diagnosis not present

## 2020-03-02 DIAGNOSIS — R569 Unspecified convulsions: Secondary | ICD-10-CM | POA: Diagnosis not present

## 2020-03-02 DIAGNOSIS — E1122 Type 2 diabetes mellitus with diabetic chronic kidney disease: Secondary | ICD-10-CM | POA: Diagnosis not present

## 2020-03-03 ENCOUNTER — Other Ambulatory Visit (HOSPITAL_COMMUNITY): Payer: Self-pay | Admitting: Physician Assistant

## 2020-03-05 ENCOUNTER — Other Ambulatory Visit: Payer: Self-pay | Admitting: *Deleted

## 2020-03-05 DIAGNOSIS — I69351 Hemiplegia and hemiparesis following cerebral infarction affecting right dominant side: Secondary | ICD-10-CM | POA: Diagnosis not present

## 2020-03-05 DIAGNOSIS — N390 Urinary tract infection, site not specified: Secondary | ICD-10-CM | POA: Diagnosis not present

## 2020-03-05 DIAGNOSIS — N183 Chronic kidney disease, stage 3 unspecified: Secondary | ICD-10-CM | POA: Diagnosis not present

## 2020-03-05 DIAGNOSIS — I255 Ischemic cardiomyopathy: Secondary | ICD-10-CM | POA: Diagnosis not present

## 2020-03-05 DIAGNOSIS — I5042 Chronic combined systolic (congestive) and diastolic (congestive) heart failure: Secondary | ICD-10-CM | POA: Diagnosis not present

## 2020-03-05 DIAGNOSIS — E1122 Type 2 diabetes mellitus with diabetic chronic kidney disease: Secondary | ICD-10-CM | POA: Diagnosis not present

## 2020-03-05 DIAGNOSIS — I130 Hypertensive heart and chronic kidney disease with heart failure and stage 1 through stage 4 chronic kidney disease, or unspecified chronic kidney disease: Secondary | ICD-10-CM | POA: Diagnosis not present

## 2020-03-05 DIAGNOSIS — R569 Unspecified convulsions: Secondary | ICD-10-CM | POA: Diagnosis not present

## 2020-03-05 DIAGNOSIS — I428 Other cardiomyopathies: Secondary | ICD-10-CM | POA: Diagnosis not present

## 2020-03-05 MED ORDER — CARVEDILOL 25 MG PO TABS: 25.0000 mg | ORAL_TABLET | Freq: Two times a day (BID) | ORAL | 6 refills | Status: AC

## 2020-03-08 DIAGNOSIS — J90 Pleural effusion, not elsewhere classified: Secondary | ICD-10-CM | POA: Diagnosis not present

## 2020-03-08 DIAGNOSIS — Z8673 Personal history of transient ischemic attack (TIA), and cerebral infarction without residual deficits: Secondary | ICD-10-CM | POA: Diagnosis not present

## 2020-03-08 DIAGNOSIS — G319 Degenerative disease of nervous system, unspecified: Secondary | ICD-10-CM | POA: Diagnosis not present

## 2020-03-08 DIAGNOSIS — F419 Anxiety disorder, unspecified: Secondary | ICD-10-CM | POA: Diagnosis not present

## 2020-03-08 DIAGNOSIS — E119 Type 2 diabetes mellitus without complications: Secondary | ICD-10-CM | POA: Diagnosis not present

## 2020-03-08 DIAGNOSIS — I6782 Cerebral ischemia: Secondary | ICD-10-CM | POA: Diagnosis not present

## 2020-03-08 DIAGNOSIS — N3000 Acute cystitis without hematuria: Secondary | ICD-10-CM | POA: Diagnosis not present

## 2020-03-08 DIAGNOSIS — R569 Unspecified convulsions: Secondary | ICD-10-CM | POA: Diagnosis not present

## 2020-03-08 DIAGNOSIS — J189 Pneumonia, unspecified organism: Secondary | ICD-10-CM | POA: Diagnosis not present

## 2020-03-08 DIAGNOSIS — J3489 Other specified disorders of nose and nasal sinuses: Secondary | ICD-10-CM | POA: Diagnosis not present

## 2020-03-08 DIAGNOSIS — R404 Transient alteration of awareness: Secondary | ICD-10-CM | POA: Diagnosis not present

## 2020-03-08 DIAGNOSIS — G8929 Other chronic pain: Secondary | ICD-10-CM | POA: Diagnosis not present

## 2020-03-08 DIAGNOSIS — J320 Chronic maxillary sinusitis: Secondary | ICD-10-CM | POA: Diagnosis not present

## 2020-03-08 DIAGNOSIS — J180 Bronchopneumonia, unspecified organism: Secondary | ICD-10-CM | POA: Diagnosis not present

## 2020-03-08 DIAGNOSIS — N3090 Cystitis, unspecified without hematuria: Secondary | ICD-10-CM | POA: Diagnosis not present

## 2020-03-08 DIAGNOSIS — I10 Essential (primary) hypertension: Secondary | ICD-10-CM | POA: Diagnosis not present

## 2020-03-08 DIAGNOSIS — K219 Gastro-esophageal reflux disease without esophagitis: Secondary | ICD-10-CM | POA: Diagnosis not present

## 2020-03-09 DIAGNOSIS — E1122 Type 2 diabetes mellitus with diabetic chronic kidney disease: Secondary | ICD-10-CM | POA: Diagnosis not present

## 2020-03-09 DIAGNOSIS — N183 Chronic kidney disease, stage 3 unspecified: Secondary | ICD-10-CM | POA: Diagnosis not present

## 2020-03-09 DIAGNOSIS — I69351 Hemiplegia and hemiparesis following cerebral infarction affecting right dominant side: Secondary | ICD-10-CM | POA: Diagnosis not present

## 2020-03-09 DIAGNOSIS — I255 Ischemic cardiomyopathy: Secondary | ICD-10-CM | POA: Diagnosis not present

## 2020-03-09 DIAGNOSIS — I428 Other cardiomyopathies: Secondary | ICD-10-CM | POA: Diagnosis not present

## 2020-03-09 DIAGNOSIS — R569 Unspecified convulsions: Secondary | ICD-10-CM | POA: Diagnosis not present

## 2020-03-09 DIAGNOSIS — I130 Hypertensive heart and chronic kidney disease with heart failure and stage 1 through stage 4 chronic kidney disease, or unspecified chronic kidney disease: Secondary | ICD-10-CM | POA: Diagnosis not present

## 2020-03-09 DIAGNOSIS — N390 Urinary tract infection, site not specified: Secondary | ICD-10-CM | POA: Diagnosis not present

## 2020-03-09 DIAGNOSIS — I5042 Chronic combined systolic (congestive) and diastolic (congestive) heart failure: Secondary | ICD-10-CM | POA: Diagnosis not present

## 2020-03-12 DIAGNOSIS — N390 Urinary tract infection, site not specified: Secondary | ICD-10-CM | POA: Diagnosis not present

## 2020-03-12 DIAGNOSIS — I5042 Chronic combined systolic (congestive) and diastolic (congestive) heart failure: Secondary | ICD-10-CM | POA: Diagnosis not present

## 2020-03-12 DIAGNOSIS — I130 Hypertensive heart and chronic kidney disease with heart failure and stage 1 through stage 4 chronic kidney disease, or unspecified chronic kidney disease: Secondary | ICD-10-CM | POA: Diagnosis not present

## 2020-03-12 DIAGNOSIS — E1122 Type 2 diabetes mellitus with diabetic chronic kidney disease: Secondary | ICD-10-CM | POA: Diagnosis not present

## 2020-03-12 DIAGNOSIS — R569 Unspecified convulsions: Secondary | ICD-10-CM | POA: Diagnosis not present

## 2020-03-12 DIAGNOSIS — I255 Ischemic cardiomyopathy: Secondary | ICD-10-CM | POA: Diagnosis not present

## 2020-03-12 DIAGNOSIS — I69351 Hemiplegia and hemiparesis following cerebral infarction affecting right dominant side: Secondary | ICD-10-CM | POA: Diagnosis not present

## 2020-03-12 DIAGNOSIS — N183 Chronic kidney disease, stage 3 unspecified: Secondary | ICD-10-CM | POA: Diagnosis not present

## 2020-03-12 DIAGNOSIS — I428 Other cardiomyopathies: Secondary | ICD-10-CM | POA: Diagnosis not present

## 2020-03-14 DIAGNOSIS — I428 Other cardiomyopathies: Secondary | ICD-10-CM | POA: Diagnosis not present

## 2020-03-14 DIAGNOSIS — I255 Ischemic cardiomyopathy: Secondary | ICD-10-CM | POA: Diagnosis not present

## 2020-03-14 DIAGNOSIS — G301 Alzheimer's disease with late onset: Secondary | ICD-10-CM | POA: Diagnosis not present

## 2020-03-14 DIAGNOSIS — F0280 Dementia in other diseases classified elsewhere without behavioral disturbance: Secondary | ICD-10-CM | POA: Diagnosis not present

## 2020-03-14 DIAGNOSIS — I5042 Chronic combined systolic (congestive) and diastolic (congestive) heart failure: Secondary | ICD-10-CM | POA: Diagnosis not present

## 2020-03-14 DIAGNOSIS — I5032 Chronic diastolic (congestive) heart failure: Secondary | ICD-10-CM | POA: Diagnosis not present

## 2020-03-14 DIAGNOSIS — E1122 Type 2 diabetes mellitus with diabetic chronic kidney disease: Secondary | ICD-10-CM | POA: Diagnosis not present

## 2020-03-14 DIAGNOSIS — N183 Chronic kidney disease, stage 3 unspecified: Secondary | ICD-10-CM | POA: Diagnosis not present

## 2020-03-14 DIAGNOSIS — I130 Hypertensive heart and chronic kidney disease with heart failure and stage 1 through stage 4 chronic kidney disease, or unspecified chronic kidney disease: Secondary | ICD-10-CM | POA: Diagnosis not present

## 2020-03-14 DIAGNOSIS — N390 Urinary tract infection, site not specified: Secondary | ICD-10-CM | POA: Diagnosis not present

## 2020-03-14 DIAGNOSIS — E7849 Other hyperlipidemia: Secondary | ICD-10-CM | POA: Diagnosis not present

## 2020-03-14 DIAGNOSIS — N184 Chronic kidney disease, stage 4 (severe): Secondary | ICD-10-CM | POA: Diagnosis not present

## 2020-03-14 DIAGNOSIS — I69351 Hemiplegia and hemiparesis following cerebral infarction affecting right dominant side: Secondary | ICD-10-CM | POA: Diagnosis not present

## 2020-03-14 DIAGNOSIS — R569 Unspecified convulsions: Secondary | ICD-10-CM | POA: Diagnosis not present

## 2020-03-16 DIAGNOSIS — I130 Hypertensive heart and chronic kidney disease with heart failure and stage 1 through stage 4 chronic kidney disease, or unspecified chronic kidney disease: Secondary | ICD-10-CM | POA: Diagnosis not present

## 2020-03-16 DIAGNOSIS — I428 Other cardiomyopathies: Secondary | ICD-10-CM | POA: Diagnosis not present

## 2020-03-16 DIAGNOSIS — I5042 Chronic combined systolic (congestive) and diastolic (congestive) heart failure: Secondary | ICD-10-CM | POA: Diagnosis not present

## 2020-03-16 DIAGNOSIS — I69351 Hemiplegia and hemiparesis following cerebral infarction affecting right dominant side: Secondary | ICD-10-CM | POA: Diagnosis not present

## 2020-03-16 DIAGNOSIS — E1122 Type 2 diabetes mellitus with diabetic chronic kidney disease: Secondary | ICD-10-CM | POA: Diagnosis not present

## 2020-03-16 DIAGNOSIS — I255 Ischemic cardiomyopathy: Secondary | ICD-10-CM | POA: Diagnosis not present

## 2020-03-16 DIAGNOSIS — N390 Urinary tract infection, site not specified: Secondary | ICD-10-CM | POA: Diagnosis not present

## 2020-03-16 DIAGNOSIS — R569 Unspecified convulsions: Secondary | ICD-10-CM | POA: Diagnosis not present

## 2020-03-16 DIAGNOSIS — N183 Chronic kidney disease, stage 3 unspecified: Secondary | ICD-10-CM | POA: Diagnosis not present

## 2020-03-19 DIAGNOSIS — E1122 Type 2 diabetes mellitus with diabetic chronic kidney disease: Secondary | ICD-10-CM | POA: Diagnosis not present

## 2020-03-19 DIAGNOSIS — N183 Chronic kidney disease, stage 3 unspecified: Secondary | ICD-10-CM | POA: Diagnosis not present

## 2020-03-19 DIAGNOSIS — N390 Urinary tract infection, site not specified: Secondary | ICD-10-CM | POA: Diagnosis not present

## 2020-03-19 DIAGNOSIS — I5042 Chronic combined systolic (congestive) and diastolic (congestive) heart failure: Secondary | ICD-10-CM | POA: Diagnosis not present

## 2020-03-19 DIAGNOSIS — I130 Hypertensive heart and chronic kidney disease with heart failure and stage 1 through stage 4 chronic kidney disease, or unspecified chronic kidney disease: Secondary | ICD-10-CM | POA: Diagnosis not present

## 2020-03-19 DIAGNOSIS — I428 Other cardiomyopathies: Secondary | ICD-10-CM | POA: Diagnosis not present

## 2020-03-19 DIAGNOSIS — R569 Unspecified convulsions: Secondary | ICD-10-CM | POA: Diagnosis not present

## 2020-03-19 DIAGNOSIS — I69351 Hemiplegia and hemiparesis following cerebral infarction affecting right dominant side: Secondary | ICD-10-CM | POA: Diagnosis not present

## 2020-03-19 DIAGNOSIS — I255 Ischemic cardiomyopathy: Secondary | ICD-10-CM | POA: Diagnosis not present

## 2020-03-20 DIAGNOSIS — I2510 Atherosclerotic heart disease of native coronary artery without angina pectoris: Secondary | ICD-10-CM | POA: Diagnosis not present

## 2020-03-20 DIAGNOSIS — I69351 Hemiplegia and hemiparesis following cerebral infarction affecting right dominant side: Secondary | ICD-10-CM | POA: Diagnosis not present

## 2020-03-20 DIAGNOSIS — G40909 Epilepsy, unspecified, not intractable, without status epilepticus: Secondary | ICD-10-CM | POA: Diagnosis not present

## 2020-03-20 DIAGNOSIS — I255 Ischemic cardiomyopathy: Secondary | ICD-10-CM | POA: Diagnosis not present

## 2020-03-20 DIAGNOSIS — D500 Iron deficiency anemia secondary to blood loss (chronic): Secondary | ICD-10-CM | POA: Diagnosis not present

## 2020-03-20 DIAGNOSIS — I130 Hypertensive heart and chronic kidney disease with heart failure and stage 1 through stage 4 chronic kidney disease, or unspecified chronic kidney disease: Secondary | ICD-10-CM | POA: Diagnosis not present

## 2020-03-20 DIAGNOSIS — K922 Gastrointestinal hemorrhage, unspecified: Secondary | ICD-10-CM | POA: Diagnosis not present

## 2020-03-20 DIAGNOSIS — I5042 Chronic combined systolic (congestive) and diastolic (congestive) heart failure: Secondary | ICD-10-CM | POA: Diagnosis not present

## 2020-03-20 DIAGNOSIS — E1122 Type 2 diabetes mellitus with diabetic chronic kidney disease: Secondary | ICD-10-CM | POA: Diagnosis not present

## 2020-03-21 DIAGNOSIS — E1122 Type 2 diabetes mellitus with diabetic chronic kidney disease: Secondary | ICD-10-CM | POA: Diagnosis not present

## 2020-03-21 DIAGNOSIS — I2510 Atherosclerotic heart disease of native coronary artery without angina pectoris: Secondary | ICD-10-CM | POA: Diagnosis not present

## 2020-03-21 DIAGNOSIS — G40909 Epilepsy, unspecified, not intractable, without status epilepticus: Secondary | ICD-10-CM | POA: Diagnosis not present

## 2020-03-21 DIAGNOSIS — D500 Iron deficiency anemia secondary to blood loss (chronic): Secondary | ICD-10-CM | POA: Diagnosis not present

## 2020-03-21 DIAGNOSIS — I69351 Hemiplegia and hemiparesis following cerebral infarction affecting right dominant side: Secondary | ICD-10-CM | POA: Diagnosis not present

## 2020-03-21 DIAGNOSIS — K922 Gastrointestinal hemorrhage, unspecified: Secondary | ICD-10-CM | POA: Diagnosis not present

## 2020-03-21 DIAGNOSIS — I5042 Chronic combined systolic (congestive) and diastolic (congestive) heart failure: Secondary | ICD-10-CM | POA: Diagnosis not present

## 2020-03-21 DIAGNOSIS — I130 Hypertensive heart and chronic kidney disease with heart failure and stage 1 through stage 4 chronic kidney disease, or unspecified chronic kidney disease: Secondary | ICD-10-CM | POA: Diagnosis not present

## 2020-03-21 DIAGNOSIS — I255 Ischemic cardiomyopathy: Secondary | ICD-10-CM | POA: Diagnosis not present

## 2020-03-24 DIAGNOSIS — K922 Gastrointestinal hemorrhage, unspecified: Secondary | ICD-10-CM | POA: Diagnosis not present

## 2020-03-27 DIAGNOSIS — D500 Iron deficiency anemia secondary to blood loss (chronic): Secondary | ICD-10-CM | POA: Diagnosis not present

## 2020-03-27 DIAGNOSIS — I130 Hypertensive heart and chronic kidney disease with heart failure and stage 1 through stage 4 chronic kidney disease, or unspecified chronic kidney disease: Secondary | ICD-10-CM | POA: Diagnosis not present

## 2020-03-27 DIAGNOSIS — G40909 Epilepsy, unspecified, not intractable, without status epilepticus: Secondary | ICD-10-CM | POA: Diagnosis not present

## 2020-03-27 DIAGNOSIS — I255 Ischemic cardiomyopathy: Secondary | ICD-10-CM | POA: Diagnosis not present

## 2020-03-27 DIAGNOSIS — E1122 Type 2 diabetes mellitus with diabetic chronic kidney disease: Secondary | ICD-10-CM | POA: Diagnosis not present

## 2020-03-27 DIAGNOSIS — I2510 Atherosclerotic heart disease of native coronary artery without angina pectoris: Secondary | ICD-10-CM | POA: Diagnosis not present

## 2020-03-27 DIAGNOSIS — I5042 Chronic combined systolic (congestive) and diastolic (congestive) heart failure: Secondary | ICD-10-CM | POA: Diagnosis not present

## 2020-03-27 DIAGNOSIS — I69351 Hemiplegia and hemiparesis following cerebral infarction affecting right dominant side: Secondary | ICD-10-CM | POA: Diagnosis not present

## 2020-03-27 DIAGNOSIS — K922 Gastrointestinal hemorrhage, unspecified: Secondary | ICD-10-CM | POA: Diagnosis not present

## 2020-03-28 DIAGNOSIS — D500 Iron deficiency anemia secondary to blood loss (chronic): Secondary | ICD-10-CM | POA: Diagnosis not present

## 2020-03-28 DIAGNOSIS — Z23 Encounter for immunization: Secondary | ICD-10-CM | POA: Diagnosis not present

## 2020-03-28 DIAGNOSIS — I2510 Atherosclerotic heart disease of native coronary artery without angina pectoris: Secondary | ICD-10-CM | POA: Diagnosis not present

## 2020-03-28 DIAGNOSIS — E1122 Type 2 diabetes mellitus with diabetic chronic kidney disease: Secondary | ICD-10-CM | POA: Diagnosis not present

## 2020-03-28 DIAGNOSIS — K922 Gastrointestinal hemorrhage, unspecified: Secondary | ICD-10-CM | POA: Diagnosis not present

## 2020-03-28 DIAGNOSIS — G40909 Epilepsy, unspecified, not intractable, without status epilepticus: Secondary | ICD-10-CM | POA: Diagnosis not present

## 2020-03-28 DIAGNOSIS — I255 Ischemic cardiomyopathy: Secondary | ICD-10-CM | POA: Diagnosis not present

## 2020-03-28 DIAGNOSIS — I69351 Hemiplegia and hemiparesis following cerebral infarction affecting right dominant side: Secondary | ICD-10-CM | POA: Diagnosis not present

## 2020-03-28 DIAGNOSIS — I5042 Chronic combined systolic (congestive) and diastolic (congestive) heart failure: Secondary | ICD-10-CM | POA: Diagnosis not present

## 2020-03-28 DIAGNOSIS — I130 Hypertensive heart and chronic kidney disease with heart failure and stage 1 through stage 4 chronic kidney disease, or unspecified chronic kidney disease: Secondary | ICD-10-CM | POA: Diagnosis not present

## 2020-03-29 DIAGNOSIS — K922 Gastrointestinal hemorrhage, unspecified: Secondary | ICD-10-CM | POA: Diagnosis not present

## 2020-03-29 DIAGNOSIS — I2510 Atherosclerotic heart disease of native coronary artery without angina pectoris: Secondary | ICD-10-CM | POA: Diagnosis not present

## 2020-03-29 DIAGNOSIS — G40909 Epilepsy, unspecified, not intractable, without status epilepticus: Secondary | ICD-10-CM | POA: Diagnosis not present

## 2020-03-29 DIAGNOSIS — I130 Hypertensive heart and chronic kidney disease with heart failure and stage 1 through stage 4 chronic kidney disease, or unspecified chronic kidney disease: Secondary | ICD-10-CM | POA: Diagnosis not present

## 2020-03-29 DIAGNOSIS — I255 Ischemic cardiomyopathy: Secondary | ICD-10-CM | POA: Diagnosis not present

## 2020-03-29 DIAGNOSIS — I69351 Hemiplegia and hemiparesis following cerebral infarction affecting right dominant side: Secondary | ICD-10-CM | POA: Diagnosis not present

## 2020-03-29 DIAGNOSIS — E1122 Type 2 diabetes mellitus with diabetic chronic kidney disease: Secondary | ICD-10-CM | POA: Diagnosis not present

## 2020-03-29 DIAGNOSIS — D500 Iron deficiency anemia secondary to blood loss (chronic): Secondary | ICD-10-CM | POA: Diagnosis not present

## 2020-03-29 DIAGNOSIS — I5042 Chronic combined systolic (congestive) and diastolic (congestive) heart failure: Secondary | ICD-10-CM | POA: Diagnosis not present

## 2020-04-04 DIAGNOSIS — I69351 Hemiplegia and hemiparesis following cerebral infarction affecting right dominant side: Secondary | ICD-10-CM | POA: Diagnosis not present

## 2020-04-04 DIAGNOSIS — K922 Gastrointestinal hemorrhage, unspecified: Secondary | ICD-10-CM | POA: Diagnosis not present

## 2020-04-04 DIAGNOSIS — I5042 Chronic combined systolic (congestive) and diastolic (congestive) heart failure: Secondary | ICD-10-CM | POA: Diagnosis not present

## 2020-04-04 DIAGNOSIS — E1122 Type 2 diabetes mellitus with diabetic chronic kidney disease: Secondary | ICD-10-CM | POA: Diagnosis not present

## 2020-04-04 DIAGNOSIS — I2510 Atherosclerotic heart disease of native coronary artery without angina pectoris: Secondary | ICD-10-CM | POA: Diagnosis not present

## 2020-04-04 DIAGNOSIS — G40909 Epilepsy, unspecified, not intractable, without status epilepticus: Secondary | ICD-10-CM | POA: Diagnosis not present

## 2020-04-04 DIAGNOSIS — D500 Iron deficiency anemia secondary to blood loss (chronic): Secondary | ICD-10-CM | POA: Diagnosis not present

## 2020-04-04 DIAGNOSIS — I130 Hypertensive heart and chronic kidney disease with heart failure and stage 1 through stage 4 chronic kidney disease, or unspecified chronic kidney disease: Secondary | ICD-10-CM | POA: Diagnosis not present

## 2020-04-04 DIAGNOSIS — I255 Ischemic cardiomyopathy: Secondary | ICD-10-CM | POA: Diagnosis not present

## 2020-04-05 DIAGNOSIS — I130 Hypertensive heart and chronic kidney disease with heart failure and stage 1 through stage 4 chronic kidney disease, or unspecified chronic kidney disease: Secondary | ICD-10-CM | POA: Diagnosis not present

## 2020-04-05 DIAGNOSIS — I6782 Cerebral ischemia: Secondary | ICD-10-CM | POA: Diagnosis not present

## 2020-04-05 DIAGNOSIS — D500 Iron deficiency anemia secondary to blood loss (chronic): Secondary | ICD-10-CM | POA: Diagnosis not present

## 2020-04-05 DIAGNOSIS — I255 Ischemic cardiomyopathy: Secondary | ICD-10-CM | POA: Diagnosis not present

## 2020-04-05 DIAGNOSIS — I2510 Atherosclerotic heart disease of native coronary artery without angina pectoris: Secondary | ICD-10-CM | POA: Diagnosis not present

## 2020-04-05 DIAGNOSIS — K922 Gastrointestinal hemorrhage, unspecified: Secondary | ICD-10-CM | POA: Diagnosis not present

## 2020-04-05 DIAGNOSIS — J3489 Other specified disorders of nose and nasal sinuses: Secondary | ICD-10-CM | POA: Diagnosis not present

## 2020-04-05 DIAGNOSIS — R531 Weakness: Secondary | ICD-10-CM | POA: Diagnosis not present

## 2020-04-05 DIAGNOSIS — I69351 Hemiplegia and hemiparesis following cerebral infarction affecting right dominant side: Secondary | ICD-10-CM | POA: Diagnosis not present

## 2020-04-05 DIAGNOSIS — E119 Type 2 diabetes mellitus without complications: Secondary | ICD-10-CM | POA: Diagnosis not present

## 2020-04-05 DIAGNOSIS — I5042 Chronic combined systolic (congestive) and diastolic (congestive) heart failure: Secondary | ICD-10-CM | POA: Diagnosis not present

## 2020-04-05 DIAGNOSIS — Z8673 Personal history of transient ischemic attack (TIA), and cerebral infarction without residual deficits: Secondary | ICD-10-CM | POA: Diagnosis not present

## 2020-04-05 DIAGNOSIS — I10 Essential (primary) hypertension: Secondary | ICD-10-CM | POA: Diagnosis not present

## 2020-04-05 DIAGNOSIS — E1122 Type 2 diabetes mellitus with diabetic chronic kidney disease: Secondary | ICD-10-CM | POA: Diagnosis not present

## 2020-04-05 DIAGNOSIS — G40909 Epilepsy, unspecified, not intractable, without status epilepticus: Secondary | ICD-10-CM | POA: Diagnosis not present

## 2020-04-05 DIAGNOSIS — N390 Urinary tract infection, site not specified: Secondary | ICD-10-CM | POA: Diagnosis not present

## 2020-04-05 DIAGNOSIS — G319 Degenerative disease of nervous system, unspecified: Secondary | ICD-10-CM | POA: Diagnosis not present

## 2020-04-05 DIAGNOSIS — R4182 Altered mental status, unspecified: Secondary | ICD-10-CM | POA: Diagnosis not present

## 2020-04-05 DIAGNOSIS — I7090 Unspecified atherosclerosis: Secondary | ICD-10-CM | POA: Diagnosis not present

## 2020-04-11 DIAGNOSIS — I5042 Chronic combined systolic (congestive) and diastolic (congestive) heart failure: Secondary | ICD-10-CM | POA: Diagnosis not present

## 2020-04-11 DIAGNOSIS — D500 Iron deficiency anemia secondary to blood loss (chronic): Secondary | ICD-10-CM | POA: Diagnosis not present

## 2020-04-11 DIAGNOSIS — I255 Ischemic cardiomyopathy: Secondary | ICD-10-CM | POA: Diagnosis not present

## 2020-04-11 DIAGNOSIS — I69351 Hemiplegia and hemiparesis following cerebral infarction affecting right dominant side: Secondary | ICD-10-CM | POA: Diagnosis not present

## 2020-04-11 DIAGNOSIS — I2510 Atherosclerotic heart disease of native coronary artery without angina pectoris: Secondary | ICD-10-CM | POA: Diagnosis not present

## 2020-04-11 DIAGNOSIS — K922 Gastrointestinal hemorrhage, unspecified: Secondary | ICD-10-CM | POA: Diagnosis not present

## 2020-04-11 DIAGNOSIS — G40909 Epilepsy, unspecified, not intractable, without status epilepticus: Secondary | ICD-10-CM | POA: Diagnosis not present

## 2020-04-11 DIAGNOSIS — E1122 Type 2 diabetes mellitus with diabetic chronic kidney disease: Secondary | ICD-10-CM | POA: Diagnosis not present

## 2020-04-11 DIAGNOSIS — I130 Hypertensive heart and chronic kidney disease with heart failure and stage 1 through stage 4 chronic kidney disease, or unspecified chronic kidney disease: Secondary | ICD-10-CM | POA: Diagnosis not present

## 2020-04-13 DIAGNOSIS — F0280 Dementia in other diseases classified elsewhere without behavioral disturbance: Secondary | ICD-10-CM | POA: Diagnosis not present

## 2020-04-13 DIAGNOSIS — N184 Chronic kidney disease, stage 4 (severe): Secondary | ICD-10-CM | POA: Diagnosis not present

## 2020-04-13 DIAGNOSIS — I130 Hypertensive heart and chronic kidney disease with heart failure and stage 1 through stage 4 chronic kidney disease, or unspecified chronic kidney disease: Secondary | ICD-10-CM | POA: Diagnosis not present

## 2020-04-13 DIAGNOSIS — I5032 Chronic diastolic (congestive) heart failure: Secondary | ICD-10-CM | POA: Diagnosis not present

## 2020-04-13 DIAGNOSIS — E7849 Other hyperlipidemia: Secondary | ICD-10-CM | POA: Diagnosis not present

## 2020-04-13 DIAGNOSIS — G301 Alzheimer's disease with late onset: Secondary | ICD-10-CM | POA: Diagnosis not present

## 2020-04-13 DIAGNOSIS — E1122 Type 2 diabetes mellitus with diabetic chronic kidney disease: Secondary | ICD-10-CM | POA: Diagnosis not present

## 2020-04-16 DIAGNOSIS — K2100 Gastro-esophageal reflux disease with esophagitis, without bleeding: Secondary | ICD-10-CM | POA: Diagnosis not present

## 2020-04-16 DIAGNOSIS — I4891 Unspecified atrial fibrillation: Secondary | ICD-10-CM | POA: Diagnosis not present

## 2020-04-16 DIAGNOSIS — J449 Chronic obstructive pulmonary disease, unspecified: Secondary | ICD-10-CM | POA: Diagnosis not present

## 2020-04-16 DIAGNOSIS — I110 Hypertensive heart disease with heart failure: Secondary | ICD-10-CM | POA: Diagnosis not present

## 2020-04-16 DIAGNOSIS — D692 Other nonthrombocytopenic purpura: Secondary | ICD-10-CM | POA: Diagnosis not present

## 2020-04-16 DIAGNOSIS — E1151 Type 2 diabetes mellitus with diabetic peripheral angiopathy without gangrene: Secondary | ICD-10-CM | POA: Diagnosis not present

## 2020-04-16 DIAGNOSIS — I509 Heart failure, unspecified: Secondary | ICD-10-CM | POA: Diagnosis not present

## 2020-04-16 DIAGNOSIS — Z7902 Long term (current) use of antithrombotics/antiplatelets: Secondary | ICD-10-CM | POA: Diagnosis not present

## 2020-04-16 DIAGNOSIS — F331 Major depressive disorder, recurrent, moderate: Secondary | ICD-10-CM | POA: Diagnosis not present

## 2020-04-16 DIAGNOSIS — I70209 Unspecified atherosclerosis of native arteries of extremities, unspecified extremity: Secondary | ICD-10-CM | POA: Diagnosis not present

## 2020-05-11 DIAGNOSIS — Z515 Encounter for palliative care: Secondary | ICD-10-CM | POA: Diagnosis not present

## 2020-05-11 DIAGNOSIS — Z7951 Long term (current) use of inhaled steroids: Secondary | ICD-10-CM | POA: Diagnosis not present

## 2020-05-11 DIAGNOSIS — Z7902 Long term (current) use of antithrombotics/antiplatelets: Secondary | ICD-10-CM | POA: Diagnosis not present

## 2020-05-11 DIAGNOSIS — J449 Chronic obstructive pulmonary disease, unspecified: Secondary | ICD-10-CM | POA: Diagnosis not present

## 2020-05-11 DIAGNOSIS — I69351 Hemiplegia and hemiparesis following cerebral infarction affecting right dominant side: Secondary | ICD-10-CM | POA: Diagnosis not present

## 2020-05-11 DIAGNOSIS — I509 Heart failure, unspecified: Secondary | ICD-10-CM | POA: Diagnosis not present

## 2020-05-11 DIAGNOSIS — N184 Chronic kidney disease, stage 4 (severe): Secondary | ICD-10-CM | POA: Diagnosis not present

## 2020-05-15 DIAGNOSIS — I130 Hypertensive heart and chronic kidney disease with heart failure and stage 1 through stage 4 chronic kidney disease, or unspecified chronic kidney disease: Secondary | ICD-10-CM | POA: Diagnosis not present

## 2020-05-15 DIAGNOSIS — I5032 Chronic diastolic (congestive) heart failure: Secondary | ICD-10-CM | POA: Diagnosis not present

## 2020-05-15 DIAGNOSIS — N184 Chronic kidney disease, stage 4 (severe): Secondary | ICD-10-CM | POA: Diagnosis not present

## 2020-05-15 DIAGNOSIS — E1122 Type 2 diabetes mellitus with diabetic chronic kidney disease: Secondary | ICD-10-CM | POA: Diagnosis not present

## 2020-05-18 DIAGNOSIS — R509 Fever, unspecified: Secondary | ICD-10-CM | POA: Diagnosis not present

## 2020-05-18 DIAGNOSIS — R479 Unspecified speech disturbances: Secondary | ICD-10-CM | POA: Diagnosis not present

## 2020-05-18 DIAGNOSIS — R82998 Other abnormal findings in urine: Secondary | ICD-10-CM | POA: Diagnosis not present

## 2020-05-18 DIAGNOSIS — R630 Anorexia: Secondary | ICD-10-CM | POA: Diagnosis not present

## 2020-05-18 DIAGNOSIS — R5383 Other fatigue: Secondary | ICD-10-CM | POA: Diagnosis not present

## 2020-05-20 DIAGNOSIS — N390 Urinary tract infection, site not specified: Secondary | ICD-10-CM | POA: Diagnosis not present

## 2020-05-20 DIAGNOSIS — E860 Dehydration: Secondary | ICD-10-CM | POA: Diagnosis not present

## 2020-05-20 DIAGNOSIS — R410 Disorientation, unspecified: Secondary | ICD-10-CM | POA: Diagnosis not present

## 2020-05-28 DIAGNOSIS — E1165 Type 2 diabetes mellitus with hyperglycemia: Secondary | ICD-10-CM | POA: Diagnosis not present

## 2020-05-28 DIAGNOSIS — N184 Chronic kidney disease, stage 4 (severe): Secondary | ICD-10-CM | POA: Diagnosis not present

## 2020-05-28 DIAGNOSIS — D692 Other nonthrombocytopenic purpura: Secondary | ICD-10-CM | POA: Diagnosis not present

## 2020-05-28 DIAGNOSIS — Z794 Long term (current) use of insulin: Secondary | ICD-10-CM | POA: Diagnosis not present

## 2020-05-28 DIAGNOSIS — Z7902 Long term (current) use of antithrombotics/antiplatelets: Secondary | ICD-10-CM | POA: Diagnosis not present

## 2020-05-28 DIAGNOSIS — I70209 Unspecified atherosclerosis of native arteries of extremities, unspecified extremity: Secondary | ICD-10-CM | POA: Diagnosis not present

## 2020-05-28 DIAGNOSIS — M5442 Lumbago with sciatica, left side: Secondary | ICD-10-CM | POA: Diagnosis not present

## 2020-05-28 DIAGNOSIS — E1151 Type 2 diabetes mellitus with diabetic peripheral angiopathy without gangrene: Secondary | ICD-10-CM | POA: Diagnosis not present

## 2020-05-28 DIAGNOSIS — F0390 Unspecified dementia without behavioral disturbance: Secondary | ICD-10-CM | POA: Diagnosis not present

## 2020-05-28 DIAGNOSIS — K2100 Gastro-esophageal reflux disease with esophagitis, without bleeding: Secondary | ICD-10-CM | POA: Diagnosis not present

## 2020-05-28 DIAGNOSIS — I130 Hypertensive heart and chronic kidney disease with heart failure and stage 1 through stage 4 chronic kidney disease, or unspecified chronic kidney disease: Secondary | ICD-10-CM | POA: Diagnosis not present

## 2020-05-28 DIAGNOSIS — F331 Major depressive disorder, recurrent, moderate: Secondary | ICD-10-CM | POA: Diagnosis not present

## 2020-05-28 DIAGNOSIS — F1120 Opioid dependence, uncomplicated: Secondary | ICD-10-CM | POA: Diagnosis not present

## 2020-05-28 DIAGNOSIS — N390 Urinary tract infection, site not specified: Secondary | ICD-10-CM | POA: Diagnosis not present

## 2020-05-28 DIAGNOSIS — J449 Chronic obstructive pulmonary disease, unspecified: Secondary | ICD-10-CM | POA: Diagnosis not present

## 2020-05-28 DIAGNOSIS — S81802D Unspecified open wound, left lower leg, subsequent encounter: Secondary | ICD-10-CM | POA: Diagnosis not present

## 2020-05-28 DIAGNOSIS — Z7901 Long term (current) use of anticoagulants: Secondary | ICD-10-CM | POA: Diagnosis not present

## 2020-05-28 DIAGNOSIS — Z515 Encounter for palliative care: Secondary | ICD-10-CM | POA: Diagnosis not present

## 2020-05-28 DIAGNOSIS — E1122 Type 2 diabetes mellitus with diabetic chronic kidney disease: Secondary | ICD-10-CM | POA: Diagnosis not present

## 2020-06-07 DIAGNOSIS — N184 Chronic kidney disease, stage 4 (severe): Secondary | ICD-10-CM | POA: Diagnosis not present

## 2020-06-07 DIAGNOSIS — I10 Essential (primary) hypertension: Secondary | ICD-10-CM | POA: Diagnosis not present

## 2020-06-07 DIAGNOSIS — R945 Abnormal results of liver function studies: Secondary | ICD-10-CM | POA: Diagnosis not present

## 2020-06-07 DIAGNOSIS — E1122 Type 2 diabetes mellitus with diabetic chronic kidney disease: Secondary | ICD-10-CM | POA: Diagnosis not present

## 2020-06-07 DIAGNOSIS — D509 Iron deficiency anemia, unspecified: Secondary | ICD-10-CM | POA: Diagnosis not present

## 2020-06-07 DIAGNOSIS — R748 Abnormal levels of other serum enzymes: Secondary | ICD-10-CM | POA: Diagnosis not present

## 2020-06-07 DIAGNOSIS — E782 Mixed hyperlipidemia: Secondary | ICD-10-CM | POA: Diagnosis not present

## 2020-06-07 DIAGNOSIS — K2100 Gastro-esophageal reflux disease with esophagitis, without bleeding: Secondary | ICD-10-CM | POA: Diagnosis not present

## 2020-06-07 DIAGNOSIS — E1165 Type 2 diabetes mellitus with hyperglycemia: Secondary | ICD-10-CM | POA: Diagnosis not present

## 2020-06-07 DIAGNOSIS — G301 Alzheimer's disease with late onset: Secondary | ICD-10-CM | POA: Diagnosis not present

## 2020-06-11 DIAGNOSIS — E1122 Type 2 diabetes mellitus with diabetic chronic kidney disease: Secondary | ICD-10-CM | POA: Diagnosis not present

## 2020-06-11 DIAGNOSIS — Z0001 Encounter for general adult medical examination with abnormal findings: Secondary | ICD-10-CM | POA: Diagnosis not present

## 2020-06-11 DIAGNOSIS — Z1212 Encounter for screening for malignant neoplasm of rectum: Secondary | ICD-10-CM | POA: Diagnosis not present

## 2020-06-11 DIAGNOSIS — I10 Essential (primary) hypertension: Secondary | ICD-10-CM | POA: Diagnosis not present

## 2020-06-11 DIAGNOSIS — I5032 Chronic diastolic (congestive) heart failure: Secondary | ICD-10-CM | POA: Diagnosis not present

## 2020-06-11 DIAGNOSIS — I69351 Hemiplegia and hemiparesis following cerebral infarction affecting right dominant side: Secondary | ICD-10-CM | POA: Diagnosis not present

## 2020-06-11 DIAGNOSIS — R569 Unspecified convulsions: Secondary | ICD-10-CM | POA: Diagnosis not present

## 2020-06-11 DIAGNOSIS — E0590 Thyrotoxicosis, unspecified without thyrotoxic crisis or storm: Secondary | ICD-10-CM | POA: Diagnosis not present

## 2020-06-14 DIAGNOSIS — I5032 Chronic diastolic (congestive) heart failure: Secondary | ICD-10-CM | POA: Diagnosis not present

## 2020-06-14 DIAGNOSIS — E1122 Type 2 diabetes mellitus with diabetic chronic kidney disease: Secondary | ICD-10-CM | POA: Diagnosis not present

## 2020-06-14 DIAGNOSIS — I130 Hypertensive heart and chronic kidney disease with heart failure and stage 1 through stage 4 chronic kidney disease, or unspecified chronic kidney disease: Secondary | ICD-10-CM | POA: Diagnosis not present

## 2020-06-14 DIAGNOSIS — N184 Chronic kidney disease, stage 4 (severe): Secondary | ICD-10-CM | POA: Diagnosis not present

## 2020-06-30 DIAGNOSIS — L601 Onycholysis: Secondary | ICD-10-CM | POA: Diagnosis not present

## 2020-07-05 DIAGNOSIS — S91209A Unspecified open wound of unspecified toe(s) with damage to nail, initial encounter: Secondary | ICD-10-CM | POA: Diagnosis not present

## 2020-07-10 DIAGNOSIS — L601 Onycholysis: Secondary | ICD-10-CM | POA: Diagnosis not present

## 2020-07-14 DIAGNOSIS — E1122 Type 2 diabetes mellitus with diabetic chronic kidney disease: Secondary | ICD-10-CM | POA: Diagnosis not present

## 2020-07-14 DIAGNOSIS — N184 Chronic kidney disease, stage 4 (severe): Secondary | ICD-10-CM | POA: Diagnosis not present

## 2020-07-14 DIAGNOSIS — I5032 Chronic diastolic (congestive) heart failure: Secondary | ICD-10-CM | POA: Diagnosis not present

## 2020-07-14 DIAGNOSIS — I130 Hypertensive heart and chronic kidney disease with heart failure and stage 1 through stage 4 chronic kidney disease, or unspecified chronic kidney disease: Secondary | ICD-10-CM | POA: Diagnosis not present

## 2020-07-19 ENCOUNTER — Other Ambulatory Visit: Payer: Self-pay

## 2020-07-19 ENCOUNTER — Ambulatory Visit (INDEPENDENT_AMBULATORY_CARE_PROVIDER_SITE_OTHER): Payer: Medicare HMO | Admitting: Podiatry

## 2020-07-19 ENCOUNTER — Encounter: Payer: Self-pay | Admitting: Podiatry

## 2020-07-19 DIAGNOSIS — S91209A Unspecified open wound of unspecified toe(s) with damage to nail, initial encounter: Secondary | ICD-10-CM | POA: Diagnosis not present

## 2020-07-19 NOTE — Progress Notes (Signed)
  Subjective:  Patient ID: Norma [name], female    DOB: 07/31/1934,  MRN: [id number]  Chief Complaint  Patient presents with  . Nail Problem    Right hallux nail came off around October 28/29 and they just wanted to have the nail looked at     86 y.o. female presents with the above complaint. History confirmed with patient.   Objective:  Physical Exam: warm, good capillary refill, no trophic changes or ulcerative lesions, weakly palpable DP and PT pulses, normal sensory exam and venous insufficiency noted.  Right Foot: Hallux nail previously traumatically avulsed, nailbed is healing well, no signs of section or open wound  Assessment:   1. Traumatic avulsion of nail plate of toe, initial encounter      Plan:  Patient was evaluated and treated and all questions answered.  Advised that the nail should continue to regrow on its own.  There is no signs infection or need for antibiotics ointment or Epsom salt soaks.  Return if symptoms worsen or fail to improve.

## 2020-07-23 DIAGNOSIS — Z23 Encounter for immunization: Secondary | ICD-10-CM | POA: Diagnosis not present

## 2020-08-14 DIAGNOSIS — E1122 Type 2 diabetes mellitus with diabetic chronic kidney disease: Secondary | ICD-10-CM | POA: Diagnosis not present

## 2020-08-14 DIAGNOSIS — I130 Hypertensive heart and chronic kidney disease with heart failure and stage 1 through stage 4 chronic kidney disease, or unspecified chronic kidney disease: Secondary | ICD-10-CM | POA: Diagnosis not present

## 2020-08-14 DIAGNOSIS — N184 Chronic kidney disease, stage 4 (severe): Secondary | ICD-10-CM | POA: Diagnosis not present

## 2020-08-14 DIAGNOSIS — I5032 Chronic diastolic (congestive) heart failure: Secondary | ICD-10-CM | POA: Diagnosis not present

## 2020-08-16 DIAGNOSIS — Z515 Encounter for palliative care: Secondary | ICD-10-CM | POA: Diagnosis not present

## 2020-08-16 DIAGNOSIS — R630 Anorexia: Secondary | ICD-10-CM | POA: Diagnosis not present

## 2020-08-16 DIAGNOSIS — F0390 Unspecified dementia without behavioral disturbance: Secondary | ICD-10-CM | POA: Diagnosis not present

## 2020-08-16 DIAGNOSIS — E46 Unspecified protein-calorie malnutrition: Secondary | ICD-10-CM | POA: Diagnosis not present

## 2020-08-16 DIAGNOSIS — S81802D Unspecified open wound, left lower leg, subsequent encounter: Secondary | ICD-10-CM | POA: Diagnosis not present

## 2020-08-16 DIAGNOSIS — D692 Other nonthrombocytopenic purpura: Secondary | ICD-10-CM | POA: Diagnosis not present

## 2020-08-16 DIAGNOSIS — I70209 Unspecified atherosclerosis of native arteries of extremities, unspecified extremity: Secondary | ICD-10-CM | POA: Diagnosis not present

## 2020-08-16 DIAGNOSIS — Z7902 Long term (current) use of antithrombotics/antiplatelets: Secondary | ICD-10-CM | POA: Diagnosis not present

## 2020-08-16 DIAGNOSIS — I509 Heart failure, unspecified: Secondary | ICD-10-CM | POA: Diagnosis not present

## 2020-08-23 ENCOUNTER — Ambulatory Visit: Payer: Medicare HMO | Admitting: Cardiology

## 2020-08-23 NOTE — Progress Notes (Signed)
Cardiology Office Note  Date: 08/24/2020   ID: VONZELLA [name], DOB 1934-04-09, MRN [id number]  PCP:  Caryl Bis, MD  Cardiologist:  Rozann Lesches, MD Electrophysiologist:  None   Chief Complaint: Atrial fibrillation/flutter  History of Present Illness: Norma [name] is a 86 y.o. female with a history of atrial fibrillation/flutter, CAD, anemia, secondary cardiomyopathy/Takotsubo, CVA, DM 2, CKD.  History of stress-induced cardiomyopathy in 2012.  Atrial fibrillation status post cardioversion attempt September 2020.  Eventual reversion to atrial fibrillation.  Not tolerant of amiodarone.  Managed with rate control strategy.  CHA2DS2-VASc score of 8.  Previous echocardiogram August 2020 with LVEF 45 to 50%.  Last encounter with Dr. Domenic Polite 02/02/2020.  She was back in normal sinus rhythm at that visit.  Plan was to continue observation on beta-blocker and Eliquis.  Did not appear to be fluid overloaded.  Continuing Coreg.  Continuing Zocor.  She is here today being accompanied by her grandson.  She is in a wheelchair.  She appears very debilitated.  [name] states they pretty much do everything for her.  She has some residual right-sided hemiplegia secondary to her CVA.  No recent palpitations or arrhythmias, dyspnea, anginal symptoms.  No evidence of fluid volume overload.  She has no lower extremity edema.  She was unable to weigh today per nursing staff.  Last weight was 130 pounds on 02/19/2020 which was a 5 pound decrease from previous weight.  May 2021.  Denies any orthopnea or PND.  No bleeding issues.  Pulses are regular.   Past Medical History:  Diagnosis Date  . Anemia of chronic disease   . Atrial fibrillation ([place])   . Atypical atrial flutter ([place])   . CAD (coronary artery disease)    a. cath 2008 - 30% prox Cx, 70% distal Cx treated medically.  . Chronic pain syndrome   . CKD (chronic kidney disease), stage III ([place])   . History of blood transfusion 1960's  .  Pneumonia   . Secondary cardiomyopathy ([place])   . Stroke [facility]) ~ 2011  . Takotsubo cardiomyopathy    LVEF was 20-25%, but normalized in 2018.  . Type II diabetes mellitus ([place])     Past Surgical History:  Procedure Laterality Date  . ABDOMINAL HYSTERECTOMY    . APPENDECTOMY    . CARDIOVERSION N/A 06/08/2019   Procedure: CARDIOVERSION;  Surgeon: Donato Heinz, MD;  Location: [facility] ENDOSCOPY;  Service: Endoscopy;  Laterality: N/A;  . CATARACT EXTRACTION, BILATERAL    . CHOLECYSTECTOMY    . DILATION AND CURETTAGE OF UTERUS    . ESOPHAGOGASTRODUODENOSCOPY (EGD) WITH PROPOFOL N/A 04/17/2019   Procedure: ESOPHAGOGASTRODUODENOSCOPY (EGD) WITH PROPOFOL;  Surgeon: Otis Brace, MD;  Location: [place];  Service: Gastroenterology;  Laterality: N/A;  . ESOPHAGOGASTRODUODENOSCOPY (EGD) WITH PROPOFOL Left 02/20/2020   Procedure: ESOPHAGOGASTRODUODENOSCOPY (EGD) WITH PROPOFOL;  Surgeon: Wilford Corner, MD;  Location: [place];  Service: Endoscopy;  Laterality: Left;  . FOREIGN BODY REMOVAL  04/17/2019   Procedure: FOREIGN BODY REMOVAL;  Surgeon: Otis Brace, MD;  Location: [place];  Service: Gastroenterology;;  . PATELLA RECONSTRUCTION Bilateral    "had my knee caps replaced" (12/23/2012)  . TONSILLECTOMY      Current Outpatient Medications  Medication Sig Dispense Refill  . apixaban (ELIQUIS) 2.5 MG TABS tablet Take 1 tablet (2.5 mg total) by mouth 2 (two) times daily. 60 tablet 2  . azithromycin (ZITHROMAX) 250 MG tablet     . buPROPion (WELLBUTRIN XL) 300 MG 24  hr tablet Take 300 mg by mouth daily.     . carvedilol (COREG) 25 MG tablet Take 1 tablet (25 mg total) by mouth 2 (two) times daily with a meal. 60 tablet 6  . cephALEXin (KEFLEX) 500 MG capsule     . cholecalciferol (VITAMIN D) 25 MCG (1000 UNIT) tablet Take 2,000 Units by mouth daily.    . DULoxetine (CYMBALTA) 60 MG capsule Take 60 mg by mouth daily.    . ergocalciferol (VITAMIN D2) 1.25 MG (50000 UT)  capsule Take 50,000 Units by mouth once a week.    Marland Kitchen glipiZIDE (GLUCOTROL XL) 2.5 MG 24 hr tablet Take 2.5 mg by mouth daily with breakfast.    . insulin detemir (LEVEMIR) 100 UNIT/ML injection Inject 0.1 mLs (10 Units total) into the skin at bedtime. (Patient taking differently: Inject 8-22 Units into the skin as needed. Per sliding scale) 10 mL 11  . levETIRAcetam (KEPPRA) 500 MG tablet     . losartan (COZAAR) 25 MG tablet Take 25 mg by mouth daily.    . nitrofurantoin, macrocrystal-monohydrate, (MACROBID) 100 MG capsule     . oxyCODONE-acetaminophen (PERCOCET) 10-325 MG tablet Take 1 tablet by mouth 4 (four) times daily as needed for pain. 20 tablet 0  . saccharomyces boulardii (FLORASTOR) 250 MG capsule Take 1 capsule (250 mg total) 2 (two) times daily by mouth. (Patient taking differently: Take 250 mg by mouth daily.) 21 capsule 0  . simvastatin (ZOCOR) 20 MG tablet Take 20 mg by mouth daily.    Marland Kitchen ZINC OXIDE, TOPICAL, (DIAPER RASH) 10 % CREA Apply 1 application topically daily as needed (skin).     . pantoprazole (PROTONIX) 40 MG tablet Take 1 tablet (40 mg total) by mouth 2 (two) times daily for 60 days, THEN 1 tablet (40 mg total) daily. 150 tablet 0  . sucralfate (CARAFATE) 1 g tablet Take 1 tablet (1 g total) by mouth 4 (four) times daily -  with meals and at bedtime for 7 days. 28 tablet 0   No current facility-administered medications for this visit.   Allergies:  Patient has no known allergies.   Social History: The patient  reports that she has never smoked. She has never used smokeless tobacco. She reports that she does not drink alcohol and does not use drugs.   Family History: The patient's family history includes Cancer in her brother and father; Heart attack (age of onset: 26) in her father; Heart disease in her daughter.   ROS:  Please see the history of present illness. Otherwise, complete review of systems is positive for none.  All other systems are reviewed and negative.    Physical Exam: VS:  BP 110/64   Pulse 62   SpO2 93% , BMI There is no height or weight on file to calculate BMI.  Wt Readings from Last 3 Encounters:  02/19/20 130 lb 15.3 oz (59.4 kg)  02/02/20 145 lb (65.8 kg)  12/03/19 134 lb 7.7 oz (61 kg)    General: Patient appears comfortable at rest. Neck: Supple, no elevated JVP or carotid bruits, no thyromegaly. Lungs: Clear to auscultation, nonlabored breathing at rest. Cardiac: Regular rate and rhythm, no S3 or significant systolic murmur, no pericardial rub. Extremities: No pitting edema, distal pulses 2+. Skin: Warm and dry. Musculoskeletal: No kyphosis. Neuropsychiatric: Alert and oriented x3, affect grossly appropriate.  ECG:  EKG February 19, 2020 showed normal sinus rhythm rate of 98, nonspecific ST and T wave abnormality.  No longer in  atrial flutter  Recent Labwork: 10/27/2019: TSH 1.098 11/29/2019: B Natriuretic Peptide 452.0 02/19/2020: ALT 19; AST 18 02/23/2020: BUN 11; Creatinine, Ser 1.09; Hemoglobin 8.1; Magnesium 2.2; Platelets 187; Potassium 3.9; Sodium 137  No results found for: CHOL, TRIG, HDL, CHOLHDL, VLDL, LDLCALC, LDLDIRECT  Other Studies Reviewed Today:  Echocardiogram 04/18/2019: 1. The left ventricle has mildly reduced systolic function, with an  ejection fraction of 45-50%. The cavity size was normal. There is mildly  increased left ventricular wall thickness. Left ventricular diastolic  Doppler parameters are indeterminate.  Hypokinesis of the mid anteroseptal and mid inferoseptal wall segments,  unusual pattern.  2. The right ventricle has mildly reduced systolic function. The cavity  was mildly enlarged. There is no increase in right ventricular wall  thickness.  3. Left atrial size was moderately dilated.  4. Right atrial size was mildly dilated.  5. There is mild mitral annular calcification present. No evidence of  mitral valve stenosis. Mild mitral regurgitation.  6. Tricuspid valve regurgitation  is mild-moderate.  7. The aortic valve is tricuspid. Mild calcification of the aortic valve.  No stenosis of the aortic valve.  8. The aorta is normal in size and structure.  9. The inferior vena cava was normal in size with <50% respiratory  variability. PA systolic pressure 31 mmHg.  10. The patient was in atrial fibrillation.   Assessment and Plan:  1. Persistent atrial fibrillation ([place])   2. Secondary cardiomyopathy ([place])   3. History of CVA (cerebrovascular accident)   4. Mixed hyperlipidemia    1. Persistent atrial fibrillation (H[facility]) Pulses regular.  Last EKG showed normal sinus rhythm on 02/19/2020.  Continue carvedilol 25 mg p.o. twice daily.  Can continue Eliquis 2.5 mg p.o. twice daily.  2. Secondary cardiomyopathy ([place]) Last echocardiogram August 2020 showed EF of 45 to 50%.,  Hypokinesis of the mid anteroseptal and mid inferoseptal wall segments, unusual pattern.  LA size is mildly dilated, right atrial size mildly dilated, mild MR, TR mild to moderate.  Continue losartan 25 mg daily.  Carvedilol 25 mg p.o. twice daily.  3. History of CVA (cerebrovascular accident) No new CVA or TIA-like symptoms.  She does have right-sided hemiplegia.  [name] states she is basically total care at home.  He states they basically have to do everything for her.  Today she is in a wheelchair.     Medication Adjustments/Labs and Tests Ordered: Current medicines are reviewed at length with the patient today.  Concerns regarding medicines are outlined above.   Disposition: Follow-up with Dr. Domenic Polite or APP 6 months  Signed, Levell July, NP 08/24/2020 2:06 PM    [facility] [place], [place], [place] [address] Phone: [phone]; Fax: [phone]

## 2020-08-24 ENCOUNTER — Other Ambulatory Visit: Payer: Self-pay

## 2020-08-24 ENCOUNTER — Ambulatory Visit (INDEPENDENT_AMBULATORY_CARE_PROVIDER_SITE_OTHER): Payer: Medicare HMO | Admitting: Family Medicine

## 2020-08-24 ENCOUNTER — Encounter: Payer: Self-pay | Admitting: Family Medicine

## 2020-08-24 VITALS — BP 110/64 | HR 62

## 2020-08-24 DIAGNOSIS — I4819 Other persistent atrial fibrillation: Secondary | ICD-10-CM | POA: Diagnosis not present

## 2020-08-24 DIAGNOSIS — Z8673 Personal history of transient ischemic attack (TIA), and cerebral infarction without residual deficits: Secondary | ICD-10-CM

## 2020-08-24 DIAGNOSIS — I429 Cardiomyopathy, unspecified: Secondary | ICD-10-CM

## 2020-08-24 NOTE — Patient Instructions (Signed)
Your physician recommends that you schedule a follow-up appointment in: 6 MONTHS WITH DR MCDOWELL  Your physician recommends that you continue on your current medications as directed. Please refer to the Current Medication list given to you today.  Thank you for choosing [facility]!!    

## 2020-08-30 DIAGNOSIS — R300 Dysuria: Secondary | ICD-10-CM | POA: Diagnosis not present

## 2020-09-10 DIAGNOSIS — R945 Abnormal results of liver function studies: Secondary | ICD-10-CM | POA: Diagnosis not present

## 2020-09-10 DIAGNOSIS — E0590 Thyrotoxicosis, unspecified without thyrotoxic crisis or storm: Secondary | ICD-10-CM | POA: Diagnosis not present

## 2020-09-10 DIAGNOSIS — I10 Essential (primary) hypertension: Secondary | ICD-10-CM | POA: Diagnosis not present

## 2020-09-10 DIAGNOSIS — E782 Mixed hyperlipidemia: Secondary | ICD-10-CM | POA: Diagnosis not present

## 2020-09-10 DIAGNOSIS — E876 Hypokalemia: Secondary | ICD-10-CM | POA: Diagnosis not present

## 2020-09-10 DIAGNOSIS — E871 Hypo-osmolality and hyponatremia: Secondary | ICD-10-CM | POA: Diagnosis not present

## 2020-09-10 DIAGNOSIS — E1122 Type 2 diabetes mellitus with diabetic chronic kidney disease: Secondary | ICD-10-CM | POA: Diagnosis not present

## 2020-09-10 DIAGNOSIS — N183 Chronic kidney disease, stage 3 unspecified: Secondary | ICD-10-CM | POA: Diagnosis not present

## 2020-09-10 DIAGNOSIS — K2100 Gastro-esophageal reflux disease with esophagitis, without bleeding: Secondary | ICD-10-CM | POA: Diagnosis not present

## 2020-09-10 DIAGNOSIS — R748 Abnormal levels of other serum enzymes: Secondary | ICD-10-CM | POA: Diagnosis not present

## 2020-09-12 DIAGNOSIS — I69351 Hemiplegia and hemiparesis following cerebral infarction affecting right dominant side: Secondary | ICD-10-CM | POA: Diagnosis not present

## 2020-09-12 DIAGNOSIS — E1122 Type 2 diabetes mellitus with diabetic chronic kidney disease: Secondary | ICD-10-CM | POA: Diagnosis not present

## 2020-09-12 DIAGNOSIS — R569 Unspecified convulsions: Secondary | ICD-10-CM | POA: Diagnosis not present

## 2020-09-12 DIAGNOSIS — I10 Essential (primary) hypertension: Secondary | ICD-10-CM | POA: Diagnosis not present

## 2020-09-12 DIAGNOSIS — I700 Atherosclerosis of aorta: Secondary | ICD-10-CM | POA: Diagnosis not present

## 2020-09-12 DIAGNOSIS — I5032 Chronic diastolic (congestive) heart failure: Secondary | ICD-10-CM | POA: Diagnosis not present

## 2020-09-12 DIAGNOSIS — R4582 Worries: Secondary | ICD-10-CM | POA: Diagnosis not present

## 2020-09-12 DIAGNOSIS — D692 Other nonthrombocytopenic purpura: Secondary | ICD-10-CM | POA: Diagnosis not present

## 2020-09-14 DIAGNOSIS — I130 Hypertensive heart and chronic kidney disease with heart failure and stage 1 through stage 4 chronic kidney disease, or unspecified chronic kidney disease: Secondary | ICD-10-CM | POA: Diagnosis not present

## 2020-09-14 DIAGNOSIS — I5032 Chronic diastolic (congestive) heart failure: Secondary | ICD-10-CM | POA: Diagnosis not present

## 2020-09-14 DIAGNOSIS — E1122 Type 2 diabetes mellitus with diabetic chronic kidney disease: Secondary | ICD-10-CM | POA: Diagnosis not present

## 2020-09-14 DIAGNOSIS — N184 Chronic kidney disease, stage 4 (severe): Secondary | ICD-10-CM | POA: Diagnosis not present

## 2020-09-18 IMAGING — DX DG KNEE COMPLETE 4+V*L*
4 series · 4 of 4 positions shown · non-contrast
Comparison: None.

CLINICAL DATA: 84-year-old female status post fall at home.

EXAM:
LEFT KNEE - COMPLETE 4+ VIEW

[knee ap (1 of 3)]
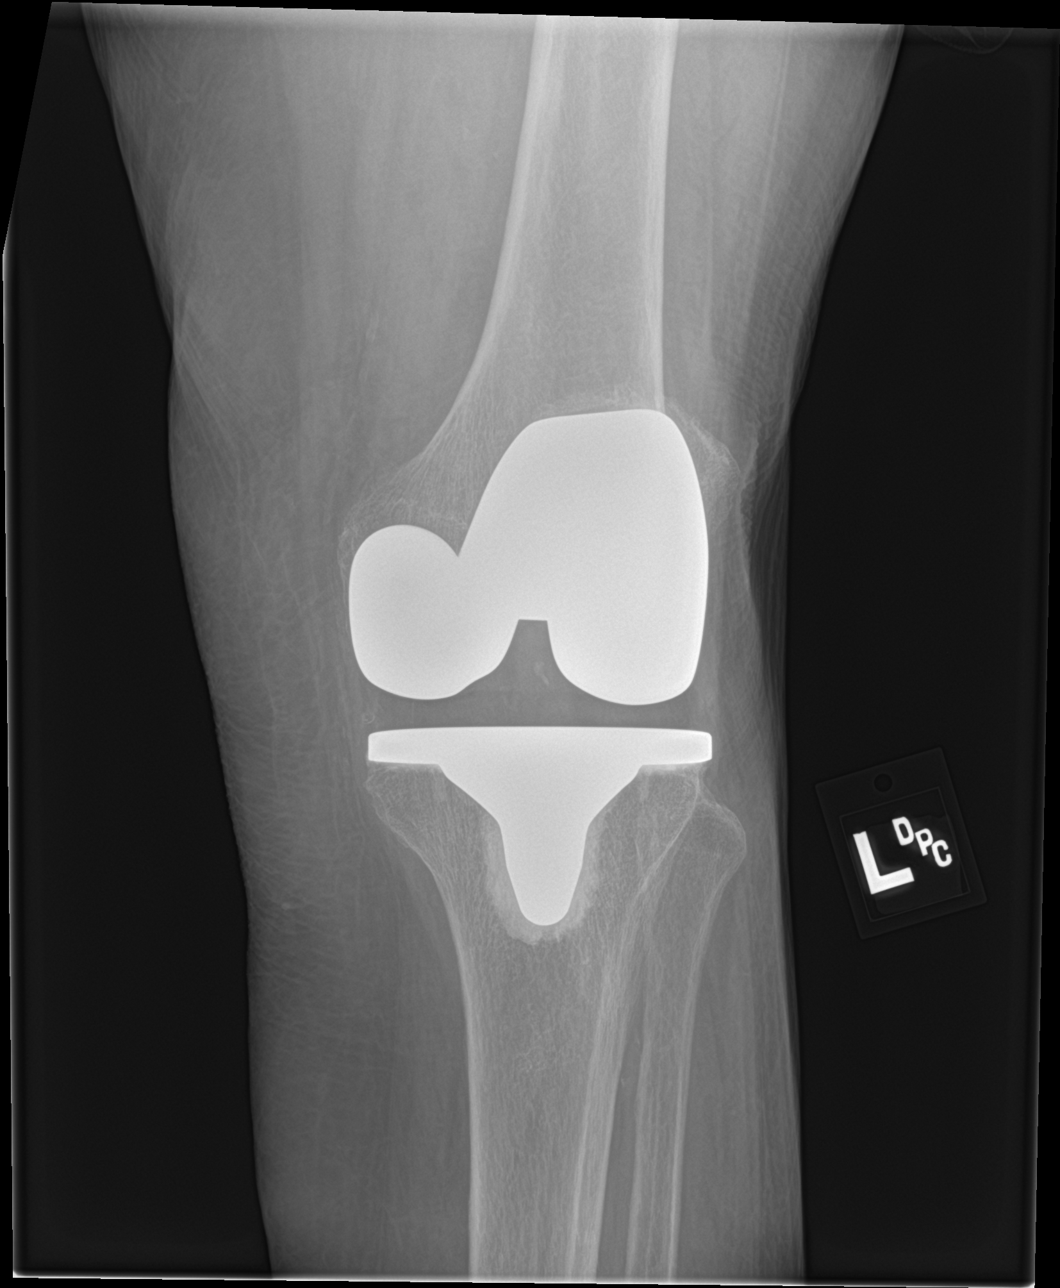

[knee ap (2 of 3)]
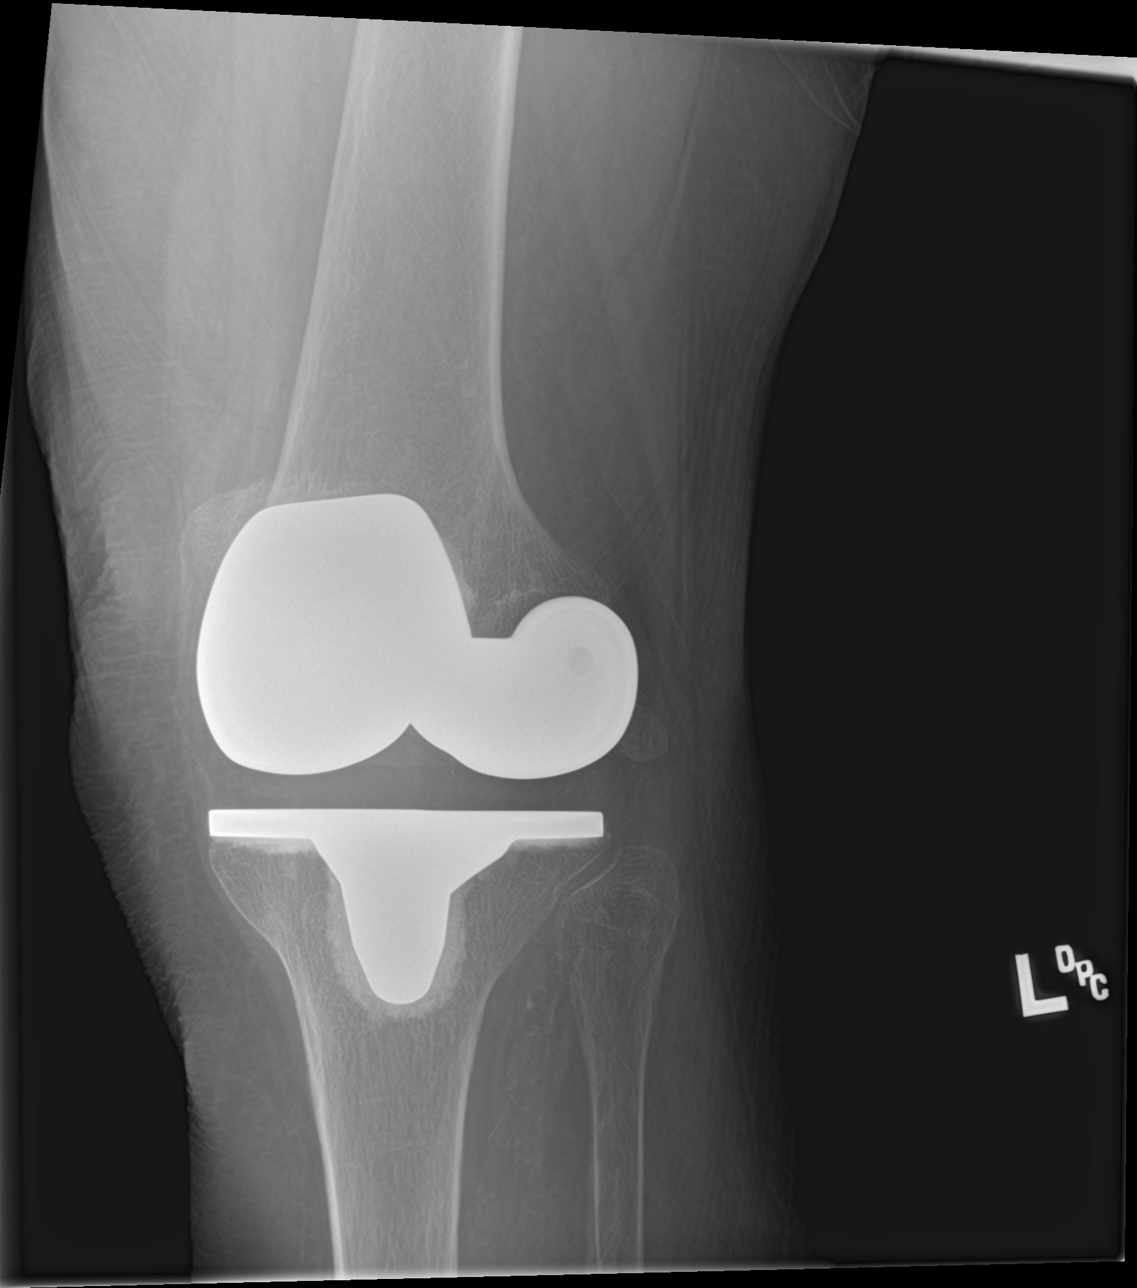

[knee ap (3 of 3)]
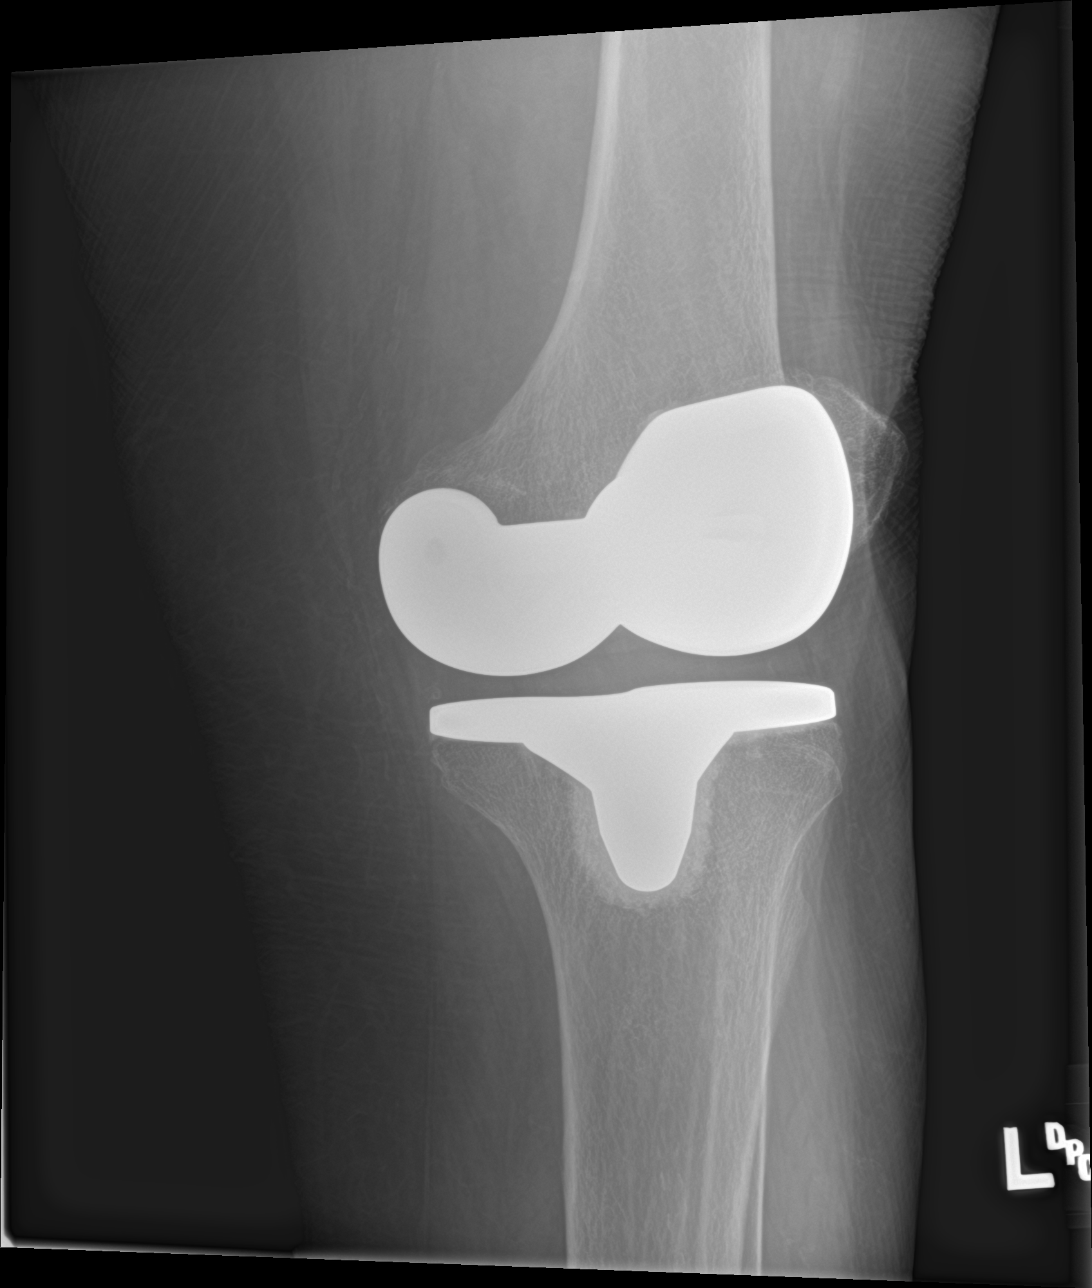

[knee lat]
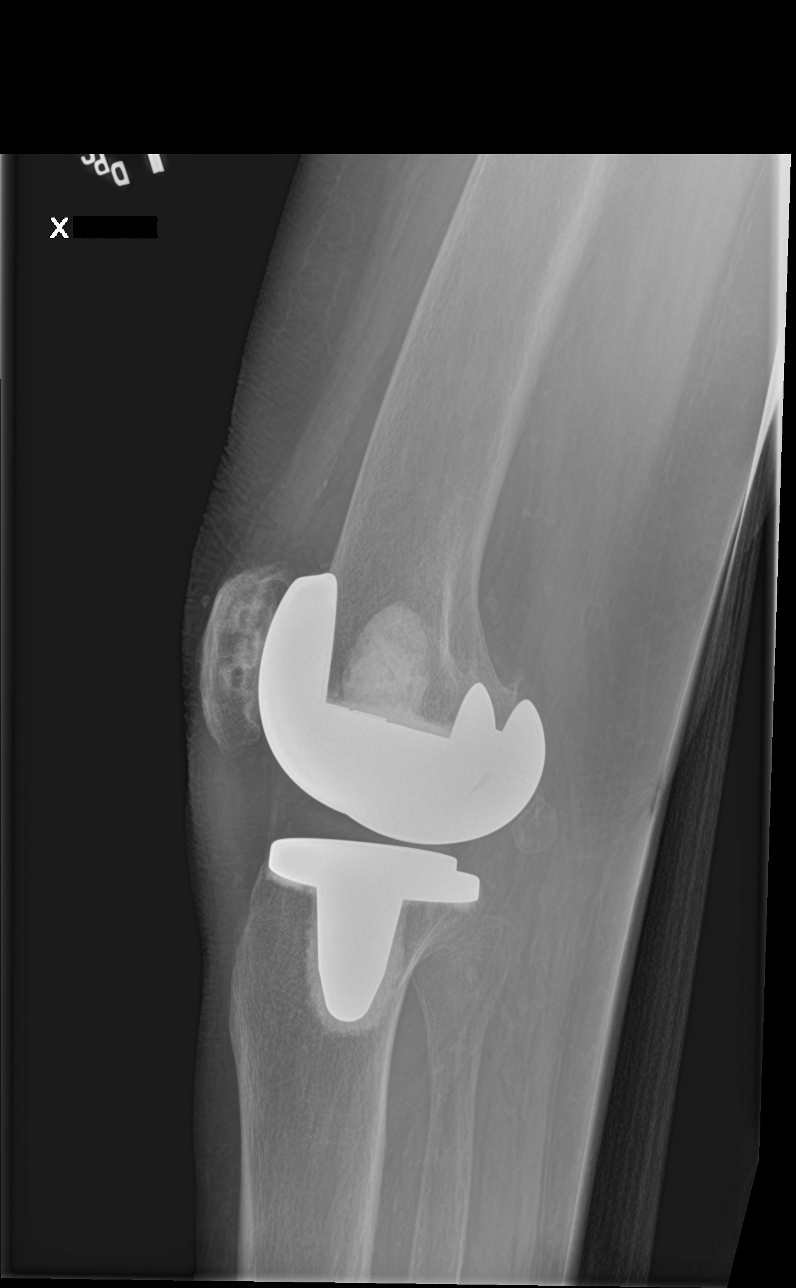

[4 of 4 positions shown; findings below may reference images not displayed]

FINDINGS: Previous left total knee arthroplasty. No evidence of joint effusion
on the cross-table lateral view. Hardware appears intact and
normally aligned. Osteopenia. No acute osseous abnormality
identified. Calcified peripheral vascular disease.
IMPRESSION: No acute fracture or dislocation identified about the left knee.

## 2020-09-18 IMAGING — CT CT CERVICAL SPINE W/O CM
5 of 8 series · 14 of 33 positions shown, 15 images · non-contrast
Comparison: Brain MRI 07/02/2018. [HOSPITAL] [facility] Xion[facility] CT
10/06/2017.

CLINICAL DATA: 84-year-old female status post fall at home.

EXAM:
CT HEAD WITHOUT CONTRAST
CT CERVICAL SPINE WITHOUT CONTRAST
TECHNIQUE: Multidetector CT imaging of the head and cervical spine was
performed following the standard protocol without intravenous
contrast. Multiplanar CT image reconstructions of the cervical spine
were also generated.

[Series 4: head bone · axial · 0.48mm/px · z∈[+1441,+1493]mm · 2 of 78 slices shown]
[im 26/78  bone]
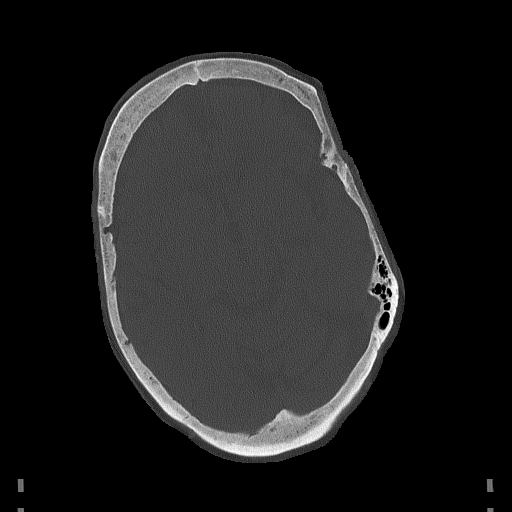
[im 52/78  bone]
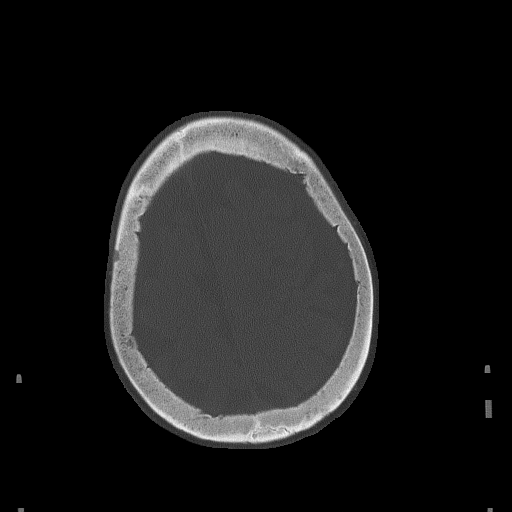

[Series 5: coronal soft · coronal · 0.30mm/px · 3 of 74 slices shown]
[im 19/74  bone]
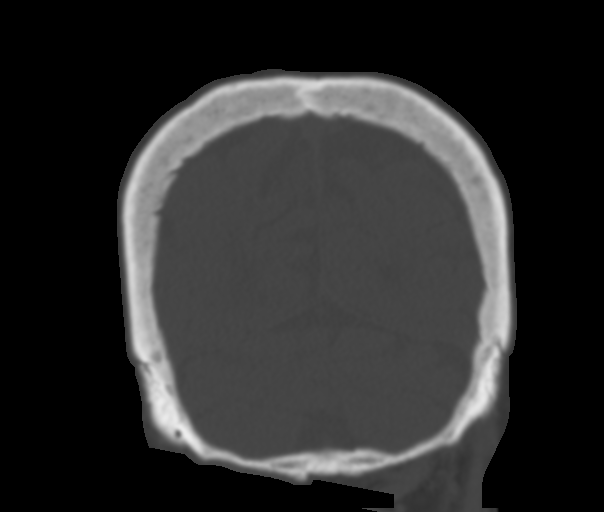
[im 37/74  bone]
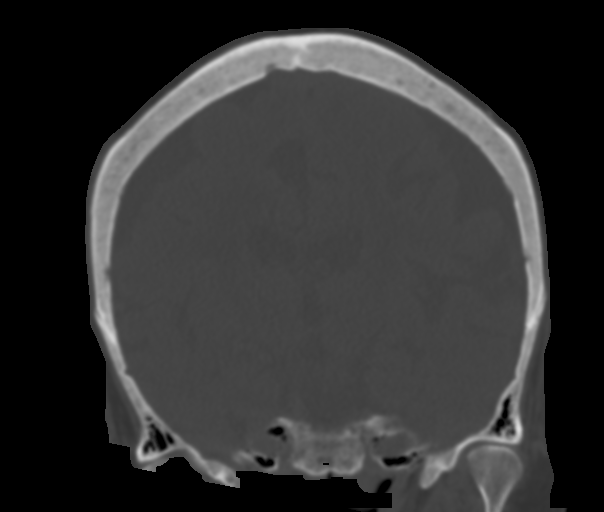
[im 55/74  bone]
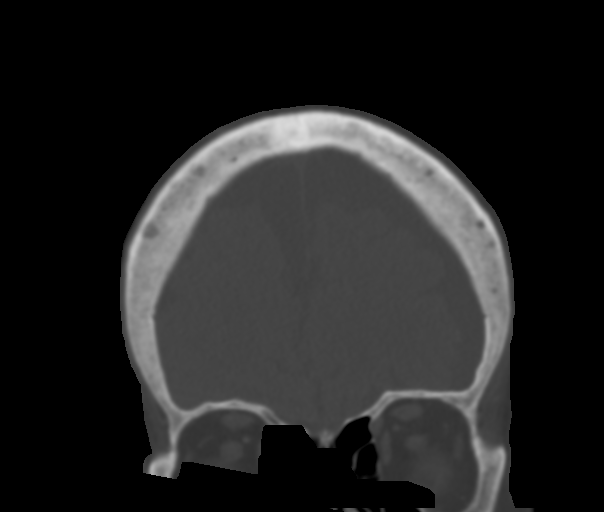

[Series 8: c spine soft · axial · 0.36mm/px · z∈[+1302,+1352]mm · 2 of 77 slices shown]
[im 26/77  soft-tissue]
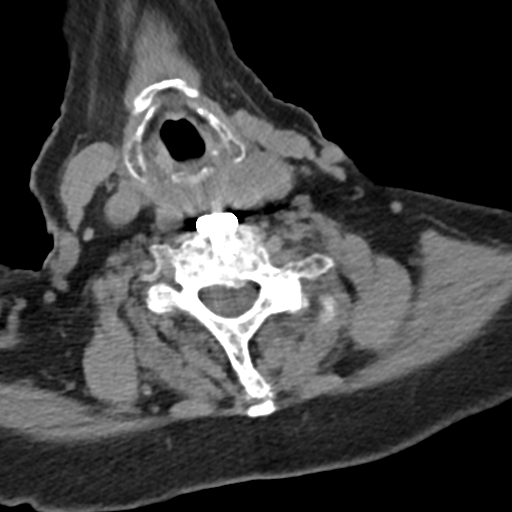
[im 51/77  soft-tissue]
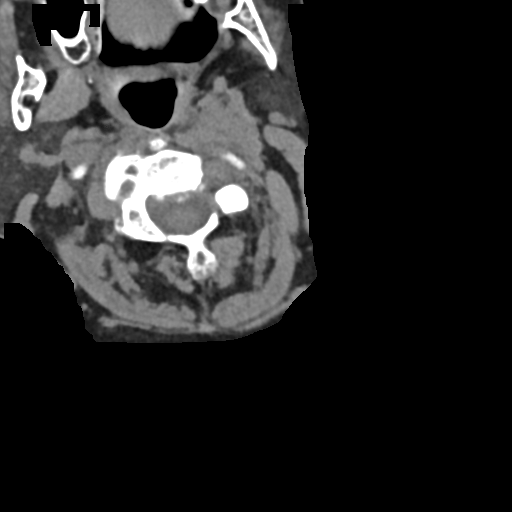

[Series 9: sagittal bone · sagittal · 0.23mm/px · 5 of 61 slices shown]
[im 11/61  bone]
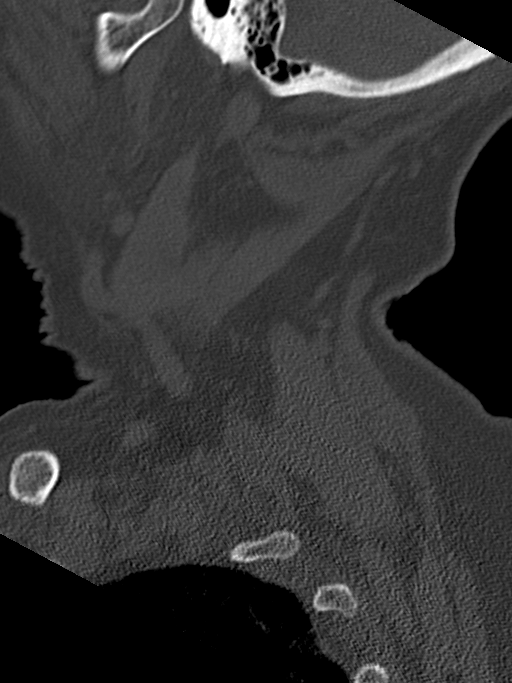
[im 21/61  bone]
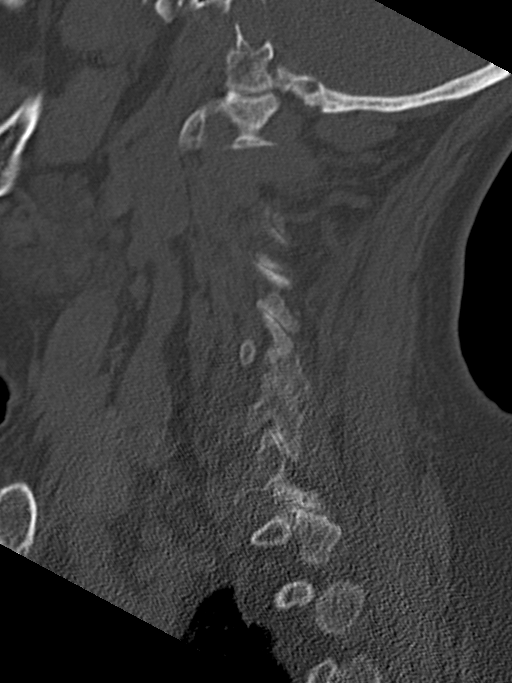
[im 31/61  bone]
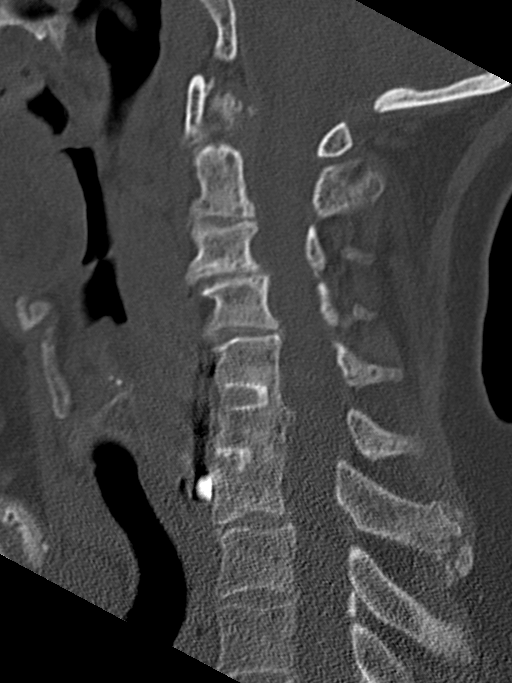
[im 41/61  bone]
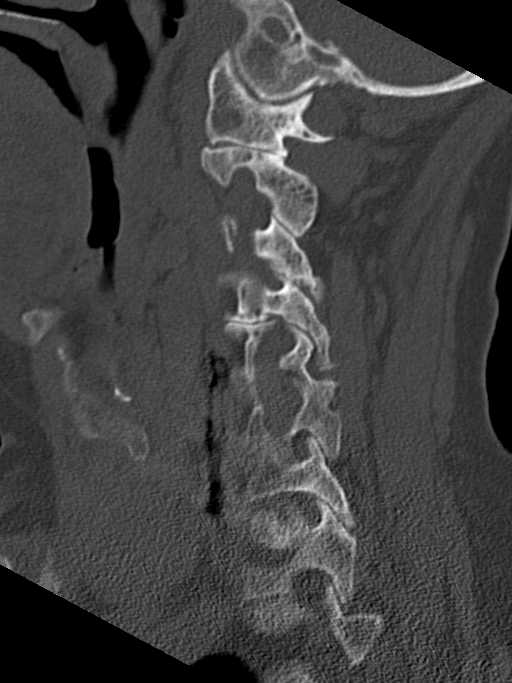
[im 51/61  bone]
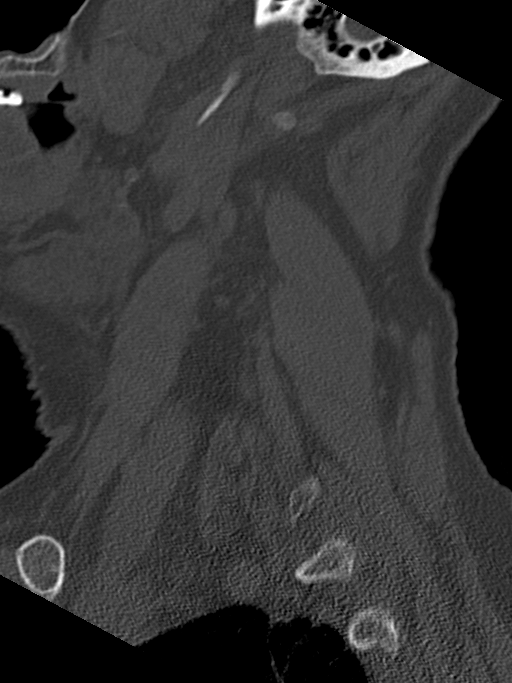

[Series 11: orthogonal axials · axial · 0.21mm/px · z∈[+1268,+1314]mm · 2 of 88 slices shown, 3 images]
[im 30/88  soft-tissue]
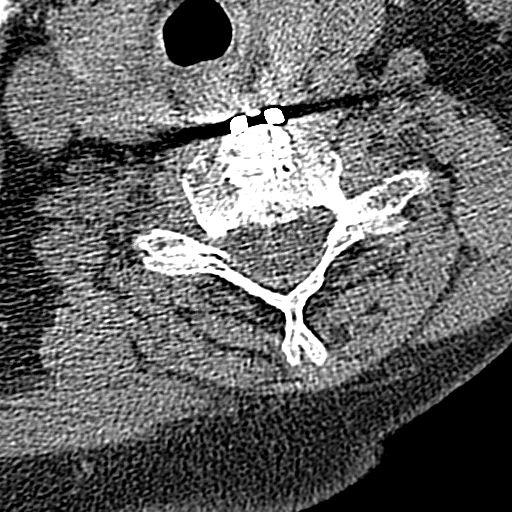
[im 30/88  bone]
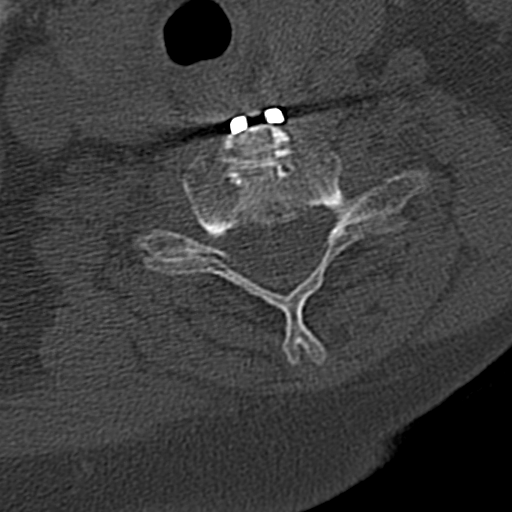
[im 59/88  bone]
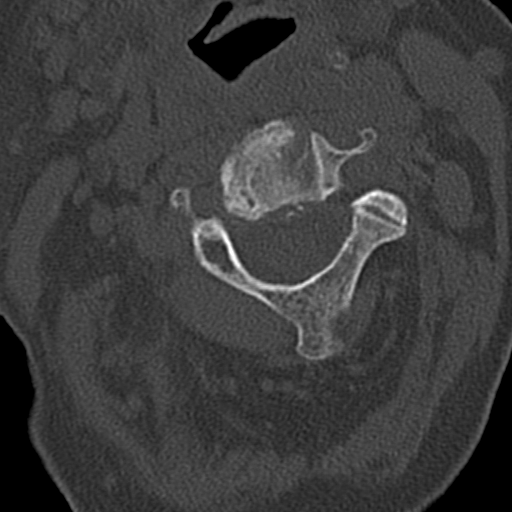

[14 of 33 positions shown; findings below may reference images not displayed]

FINDINGS: CT HEAD FINDINGS

Brain: Cerebral volume is not significantly changed since 0880.
Chronic bilateral white matter disease and deep gray nuclei lacunar
infarcts.

No midline shift, ventriculomegaly, mass effect, evidence of mass
lesion, intracranial hemorrhage or evidence of cortically based
acute infarction.

Vascular: Calcified atherosclerosis at the skull base. No suspicious
intracranial vascular hyperdensity.

Skull: Stable and intact. Hyperostosis.

Sinuses/Orbits: Visualized paranasal sinuses and mastoids are stable
and generally well pneumatized.

Other: No scalp hematoma. No acute orbit or scalp soft tissue
finding.

CT CERVICAL SPINE FINDINGS

Alignment: Degenerative appearing mild anterolisthesis at C3-C4 with
associated facet degeneration. Straightening elsewhere.
Cervicothoracic junction alignment is within normal limits.
Bilateral posterior element alignment is within normal limits.

Skull base and vertebrae: Visualized skull base is intact. No
atlanto-occipital dissociation. Osteopenia. No acute osseous
abnormality identified.

Soft tissues and spinal canal: No prevertebral fluid or swelling. No
visible canal hematoma. Partially retropharyngeal course of both
carotids. Thyroid goiter.

Disc levels: C5 through C7 ACDF with solid arthrodesis. Advanced
cervical spine degeneration elsewhere. Up to mild associated spinal
stenosis.

Upper chest: Visible upper thoracic levels appear intact. Mild
apical lung scarring.
IMPRESSION: 1. No acute traumatic injury identified in the head or cervical
spine.
2. Chronic small-vessel ischemia. No acute intracranial abnormality.
3. C5 through C7 ACDF with solid arthrodesis and advanced cervical
spine degeneration elsewhere.

## 2020-09-21 DIAGNOSIS — I509 Heart failure, unspecified: Secondary | ICD-10-CM | POA: Diagnosis not present

## 2020-09-21 DIAGNOSIS — F331 Major depressive disorder, recurrent, moderate: Secondary | ICD-10-CM | POA: Diagnosis not present

## 2020-09-21 DIAGNOSIS — E1122 Type 2 diabetes mellitus with diabetic chronic kidney disease: Secondary | ICD-10-CM | POA: Diagnosis not present

## 2020-09-21 DIAGNOSIS — I4891 Unspecified atrial fibrillation: Secondary | ICD-10-CM | POA: Diagnosis not present

## 2020-09-21 DIAGNOSIS — I69351 Hemiplegia and hemiparesis following cerebral infarction affecting right dominant side: Secondary | ICD-10-CM | POA: Diagnosis not present

## 2020-09-21 DIAGNOSIS — E46 Unspecified protein-calorie malnutrition: Secondary | ICD-10-CM | POA: Diagnosis not present

## 2020-09-21 DIAGNOSIS — J449 Chronic obstructive pulmonary disease, unspecified: Secondary | ICD-10-CM | POA: Diagnosis not present

## 2020-09-21 DIAGNOSIS — D692 Other nonthrombocytopenic purpura: Secondary | ICD-10-CM | POA: Diagnosis not present

## 2020-09-21 DIAGNOSIS — F0390 Unspecified dementia without behavioral disturbance: Secondary | ICD-10-CM | POA: Diagnosis not present

## 2020-10-13 DIAGNOSIS — I5032 Chronic diastolic (congestive) heart failure: Secondary | ICD-10-CM | POA: Diagnosis not present

## 2020-10-13 DIAGNOSIS — E7849 Other hyperlipidemia: Secondary | ICD-10-CM | POA: Diagnosis not present

## 2020-10-13 DIAGNOSIS — G301 Alzheimer's disease with late onset: Secondary | ICD-10-CM | POA: Diagnosis not present

## 2020-10-13 DIAGNOSIS — N184 Chronic kidney disease, stage 4 (severe): Secondary | ICD-10-CM | POA: Diagnosis not present

## 2020-10-13 DIAGNOSIS — F0280 Dementia in other diseases classified elsewhere without behavioral disturbance: Secondary | ICD-10-CM | POA: Diagnosis not present

## 2020-10-13 DIAGNOSIS — E1122 Type 2 diabetes mellitus with diabetic chronic kidney disease: Secondary | ICD-10-CM | POA: Diagnosis not present

## 2020-10-13 DIAGNOSIS — I130 Hypertensive heart and chronic kidney disease with heart failure and stage 1 through stage 4 chronic kidney disease, or unspecified chronic kidney disease: Secondary | ICD-10-CM | POA: Diagnosis not present

## 2020-10-24 DIAGNOSIS — R82998 Other abnormal findings in urine: Secondary | ICD-10-CM | POA: Diagnosis not present

## 2020-10-24 DIAGNOSIS — N390 Urinary tract infection, site not specified: Secondary | ICD-10-CM | POA: Diagnosis not present

## 2020-10-30 DIAGNOSIS — R4182 Altered mental status, unspecified: Secondary | ICD-10-CM | POA: Diagnosis not present

## 2020-10-30 DIAGNOSIS — E1165 Type 2 diabetes mellitus with hyperglycemia: Secondary | ICD-10-CM | POA: Diagnosis not present

## 2020-10-30 DIAGNOSIS — N390 Urinary tract infection, site not specified: Secondary | ICD-10-CM | POA: Diagnosis not present

## 2020-10-30 DIAGNOSIS — R410 Disorientation, unspecified: Secondary | ICD-10-CM | POA: Diagnosis not present

## 2020-11-01 DIAGNOSIS — Z7901 Long term (current) use of anticoagulants: Secondary | ICD-10-CM | POA: Diagnosis not present

## 2020-11-01 DIAGNOSIS — D692 Other nonthrombocytopenic purpura: Secondary | ICD-10-CM | POA: Diagnosis not present

## 2020-11-01 DIAGNOSIS — N390 Urinary tract infection, site not specified: Secondary | ICD-10-CM | POA: Diagnosis not present

## 2020-11-05 DIAGNOSIS — N390 Urinary tract infection, site not specified: Secondary | ICD-10-CM | POA: Diagnosis not present

## 2020-11-12 DIAGNOSIS — I130 Hypertensive heart and chronic kidney disease with heart failure and stage 1 through stage 4 chronic kidney disease, or unspecified chronic kidney disease: Secondary | ICD-10-CM | POA: Diagnosis not present

## 2020-11-12 DIAGNOSIS — I5032 Chronic diastolic (congestive) heart failure: Secondary | ICD-10-CM | POA: Diagnosis not present

## 2020-11-12 DIAGNOSIS — N184 Chronic kidney disease, stage 4 (severe): Secondary | ICD-10-CM | POA: Diagnosis not present

## 2020-11-12 DIAGNOSIS — E7849 Other hyperlipidemia: Secondary | ICD-10-CM | POA: Diagnosis not present

## 2020-11-12 DIAGNOSIS — G301 Alzheimer's disease with late onset: Secondary | ICD-10-CM | POA: Diagnosis not present

## 2020-11-12 DIAGNOSIS — F0280 Dementia in other diseases classified elsewhere without behavioral disturbance: Secondary | ICD-10-CM | POA: Diagnosis not present

## 2020-11-12 DIAGNOSIS — E1122 Type 2 diabetes mellitus with diabetic chronic kidney disease: Secondary | ICD-10-CM | POA: Diagnosis not present

## 2020-12-07 DIAGNOSIS — Z96653 Presence of artificial knee joint, bilateral: Secondary | ICD-10-CM | POA: Diagnosis not present

## 2020-12-07 DIAGNOSIS — I10 Essential (primary) hypertension: Secondary | ICD-10-CM | POA: Diagnosis not present

## 2020-12-07 DIAGNOSIS — K2100 Gastro-esophageal reflux disease with esophagitis, without bleeding: Secondary | ICD-10-CM | POA: Diagnosis not present

## 2020-12-07 DIAGNOSIS — F330 Major depressive disorder, recurrent, mild: Secondary | ICD-10-CM | POA: Diagnosis not present

## 2020-12-07 DIAGNOSIS — Z515 Encounter for palliative care: Secondary | ICD-10-CM | POA: Diagnosis not present

## 2020-12-07 DIAGNOSIS — F1120 Opioid dependence, uncomplicated: Secondary | ICD-10-CM | POA: Diagnosis not present

## 2020-12-07 DIAGNOSIS — Z9889 Other specified postprocedural states: Secondary | ICD-10-CM | POA: Diagnosis not present

## 2020-12-12 DIAGNOSIS — N184 Chronic kidney disease, stage 4 (severe): Secondary | ICD-10-CM | POA: Diagnosis not present

## 2020-12-12 DIAGNOSIS — I5032 Chronic diastolic (congestive) heart failure: Secondary | ICD-10-CM | POA: Diagnosis not present

## 2020-12-12 DIAGNOSIS — I130 Hypertensive heart and chronic kidney disease with heart failure and stage 1 through stage 4 chronic kidney disease, or unspecified chronic kidney disease: Secondary | ICD-10-CM | POA: Diagnosis not present

## 2020-12-12 DIAGNOSIS — F0280 Dementia in other diseases classified elsewhere without behavioral disturbance: Secondary | ICD-10-CM | POA: Diagnosis not present

## 2020-12-12 DIAGNOSIS — E1122 Type 2 diabetes mellitus with diabetic chronic kidney disease: Secondary | ICD-10-CM | POA: Diagnosis not present

## 2020-12-12 DIAGNOSIS — G301 Alzheimer's disease with late onset: Secondary | ICD-10-CM | POA: Diagnosis not present

## 2020-12-12 DIAGNOSIS — E7849 Other hyperlipidemia: Secondary | ICD-10-CM | POA: Diagnosis not present

## 2020-12-18 DIAGNOSIS — E1122 Type 2 diabetes mellitus with diabetic chronic kidney disease: Secondary | ICD-10-CM | POA: Diagnosis not present

## 2020-12-18 DIAGNOSIS — I4891 Unspecified atrial fibrillation: Secondary | ICD-10-CM | POA: Diagnosis not present

## 2020-12-18 DIAGNOSIS — J189 Pneumonia, unspecified organism: Secondary | ICD-10-CM | POA: Diagnosis not present

## 2020-12-18 DIAGNOSIS — F331 Major depressive disorder, recurrent, moderate: Secondary | ICD-10-CM | POA: Diagnosis not present

## 2020-12-18 DIAGNOSIS — R059 Cough, unspecified: Secondary | ICD-10-CM | POA: Diagnosis not present

## 2020-12-18 DIAGNOSIS — R4182 Altered mental status, unspecified: Secondary | ICD-10-CM | POA: Diagnosis not present

## 2020-12-18 DIAGNOSIS — G40309 Generalized idiopathic epilepsy and epileptic syndromes, not intractable, without status epilepticus: Secondary | ICD-10-CM | POA: Diagnosis not present

## 2020-12-18 DIAGNOSIS — J690 Pneumonitis due to inhalation of food and vomit: Secondary | ICD-10-CM | POA: Diagnosis not present

## 2020-12-18 DIAGNOSIS — D631 Anemia in chronic kidney disease: Secondary | ICD-10-CM | POA: Diagnosis not present

## 2020-12-18 DIAGNOSIS — I69354 Hemiplegia and hemiparesis following cerebral infarction affecting left non-dominant side: Secondary | ICD-10-CM | POA: Diagnosis not present

## 2020-12-18 DIAGNOSIS — E869 Volume depletion, unspecified: Secondary | ICD-10-CM | POA: Diagnosis not present

## 2020-12-18 DIAGNOSIS — Z20822 Contact with and (suspected) exposure to covid-19: Secondary | ICD-10-CM | POA: Diagnosis not present

## 2020-12-18 DIAGNOSIS — D692 Other nonthrombocytopenic purpura: Secondary | ICD-10-CM | POA: Diagnosis not present

## 2020-12-18 DIAGNOSIS — R471 Dysarthria and anarthria: Secondary | ICD-10-CM | POA: Diagnosis not present

## 2020-12-28 DIAGNOSIS — Z8701 Personal history of pneumonia (recurrent): Secondary | ICD-10-CM | POA: Diagnosis not present

## 2020-12-28 DIAGNOSIS — Z7401 Bed confinement status: Secondary | ICD-10-CM | POA: Diagnosis not present

## 2020-12-28 DIAGNOSIS — Z09 Encounter for follow-up examination after completed treatment for conditions other than malignant neoplasm: Secondary | ICD-10-CM | POA: Diagnosis not present

## 2021-01-09 DIAGNOSIS — D649 Anemia, unspecified: Secondary | ICD-10-CM | POA: Diagnosis not present

## 2021-01-09 DIAGNOSIS — Z131 Encounter for screening for diabetes mellitus: Secondary | ICD-10-CM | POA: Diagnosis not present

## 2021-01-09 DIAGNOSIS — E782 Mixed hyperlipidemia: Secondary | ICD-10-CM | POA: Diagnosis not present

## 2021-01-09 DIAGNOSIS — N184 Chronic kidney disease, stage 4 (severe): Secondary | ICD-10-CM | POA: Diagnosis not present

## 2021-01-12 DIAGNOSIS — F0280 Dementia in other diseases classified elsewhere without behavioral disturbance: Secondary | ICD-10-CM | POA: Diagnosis not present

## 2021-01-12 DIAGNOSIS — G301 Alzheimer's disease with late onset: Secondary | ICD-10-CM | POA: Diagnosis not present

## 2021-01-12 DIAGNOSIS — N184 Chronic kidney disease, stage 4 (severe): Secondary | ICD-10-CM | POA: Diagnosis not present

## 2021-01-12 DIAGNOSIS — I130 Hypertensive heart and chronic kidney disease with heart failure and stage 1 through stage 4 chronic kidney disease, or unspecified chronic kidney disease: Secondary | ICD-10-CM | POA: Diagnosis not present

## 2021-01-12 DIAGNOSIS — E1122 Type 2 diabetes mellitus with diabetic chronic kidney disease: Secondary | ICD-10-CM | POA: Diagnosis not present

## 2021-01-12 DIAGNOSIS — I5032 Chronic diastolic (congestive) heart failure: Secondary | ICD-10-CM | POA: Diagnosis not present

## 2021-01-12 DIAGNOSIS — E7849 Other hyperlipidemia: Secondary | ICD-10-CM | POA: Diagnosis not present

## 2021-01-16 DIAGNOSIS — I482 Chronic atrial fibrillation, unspecified: Secondary | ICD-10-CM | POA: Diagnosis not present

## 2021-01-16 DIAGNOSIS — R569 Unspecified convulsions: Secondary | ICD-10-CM | POA: Diagnosis not present

## 2021-01-16 DIAGNOSIS — E0590 Thyrotoxicosis, unspecified without thyrotoxic crisis or storm: Secondary | ICD-10-CM | POA: Diagnosis not present

## 2021-01-16 DIAGNOSIS — I10 Essential (primary) hypertension: Secondary | ICD-10-CM | POA: Diagnosis not present

## 2021-01-16 DIAGNOSIS — I700 Atherosclerosis of aorta: Secondary | ICD-10-CM | POA: Diagnosis not present

## 2021-01-16 DIAGNOSIS — E7849 Other hyperlipidemia: Secondary | ICD-10-CM | POA: Diagnosis not present

## 2021-01-16 DIAGNOSIS — R4582 Worries: Secondary | ICD-10-CM | POA: Diagnosis not present

## 2021-01-16 DIAGNOSIS — I5032 Chronic diastolic (congestive) heart failure: Secondary | ICD-10-CM | POA: Diagnosis not present

## 2021-01-16 DIAGNOSIS — E1122 Type 2 diabetes mellitus with diabetic chronic kidney disease: Secondary | ICD-10-CM | POA: Diagnosis not present

## 2021-01-20 DIAGNOSIS — J690 Pneumonitis due to inhalation of food and vomit: Secondary | ICD-10-CM | POA: Diagnosis not present

## 2021-01-20 DIAGNOSIS — I5032 Chronic diastolic (congestive) heart failure: Secondary | ICD-10-CM | POA: Diagnosis not present

## 2021-01-20 DIAGNOSIS — G301 Alzheimer's disease with late onset: Secondary | ICD-10-CM | POA: Diagnosis not present

## 2021-01-20 DIAGNOSIS — F331 Major depressive disorder, recurrent, moderate: Secondary | ICD-10-CM | POA: Diagnosis not present

## 2021-01-20 DIAGNOSIS — I482 Chronic atrial fibrillation, unspecified: Secondary | ICD-10-CM | POA: Diagnosis not present

## 2021-01-28 DIAGNOSIS — N183 Chronic kidney disease, stage 3 unspecified: Secondary | ICD-10-CM | POA: Diagnosis not present

## 2021-01-28 DIAGNOSIS — Z7984 Long term (current) use of oral hypoglycemic drugs: Secondary | ICD-10-CM | POA: Diagnosis not present

## 2021-01-28 DIAGNOSIS — Z515 Encounter for palliative care: Secondary | ICD-10-CM | POA: Diagnosis not present

## 2021-01-28 DIAGNOSIS — J449 Chronic obstructive pulmonary disease, unspecified: Secondary | ICD-10-CM | POA: Diagnosis not present

## 2021-01-28 DIAGNOSIS — Z7901 Long term (current) use of anticoagulants: Secondary | ICD-10-CM | POA: Diagnosis not present

## 2021-01-28 DIAGNOSIS — D692 Other nonthrombocytopenic purpura: Secondary | ICD-10-CM | POA: Diagnosis not present

## 2021-01-28 DIAGNOSIS — F330 Major depressive disorder, recurrent, mild: Secondary | ICD-10-CM | POA: Diagnosis not present

## 2021-01-28 DIAGNOSIS — E1122 Type 2 diabetes mellitus with diabetic chronic kidney disease: Secondary | ICD-10-CM | POA: Diagnosis not present

## 2021-02-11 DIAGNOSIS — I5032 Chronic diastolic (congestive) heart failure: Secondary | ICD-10-CM | POA: Diagnosis not present

## 2021-02-11 DIAGNOSIS — F0280 Dementia in other diseases classified elsewhere without behavioral disturbance: Secondary | ICD-10-CM | POA: Diagnosis not present

## 2021-02-11 DIAGNOSIS — I130 Hypertensive heart and chronic kidney disease with heart failure and stage 1 through stage 4 chronic kidney disease, or unspecified chronic kidney disease: Secondary | ICD-10-CM | POA: Diagnosis not present

## 2021-02-11 DIAGNOSIS — E1122 Type 2 diabetes mellitus with diabetic chronic kidney disease: Secondary | ICD-10-CM | POA: Diagnosis not present

## 2021-02-11 DIAGNOSIS — E7849 Other hyperlipidemia: Secondary | ICD-10-CM | POA: Diagnosis not present

## 2021-02-11 DIAGNOSIS — N184 Chronic kidney disease, stage 4 (severe): Secondary | ICD-10-CM | POA: Diagnosis not present

## 2021-02-11 DIAGNOSIS — G301 Alzheimer's disease with late onset: Secondary | ICD-10-CM | POA: Diagnosis not present

## 2021-02-20 ENCOUNTER — Ambulatory Visit: Payer: Medicare HMO | Admitting: Cardiology

## 2021-02-20 NOTE — Progress Notes (Deleted)
Cardiology Office Note  Date: 02/20/2021   ID: Norma [name], DOB May 14, 1934, MRN [id number]  PCP:  Caryl Bis, MD  Cardiologist:  Rozann Lesches, MD Electrophysiologist:  None   No chief complaint on file.   History of Present Illness: Norma [name] is an 87 y.o. female last seen in December 2021 by Mr. Leonides Sake NP.  Past Medical History:  Diagnosis Date  . Anemia of chronic disease   . Atrial fibrillation ([place])   . Atypical atrial flutter ([place])   . CAD (coronary artery disease)    a. cath 2008 - 30% prox Cx, 70% distal Cx treated medically.  . Chronic pain syndrome   . CKD (chronic kidney disease), stage III ([place])   . History of blood transfusion 1960's  . Pneumonia   . Secondary cardiomyopathy ([place])   . Stroke [facility]) ~ 2011  . Takotsubo cardiomyopathy    LVEF was 20-25%, but normalized in 2018.  . Type II diabetes mellitus ([place])     Past Surgical History:  Procedure Laterality Date  . ABDOMINAL HYSTERECTOMY    . APPENDECTOMY    . CARDIOVERSION N/A 06/08/2019   Procedure: CARDIOVERSION;  Surgeon: Donato Heinz, MD;  Location[facility] ENDOSCOPY;  Service: Endoscopy;  Laterality: N/A;  . CATARACT EXTRACTION, BILATERAL    . CHOLECYSTECTOMY    . DILATION AND CURETTAGE OF UTERUS    . ESOPHAGOGASTRODUODENOSCOPY (EGD) WITH PROPOFOL N/A 04/17/2019   Procedure: ESOPHAGOGASTRODUODENOSCOPY (EGD) WITH PROPOFOL;  Surgeon: Otis Brace, MD;  Location: [place];  Service: Gastroenterology;  Laterality: N/A;  . ESOPHAGOGASTRODUODENOSCOPY (EGD) WITH PROPOFOL Left 02/20/2020   Procedure: ESOPHAGOGASTRODUODENOSCOPY (EGD) WITH PROPOFOL;  Surgeon: Wilford Corner, MD;  Location: [place];  Service: Endoscopy;  Laterality: Left;  . FOREIGN BODY REMOVAL  04/17/2019   Procedure: FOREIGN BODY REMOVAL;  Surgeon: Otis Brace, MD;  Location: [place];  Service: Gastroenterology;;  . PATELLA RECONSTRUCTION Bilateral    "had my knee caps replaced" (12/23/2012)  .  TONSILLECTOMY      Current Outpatient Medications  Medication Sig Dispense Refill  . apixaban (ELIQUIS) 2.5 MG TABS tablet Take 1 tablet (2.5 mg total) by mouth 2 (two) times daily. 60 tablet 2  . azithromycin (ZITHROMAX) 250 MG tablet     . buPROPion (WELLBUTRIN XL) 300 MG 24 hr tablet Take 300 mg by mouth daily.     . carvedilol (COREG) 25 MG tablet Take 1 tablet (25 mg total) by mouth 2 (two) times daily with a meal. 60 tablet 6  . cephALEXin (KEFLEX) 500 MG capsule     . cholecalciferol (VITAMIN D) 25 MCG (1000 UNIT) tablet Take 2,000 Units by mouth daily.    . DULoxetine (CYMBALTA) 60 MG capsule Take 60 mg by mouth daily.    . ergocalciferol (VITAMIN D2) 1.25 MG (50000 UT) capsule Take 50,000 Units by mouth once a week.    Marland Kitchen glipiZIDE (GLUCOTROL XL) 2.5 MG 24 hr tablet Take 2.5 mg by mouth daily with breakfast.    . insulin detemir (LEVEMIR) 100 UNIT/ML injection Inject 0.1 mLs (10 Units total) into the skin at bedtime. (Patient taking differently: Inject 8-22 Units into the skin as needed. Per sliding scale) 10 mL 11  . levETIRAcetam (KEPPRA) 500 MG tablet     . losartan (COZAAR) 25 MG tablet Take 25 mg by mouth daily.    . nitrofurantoin, macrocrystal-monohydrate, (MACROBID) 100 MG capsule     . oxyCODONE-acetaminophen (PERCOCET) 10-325 MG tablet Take 1 tablet by mouth  4 (four) times daily as needed for pain. 20 tablet 0  . pantoprazole (PROTONIX) 40 MG tablet Take 1 tablet (40 mg total) by mouth 2 (two) times daily for 60 days, THEN 1 tablet (40 mg total) daily. 150 tablet 0  . saccharomyces boulardii (FLORASTOR) 250 MG capsule Take 1 capsule (250 mg total) 2 (two) times daily by mouth. (Patient taking differently: Take 250 mg by mouth daily.) 21 capsule 0  . simvastatin (ZOCOR) 20 MG tablet Take 20 mg by mouth daily.    . sucralfate (CARAFATE) 1 g tablet Take 1 tablet (1 g total) by mouth 4 (four) times daily -  with meals and at bedtime for 7 days. 28 tablet 0  . ZINC OXIDE, TOPICAL,  (DIAPER RASH) 10 % CREA Apply 1 application topically daily as needed (skin).      No current facility-administered medications for this visit.   Allergies:  Patient has no known allergies.   Social History: The patient  reports that she has never smoked. She has never used smokeless tobacco. She reports that she does not drink alcohol and does not use drugs.   Family History: The patient's family history includes Cancer in her brother and father; Heart attack (age of onset: 32) in her father; Heart disease in her daughter.   ROS:  Please see the history of present illness. Otherwise, complete review of systems is positive for {NONE DEFAULTED:18576::"none"}.  All other systems are reviewed and negative.   Physical Exam: VS:  There were no vitals taken for this visit., BMI There is no height or weight on file to calculate BMI.  Wt Readings from Last 3 Encounters:  02/19/20 130 lb 15.3 oz (59.4 kg)  02/02/20 145 lb (65.8 kg)  12/03/19 134 lb 7.7 oz (61 kg)    General: Patient appears comfortable at rest. HEENT: Conjunctiva and lids normal, oropharynx clear with moist mucosa. Neck: Supple, no elevated JVP or carotid bruits, no thyromegaly. Lungs: Clear to auscultation, nonlabored breathing at rest. Cardiac: Regular rate and rhythm, no S3 or significant systolic murmur, no pericardial rub. Abdomen: Soft, nontender, no hepatomegaly, bowel sounds present, no guarding or rebound. Extremities: No pitting edema, distal pulses 2+. Skin: Warm and dry. Musculoskeletal: No kyphosis. Neuropsychiatric: Alert and oriented x3, affect grossly appropriate.  ECG:  An ECG dated 02/19/2020 was personally reviewed today and demonstrated:  Sinus rhythm with nonspecific ST-T changes.  Recent Labwork: 02/23/2020: BUN 11; Creatinine, Ser 1.09; Hemoglobin 8.1; Magnesium 2.2; Platelets 187; Potassium 3.9; Sodium 137  April 2022: Hemoglobin 8.0, platelets 212, potassium 3.6, BUN 12, creatinine 0.96  Other  Studies Reviewed Today:  Echocardiogram 04/18/2019: 1. The left ventricle has mildly reduced systolic function, with an  ejection fraction of 45-50%. The cavity size was normal. There is mildly  increased left ventricular wall thickness. Left ventricular diastolic  Doppler parameters are indeterminate.  Hypokinesis of the mid anteroseptal and mid inferoseptal wall segments,  unusual pattern.  2. The right ventricle has mildly reduced systolic function. The cavity  was mildly enlarged. There is no increase in right ventricular wall  thickness.  3. Left atrial size was moderately dilated.  4. Right atrial size was mildly dilated.  5. There is mild mitral annular calcification present. No evidence of  mitral valve stenosis. Mild mitral regurgitation.  6. Tricuspid valve regurgitation is mild-moderate.  7. The aortic valve is tricuspid. Mild calcification of the aortic valve.  No stenosis of the aortic valve.  8. The aorta is normal in  size and structure.  9. The inferior vena cava was normal in size with <50% respiratory  variability. PA systolic pressure 31 mmHg.  10. The patient was in atrial fibrillation.   Assessment and Plan:    Medication Adjustments/Labs and Tests Ordered: Current medicines are reviewed at length with the patient today.  Concerns regarding medicines are outlined above.   Tests Ordered: No orders of the defined types were placed in this encounter.   Medication Changes: No orders of the defined types were placed in this encounter.   Disposition:  Follow up {follow up:15908}  Signed, Satira Sark, MD, [facility] 02/20/2021 8:35 AM    [facility] at [place], [place], [place] [address] Phone: [phone]; Fax: [phone]

## 2021-03-12 DIAGNOSIS — Z66 Do not resuscitate: Secondary | ICD-10-CM | POA: Diagnosis not present

## 2021-03-12 DIAGNOSIS — K2100 Gastro-esophageal reflux disease with esophagitis, without bleeding: Secondary | ICD-10-CM | POA: Diagnosis not present

## 2021-03-12 DIAGNOSIS — N184 Chronic kidney disease, stage 4 (severe): Secondary | ICD-10-CM | POA: Diagnosis not present

## 2021-03-12 DIAGNOSIS — Z515 Encounter for palliative care: Secondary | ICD-10-CM | POA: Diagnosis not present

## 2021-03-12 DIAGNOSIS — M5442 Lumbago with sciatica, left side: Secondary | ICD-10-CM | POA: Diagnosis not present

## 2021-03-14 DIAGNOSIS — E7849 Other hyperlipidemia: Secondary | ICD-10-CM | POA: Diagnosis not present

## 2021-03-14 DIAGNOSIS — E1122 Type 2 diabetes mellitus with diabetic chronic kidney disease: Secondary | ICD-10-CM | POA: Diagnosis not present

## 2021-03-14 DIAGNOSIS — G301 Alzheimer's disease with late onset: Secondary | ICD-10-CM | POA: Diagnosis not present

## 2021-03-14 DIAGNOSIS — F0280 Dementia in other diseases classified elsewhere without behavioral disturbance: Secondary | ICD-10-CM | POA: Diagnosis not present

## 2021-03-14 DIAGNOSIS — I5032 Chronic diastolic (congestive) heart failure: Secondary | ICD-10-CM | POA: Diagnosis not present

## 2021-03-14 DIAGNOSIS — I130 Hypertensive heart and chronic kidney disease with heart failure and stage 1 through stage 4 chronic kidney disease, or unspecified chronic kidney disease: Secondary | ICD-10-CM | POA: Diagnosis not present

## 2021-03-14 DIAGNOSIS — N184 Chronic kidney disease, stage 4 (severe): Secondary | ICD-10-CM | POA: Diagnosis not present

## 2021-04-14 DIAGNOSIS — N184 Chronic kidney disease, stage 4 (severe): Secondary | ICD-10-CM | POA: Diagnosis not present

## 2021-04-14 DIAGNOSIS — F0280 Dementia in other diseases classified elsewhere without behavioral disturbance: Secondary | ICD-10-CM | POA: Diagnosis not present

## 2021-04-14 DIAGNOSIS — E1122 Type 2 diabetes mellitus with diabetic chronic kidney disease: Secondary | ICD-10-CM | POA: Diagnosis not present

## 2021-04-14 DIAGNOSIS — E7849 Other hyperlipidemia: Secondary | ICD-10-CM | POA: Diagnosis not present

## 2021-04-14 DIAGNOSIS — I130 Hypertensive heart and chronic kidney disease with heart failure and stage 1 through stage 4 chronic kidney disease, or unspecified chronic kidney disease: Secondary | ICD-10-CM | POA: Diagnosis not present

## 2021-04-14 DIAGNOSIS — G301 Alzheimer's disease with late onset: Secondary | ICD-10-CM | POA: Diagnosis not present

## 2021-04-14 DIAGNOSIS — I5032 Chronic diastolic (congestive) heart failure: Secondary | ICD-10-CM | POA: Diagnosis not present

## 2021-04-29 DIAGNOSIS — I69351 Hemiplegia and hemiparesis following cerebral infarction affecting right dominant side: Secondary | ICD-10-CM | POA: Diagnosis not present

## 2021-04-29 DIAGNOSIS — I4891 Unspecified atrial fibrillation: Secondary | ICD-10-CM | POA: Diagnosis not present

## 2021-04-29 DIAGNOSIS — F330 Major depressive disorder, recurrent, mild: Secondary | ICD-10-CM | POA: Diagnosis not present

## 2021-04-29 DIAGNOSIS — N183 Chronic kidney disease, stage 3 unspecified: Secondary | ICD-10-CM | POA: Diagnosis not present

## 2021-04-29 DIAGNOSIS — M5442 Lumbago with sciatica, left side: Secondary | ICD-10-CM | POA: Diagnosis not present

## 2021-04-29 DIAGNOSIS — F0390 Unspecified dementia without behavioral disturbance: Secondary | ICD-10-CM | POA: Diagnosis not present

## 2021-04-29 DIAGNOSIS — Z515 Encounter for palliative care: Secondary | ICD-10-CM | POA: Diagnosis not present

## 2021-04-29 DIAGNOSIS — J449 Chronic obstructive pulmonary disease, unspecified: Secondary | ICD-10-CM | POA: Diagnosis not present

## 2021-04-29 DIAGNOSIS — I509 Heart failure, unspecified: Secondary | ICD-10-CM | POA: Diagnosis not present

## 2021-05-15 DIAGNOSIS — I5032 Chronic diastolic (congestive) heart failure: Secondary | ICD-10-CM | POA: Diagnosis not present

## 2021-05-15 DIAGNOSIS — G301 Alzheimer's disease with late onset: Secondary | ICD-10-CM | POA: Diagnosis not present

## 2021-05-15 DIAGNOSIS — F0280 Dementia in other diseases classified elsewhere without behavioral disturbance: Secondary | ICD-10-CM | POA: Diagnosis not present

## 2021-05-15 DIAGNOSIS — N184 Chronic kidney disease, stage 4 (severe): Secondary | ICD-10-CM | POA: Diagnosis not present

## 2021-05-15 DIAGNOSIS — E1122 Type 2 diabetes mellitus with diabetic chronic kidney disease: Secondary | ICD-10-CM | POA: Diagnosis not present

## 2021-05-15 DIAGNOSIS — E7849 Other hyperlipidemia: Secondary | ICD-10-CM | POA: Diagnosis not present

## 2021-05-15 DIAGNOSIS — I130 Hypertensive heart and chronic kidney disease with heart failure and stage 1 through stage 4 chronic kidney disease, or unspecified chronic kidney disease: Secondary | ICD-10-CM | POA: Diagnosis not present

## 2021-05-27 DIAGNOSIS — E782 Mixed hyperlipidemia: Secondary | ICD-10-CM | POA: Diagnosis not present

## 2021-05-27 DIAGNOSIS — E1122 Type 2 diabetes mellitus with diabetic chronic kidney disease: Secondary | ICD-10-CM | POA: Diagnosis not present

## 2021-05-27 DIAGNOSIS — R5383 Other fatigue: Secondary | ICD-10-CM | POA: Diagnosis not present

## 2021-05-27 DIAGNOSIS — E0590 Thyrotoxicosis, unspecified without thyrotoxic crisis or storm: Secondary | ICD-10-CM | POA: Diagnosis not present

## 2021-05-27 DIAGNOSIS — Z23 Encounter for immunization: Secondary | ICD-10-CM | POA: Diagnosis not present

## 2021-05-27 DIAGNOSIS — E7849 Other hyperlipidemia: Secondary | ICD-10-CM | POA: Diagnosis not present

## 2021-05-27 DIAGNOSIS — N184 Chronic kidney disease, stage 4 (severe): Secondary | ICD-10-CM | POA: Diagnosis not present

## 2021-05-27 DIAGNOSIS — K2100 Gastro-esophageal reflux disease with esophagitis, without bleeding: Secondary | ICD-10-CM | POA: Diagnosis not present

## 2021-05-31 DIAGNOSIS — R569 Unspecified convulsions: Secondary | ICD-10-CM | POA: Diagnosis not present

## 2021-05-31 DIAGNOSIS — I10 Essential (primary) hypertension: Secondary | ICD-10-CM | POA: Diagnosis not present

## 2021-05-31 DIAGNOSIS — E7849 Other hyperlipidemia: Secondary | ICD-10-CM | POA: Diagnosis not present

## 2021-05-31 DIAGNOSIS — G301 Alzheimer's disease with late onset: Secondary | ICD-10-CM | POA: Diagnosis not present

## 2021-05-31 DIAGNOSIS — E1122 Type 2 diabetes mellitus with diabetic chronic kidney disease: Secondary | ICD-10-CM | POA: Diagnosis not present

## 2021-05-31 DIAGNOSIS — I69351 Hemiplegia and hemiparesis following cerebral infarction affecting right dominant side: Secondary | ICD-10-CM | POA: Diagnosis not present

## 2021-05-31 DIAGNOSIS — R4582 Worries: Secondary | ICD-10-CM | POA: Diagnosis not present

## 2021-05-31 DIAGNOSIS — D649 Anemia, unspecified: Secondary | ICD-10-CM | POA: Diagnosis not present

## 2021-06-05 DIAGNOSIS — R300 Dysuria: Secondary | ICD-10-CM | POA: Diagnosis not present

## 2021-06-14 DIAGNOSIS — F0280 Dementia in other diseases classified elsewhere without behavioral disturbance: Secondary | ICD-10-CM | POA: Diagnosis not present

## 2021-06-14 DIAGNOSIS — N184 Chronic kidney disease, stage 4 (severe): Secondary | ICD-10-CM | POA: Diagnosis not present

## 2021-06-14 DIAGNOSIS — E1122 Type 2 diabetes mellitus with diabetic chronic kidney disease: Secondary | ICD-10-CM | POA: Diagnosis not present

## 2021-06-14 DIAGNOSIS — E7849 Other hyperlipidemia: Secondary | ICD-10-CM | POA: Diagnosis not present

## 2021-06-14 DIAGNOSIS — I130 Hypertensive heart and chronic kidney disease with heart failure and stage 1 through stage 4 chronic kidney disease, or unspecified chronic kidney disease: Secondary | ICD-10-CM | POA: Diagnosis not present

## 2021-06-14 DIAGNOSIS — I5032 Chronic diastolic (congestive) heart failure: Secondary | ICD-10-CM | POA: Diagnosis not present

## 2021-06-14 DIAGNOSIS — G301 Alzheimer's disease with late onset: Secondary | ICD-10-CM | POA: Diagnosis not present

## 2021-07-15 DIAGNOSIS — E1122 Type 2 diabetes mellitus with diabetic chronic kidney disease: Secondary | ICD-10-CM | POA: Diagnosis not present

## 2021-07-15 DIAGNOSIS — G301 Alzheimer's disease with late onset: Secondary | ICD-10-CM | POA: Diagnosis not present

## 2021-07-15 DIAGNOSIS — I130 Hypertensive heart and chronic kidney disease with heart failure and stage 1 through stage 4 chronic kidney disease, or unspecified chronic kidney disease: Secondary | ICD-10-CM | POA: Diagnosis not present

## 2021-07-15 DIAGNOSIS — F0280 Dementia in other diseases classified elsewhere without behavioral disturbance: Secondary | ICD-10-CM | POA: Diagnosis not present

## 2021-07-15 DIAGNOSIS — N184 Chronic kidney disease, stage 4 (severe): Secondary | ICD-10-CM | POA: Diagnosis not present

## 2021-08-14 DIAGNOSIS — I5032 Chronic diastolic (congestive) heart failure: Secondary | ICD-10-CM | POA: Diagnosis not present

## 2021-08-14 DIAGNOSIS — E1122 Type 2 diabetes mellitus with diabetic chronic kidney disease: Secondary | ICD-10-CM | POA: Diagnosis not present

## 2021-08-14 DIAGNOSIS — G301 Alzheimer's disease with late onset: Secondary | ICD-10-CM | POA: Diagnosis not present

## 2021-08-14 DIAGNOSIS — E7849 Other hyperlipidemia: Secondary | ICD-10-CM | POA: Diagnosis not present

## 2021-08-14 DIAGNOSIS — F0280 Dementia in other diseases classified elsewhere without behavioral disturbance: Secondary | ICD-10-CM | POA: Diagnosis not present

## 2021-08-14 DIAGNOSIS — I130 Hypertensive heart and chronic kidney disease with heart failure and stage 1 through stage 4 chronic kidney disease, or unspecified chronic kidney disease: Secondary | ICD-10-CM | POA: Diagnosis not present

## 2021-08-14 DIAGNOSIS — N184 Chronic kidney disease, stage 4 (severe): Secondary | ICD-10-CM | POA: Diagnosis not present

## 2021-09-06 DIAGNOSIS — R300 Dysuria: Secondary | ICD-10-CM | POA: Diagnosis not present

## 2021-09-13 DIAGNOSIS — N184 Chronic kidney disease, stage 4 (severe): Secondary | ICD-10-CM | POA: Diagnosis not present

## 2021-09-13 DIAGNOSIS — E1122 Type 2 diabetes mellitus with diabetic chronic kidney disease: Secondary | ICD-10-CM | POA: Diagnosis not present

## 2021-09-13 DIAGNOSIS — I5032 Chronic diastolic (congestive) heart failure: Secondary | ICD-10-CM | POA: Diagnosis not present

## 2021-09-13 DIAGNOSIS — I130 Hypertensive heart and chronic kidney disease with heart failure and stage 1 through stage 4 chronic kidney disease, or unspecified chronic kidney disease: Secondary | ICD-10-CM | POA: Diagnosis not present

## 2021-09-13 DIAGNOSIS — E7849 Other hyperlipidemia: Secondary | ICD-10-CM | POA: Diagnosis not present

## 2021-09-13 DIAGNOSIS — F0280 Dementia in other diseases classified elsewhere without behavioral disturbance: Secondary | ICD-10-CM | POA: Diagnosis not present

## 2021-09-13 DIAGNOSIS — G301 Alzheimer's disease with late onset: Secondary | ICD-10-CM | POA: Diagnosis not present

## 2021-09-16 DIAGNOSIS — I69351 Hemiplegia and hemiparesis following cerebral infarction affecting right dominant side: Secondary | ICD-10-CM | POA: Diagnosis not present

## 2021-09-16 DIAGNOSIS — R569 Unspecified convulsions: Secondary | ICD-10-CM | POA: Diagnosis not present

## 2021-09-16 DIAGNOSIS — R4582 Worries: Secondary | ICD-10-CM | POA: Diagnosis not present

## 2021-09-16 DIAGNOSIS — I5032 Chronic diastolic (congestive) heart failure: Secondary | ICD-10-CM | POA: Diagnosis not present

## 2021-09-16 DIAGNOSIS — E1122 Type 2 diabetes mellitus with diabetic chronic kidney disease: Secondary | ICD-10-CM | POA: Diagnosis not present

## 2021-09-16 DIAGNOSIS — I10 Essential (primary) hypertension: Secondary | ICD-10-CM | POA: Diagnosis not present

## 2021-09-16 DIAGNOSIS — I482 Chronic atrial fibrillation, unspecified: Secondary | ICD-10-CM | POA: Diagnosis not present

## 2021-09-16 DIAGNOSIS — G301 Alzheimer's disease with late onset: Secondary | ICD-10-CM | POA: Diagnosis not present

## 2021-10-13 DIAGNOSIS — I130 Hypertensive heart and chronic kidney disease with heart failure and stage 1 through stage 4 chronic kidney disease, or unspecified chronic kidney disease: Secondary | ICD-10-CM | POA: Diagnosis not present

## 2021-10-13 DIAGNOSIS — I5032 Chronic diastolic (congestive) heart failure: Secondary | ICD-10-CM | POA: Diagnosis not present

## 2021-10-13 DIAGNOSIS — E1122 Type 2 diabetes mellitus with diabetic chronic kidney disease: Secondary | ICD-10-CM | POA: Diagnosis not present

## 2021-10-16 DIAGNOSIS — F0390 Unspecified dementia without behavioral disturbance: Secondary | ICD-10-CM | POA: Diagnosis not present

## 2021-10-16 DIAGNOSIS — I10 Essential (primary) hypertension: Secondary | ICD-10-CM | POA: Diagnosis not present

## 2021-10-16 DIAGNOSIS — R059 Cough, unspecified: Secondary | ICD-10-CM | POA: Diagnosis not present

## 2021-10-16 DIAGNOSIS — J9811 Atelectasis: Secondary | ICD-10-CM | POA: Diagnosis not present

## 2021-10-16 DIAGNOSIS — U099 Post covid-19 condition, unspecified: Secondary | ICD-10-CM | POA: Diagnosis not present

## 2021-10-16 DIAGNOSIS — R5081 Fever presenting with conditions classified elsewhere: Secondary | ICD-10-CM | POA: Diagnosis not present

## 2021-10-16 DIAGNOSIS — N1831 Chronic kidney disease, stage 3a: Secondary | ICD-10-CM | POA: Diagnosis not present

## 2021-10-16 DIAGNOSIS — R404 Transient alteration of awareness: Secondary | ICD-10-CM | POA: Diagnosis not present

## 2021-10-16 DIAGNOSIS — R0902 Hypoxemia: Secondary | ICD-10-CM | POA: Diagnosis not present

## 2021-10-16 DIAGNOSIS — I4891 Unspecified atrial fibrillation: Secondary | ICD-10-CM | POA: Diagnosis not present

## 2021-10-16 DIAGNOSIS — E1122 Type 2 diabetes mellitus with diabetic chronic kidney disease: Secondary | ICD-10-CM | POA: Diagnosis not present

## 2021-10-16 DIAGNOSIS — I129 Hypertensive chronic kidney disease with stage 1 through stage 4 chronic kidney disease, or unspecified chronic kidney disease: Secondary | ICD-10-CM | POA: Diagnosis not present

## 2021-10-16 DIAGNOSIS — R4182 Altered mental status, unspecified: Secondary | ICD-10-CM | POA: Diagnosis not present

## 2021-10-16 DIAGNOSIS — N390 Urinary tract infection, site not specified: Secondary | ICD-10-CM | POA: Diagnosis not present

## 2021-10-17 DIAGNOSIS — I482 Chronic atrial fibrillation, unspecified: Secondary | ICD-10-CM | POA: Diagnosis not present

## 2021-10-17 DIAGNOSIS — E1122 Type 2 diabetes mellitus with diabetic chronic kidney disease: Secondary | ICD-10-CM | POA: Diagnosis not present

## 2021-10-17 DIAGNOSIS — R569 Unspecified convulsions: Secondary | ICD-10-CM | POA: Diagnosis not present

## 2021-10-17 DIAGNOSIS — I5032 Chronic diastolic (congestive) heart failure: Secondary | ICD-10-CM | POA: Diagnosis not present

## 2021-10-17 DIAGNOSIS — I10 Essential (primary) hypertension: Secondary | ICD-10-CM | POA: Diagnosis not present

## 2021-10-17 DIAGNOSIS — D692 Other nonthrombocytopenic purpura: Secondary | ICD-10-CM | POA: Diagnosis not present

## 2021-10-17 DIAGNOSIS — J181 Lobar pneumonia, unspecified organism: Secondary | ICD-10-CM | POA: Diagnosis not present

## 2021-10-17 DIAGNOSIS — I69351 Hemiplegia and hemiparesis following cerebral infarction affecting right dominant side: Secondary | ICD-10-CM | POA: Diagnosis not present

## 2021-11-12 DIAGNOSIS — E1122 Type 2 diabetes mellitus with diabetic chronic kidney disease: Secondary | ICD-10-CM | POA: Diagnosis not present

## 2021-11-12 DIAGNOSIS — I5032 Chronic diastolic (congestive) heart failure: Secondary | ICD-10-CM | POA: Diagnosis not present

## 2021-11-21 DIAGNOSIS — D72829 Elevated white blood cell count, unspecified: Secondary | ICD-10-CM | POA: Diagnosis not present

## 2021-11-21 DIAGNOSIS — R6521 Severe sepsis with septic shock: Secondary | ICD-10-CM | POA: Diagnosis not present

## 2021-11-21 DIAGNOSIS — U071 COVID-19: Secondary | ICD-10-CM | POA: Diagnosis not present

## 2021-11-21 DIAGNOSIS — A419 Sepsis, unspecified organism: Secondary | ICD-10-CM | POA: Diagnosis not present

## 2021-11-21 DIAGNOSIS — I4891 Unspecified atrial fibrillation: Secondary | ICD-10-CM | POA: Diagnosis not present

## 2021-11-21 DIAGNOSIS — F331 Major depressive disorder, recurrent, moderate: Secondary | ICD-10-CM | POA: Diagnosis not present

## 2021-11-21 DIAGNOSIS — G9341 Metabolic encephalopathy: Secondary | ICD-10-CM | POA: Diagnosis not present

## 2021-11-21 DIAGNOSIS — I5023 Acute on chronic systolic (congestive) heart failure: Secondary | ICD-10-CM | POA: Diagnosis not present

## 2021-11-21 DIAGNOSIS — J189 Pneumonia, unspecified organism: Secondary | ICD-10-CM | POA: Diagnosis not present

## 2021-11-21 DIAGNOSIS — J9601 Acute respiratory failure with hypoxia: Secondary | ICD-10-CM | POA: Diagnosis not present

## 2021-11-21 DIAGNOSIS — R0602 Shortness of breath: Secondary | ICD-10-CM | POA: Diagnosis not present

## 2021-11-21 DIAGNOSIS — J690 Pneumonitis due to inhalation of food and vomit: Secondary | ICD-10-CM | POA: Diagnosis not present

## 2021-11-21 DIAGNOSIS — G8191 Hemiplegia, unspecified affecting right dominant side: Secondary | ICD-10-CM | POA: Diagnosis not present

## 2021-11-21 DIAGNOSIS — R059 Cough, unspecified: Secondary | ICD-10-CM | POA: Diagnosis not present

## 2021-11-21 DIAGNOSIS — N179 Acute kidney failure, unspecified: Secondary | ICD-10-CM | POA: Diagnosis not present

## 2021-11-21 DIAGNOSIS — I517 Cardiomegaly: Secondary | ICD-10-CM | POA: Diagnosis not present

## 2021-11-21 DIAGNOSIS — E1122 Type 2 diabetes mellitus with diabetic chronic kidney disease: Secondary | ICD-10-CM | POA: Diagnosis not present

## 2021-12-14 DEATH — deceased

## 2022-10-23 ENCOUNTER — Encounter (HOSPITAL_COMMUNITY): Payer: Self-pay | Admitting: *Deleted
